# Patient Record
Sex: Male | Born: 1945 | Race: White | Hispanic: No | Marital: Married | Smoking: Former smoker
Health system: Southern US, Community
[De-identification: ages and names within clinical notes are randomized; demographics above are authoritative.]

## PROBLEM LIST (undated history)

## (undated) DIAGNOSIS — J45909 Unspecified asthma, uncomplicated: Secondary | ICD-10-CM

## (undated) DIAGNOSIS — T7840XA Allergy, unspecified, initial encounter: Secondary | ICD-10-CM

## (undated) DIAGNOSIS — G4730 Sleep apnea, unspecified: Secondary | ICD-10-CM

## (undated) DIAGNOSIS — E785 Hyperlipidemia, unspecified: Secondary | ICD-10-CM

## (undated) DIAGNOSIS — I219 Acute myocardial infarction, unspecified: Secondary | ICD-10-CM

## (undated) DIAGNOSIS — J449 Chronic obstructive pulmonary disease, unspecified: Secondary | ICD-10-CM

## (undated) DIAGNOSIS — M1990 Unspecified osteoarthritis, unspecified site: Secondary | ICD-10-CM

## (undated) DIAGNOSIS — C801 Malignant (primary) neoplasm, unspecified: Secondary | ICD-10-CM

## (undated) DIAGNOSIS — I10 Essential (primary) hypertension: Secondary | ICD-10-CM

## (undated) DIAGNOSIS — J189 Pneumonia, unspecified organism: Secondary | ICD-10-CM

## (undated) DIAGNOSIS — Z87442 Personal history of urinary calculi: Secondary | ICD-10-CM

## (undated) DIAGNOSIS — I739 Peripheral vascular disease, unspecified: Secondary | ICD-10-CM

## (undated) DIAGNOSIS — K5792 Diverticulitis of intestine, part unspecified, without perforation or abscess without bleeding: Secondary | ICD-10-CM

## (undated) HISTORY — DX: Hyperlipidemia, unspecified: E78.5

## (undated) HISTORY — DX: Unspecified asthma, uncomplicated: J45.909

## (undated) HISTORY — PX: FOOT SURGERY: SHX648

## (undated) HISTORY — DX: Allergy, unspecified, initial encounter: T78.40XA

## (undated) HISTORY — PX: UPPER GASTROINTESTINAL ENDOSCOPY: SHX188

## (undated) HISTORY — PX: LAPAROSCOPIC TRANSABDOMINAL HERNIA: SHX5933

## (undated) HISTORY — PX: MANDIBLE SURGERY: SHX707

## (undated) HISTORY — DX: Sleep apnea, unspecified: G47.30

## (undated) HISTORY — DX: Acute myocardial infarction, unspecified: I21.9

## (undated) HISTORY — PX: COLON SURGERY: SHX602

## (undated) HISTORY — DX: Chronic obstructive pulmonary disease, unspecified: J44.9

## (undated) HISTORY — DX: Unspecified osteoarthritis, unspecified site: M19.90

## (undated) HISTORY — DX: Diverticulitis of intestine, part unspecified, without perforation or abscess without bleeding: K57.92

## (undated) HISTORY — DX: Essential (primary) hypertension: I10

## (undated) HISTORY — PX: CARDIAC CATHETERIZATION: SHX172

## (undated) HISTORY — PX: COLONOSCOPY: SHX174

## (undated) HISTORY — PX: JOINT REPLACEMENT: SHX530

## (undated) HISTORY — PX: OTHER SURGICAL HISTORY: SHX169

## (undated) HISTORY — PX: CORONARY STENT PLACEMENT: SHX1402

---

## 2016-07-11 DIAGNOSIS — R0789 Other chest pain: Secondary | ICD-10-CM | POA: Diagnosis not present

## 2016-07-11 DIAGNOSIS — K219 Gastro-esophageal reflux disease without esophagitis: Secondary | ICD-10-CM | POA: Diagnosis not present

## 2016-07-11 DIAGNOSIS — K319 Disease of stomach and duodenum, unspecified: Secondary | ICD-10-CM | POA: Diagnosis not present

## 2016-07-11 DIAGNOSIS — K2270 Barrett's esophagus without dysplasia: Secondary | ICD-10-CM | POA: Diagnosis not present

## 2016-07-11 DIAGNOSIS — R079 Chest pain, unspecified: Secondary | ICD-10-CM | POA: Diagnosis not present

## 2016-07-11 DIAGNOSIS — K2970 Gastritis, unspecified, without bleeding: Secondary | ICD-10-CM | POA: Diagnosis not present

## 2016-07-11 DIAGNOSIS — K22719 Barrett's esophagus with dysplasia, unspecified: Secondary | ICD-10-CM | POA: Diagnosis not present

## 2016-09-18 DIAGNOSIS — L570 Actinic keratosis: Secondary | ICD-10-CM | POA: Diagnosis not present

## 2016-09-18 DIAGNOSIS — L821 Other seborrheic keratosis: Secondary | ICD-10-CM | POA: Diagnosis not present

## 2016-09-18 DIAGNOSIS — L400 Psoriasis vulgaris: Secondary | ICD-10-CM | POA: Diagnosis not present

## 2016-11-07 DIAGNOSIS — L821 Other seborrheic keratosis: Secondary | ICD-10-CM | POA: Diagnosis not present

## 2016-11-07 DIAGNOSIS — L570 Actinic keratosis: Secondary | ICD-10-CM | POA: Diagnosis not present

## 2016-12-13 DIAGNOSIS — I2510 Atherosclerotic heart disease of native coronary artery without angina pectoris: Secondary | ICD-10-CM | POA: Diagnosis not present

## 2016-12-26 DIAGNOSIS — N401 Enlarged prostate with lower urinary tract symptoms: Secondary | ICD-10-CM | POA: Diagnosis not present

## 2016-12-26 DIAGNOSIS — N210 Calculus in bladder: Secondary | ICD-10-CM | POA: Diagnosis not present

## 2017-01-07 DIAGNOSIS — R05 Cough: Secondary | ICD-10-CM | POA: Diagnosis not present

## 2017-03-19 DIAGNOSIS — L821 Other seborrheic keratosis: Secondary | ICD-10-CM | POA: Diagnosis not present

## 2017-03-19 DIAGNOSIS — L570 Actinic keratosis: Secondary | ICD-10-CM | POA: Diagnosis not present

## 2017-07-30 DIAGNOSIS — N401 Enlarged prostate with lower urinary tract symptoms: Secondary | ICD-10-CM | POA: Diagnosis not present

## 2017-07-30 DIAGNOSIS — N210 Calculus in bladder: Secondary | ICD-10-CM | POA: Diagnosis not present

## 2017-09-24 DIAGNOSIS — K2270 Barrett's esophagus without dysplasia: Secondary | ICD-10-CM | POA: Diagnosis not present

## 2017-09-24 DIAGNOSIS — Z86010 Personal history of colonic polyps: Secondary | ICD-10-CM | POA: Diagnosis not present

## 2017-09-24 DIAGNOSIS — K219 Gastro-esophageal reflux disease without esophagitis: Secondary | ICD-10-CM | POA: Diagnosis not present

## 2017-10-02 DIAGNOSIS — R072 Precordial pain: Secondary | ICD-10-CM | POA: Diagnosis not present

## 2017-10-14 DIAGNOSIS — I2510 Atherosclerotic heart disease of native coronary artery without angina pectoris: Secondary | ICD-10-CM | POA: Diagnosis not present

## 2017-10-14 DIAGNOSIS — Z955 Presence of coronary angioplasty implant and graft: Secondary | ICD-10-CM | POA: Diagnosis not present

## 2017-10-14 DIAGNOSIS — Z79899 Other long term (current) drug therapy: Secondary | ICD-10-CM | POA: Diagnosis not present

## 2017-10-14 DIAGNOSIS — J45909 Unspecified asthma, uncomplicated: Secondary | ICD-10-CM | POA: Diagnosis not present

## 2017-10-14 DIAGNOSIS — Z7982 Long term (current) use of aspirin: Secondary | ICD-10-CM | POA: Diagnosis not present

## 2017-10-14 DIAGNOSIS — R079 Chest pain, unspecified: Secondary | ICD-10-CM | POA: Diagnosis not present

## 2017-10-14 DIAGNOSIS — R0789 Other chest pain: Secondary | ICD-10-CM | POA: Diagnosis not present

## 2017-10-14 DIAGNOSIS — E782 Mixed hyperlipidemia: Secondary | ICD-10-CM | POA: Diagnosis not present

## 2017-10-14 DIAGNOSIS — R05 Cough: Secondary | ICD-10-CM | POA: Diagnosis not present

## 2017-10-14 DIAGNOSIS — Z87891 Personal history of nicotine dependence: Secondary | ICD-10-CM | POA: Diagnosis not present

## 2017-10-17 DIAGNOSIS — K219 Gastro-esophageal reflux disease without esophagitis: Secondary | ICD-10-CM | POA: Diagnosis not present

## 2017-10-17 DIAGNOSIS — Z79899 Other long term (current) drug therapy: Secondary | ICD-10-CM | POA: Diagnosis not present

## 2017-10-17 DIAGNOSIS — I2510 Atherosclerotic heart disease of native coronary artery without angina pectoris: Secondary | ICD-10-CM | POA: Diagnosis not present

## 2017-10-17 DIAGNOSIS — K2270 Barrett's esophagus without dysplasia: Secondary | ICD-10-CM | POA: Diagnosis not present

## 2017-10-17 DIAGNOSIS — E782 Mixed hyperlipidemia: Secondary | ICD-10-CM | POA: Diagnosis not present

## 2017-10-17 DIAGNOSIS — Z87891 Personal history of nicotine dependence: Secondary | ICD-10-CM | POA: Diagnosis not present

## 2017-10-17 DIAGNOSIS — K210 Gastro-esophageal reflux disease with esophagitis: Secondary | ICD-10-CM | POA: Diagnosis not present

## 2017-10-17 DIAGNOSIS — J45909 Unspecified asthma, uncomplicated: Secondary | ICD-10-CM | POA: Diagnosis not present

## 2017-10-17 DIAGNOSIS — K2950 Unspecified chronic gastritis without bleeding: Secondary | ICD-10-CM | POA: Diagnosis not present

## 2017-12-02 DIAGNOSIS — G4733 Obstructive sleep apnea (adult) (pediatric): Secondary | ICD-10-CM | POA: Diagnosis not present

## 2017-12-02 DIAGNOSIS — K2270 Barrett's esophagus without dysplasia: Secondary | ICD-10-CM | POA: Diagnosis not present

## 2017-12-02 DIAGNOSIS — J45909 Unspecified asthma, uncomplicated: Secondary | ICD-10-CM | POA: Diagnosis not present

## 2017-12-19 DIAGNOSIS — G4733 Obstructive sleep apnea (adult) (pediatric): Secondary | ICD-10-CM | POA: Diagnosis not present

## 2017-12-19 DIAGNOSIS — R0683 Snoring: Secondary | ICD-10-CM | POA: Diagnosis not present

## 2017-12-19 DIAGNOSIS — I491 Atrial premature depolarization: Secondary | ICD-10-CM | POA: Diagnosis not present

## 2017-12-19 DIAGNOSIS — G4761 Periodic limb movement disorder: Secondary | ICD-10-CM | POA: Diagnosis not present

## 2017-12-19 DIAGNOSIS — G4710 Hypersomnia, unspecified: Secondary | ICD-10-CM | POA: Diagnosis not present

## 2017-12-19 DIAGNOSIS — G478 Other sleep disorders: Secondary | ICD-10-CM | POA: Diagnosis not present

## 2017-12-26 DIAGNOSIS — G4761 Periodic limb movement disorder: Secondary | ICD-10-CM | POA: Diagnosis not present

## 2017-12-26 DIAGNOSIS — R0683 Snoring: Secondary | ICD-10-CM | POA: Diagnosis not present

## 2017-12-26 DIAGNOSIS — G4733 Obstructive sleep apnea (adult) (pediatric): Secondary | ICD-10-CM | POA: Diagnosis not present

## 2017-12-26 DIAGNOSIS — G478 Other sleep disorders: Secondary | ICD-10-CM | POA: Diagnosis not present

## 2018-01-02 DIAGNOSIS — G478 Other sleep disorders: Secondary | ICD-10-CM | POA: Diagnosis not present

## 2018-01-02 DIAGNOSIS — I491 Atrial premature depolarization: Secondary | ICD-10-CM | POA: Diagnosis not present

## 2018-01-02 DIAGNOSIS — G4733 Obstructive sleep apnea (adult) (pediatric): Secondary | ICD-10-CM | POA: Diagnosis not present

## 2018-01-02 DIAGNOSIS — G4710 Hypersomnia, unspecified: Secondary | ICD-10-CM | POA: Diagnosis not present

## 2018-01-02 DIAGNOSIS — G4761 Periodic limb movement disorder: Secondary | ICD-10-CM | POA: Diagnosis not present

## 2018-01-02 DIAGNOSIS — R0683 Snoring: Secondary | ICD-10-CM | POA: Diagnosis not present

## 2018-01-08 DIAGNOSIS — G4761 Periodic limb movement disorder: Secondary | ICD-10-CM | POA: Diagnosis not present

## 2018-01-08 DIAGNOSIS — G4733 Obstructive sleep apnea (adult) (pediatric): Secondary | ICD-10-CM | POA: Diagnosis not present

## 2018-01-08 DIAGNOSIS — G478 Other sleep disorders: Secondary | ICD-10-CM | POA: Diagnosis not present

## 2018-01-08 DIAGNOSIS — I491 Atrial premature depolarization: Secondary | ICD-10-CM | POA: Diagnosis not present

## 2018-01-27 DIAGNOSIS — E669 Obesity, unspecified: Secondary | ICD-10-CM | POA: Diagnosis not present

## 2018-01-27 DIAGNOSIS — I10 Essential (primary) hypertension: Secondary | ICD-10-CM | POA: Diagnosis not present

## 2018-01-27 DIAGNOSIS — E782 Mixed hyperlipidemia: Secondary | ICD-10-CM | POA: Diagnosis not present

## 2018-01-27 DIAGNOSIS — I2510 Atherosclerotic heart disease of native coronary artery without angina pectoris: Secondary | ICD-10-CM | POA: Diagnosis not present

## 2018-02-03 DIAGNOSIS — R972 Elevated prostate specific antigen [PSA]: Secondary | ICD-10-CM | POA: Diagnosis not present

## 2018-02-03 DIAGNOSIS — N401 Enlarged prostate with lower urinary tract symptoms: Secondary | ICD-10-CM | POA: Diagnosis not present

## 2018-02-03 DIAGNOSIS — N210 Calculus in bladder: Secondary | ICD-10-CM | POA: Diagnosis not present

## 2018-02-11 DIAGNOSIS — K2270 Barrett's esophagus without dysplasia: Secondary | ICD-10-CM | POA: Diagnosis not present

## 2018-02-11 DIAGNOSIS — I2510 Atherosclerotic heart disease of native coronary artery without angina pectoris: Secondary | ICD-10-CM | POA: Diagnosis not present

## 2018-02-11 DIAGNOSIS — G4733 Obstructive sleep apnea (adult) (pediatric): Secondary | ICD-10-CM | POA: Diagnosis not present

## 2018-02-11 DIAGNOSIS — J45909 Unspecified asthma, uncomplicated: Secondary | ICD-10-CM | POA: Diagnosis not present

## 2018-03-12 DIAGNOSIS — G4733 Obstructive sleep apnea (adult) (pediatric): Secondary | ICD-10-CM | POA: Diagnosis not present

## 2018-03-12 DIAGNOSIS — I2510 Atherosclerotic heart disease of native coronary artery without angina pectoris: Secondary | ICD-10-CM | POA: Diagnosis not present

## 2018-03-12 DIAGNOSIS — K2270 Barrett's esophagus without dysplasia: Secondary | ICD-10-CM | POA: Diagnosis not present

## 2018-03-12 DIAGNOSIS — J45909 Unspecified asthma, uncomplicated: Secondary | ICD-10-CM | POA: Diagnosis not present

## 2018-06-19 DIAGNOSIS — G4710 Hypersomnia, unspecified: Secondary | ICD-10-CM | POA: Diagnosis not present

## 2018-06-19 DIAGNOSIS — G4761 Periodic limb movement disorder: Secondary | ICD-10-CM | POA: Diagnosis not present

## 2018-06-19 DIAGNOSIS — G4733 Obstructive sleep apnea (adult) (pediatric): Secondary | ICD-10-CM | POA: Diagnosis not present

## 2018-06-24 DIAGNOSIS — G4761 Periodic limb movement disorder: Secondary | ICD-10-CM | POA: Diagnosis not present

## 2018-06-24 DIAGNOSIS — G4733 Obstructive sleep apnea (adult) (pediatric): Secondary | ICD-10-CM | POA: Diagnosis not present

## 2018-09-23 ENCOUNTER — Ambulatory Visit (INDEPENDENT_AMBULATORY_CARE_PROVIDER_SITE_OTHER): Payer: Federal, State, Local not specified - PPO | Admitting: Family Medicine

## 2018-09-23 ENCOUNTER — Encounter: Payer: Self-pay | Admitting: Family Medicine

## 2018-09-23 DIAGNOSIS — J4540 Moderate persistent asthma, uncomplicated: Secondary | ICD-10-CM

## 2018-09-23 DIAGNOSIS — N401 Enlarged prostate with lower urinary tract symptoms: Secondary | ICD-10-CM | POA: Diagnosis not present

## 2018-09-23 DIAGNOSIS — J45909 Unspecified asthma, uncomplicated: Secondary | ICD-10-CM | POA: Insufficient documentation

## 2018-09-23 DIAGNOSIS — I2510 Atherosclerotic heart disease of native coronary artery without angina pectoris: Secondary | ICD-10-CM | POA: Diagnosis not present

## 2018-09-23 DIAGNOSIS — G4733 Obstructive sleep apnea (adult) (pediatric): Secondary | ICD-10-CM

## 2018-09-23 DIAGNOSIS — I10 Essential (primary) hypertension: Secondary | ICD-10-CM | POA: Diagnosis not present

## 2018-09-23 DIAGNOSIS — N400 Enlarged prostate without lower urinary tract symptoms: Secondary | ICD-10-CM | POA: Insufficient documentation

## 2018-09-23 MED ORDER — OMEPRAZOLE MAGNESIUM 20 MG PO TBEC: 40.0000 mg | DELAYED_RELEASE_TABLET | Freq: Every day | ORAL | 1 refills | Status: DC

## 2018-09-23 MED ORDER — ALBUTEROL SULFATE HFA 108 (90 BASE) MCG/ACT IN AERS: 1.0000 | INHALATION_SPRAY | Freq: Four times a day (QID) | RESPIRATORY_TRACT | 12 refills | Status: DC | PRN

## 2018-09-23 MED ORDER — FLUTICASONE FUROATE-VILANTEROL 100-25 MCG/INH IN AEPB: 1.0000 | INHALATION_SPRAY | Freq: Every day | RESPIRATORY_TRACT | 12 refills | Status: DC

## 2018-09-23 MED ORDER — ROSUVASTATIN CALCIUM 20 MG PO TABS: 20.0000 mg | ORAL_TABLET | Freq: Every day | ORAL | 1 refills | Status: DC

## 2018-09-23 MED ORDER — MONTELUKAST SODIUM 10 MG PO TABS: 10.0000 mg | ORAL_TABLET | Freq: Every day | ORAL | 1 refills | Status: DC

## 2018-09-23 MED ORDER — METOPROLOL TARTRATE 25 MG PO TABS: 25.0000 mg | ORAL_TABLET | Freq: Two times a day (BID) | ORAL | 1 refills | Status: DC

## 2018-09-23 MED ORDER — FINASTERIDE 5 MG PO TABS: 5.0000 mg | ORAL_TABLET | Freq: Every day | ORAL | 1 refills | Status: DC

## 2018-09-23 MED ORDER — LISINOPRIL 5 MG PO TABS: 5.0000 mg | ORAL_TABLET | Freq: Every day | ORAL | 1 refills | Status: DC

## 2018-09-23 MED ORDER — NITROGLYCERIN 0.4 MG SL SUBL: 0.4000 mg | SUBLINGUAL_TABLET | SUBLINGUAL | 0 refills | Status: DC | PRN

## 2018-09-23 MED ORDER — TAMSULOSIN HCL 0.4 MG PO CAPS: 0.4000 mg | ORAL_CAPSULE | Freq: Every day | ORAL | 1 refills | Status: DC

## 2018-09-23 NOTE — Assessment & Plan Note (Signed)
-  Needs referral to pulmonology for equipment

## 2018-09-23 NOTE — Assessment & Plan Note (Signed)
-  BP well controlled with current medications, will plan to continue -Continue healthy lifestyle with regular exercise.

## 2018-09-23 NOTE — Assessment & Plan Note (Addendum)
-  Stable, denies anginal symptoms.  -Continue statin, asa and beta blocker.  -Requests to establish with cardiology here, referral placed.

## 2018-09-23 NOTE — Assessment & Plan Note (Signed)
-  Reports recent psa  Of 1.8 -Requests referral to local urologist to discuss surgical options for mgmt of bph.

## 2018-09-23 NOTE — Progress Notes (Signed)
Alex Boyle - 73 y.o. male MRN [id number]  Date of birth: 12-30-1945   This visit type was conducted due to national recommendations for restrictions regarding the COVID-19 Pandemic (e.g. social distancing).  This format is felt to be most appropriate for this patient at this time.  All issues noted in this document were discussed and addressed.  No physical exam was performed (except for noted visual exam findings with Video Visits).  I discussed the limitations of evaluation and management by telemedicine and the availability of in person appointments. The patient expressed understanding and agreed to proceed.  I connected with@ on 09/23/18 at  8:30 AM EDT by a video enabled telemedicine application and verified that I am speaking with the correct person using two identifiers.   Patient Location: [facility] [place] [address]   Provider location:   Home office  Chief Complaint  Patient presents with  . Establish Care    Refill Meds- HTN/BPH/Asthma    HPI  Alex Boyle is a 73 y.o. male who presents via audio/video conferencing for a telehealth visit today.  He is presenting for initial visit with pcp.  He recently relocated to this area from [place].  He has a history of HTN, CAD with history of MI s/p stenting x2, diverticulitis, BPH and asthma. He was being followed by cardiology, pulmonology and urology while living in [facility] and would like to establish with specialists here. He is needing medication refills as well.   -HTN:  Currently managed with lisinopril and metoprolol.  He is doing well with current medication and denies side effects.  He exercises regularly and does yoga.    -CAD:  History of MI, denies any recent anginal symptoms.  He is on statin and asa.  BP has been well controlled.   -BPH:  Current tx with finasteride and tamsulosin.  Urolift procedure discussed prior to moving to [place].  Would like to see urologist here to discuss surgical  options as well.  Symptoms are fairly well managed with current medications.   -Asthma: Using breo, singulair and albuterol.  Symptoms well managed with combination of medications.  Also has OSA, was working with pulmonologist on getting this treated but may need BiPAP, needs referral back to pulm.     ROS:  A comprehensive ROS was completed and negative except as noted per HPI  Past Medical History:  Diagnosis Date  . Allergy   . Arthritis   . Asthma   . Diverticulitis   . Hyperlipidemia   . Hypertension   . Myocardial infarction ([place])   . Sleep apnea   . Sleep apnea in adult     Past Surgical History:  Procedure Laterality Date  . COLON SURGERY    . CORONARY STENT PLACEMENT     x2  . JOINT REPLACEMENT     knee  . LAPAROSCOPIC TRANSABDOMINAL HERNIA    . MANDIBLE SURGERY    . skin cancer     basil cell removed on back    Family History  Adopted: Yes  Problem Relation Age of Onset  . Heart disease Mother   . Hypertension Mother   . Arthritis Mother   . Alzheimer's disease Mother     Social History   Socioeconomic History  . Marital status: Married    Spouse name: Not on file  . Number of children: Not on file  . Years of education: Not on file  . Highest education level: Not on  file  Occupational History  . Not on file  Social Needs  . Financial resource strain: Not hard at all  . Food insecurity:    Worry: Never true    Inability: Never true  . Transportation needs:    Medical: No    Non-medical: No  Tobacco Use  . Smoking status: Never Smoker  . Smokeless tobacco: Never Used  Substance and Sexual Activity  . Alcohol use: Yes    Comment: soically   . Drug use: Never  . Sexual activity: Yes    Birth control/protection: None  Lifestyle  . Physical activity:    Days per week: 4 days    Minutes per session: 30 min  . Stress: Not at all  Relationships  . Social connections:    Talks on phone: Not on file    Gets together: Not on file    Attends  religious service: Not on file    Active member of club or organization: Not on file    Attends meetings of clubs or organizations: Not on file    Relationship status: Not on file  . Intimate partner violence:    Fear of current or ex partner: Not on file    Emotionally abused: Not on file    Physically abused: Not on file    Forced sexual activity: Not on file  Other Topics Concern  . Not on file  Social History Narrative  . Not on file    No current outpatient medications on file.  EXAM:  VITALS per patient if applicable: BP 329/92 Comment: pt reports from hm  Temp (!) 96.2 F (35.7 C) (Oral) Comment (Src): pt reports from hm.  Ht 6' (1.829 m)   Wt 222 lb (100.7 kg) Comment: pt reports from hm  BMI 30.11 kg/m   GENERAL: alert, oriented, appears well and in no acute distress  HEENT: atraumatic, conjunttiva clear, no obvious abnormalities on inspection of external nose and ears  NECK: normal movements of the head and neck  LUNGS: on inspection no signs of respiratory distress, breathing rate appears normal, no obvious gross SOB, gasping or wheezing  CV: no obvious cyanosis  MS: moves all visible extremities without noticeable abnormality  PSYCH/NEURO: pleasant and cooperative, no obvious depression or anxiety, speech and thought processing grossly intact  ASSESSMENT AND PLAN:  Discussed the following assessment and plan:  HTN (hypertension) -BP well controlled with current medications, will plan to continue -Continue healthy lifestyle with regular exercise.   CAD (coronary artery disease) -Stable, denies anginal symptoms.  -Continue statin, asa and beta blocker.  -Requests to establish with cardiology here, referral placed.   Asthma -Well controlled, continue current medications.    OSA (obstructive sleep apnea) -Needs referral to pulmonology for equipment  BPH (benign prostatic hyperplasia) -Reports recent psa  Of 1.8 -Requests referral to local  urologist to discuss surgical options for mgmt of bph.        I discussed the assessment and treatment plan with the patient. The patient was provided an opportunity to ask questions and all were answered. The patient agreed with the plan and demonstrated an understanding of the instructions.   The patient was advised to call back or seek an in-person evaluation if the symptoms worsen or if the condition fails to improve as anticipated.     Luetta Nutting, DO

## 2018-09-23 NOTE — Assessment & Plan Note (Signed)
Well-controlled, continue current medications

## 2018-09-30 ENCOUNTER — Telehealth: Payer: Self-pay | Admitting: General Practice

## 2018-09-30 NOTE — Telephone Encounter (Signed)
error 

## 2018-10-06 ENCOUNTER — Ambulatory Visit: Payer: Self-pay | Admitting: Family Medicine

## 2018-10-07 ENCOUNTER — Ambulatory Visit: Payer: Federal, State, Local not specified - PPO | Admitting: Family Medicine

## 2018-10-10 ENCOUNTER — Telehealth: Payer: Self-pay

## 2018-10-10 NOTE — Telephone Encounter (Signed)

## 2018-10-13 ENCOUNTER — Telehealth (INDEPENDENT_AMBULATORY_CARE_PROVIDER_SITE_OTHER): Payer: Federal, State, Local not specified - PPO | Admitting: Cardiology

## 2018-10-13 ENCOUNTER — Other Ambulatory Visit: Payer: Self-pay

## 2018-10-13 VITALS — BP 116/69 | HR 71 | Ht 72.0 in

## 2018-10-13 DIAGNOSIS — E785 Hyperlipidemia, unspecified: Secondary | ICD-10-CM | POA: Diagnosis not present

## 2018-10-13 DIAGNOSIS — I10 Essential (primary) hypertension: Secondary | ICD-10-CM

## 2018-10-13 DIAGNOSIS — I2510 Atherosclerotic heart disease of native coronary artery without angina pectoris: Secondary | ICD-10-CM

## 2018-10-13 MED ORDER — ASPIRIN EC 81 MG PO TBEC: 81.0000 mg | DELAYED_RELEASE_TABLET | Freq: Every day | ORAL | 3 refills | Status: DC

## 2018-10-13 NOTE — Patient Instructions (Addendum)
Medication Instructions:  1) RESTART ASPIRIN 81 mg daily     Labwork: Your provider recommends that you return for FASTING lab work in June.  Follow-Up: Your provider wants you to follow-up in: 6 months with Dr. Radford Pax or her assistant. You will receive a reminder letter in the mail two months in advance. If you don't receive a letter, please call our office to schedule the follow-up appointment.

## 2018-10-13 NOTE — Progress Notes (Signed)
Virtual Visit via Video Note   This visit type was conducted due to national recommendations for restrictions regarding the COVID-19 Pandemic (e.g. social distancing) in an effort to limit this patient's exposure and mitigate transmission in our community.  Due to his co-morbid illnesses, this patient is at least at moderate risk for complications without adequate follow up.  This format is felt to be most appropriate for this patient at this time.  All issues noted in this document were discussed and addressed.  A limited physical exam was performed with this format.  Please refer to the patient's chart for his consent to telehealth for [facility].   Evaluation Performed:  Cardiology Consult  This visit type was conducted due to national recommendations for restrictions regarding the COVID-19 Pandemic (e.g. social distancing).  This format is felt to be most appropriate for this patient at this time.  All issues noted in this document were discussed and addressed.  No physical exam was performed (except for noted visual exam findings with Video Visits).  Please refer to the patient's chart (MyChart message for video visits and phone note for telephone visits) for the patient's consent to telehealth for [facility]  Date:  10/13/2018   ID:  Alex Boyle, DOB 1946/01/18, MRN [id number]  Patient Location:  Home  Provider location:   [place]  PCP:  Luetta Nutting, DO  Cardiologist:  Fransico Him, MD Electrophysiologist:  None   Chief Complaint:  CAD  History of Present Illness:    Alex Boyle is a 73 y.o. male who presents via audio/video conferencing for a telehealth visit today in referral by Dr. Luetta Nutting for establishing care with Cardiology for CAD.  THis is a 73yo male with a history of ASCAD s/p remote MI with PCI x 2, HTN, hyperlipidemia, OSA and asthma.  He recently moved here from [facility].  He is s/p remote MI in 2012 at which time he had total occlusion of the  LAD and underwent PCI.  He was also noted to have high-grade lesion in his left circumflex but was medically managed.  Several months later he had a follow-up nuclear stress test and collapsed on the treadmill.  He underwent PCI of the left circumflex.  His last cath was October 2016 which showed widely patent stents and no other CAD.  He is here today for followup and is doing well.  He denies any chest pain or pressure, SOB, DOE, PND, orthopnea, dizziness, palpitations or syncope.  He occasionally has some mild lower extremity edema he is compliant with his meds and is tolerating meds with no SE.    The patient does not have symptoms concerning for COVID-19 infection (fever, chills, cough, or new shortness of breath).    Prior CV studies:   The following studies were reviewed today:  none  Past Medical History:  Diagnosis Date  . Allergy   . Arthritis   . Asthma   . Diverticulitis   . Hyperlipidemia   . Hypertension   . Myocardial infarction ([place])   . Sleep apnea   . Sleep apnea in adult    Past Surgical History:  Procedure Laterality Date  . COLON SURGERY    . CORONARY STENT PLACEMENT     x2  . JOINT REPLACEMENT     knee  . LAPAROSCOPIC TRANSABDOMINAL HERNIA    . MANDIBLE SURGERY    . skin cancer     basil cell removed on back     Current Meds  Medication Sig  . albuterol (PROVENTIL HFA;VENTOLIN HFA) 108 (90 Base) MCG/ACT inhaler Inhale 1-2 puffs into the lungs every 6 (six) hours as needed for wheezing or shortness of breath.  . finasteride (PROSCAR) 5 MG tablet Take 1 tablet (5 mg total) by mouth daily.  . fluticasone furoate-vilanterol (BREO ELLIPTA) 100-25 MCG/INH AEPB Inhale 1 puff into the lungs daily.  Marland Kitchen lisinopril (PRINIVIL,ZESTRIL) 5 MG tablet Take 1 tablet (5 mg total) by mouth daily.  . metoprolol tartrate (LOPRESSOR) 25 MG tablet Take 1 tablet (25 mg total) by mouth 2 (two) times daily.  . montelukast (SINGULAIR) 10 MG tablet Take 1 tablet (10 mg total) by  mouth at bedtime.  . nitroGLYCERIN (NITROSTAT) 0.4 MG SL tablet Place 1 tablet (0.4 mg total) under the tongue every 5 (five) minutes as needed for chest pain (If no relief after 3 doses call 911).  Marland Kitchen omeprazole (PRILOSEC OTC) 20 MG tablet Take 2 tablets (40 mg total) by mouth daily.  . rosuvastatin (CRESTOR) 20 MG tablet Take 1 tablet (20 mg total) by mouth daily.  . tamsulosin (FLOMAX) 0.4 MG CAPS capsule Take 1 capsule (0.4 mg total) by mouth daily.     Allergies:   Codeine and Penicillins   Social History   Tobacco Use  . Smoking status: Never Smoker  . Smokeless tobacco: Never Used  Substance Use Topics  . Alcohol use: Yes    Comment: soically   . Drug use: Never     Family Hx: The patient's family history includes Alzheimer's disease in his mother; Arthritis in his mother; Heart disease in his mother; Hypertension in his mother. He was adopted.  ROS:   Please see the history of present illness.     All other systems reviewed and are negative.   Labs/Other Tests and Data Reviewed:    Recent Labs: No results found for requested labs within last 8760 hours.   Recent Lipid Panel No results found for: CHOL, TRIG, HDL, CHOLHDL, LDLCALC, LDLDIRECT  Wt Readings from Last 3 Encounters:  09/23/18 222 lb (100.7 kg)     Objective:    Vital Signs:  BP 116/69   Pulse 71   Ht 6' (1.829 m)   BMI 30.11 kg/m    CONSTITUTIONAL:  Well nourished, well developed male in no acute distress.  EYES: anicteric MOUTH: oral mucosa is pink RESPIRATORY: Normal respiratory effort, symmetric expansion CARDIOVASCULAR: No peripheral edema SKIN: No rash, lesions or ulcers MUSCULOSKELETAL: no digital cyanosis NEURO: Cranial Nerves II-XII grossly intact, moves all extremities PSYCH: Intact judgement and insight.  A&O x 3, Mood/affect appropriate   ASSESSMENT & PLAN:    1.  ASCAD -he is status post remote MI in 2012 at which time he had total occlusion of the LAD and underwent PCI.  He  was also noted to have high-grade lesion in his left circumflex but was medically managed.  Several months later he had a follow-up nuclear stress test and collapsed on the treadmill.  He underwent PCI of the left circumflex.  His last cath was October 2016 which showed widely patent stents and no other CAD.  He has no anginal symptoms at present.  He had stopped taking aspirin a year ago because of bruising but I recommended he restart a baby aspirin 81 mg daily.  He will continue on statin and beta-blocker.  2.  Hypertension -his blood pressure is well controlled on exam today.  He will continue on Lopressor 25 mg twice daily and lisinopril 5  mg daily.  I will check a BMET  in June.  3.  Hyperlipidemia - His LDL goal is < 70.  He has not had an fasting lipid panel done in over a year.  I will check an FLP and ALT in June.  He will continue on Crestor 20 mg daily.  4.  COVID-19 Education:The signs and symptoms of COVID-19 were discussed with the patient and how to seek care for testing (follow up with PCP or arrange E-visit).  The importance of social distancing was discussed today.  Patient Risk:   After full review of this patient's clinical status, I feel that they are at least moderate risk at this time.  Time:   Today, I have spent 20 minutes directly with the patient on video discussing medical problems including CAD, HTN, lipids.  We also reviewed the symptoms of COVID 19 and the ways to protect against contracting the virus with telehealth technology.  I spent an additional 5 minutes reviewing patient's chart including OV notes from PCP.  Medication Adjustments/Labs and Tests Ordered: Current medicines are reviewed at length with the patient today.  Concerns regarding medicines are outlined above.  Tests Ordered: No orders of the defined types were placed in this encounter.  Medication Changes: No orders of the defined types were placed in this encounter.   Disposition:  Follow up in  6 month(s)  Signed, Fransico Him, MD  10/13/2018 8:03 AM    [facility]

## 2018-10-30 ENCOUNTER — Telehealth: Payer: Self-pay | Admitting: Family Medicine

## 2018-10-30 NOTE — Telephone Encounter (Signed)
Called and could not leave a vm. Calling to schedule patient for lab appt.

## 2018-10-30 NOTE — Telephone Encounter (Signed)
Copied from [employer] [id number]. Topic: General - Inquiry >> Oct 29, 2018  1:19 PM Mathis Bud wrote: Reason for CRM: Patient would like to see when he can come in for his labs.  He saw PCP 4/14, patient states would like to move forward if patients are coming to the office.  Call back # [phone] >> Oct 29, 2018  1:23 PM Noitamyae, Phetcharat, LPN wrote: pls help call the pt and schedule lab, order in system form Dr. Zigmund Daniel already.

## 2018-10-30 NOTE — Addendum Note (Signed)
Addended by: Lynnea Ferrier on: 10/30/2018 01:42 PM   Modules accepted: Orders

## 2018-10-31 ENCOUNTER — Other Ambulatory Visit (INDEPENDENT_AMBULATORY_CARE_PROVIDER_SITE_OTHER): Payer: Federal, State, Local not specified - PPO

## 2018-10-31 DIAGNOSIS — I10 Essential (primary) hypertension: Secondary | ICD-10-CM | POA: Diagnosis not present

## 2018-10-31 DIAGNOSIS — I2510 Atherosclerotic heart disease of native coronary artery without angina pectoris: Secondary | ICD-10-CM | POA: Diagnosis not present

## 2018-11-01 LAB — CBC WITH DIFFERENTIAL/PLATELET
Absolute Monocytes: 587 cells/uL (ref 200–950)
Basophils Absolute: 41 cells/uL (ref 0–200)
Basophils Relative: 0.6 %
Eosinophils Absolute: 207 cells/uL (ref 15–500)
Eosinophils Relative: 3 %
HCT: 48 % (ref 38.5–50.0)
Hemoglobin: 16.4 g/dL (ref 13.2–17.1)
Lymphs Abs: 1932 cells/uL (ref 850–3900)
MCH: 30.1 pg (ref 27.0–33.0)
MCHC: 34.2 g/dL (ref 32.0–36.0)
MCV: 88.2 fL (ref 80.0–100.0)
MPV: 10.6 fL (ref 7.5–12.5)
Monocytes Relative: 8.5 %
Neutro Abs: 4133 cells/uL (ref 1500–7800)
Neutrophils Relative %: 59.9 %
Platelets: 193 10*3/uL (ref 140–400)
RBC: 5.44 10*6/uL (ref 4.20–5.80)
RDW: 13.2 % (ref 11.0–15.0)
Total Lymphocyte: 28 %
WBC: 6.9 10*3/uL (ref 3.8–10.8)

## 2018-11-01 LAB — HEPATIC FUNCTION PANEL
AG Ratio: 1.7 (calc) (ref 1.0–2.5)
ALT: 30 U/L (ref 9–46)
AST: 24 U/L (ref 10–35)
Albumin: 4.4 g/dL (ref 3.6–5.1)
Alkaline phosphatase (APISO): 59 U/L (ref 35–144)
Bilirubin, Direct: 0.1 mg/dL (ref 0.0–0.2)
Globulin: 2.6 g/dL (calc) (ref 1.9–3.7)
Indirect Bilirubin: 0.4 mg/dL (calc) (ref 0.2–1.2)
Total Bilirubin: 0.5 mg/dL (ref 0.2–1.2)
Total Protein: 7 g/dL (ref 6.1–8.1)

## 2018-11-01 LAB — LIPID PANEL
Cholesterol: 140 mg/dL (ref <200)
HDL: 38 mg/dL — ABNORMAL LOW (ref >=40)
LDL Cholesterol (Calc): 79 mg/dL (calc)
Non-HDL Cholesterol (Calc): 102 mg/dL (calc) (ref <130)
Total CHOL/HDL Ratio: 3.7 (calc) (ref <5.0)
Triglycerides: 134 mg/dL (ref <150)

## 2018-11-01 LAB — BASIC METABOLIC PANEL
BUN: 10 mg/dL (ref 7–25)
CO2: 29 mmol/L (ref 20–32)
Calcium: 9.6 mg/dL (ref 8.6–10.3)
Chloride: 99 mmol/L (ref 98–110)
Creat: 0.77 mg/dL (ref 0.70–1.18)
Glucose, Bld: 98 mg/dL (ref 65–99)
Potassium: 4.2 mmol/L (ref 3.5–5.3)
Sodium: 139 mmol/L (ref 135–146)

## 2018-11-01 LAB — TSH: TSH: 2.3 mIU/L (ref 0.40–4.50)

## 2018-11-14 ENCOUNTER — Encounter: Payer: Self-pay | Admitting: Family Medicine

## 2018-11-14 ENCOUNTER — Ambulatory Visit (INDEPENDENT_AMBULATORY_CARE_PROVIDER_SITE_OTHER): Payer: Federal, State, Local not specified - PPO

## 2018-11-14 ENCOUNTER — Telehealth (INDEPENDENT_AMBULATORY_CARE_PROVIDER_SITE_OTHER): Payer: Federal, State, Local not specified - PPO | Admitting: Family Medicine

## 2018-11-14 ENCOUNTER — Other Ambulatory Visit: Payer: Self-pay

## 2018-11-14 VITALS — BP 117/16 | HR 69 | Temp 97.9°F | Ht 73.0 in | Wt 222.0 lb

## 2018-11-14 DIAGNOSIS — R109 Unspecified abdominal pain: Secondary | ICD-10-CM | POA: Insufficient documentation

## 2018-11-14 LAB — POCT URINALYSIS DIPSTICK
Bilirubin, UA: NEGATIVE
Blood, UA: NEGATIVE
Glucose, UA: NEGATIVE
Ketones, UA: NEGATIVE
Leukocytes, UA: NEGATIVE
Nitrite, UA: NEGATIVE
Protein, UA: NEGATIVE
Spec Grav, UA: 1.01 (ref 1.010–1.025)
Urobilinogen, UA: 0.2 E.U./dL
pH, UA: 7.5 (ref 5.0–8.0)

## 2018-11-14 IMAGING — DX ABDOMEN - 1 VIEW
1 series · 1 of 1 positions shown · non-contrast
Comparison: None.

CLINICAL DATA: Flank pain and history of kidney stones.

EXAM:
ABDOMEN - 1 VIEW

[abdomen supine ap]
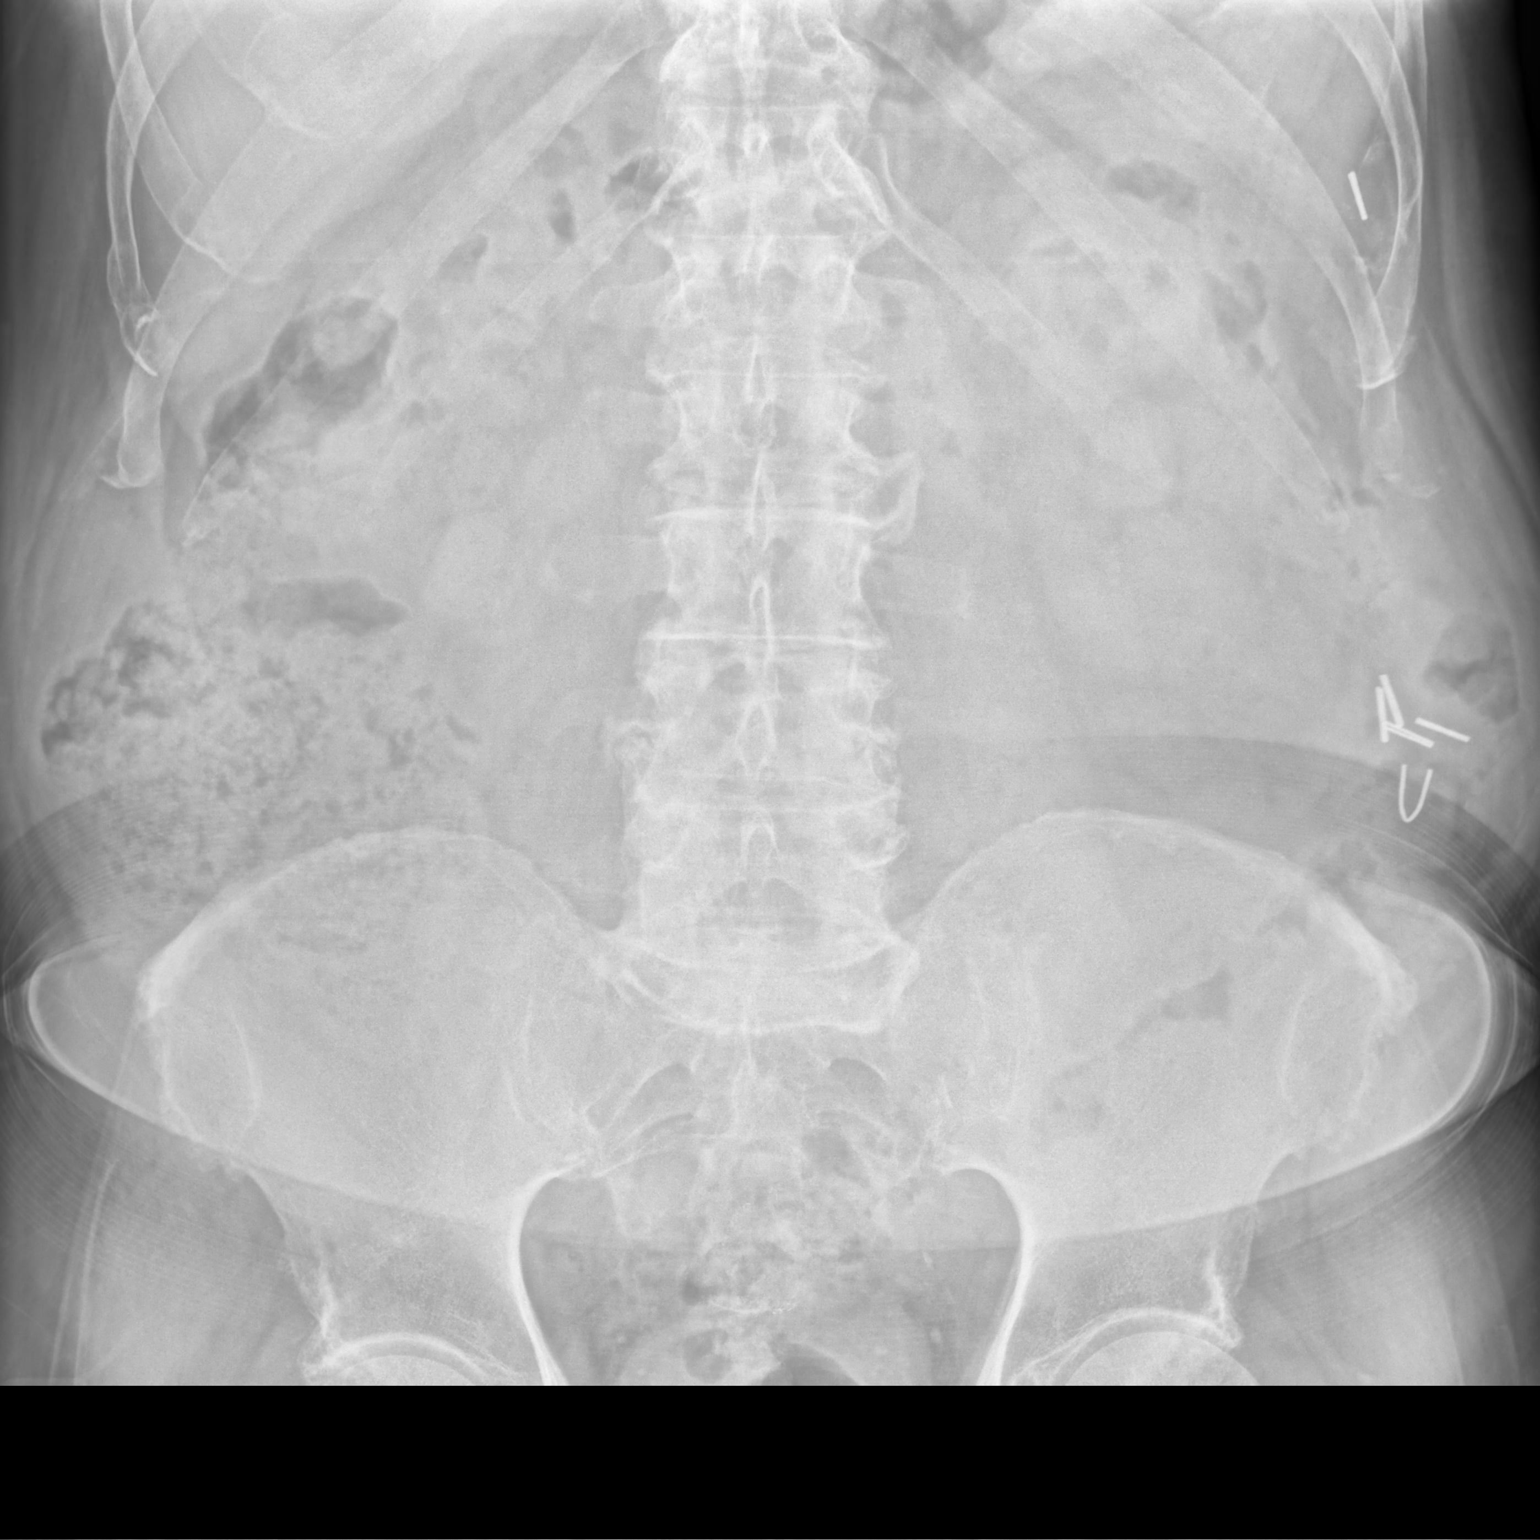

[1 of 1 positions shown; findings below may reference images not displayed]

FINDINGS: Single supine view of the abdomen and pelvis. Large colonic stool
burden, especially within the ascending colon. No abnormal
calcifications over the kidneys or expected course of the abdominal
ureters. Presumed phleboliths within the pelvis. Non-obstructive
bowel gas pattern.
IMPRESSION: No acute findings.

Possible constipation.

## 2018-11-14 NOTE — Progress Notes (Signed)
Alex Boyle - 73 y.o. male MRN [id number]  Date of birth: 16-May-1946   This visit type was conducted due to national recommendations for restrictions regarding the COVID-19 Pandemic (e.g. social distancing).  This format is felt to be most appropriate for this patient at this time.  All issues noted in this document were discussed and addressed.  No physical exam was performed (except for noted visual exam findings with Video Visits).  I discussed the limitations of evaluation and management by telemedicine and the availability of in person appointments. The patient expressed understanding and agreed to proceed.  I connected with@ on 11/14/18 at 10:30 AM EDT by a video enabled telemedicine application and verified that I am speaking with the correct person using two identifiers.   Patient Location: Home [address] [place] [address]   Provider location:   Home office  Chief Complaint  Patient presents with  . Back Pain    Possible kidney stone ? onset : 4 days, R kidney area , Hx kidney stones, clear urine, increased fluifs    HPI  Alex Boyle is a 72 y.o. male who presents via audio/video conferencing for a telehealth visit today.  He has complaint of R sided flank/back pain.  Reports history of recurrent kidney stones and this feels similar.  He has increased his fluid intake to try and get this to pass.  He is already on flomax for BPH.  He denies blood in his urine, nausea, or fever.     ROS:  A comprehensive ROS was completed and negative except as noted per HPI  Past Medical History:  Diagnosis Date  . Allergy   . Arthritis   . Asthma   . Diverticulitis   . Hyperlipidemia   . Hypertension   . Myocardial infarction ([place])   . Sleep apnea   . Sleep apnea in adult     Past Surgical History:  Procedure Laterality Date  . COLON SURGERY    . CORONARY STENT PLACEMENT     x2  . JOINT REPLACEMENT     knee  . LAPAROSCOPIC TRANSABDOMINAL HERNIA    .  MANDIBLE SURGERY    . skin cancer     basil cell removed on back    Family History  Adopted: Yes  Problem Relation Age of Onset  . Heart disease Mother   . Hypertension Mother   . Arthritis Mother   . Alzheimer's disease Mother     Social History   Socioeconomic History  . Marital status: Married    Spouse name: Not on file  . Number of children: Not on file  . Years of education: Not on file  . Highest education level: Not on file  Occupational History  . Not on file  Social Needs  . Financial resource strain: Not hard at all  . Food insecurity:    Worry: Never true    Inability: Never true  . Transportation needs:    Medical: No    Non-medical: No  Tobacco Use  . Smoking status: Never Smoker  . Smokeless tobacco: Never Used  Substance and Sexual Activity  . Alcohol use: Yes    Comment: soically   . Drug use: Never  . Sexual activity: Yes    Birth control/protection: None  Lifestyle  . Physical activity:    Days per week: 4 days    Minutes per session: 30 min  . Stress: Not at all  Relationships  . Social connections:  Talks on phone: Not on file    Gets together: Not on file    Attends religious service: Not on file    Active member of club or organization: Not on file    Attends meetings of clubs or organizations: Not on file    Relationship status: Not on file  . Intimate partner violence:    Fear of current or ex partner: Not on file    Emotionally abused: Not on file    Physically abused: Not on file    Forced sexual activity: Not on file  Other Topics Concern  . Not on file  Social History Narrative  . Not on file     Current Outpatient Medications:  .  albuterol (PROVENTIL HFA;VENTOLIN HFA) 108 (90 Base) MCG/ACT inhaler, Inhale 1-2 puffs into the lungs every 6 (six) hours as needed for wheezing or shortness of breath., Disp: 1 Inhaler, Rfl: 12 .  aspirin EC 81 MG tablet, Take 1 tablet (81 mg total) by mouth daily., Disp: 90 tablet, Rfl: 3  .  finasteride (PROSCAR) 5 MG tablet, Take 1 tablet (5 mg total) by mouth daily., Disp: 90 tablet, Rfl: 1 .  fluticasone furoate-vilanterol (BREO ELLIPTA) 100-25 MCG/INH AEPB, Inhale 1 puff into the lungs daily., Disp: 1 each, Rfl: 12 .  lisinopril (PRINIVIL,ZESTRIL) 5 MG tablet, Take 1 tablet (5 mg total) by mouth daily., Disp: 90 tablet, Rfl: 1 .  metoprolol tartrate (LOPRESSOR) 25 MG tablet, Take 1 tablet (25 mg total) by mouth 2 (two) times daily., Disp: 180 tablet, Rfl: 1 .  montelukast (SINGULAIR) 10 MG tablet, Take 1 tablet (10 mg total) by mouth at bedtime., Disp: 90 tablet, Rfl: 1 .  nitroGLYCERIN (NITROSTAT) 0.4 MG SL tablet, Place 1 tablet (0.4 mg total) under the tongue every 5 (five) minutes as needed for chest pain (If no relief after 3 doses call 911)., Disp: 90 tablet, Rfl: 0 .  omeprazole (PRILOSEC OTC) 20 MG tablet, Take 2 tablets (40 mg total) by mouth daily., Disp: 180 tablet, Rfl: 1 .  omeprazole (PRILOSEC) 20 MG capsule, , Disp: , Rfl:  .  rosuvastatin (CRESTOR) 20 MG tablet, Take 1 tablet (20 mg total) by mouth daily., Disp: 90 tablet, Rfl: 1 .  tamsulosin (FLOMAX) 0.4 MG CAPS capsule, Take 1 capsule (0.4 mg total) by mouth daily., Disp: 90 capsule, Rfl: 1  EXAM:  VITALS per patient if applicable: BP (!) 373/42 Comment: BP Cuff @ hm  Pulse 69   Temp 97.9 F (36.6 C) (Oral)   Ht 6\' 1"  (1.854 m)   Wt 222 lb (100.7 kg)   SpO2 91% Comment: Po2 @hm   BMI 29.29 kg/m   GENERAL: alert, oriented, appears well and in no acute distress  HEENT: atraumatic, conjunttiva clear, no obvious abnormalities on inspection of external nose and ears  NECK: normal movements of the head and neck  LUNGS: on inspection no signs of respiratory distress, breathing rate appears normal, no obvious gross SOB, gasping or wheezing  CV: no obvious cyanosis  MS: moves all visible extremities without noticeable abnormality  PSYCH/NEURO: pleasant and cooperative, no obvious depression or anxiety,  speech and thought processing grossly intact  ASSESSMENT AND PLAN:  Discussed the following assessment and plan:  Flank pain -Concern for kidney stone given prior history.  -He will come into office for UA and KUB.  -Continue flomax and increased fluids.        I discussed the assessment and treatment plan with the patient. The patient  was provided an opportunity to ask questions and all were answered. The patient agreed with the plan and demonstrated an understanding of the instructions.   The patient was advised to call back or seek an in-person evaluation if the symptoms worsen or if the condition fails to improve as anticipated.    Luetta Nutting, DO

## 2018-11-14 NOTE — Assessment & Plan Note (Addendum)
-  Concern for kidney stone given prior history.  -He will come into office for UA and KUB.  -Continue flomax and increased fluids.

## 2018-11-17 ENCOUNTER — Telehealth: Payer: Self-pay

## 2018-11-17 NOTE — Telephone Encounter (Signed)
Copied from [place] [id number]. Topic: General - Inquiry >> Nov 14, 2018  4:40 PM Reyne Dumas L wrote: Reason for CRM:  Pt calling to get xray results. Returned call back . Review over X-ray. Pt . Stated that "pain has lessened, will start doing more yoga stretches to lessen it, possibley more muscular than anything else, thanks for the follow up call. "

## 2019-01-05 ENCOUNTER — Telehealth: Payer: Self-pay

## 2019-01-05 NOTE — Telephone Encounter (Signed)
Questions for Screening COVID-19  Symptom onset: None , Prescreen, #2046 arrival   Travel or Contacts: None   During this illness, did/does the patient experience any of the following symptoms? Fever >100.64F []   Yes [x]   No []   Unknown Subjective fever (felt feverish) []   Yes [x]   No []   Unknown Chills []   Yes [x]   No []   Unknown Muscle aches (myalgia) []   Yes [x]   No []   Unknown Runny nose (rhinorrhea) []   Yes [x]   No []   Unknown Sore throat []   Yes [x]   No []   Unknown Cough (new onset or worsening of chronic cough) []   Yes [x]   No []   Unknown Shortness of breath (dyspnea) []   Yes [x]   No []   Unknown Nausea or vomiting []   Yes [x]   No []   Unknown Headache []   Yes [x]   No []   Unknown Abdominal pain  []   Yes [x]   No []   Unknown Diarrhea (?3 loose/looser than normal stools/24hr period) []   Yes [x]   No []   Unknown Other, specify:  Patient risk factors: Smoker? []   Current []   Former [x]   Never If male, currently pregnant? []   Yes [x]   No  Patient Active Problem List   Diagnosis Date Noted  . Flank pain 11/14/2018  . Hyperlipidemia LDL goal <70 10/13/2018  . HTN (hypertension) 09/23/2018  . CAD (coronary artery disease) 09/23/2018  . Asthma 09/23/2018  . OSA (obstructive sleep apnea) 09/23/2018  . BPH (benign prostatic hyperplasia) 09/23/2018    Plan:  []   High risk for COVID-19 with red flags go to ED (with CP, SOB, weak/lightheaded, or fever > 101.5). Call ahead.  []   High risk for COVID-19 but stable. Inform provider and coordinate time for [facility] visit.   [x]   No red flags but URI signs or symptoms okay for [facility] visit.

## 2019-01-06 ENCOUNTER — Encounter: Payer: Self-pay | Admitting: Family Medicine

## 2019-01-06 ENCOUNTER — Ambulatory Visit (INDEPENDENT_AMBULATORY_CARE_PROVIDER_SITE_OTHER): Payer: Federal, State, Local not specified - PPO | Admitting: Family Medicine

## 2019-01-06 VITALS — BP 132/98 | HR 90 | Temp 98.6°F | Resp 20 | Wt 225.0 lb

## 2019-01-06 DIAGNOSIS — R109 Unspecified abdominal pain: Secondary | ICD-10-CM

## 2019-01-06 DIAGNOSIS — R209 Unspecified disturbances of skin sensation: Secondary | ICD-10-CM | POA: Diagnosis not present

## 2019-01-06 DIAGNOSIS — R5383 Other fatigue: Secondary | ICD-10-CM | POA: Diagnosis not present

## 2019-01-06 DIAGNOSIS — Z85828 Personal history of other malignant neoplasm of skin: Secondary | ICD-10-CM

## 2019-01-06 DIAGNOSIS — R202 Paresthesia of skin: Secondary | ICD-10-CM | POA: Diagnosis not present

## 2019-01-06 DIAGNOSIS — R7309 Other abnormal glucose: Secondary | ICD-10-CM

## 2019-01-06 DIAGNOSIS — R200 Anesthesia of skin: Secondary | ICD-10-CM | POA: Diagnosis not present

## 2019-01-06 DIAGNOSIS — N401 Enlarged prostate with lower urinary tract symptoms: Secondary | ICD-10-CM

## 2019-01-06 DIAGNOSIS — B356 Tinea cruris: Secondary | ICD-10-CM

## 2019-01-06 DIAGNOSIS — I872 Venous insufficiency (chronic) (peripheral): Secondary | ICD-10-CM | POA: Diagnosis not present

## 2019-01-06 MED ORDER — KETOCONAZOLE 2 % EX CREA: 1.0000 "application " | TOPICAL_CREAM | Freq: Every day | CUTANEOUS | 0 refills | Status: DC

## 2019-01-06 NOTE — Patient Instructions (Signed)

## 2019-01-07 LAB — PSA: PSA: 2.91 ng/mL (ref 0.10–4.00)

## 2019-01-07 LAB — VITAMIN B12: Vitamin B-12: 353 pg/mL (ref 211–911)

## 2019-01-07 LAB — HEMOGLOBIN A1C: Hgb A1c MFr Bld: 5.8 % (ref 4.6–6.5)

## 2019-01-08 ENCOUNTER — Telehealth: Payer: Self-pay | Admitting: Pulmonary Disease

## 2019-01-08 ENCOUNTER — Ambulatory Visit (INDEPENDENT_AMBULATORY_CARE_PROVIDER_SITE_OTHER): Payer: Federal, State, Local not specified - PPO | Admitting: Pulmonary Disease

## 2019-01-08 ENCOUNTER — Encounter: Payer: Self-pay | Admitting: Family Medicine

## 2019-01-08 ENCOUNTER — Other Ambulatory Visit: Payer: Self-pay

## 2019-01-08 ENCOUNTER — Encounter: Payer: Self-pay | Admitting: Pulmonary Disease

## 2019-01-08 VITALS — BP 118/76 | HR 70 | Temp 98.0°F | Ht 72.0 in | Wt 224.6 lb

## 2019-01-08 DIAGNOSIS — R200 Anesthesia of skin: Secondary | ICD-10-CM | POA: Insufficient documentation

## 2019-01-08 DIAGNOSIS — G4733 Obstructive sleep apnea (adult) (pediatric): Secondary | ICD-10-CM

## 2019-01-08 DIAGNOSIS — B356 Tinea cruris: Secondary | ICD-10-CM | POA: Insufficient documentation

## 2019-01-08 DIAGNOSIS — Z9989 Dependence on other enabling machines and devices: Secondary | ICD-10-CM

## 2019-01-08 DIAGNOSIS — Z85828 Personal history of other malignant neoplasm of skin: Secondary | ICD-10-CM | POA: Insufficient documentation

## 2019-01-08 DIAGNOSIS — R202 Paresthesia of skin: Secondary | ICD-10-CM | POA: Insufficient documentation

## 2019-01-08 DIAGNOSIS — I872 Venous insufficiency (chronic) (peripheral): Secondary | ICD-10-CM

## 2019-01-08 HISTORY — DX: Venous insufficiency (chronic) (peripheral): I87.2

## 2019-01-08 NOTE — Assessment & Plan Note (Signed)
-  Intermittent edema resolving with elevation and no other symptoms of volume overload consistent with venous insufficiency.  Hemosiderin staining supports this as well.  Recommend that he try support stocking and keep legs elevated when at rest.

## 2019-01-08 NOTE — Assessment & Plan Note (Signed)
Referral entered to dermatology.

## 2019-01-08 NOTE — Progress Notes (Signed)
Alex Boyle - 73 y.o. male MRN [id number]  Date of birth: May 05, 1946  Subjective Chief Complaint  Patient presents with  . Leg Swelling    numbness/tingling, heart racing after meals, tired all the time     HPI Alex Boyle is a 73 y.o. male with history of HTN, CAD, BPH and HLD here today with complaint of intermittent leg swelling.   -Leg swelling:  Reports intermittent leg swelling throughout the day.  This is worse if he is on his feet for a long period of time or sitting with his legs hanging.  He denies pain with swelling but has had some numbness/tingling sensation in feet.  He denies weakness or cramping in the legs.   He also mentions sometimes he gets feeling of heart racing after eating.  This does not occur with exertional activities.  He was just seen by cardiology in May.  He denies shortness of breath, difficulty with laying flat, or anginal symptoms.   -Jock itch:  Some issues with jock itch not responding to OTC medications.  Symptoms improve but return after stopping medication.    He is also requesting referrals to dermatology for history of numerous BCC and urology for BPH.    ROS:  A comprehensive ROS was completed and negative except as noted per HPI  Allergies  Allergen Reactions  . Codeine Shortness Of Breath  . Penicillins Swelling    Past Medical History:  Diagnosis Date  . Allergy   . Arthritis   . Asthma   . Diverticulitis   . Hyperlipidemia   . Hypertension   . Myocardial infarction ([place])   . Sleep apnea   . Sleep apnea in adult     Past Surgical History:  Procedure Laterality Date  . COLON SURGERY    . CORONARY STENT PLACEMENT     x2  . JOINT REPLACEMENT     knee  . LAPAROSCOPIC TRANSABDOMINAL HERNIA    . MANDIBLE SURGERY    . skin cancer     basil cell removed on back    Social History   Socioeconomic History  . Marital status: Married    Spouse name: Not on file  . Number of children: Not on file  . Years of education: Not on  file  . Highest education level: Not on file  Occupational History  . Not on file  Social Needs  . Financial resource strain: Not hard at all  . Food insecurity    Worry: Never true    Inability: Never true  . Transportation needs    Medical: No    Non-medical: No  Tobacco Use  . Smoking status: Never Smoker  . Smokeless tobacco: Never Used  Substance and Sexual Activity  . Alcohol use: Yes    Comment: soically   . Drug use: Never  . Sexual activity: Yes    Birth control/protection: None  Lifestyle  . Physical activity    Days per week: 4 days    Minutes per session: 30 min  . Stress: Not at all  Relationships  . Social Herbalist on phone: Not on file    Gets together: Not on file    Attends religious service: Not on file    Active member of club or organization: Not on file    Attends meetings of clubs or organizations: Not on file    Relationship status: Not on file  Other Topics Concern  . Not on file  Social History  Narrative  . Not on file    Family History  Adopted: Yes  Problem Relation Age of Onset  . Heart disease Mother   . Hypertension Mother   . Arthritis Mother   . Alzheimer's disease Mother     Health Maintenance  Topic Date Due  . Hepatitis C Screening  09-26-1945  . TETANUS/TDAP  09/28/1965  . COLONOSCOPY  09/28/1996  . PNA vac Low Risk Adult (1 of 2 - PCV13) 09/29/2011  . INFLUENZA VACCINE  01/10/2019    ----------------------------------------------------------------------------------------------------------------------------------------------------------------------------------------------------------------- Physical Exam BP (!) 132/98   Pulse 90   Temp 98.6 F (37 C) (Oral)   Resp 20   Wt 225 lb (102.1 kg)   SpO2 95%   BMI 29.69 kg/m   Physical Exam Constitutional:      Appearance: Normal appearance.  HENT:     Head: Normocephalic and atraumatic.     Mouth/Throat:     Mouth: Mucous membranes are moist.  Eyes:      General: No scleral icterus. Neck:     Musculoskeletal: Neck supple.  Cardiovascular:     Rate and Rhythm: Normal rate and regular rhythm.     Pulses:          Dorsalis pedis pulses are 2+ on the right side and 2+ on the left side.       Posterior tibial pulses are 2+ on the right side and 2+ on the left side.     Comments: Trace b/l edema with hemosiderin deposition bilaterally.  Pulmonary:     Effort: Pulmonary effort is normal.     Breath sounds: Normal breath sounds.  Musculoskeletal:     Right foot: Normal range of motion. No deformity.     Left foot: Normal range of motion. No deformity.  Feet:     Right foot:     Protective Sensation: 4 sites tested. 4 sites sensed.     Skin integrity: No ulcer, blister, skin breakdown, erythema, warmth, callus or dry skin.     Left foot:     Protective Sensation: 4 sites tested. 4 sites sensed.     Skin integrity: No ulcer, blister, skin breakdown, erythema, warmth, callus or dry skin.  Skin:    General: Skin is warm and dry.  Neurological:     General: No focal deficit present.     Mental Status: He is alert.  Psychiatric:        Mood and Affect: Mood normal.     ------------------------------------------------------------------------------------------------------------------------------------------------------------------------------------------------------------------- Assessment and Plan  Venous insufficiency -Intermittent edema resolving with elevation and no other symptoms of volume overload consistent with venous insufficiency.  Hemosiderin staining supports this as well.  Recommend that he try support stocking and keep legs elevated when at rest.     Numbness and tingling of both feet Check b12 and blood glucose today.   BPH (benign prostatic hyperplasia) -Update PSA and referral placed to urology.   Flank pain Passed some sediment a few weeks ago and pain resolved.   History of basal cell cancer Referral entered  to dermatology.   Tinea cruris -Topical nizoral, if not improving with this may need systemic antifungal.

## 2019-01-08 NOTE — Assessment & Plan Note (Signed)
-  Topical nizoral, if not improving with this may need systemic antifungal.

## 2019-01-08 NOTE — Assessment & Plan Note (Signed)
Check b12 and blood glucose today.

## 2019-01-08 NOTE — Assessment & Plan Note (Signed)
Passed some sediment a few weeks ago and pain resolved.

## 2019-01-08 NOTE — Assessment & Plan Note (Signed)
-  Update PSA and referral placed to urology.

## 2019-01-08 NOTE — Telephone Encounter (Signed)
DME referral  CPAP set up  CPAP of 5-20 with heated humidification  Follow-up in 3 months Obtain download in 6 to 8 weeks-compliance monitoring

## 2019-01-08 NOTE — Progress Notes (Addendum)
Subjective:    Patient ID: Alex Boyle, male    DOB: 1946-04-08, 73 y.o.   MRN: [id number]  Patient with a history of obstructive sleep apnea for many years History of asthma-controlled  Recently relocated from [facility] He is having some more challenges with his asthma with the heat and humidity, recent relocation, more exposure to triggers -No increased use of his rescue inhaler at present, continues on Breo 100  He did have a sleep study performed in January prior to his relocation The study did reveal that he benefited from use of an auto titrating setting He was previously on a CPAP of 14  He feels he continues to benefit from using CPAP with better control of his symptoms Still feels tired  No chest pains or chest discomfort Has no cough  Usually goes to bed between 930 and 11 PM Falls asleep easily Final awakening between 4 and 5 AM  Weight has fluctuated  Past Medical History:  Diagnosis Date  . Allergy   . Arthritis   . Asthma   . Diverticulitis   . Hyperlipidemia   . Hypertension   . Myocardial infarction ([place])   . Sleep apnea   . Sleep apnea in adult    Family History  Adopted: Yes  Problem Relation Age of Onset  . Heart disease Mother   . Hypertension Mother   . Arthritis Mother   . Alzheimer's disease Mother      Review of Systems  Constitutional: Negative for fever and unexpected weight change.  HENT: Negative for congestion, dental problem, ear pain, nosebleeds, postnasal drip, rhinorrhea, sinus pressure, sneezing, sore throat and trouble swallowing.   Eyes: Negative for redness and itching.  Respiratory: Positive for shortness of breath. Negative for cough, chest tightness and wheezing.   Cardiovascular: Positive for leg swelling. Negative for palpitations.  Gastrointestinal: Negative for nausea and vomiting.  Genitourinary: Negative for dysuria.  Musculoskeletal: Negative for joint swelling.  Skin: Negative for rash.   Allergic/Immunologic: Positive for environmental allergies. Negative for food allergies and immunocompromised state.  Neurological: Negative for headaches.  Hematological: Bruises/bleeds easily.  Psychiatric/Behavioral: Negative for dysphoric mood. The patient is not nervous/anxious.        Objective:   Physical Exam Constitutional:      Appearance: Normal appearance.  HENT:     Head: Normocephalic and atraumatic.  Eyes:     Extraocular Movements: Extraocular movements intact.     Pupils: Pupils are equal, round, and reactive to light.  Neck:     Musculoskeletal: Normal range of motion. No neck rigidity or muscular tenderness.  Cardiovascular:     Rate and Rhythm: Normal rate and regular rhythm.     Heart sounds: No murmur.  Pulmonary:     Effort: Pulmonary effort is normal. No respiratory distress.     Breath sounds: Normal breath sounds. No stridor. No wheezing or rhonchi.  Abdominal:     General: There is no distension.     Tenderness: There is no abdominal tenderness.  Musculoskeletal: Normal range of motion.        General: Swelling present. No tenderness.  Skin:    General: Skin is warm and dry.     Coloration: Skin is not jaundiced or pale.  Neurological:     Mental Status: He is oriented to person, place, and time.  Psychiatric:        Mood and Affect: Mood normal.     BP 118/76 (BP Location: Right Arm, Cuff Size:  Normal)   Pulse 70   Temp 98 F (36.7 C) (Oral)   Ht 6' (1.829 m)   Wt 224 lb 9.6 oz (101.9 kg)   SpO2 98%   BMI 30.46 kg/m  Results of the Epworth flowsheet 01/08/2019  Sitting and reading 2  Watching TV 2  Sitting, inactive in a public place (e.g. a theatre or a meeting) 0  As a passenger in a car for an hour without a break 0  Lying down to rest in the afternoon when circumstances permit 0  Sitting and talking to someone 0  Sitting quietly after a lunch without alcohol 0  In a car, while stopped for a few minutes in traffic 0  Total score  4      Assessment & Plan:  .  Obstructive sleep apnea  .  Asthma  Plan: We will obtain data from recent sleep study  .  Plan for set up for auto titrating CPAP  .  Continue Breo 100 at present, we did talk about escalating dose-he feels he is still able to manage symptoms well with current dose  .  Avoid triggers as tolerated  .  Rescue inhaler use as needed   We will see him back in the office in about 3 months  Encouraged to call with significant concerns   Addendum: Recent sleep study reviewed performed December 26, 2017 Revealed moderate obstructive sleep apnea  He had been titrated to BiPAP June 24, 2018-BiPAP pressures of 14/7  I do believe patient can be appropriately treated with CPAP auto titrating CPAP which is what been discussed with him in the past  We will set him up with auto titrating CPAP pressures of 5-20 Close clinical follow-up with compliance monitoring

## 2019-01-08 NOTE — Telephone Encounter (Signed)
DME order placed for cpap.  Nothing further needed at this time.

## 2019-01-08 NOTE — Patient Instructions (Signed)
Obstructive sleep apnea Asthma  Continue current inhaler  We will obtain information from your recent sleep study Set you up with a medical supply company for auto titrating CPAP  We will see you back in the office in about 3 months  Call with significant concerns

## 2019-01-15 DIAGNOSIS — Z85828 Personal history of other malignant neoplasm of skin: Secondary | ICD-10-CM | POA: Diagnosis not present

## 2019-01-15 DIAGNOSIS — D492 Neoplasm of unspecified behavior of bone, soft tissue, and skin: Secondary | ICD-10-CM | POA: Diagnosis not present

## 2019-01-15 DIAGNOSIS — L918 Other hypertrophic disorders of the skin: Secondary | ICD-10-CM | POA: Diagnosis not present

## 2019-01-15 DIAGNOSIS — L820 Inflamed seborrheic keratosis: Secondary | ICD-10-CM | POA: Diagnosis not present

## 2019-01-20 DIAGNOSIS — C4491 Basal cell carcinoma of skin, unspecified: Secondary | ICD-10-CM | POA: Diagnosis not present

## 2019-01-29 DIAGNOSIS — Z87442 Personal history of urinary calculi: Secondary | ICD-10-CM | POA: Diagnosis not present

## 2019-01-29 DIAGNOSIS — R972 Elevated prostate specific antigen [PSA]: Secondary | ICD-10-CM | POA: Diagnosis not present

## 2019-01-29 DIAGNOSIS — R3912 Poor urinary stream: Secondary | ICD-10-CM | POA: Diagnosis not present

## 2019-01-29 DIAGNOSIS — R109 Unspecified abdominal pain: Secondary | ICD-10-CM | POA: Diagnosis not present

## 2019-01-29 DIAGNOSIS — N401 Enlarged prostate with lower urinary tract symptoms: Secondary | ICD-10-CM | POA: Diagnosis not present

## 2019-02-23 ENCOUNTER — Telehealth: Payer: Self-pay | Admitting: Pulmonary Disease

## 2019-02-23 NOTE — Telephone Encounter (Signed)
Attempted to call patient, no answer, left message to call back.  

## 2019-02-24 NOTE — Telephone Encounter (Addendum)
LMTCB x2 for pt 

## 2019-02-25 ENCOUNTER — Ambulatory Visit (INDEPENDENT_AMBULATORY_CARE_PROVIDER_SITE_OTHER): Payer: Medicare Other | Admitting: Behavioral Health

## 2019-02-25 ENCOUNTER — Other Ambulatory Visit: Payer: Self-pay

## 2019-02-25 DIAGNOSIS — Z23 Encounter for immunization: Secondary | ICD-10-CM | POA: Diagnosis not present

## 2019-02-25 NOTE — Progress Notes (Signed)
Patient came in clinic today for Influenza vaccination. IM injection was given in the left deltoid. Patient tolerated the injection well. No signs or symptoms of a reaction were noted prior to patient leaving the nurse visit.

## 2019-02-25 NOTE — Telephone Encounter (Signed)
LMTCB x3 for pt. We have attempted to contact pt several times with no success or call back from pt. Per triage protocol, message will be closed.   

## 2019-03-09 ENCOUNTER — Telehealth (INDEPENDENT_AMBULATORY_CARE_PROVIDER_SITE_OTHER): Payer: Federal, State, Local not specified - PPO | Admitting: Cardiology

## 2019-03-09 ENCOUNTER — Other Ambulatory Visit: Payer: Self-pay

## 2019-03-09 VITALS — BP 116/72 | HR 62 | Ht 72.0 in | Wt 217.8 lb

## 2019-03-09 DIAGNOSIS — I2510 Atherosclerotic heart disease of native coronary artery without angina pectoris: Secondary | ICD-10-CM | POA: Diagnosis not present

## 2019-03-09 DIAGNOSIS — E785 Hyperlipidemia, unspecified: Secondary | ICD-10-CM

## 2019-03-09 DIAGNOSIS — I10 Essential (primary) hypertension: Secondary | ICD-10-CM | POA: Diagnosis not present

## 2019-03-09 DIAGNOSIS — R600 Localized edema: Secondary | ICD-10-CM | POA: Diagnosis not present

## 2019-03-09 MED ORDER — HYDROCHLOROTHIAZIDE 25 MG PO TABS: 25.0000 mg | ORAL_TABLET | Freq: Every day | ORAL | 8 refills | Status: DC | PRN

## 2019-03-09 NOTE — Progress Notes (Signed)
Virtual Visit via Video Note   This visit type was conducted due to national recommendations for restrictions regarding the COVID-19 Pandemic (e.g. social distancing) in an effort to limit this patient's exposure and mitigate transmission in our community.  Due to his co-morbid illnesses, this patient is at least at moderate risk for complications without adequate follow up.  This format is felt to be most appropriate for this patient at this time.  All issues noted in this document were discussed and addressed.  A limited physical exam was performed with this format.  Please refer to the patient's chart for his consent to telehealth for [facility].  Evaluation Performed:  Follow-up visit  This visit type was conducted due to national recommendations for restrictions regarding the COVID-19 Pandemic (e.g. social distancing).  This format is felt to be most appropriate for this patient at this time.  All issues noted in this document were discussed and addressed.  No physical exam was performed (except for noted visual exam findings with Video Visits).  Please refer to the patient's chart (MyChart message for video visits and phone note for telephone visits) for the patient's consent to telehealth for [facility].  Date:  03/09/2019   ID:  Alex Boyle, DOB 08-04-1945, MRN [id number]  Patient Location:  Home  Provider location:   [place]  PCP:  Luetta Nutting, DO  Cardiologist:  Fransico Him, MD Electrophysiologist:  None   Chief Complaint:  CAD  History of Present Illness:    Alex Boyle is a 73 y.o. male who presents via audio/video conferencing for a telehealth visit today.    Alex Boyle is a 73yo male with a history of ASCAD s/p remote MI with PCI x 2, HTN, hyperlipidemia, OSA and asthma.  He recently moved here from [facility].  He is s/p remote MI in 2012 at which time he had total occlusion of the LAD and underwent PCI.  He was also noted to have high-grade lesion in his  left circumflex but was medically managed.  Several months later he had a follow-up nuclear stress test and collapsed on the treadmill.  He underwent PCI of the left circumflex.  His last cath was October 2016 which showed widely patent stents and no other CAD.  He is here today for followup and is doing well. He has chronic DOE secondary to asthma.   He has occasional LE edema.  He denies any chest pain or pressure, PND, orthopnea,  dizziness, palpitations or syncope. He is compliant with his meds and is tolerating meds with no SE.    The patient does not have symptoms concerning for COVID-19 infection (fever, chills, cough, or new shortness of breath).    Prior CV studies:   The following studies were reviewed today:  None  Past Medical History:  Diagnosis Date  . Allergy   . Arthritis   . Asthma   . Diverticulitis   . Hyperlipidemia   . Hypertension   . Myocardial infarction ([place])   . Sleep apnea   . Sleep apnea in adult    Past Surgical History:  Procedure Laterality Date  . COLON SURGERY    . CORONARY STENT PLACEMENT     x2  . JOINT REPLACEMENT     knee  . LAPAROSCOPIC TRANSABDOMINAL HERNIA    . MANDIBLE SURGERY    . skin cancer     basil cell removed on back     Current Meds  Medication Sig  . albuterol (PROVENTIL  HFA;VENTOLIN HFA) 108 (90 Base) MCG/ACT inhaler Inhale 1-2 puffs into the lungs every 6 (six) hours as needed for wheezing or shortness of breath.  Marland Kitchen aspirin EC 81 MG tablet Take 1 tablet (81 mg total) by mouth daily.  . finasteride (PROSCAR) 5 MG tablet Take 1 tablet (5 mg total) by mouth daily.  . fluticasone furoate-vilanterol (BREO ELLIPTA) 100-25 MCG/INH AEPB Inhale 1 puff into the lungs daily.  Marland Kitchen lisinopril (PRINIVIL,ZESTRIL) 5 MG tablet Take 1 tablet (5 mg total) by mouth daily.  . metoprolol tartrate (LOPRESSOR) 25 MG tablet Take 1 tablet (25 mg total) by mouth 2 (two) times daily.  . montelukast (SINGULAIR) 10 MG tablet Take 1 tablet (10 mg total)  by mouth at bedtime.  . nitroGLYCERIN (NITROSTAT) 0.4 MG SL tablet Place 1 tablet (0.4 mg total) under the tongue every 5 (five) minutes as needed for chest pain (If no relief after 3 doses call 911).  Marland Kitchen omeprazole (PRILOSEC OTC) 20 MG tablet Take 2 tablets (40 mg total) by mouth daily.  . rosuvastatin (CRESTOR) 20 MG tablet Take 1 tablet (20 mg total) by mouth daily.  . tamsulosin (FLOMAX) 0.4 MG CAPS capsule Take 1 capsule (0.4 mg total) by mouth daily.     Allergies:   Codeine and Penicillins   Social History   Tobacco Use  . Smoking status: Never Smoker  . Smokeless tobacco: Never Used  Substance Use Topics  . Alcohol use: Yes    Comment: soically   . Drug use: Never     Family Hx: The patient's family history includes Alzheimer's disease in his mother; Arthritis in his mother; Heart disease in his mother; Hypertension in his mother. He was adopted.  ROS:   Please see the history of present illness.     All other systems reviewed and are negative.   Labs/Other Tests and Data Reviewed:    Recent Labs: 10/31/2018: ALT 30; BUN 10; Creat 0.77; Hemoglobin 16.4; Platelets 193; Potassium 4.2; Sodium 139; TSH 2.30   Recent Lipid Panel Lab Results  Component Value Date/Time   CHOL 140 10/31/2018 11:00 AM   TRIG 134 10/31/2018 11:00 AM   HDL 38 (L) 10/31/2018 11:00 AM   CHOLHDL 3.7 10/31/2018 11:00 AM   LDLCALC 79 10/31/2018 11:00 AM    Wt Readings from Last 3 Encounters:  03/09/19 217 lb 12.8 oz (98.8 kg)  01/08/19 224 lb 9.6 oz (101.9 kg)  01/06/19 225 lb (102.1 kg)     Objective:    Vital Signs:  BP 116/72   Pulse 62   Ht 6' (1.829 m)   Wt 217 lb 12.8 oz (98.8 kg)   BMI 29.54 kg/m    CONSTITUTIONAL:  Well nourished, well developed male in no acute distress.  EYES: anicteric MOUTH: oral mucosa is pink RESPIRATORY: Normal respiratory effort, symmetric expansion CARDIOVASCULAR: No peripheral edema SKIN: No rash, lesions or ulcers MUSCULOSKELETAL: no digital  cyanosis NEURO: Cranial Nerves II-XII grossly intact, moves all extremities PSYCH: Intact judgement and insight.  A&O x 3, Mood/affect appropriate   ASSESSMENT & PLAN:    1.  ASCAD -s/p remote MI in 2012 with total occlusion of the LAD s/p PCI.  Also noted to have high-grade lesion in LCx  medically managed.  Several months later he had a follow-up nuclear stress test and collapsed on the treadmill.  S/P  PCI of the left circumflex.  His last cath was October 2016 which showed widely patent stents and no other CAD. -he has  not had any anginal sx -continue on ASA 81mg  daily, BB and statin.   2.  HTN -BP is controlled -continue Lopressor 25mg  BID and Lisinopril 5mg  daily -creatinine was 0.77 10/2018  3.  HLD -LDL goal < 70 -LDL was 79 in May -I will repeat FLP and if LDL still > 70 will need to increase Crestor to 40mg  daily -For now he will continue on Crestor 20mg  daily  4.  LE edema -he is not using added Na in his diet -may have a component of venous insuff -Add HCTZ 25mg  PRN for edema -recommended compression hose  COVID-19 Education: The signs and symptoms of COVID-19 were discussed with the patient and how to seek care for testing (follow up with PCP or arrange E-visit).  The importance of social distancing was discussed today.  Patient Risk:   After full review of this patient's clinical status, I feel that they are at least moderate risk at this time.  Time:   Today, I have spent 20 minutes directly with the patient on telemedicine discussing medical problems including CAD< HTN, HLD.  We also reviewed the symptoms of COVID 19 and the ways to protect against contracting the virus with telehealth technology.  I spent an additional 5 minutes reviewing patient's chart including labs.  Medication Adjustments/Labs and Tests Ordered: Current medicines are reviewed at length with the patient today.  Concerns regarding medicines are outlined above.  Tests Ordered: No orders of  the defined types were placed in this encounter.  Medication Changes: No orders of the defined types were placed in this encounter.   Disposition:  Follow up in 1 year(s)  Signed, Fransico Him, MD  03/09/2019 8:28 AM    [facility]

## 2019-03-09 NOTE — Patient Instructions (Signed)
Medication Instructions:  1) START HCTZ 25 mg once daily as needed for swelling.  Labwork: Your provider recommends that you return for FASTING lab work.  Follow-Up: Your provider wants you to follow-up in: 1 year with Dr. Radford Pax. You will receive a reminder letter in the mail two months in advance. If you don't receive a letter, please call our office to schedule the follow-up appointment.

## 2019-03-11 ENCOUNTER — Telehealth: Payer: Self-pay

## 2019-03-11 DIAGNOSIS — G4733 Obstructive sleep apnea (adult) (pediatric): Secondary | ICD-10-CM

## 2019-03-11 NOTE — Telephone Encounter (Signed)
Continued from above...  Whether tomorrow's appt w/ AO is continued or r/s, can we please contact DME to help get delivery of CPAP.

## 2019-03-11 NOTE — Telephone Encounter (Signed)
Called [facility] and spoke with Angie who reported that they ([facility]) received the CPAP pressure order in August but patient's CPAP was performed in July 2019 which is greater than Medicare's requirement of 12 months, therefore they cannot do any adjustments, etc.  Per Angie they ATC patient and left a message for our office (no documentation to support this).  Called spoke with patient to discuss the above.  Patient stated that he has had a BiPAP titration study in January 2020 and the technician recommended BiPAP.  Patient stated that he moved to another city and never follow up with the ordering pulmonologist.  Advised patient will speak with Dr. Ander Slade to see if appt tomorrow is needed to discuss in detail or if BiPAP can be ordered now with follow up at a later date.  Discussed verbally with Dr Ander Slade: okay to order BiPAP as recommended in the titration with follow up in 8-12 weeks.    Called spoke with patient, informed him of Dr Judson Roch recommendations.  Patient okay with this and visit for tomorrow has been [facility] to 12.15.2020 @ 1130.  Order placed to [place]. Nothing further needed; will sign off.

## 2019-03-12 ENCOUNTER — Ambulatory Visit: Payer: Federal, State, Local not specified - PPO | Admitting: Pulmonary Disease

## 2019-03-13 ENCOUNTER — Other Ambulatory Visit: Payer: Federal, State, Local not specified - PPO

## 2019-03-16 DIAGNOSIS — N401 Enlarged prostate with lower urinary tract symptoms: Secondary | ICD-10-CM | POA: Diagnosis not present

## 2019-03-16 DIAGNOSIS — R3912 Poor urinary stream: Secondary | ICD-10-CM | POA: Diagnosis not present

## 2019-03-24 ENCOUNTER — Telehealth: Payer: Self-pay | Admitting: Pulmonary Disease

## 2019-03-24 NOTE — Telephone Encounter (Signed)
Faxed OV note to [facility] again.  Rec'd fax confirm.

## 2019-03-25 ENCOUNTER — Other Ambulatory Visit: Payer: Self-pay | Admitting: Family Medicine

## 2019-04-22 ENCOUNTER — Encounter: Payer: Self-pay | Admitting: Family Medicine

## 2019-04-22 ENCOUNTER — Ambulatory Visit (INDEPENDENT_AMBULATORY_CARE_PROVIDER_SITE_OTHER): Payer: Federal, State, Local not specified - PPO | Admitting: Family Medicine

## 2019-04-22 DIAGNOSIS — J4521 Mild intermittent asthma with (acute) exacerbation: Secondary | ICD-10-CM

## 2019-04-22 DIAGNOSIS — J45901 Unspecified asthma with (acute) exacerbation: Secondary | ICD-10-CM | POA: Insufficient documentation

## 2019-04-22 DIAGNOSIS — S39012A Strain of muscle, fascia and tendon of lower back, initial encounter: Secondary | ICD-10-CM | POA: Insufficient documentation

## 2019-04-22 DIAGNOSIS — B356 Tinea cruris: Secondary | ICD-10-CM

## 2019-04-22 MED ORDER — CYCLOBENZAPRINE HCL 10 MG PO TABS: 10.0000 mg | ORAL_TABLET | Freq: Three times a day (TID) | ORAL | 0 refills | Status: DC | PRN

## 2019-04-22 MED ORDER — AZITHROMYCIN 250 MG PO TABS: ORAL_TABLET | ORAL | 0 refills | Status: DC

## 2019-04-22 MED ORDER — PREDNISONE 20 MG PO TABS: 20.0000 mg | ORAL_TABLET | Freq: Two times a day (BID) | ORAL | 0 refills | Status: AC

## 2019-04-22 MED ORDER — FLUCONAZOLE 150 MG PO TABS: ORAL_TABLET | ORAL | 0 refills | Status: DC

## 2019-04-22 NOTE — Assessment & Plan Note (Signed)
Recurrent issue, will try PO fluconazole 1 tab weekly x 3 weeks.

## 2019-04-22 NOTE — Assessment & Plan Note (Addendum)
-  Continue warm soaks -Add on flexeril.  He'll probably get some benefit from steroid for asthma exacerbation as well.  -He has home stretches already that have been helpful, recommend continuation.

## 2019-04-22 NOTE — Progress Notes (Signed)
Alex Boyle - 73 y.o. male MRN [id number]  Date of birth: Nov 07, 1945   This visit type was conducted due to national recommendations for restrictions regarding the COVID-19 Pandemic (e.g. social distancing).  This format is felt to be most appropriate for this patient at this time.  All issues noted in this document were discussed and addressed.  No physical exam was performed (except for noted visual exam findings with Video Visits).  I discussed the limitations of evaluation and management by telemedicine and the availability of in person appointments. The patient expressed understanding and agreed to proceed.  I connected with@ on 04/22/19 at  8:00 AM EST by a video enabled telemedicine application and verified that I am speaking with the correct person using two identifiers.  Present for visit: Cecille Aver   Patient Location: Home [address] [place] [address]   Provider location:   [place]  Chief Complaint  Patient presents with  . Alex    weezing congestion nasal drip cough pt also hurt his back yesterday.    HPI  Alex Boyle is a 73 y.o. male who presents via audio/video conferencing for a telehealth visit today.  He has complaint of congestion with wheezing and some shortness of breath.  He has history of asthma and this is similar to flares he has had in the past.  He reports that he recently bought a new condo and had been cleaning and thinks the dust and pet dander from the previous owner triggered his symptoms.  He denies fever, chills, body aches, nausea, vomiting, diarrhea, headache or productive cough.   He also reports low back pain from moving furniture and boxes over the weekend.  He has had trouble sleeping due to his back pain.  He has radiation in the buttock area but denies weakness or bowel/bladder problems.  He has used flexeril when his back has went out on him in the past.   He also reports continued problems  with jock itch.  It had cleared up but returned again over this past week as he has been sweating more preparing to move to new condo.     ROS:  A comprehensive ROS was completed and negative except as noted per HPI  Past Medical History:  Diagnosis Date  . Allergy   . Arthritis   . Asthma   . Diverticulitis   . Hyperlipidemia   . Hypertension   . Myocardial infarction ([place])   . Sleep apnea   . Sleep apnea in adult     Past Surgical History:  Procedure Laterality Date  . COLON SURGERY    . CORONARY STENT PLACEMENT     x2  . JOINT REPLACEMENT     knee  . LAPAROSCOPIC TRANSABDOMINAL HERNIA    . MANDIBLE SURGERY    . skin cancer     basil cell removed on back    Family History  Adopted: Yes  Problem Relation Age of Onset  . Heart disease Mother   . Hypertension Mother   . Arthritis Mother   . Alzheimer's disease Mother     Social History   Socioeconomic History  . Marital status: Married    Spouse name: Not on file  . Number of children: Not on file  . Years of education: Not on file  . Highest education level: Not on file  Occupational History  . Not on file  Social Needs  . Financial resource strain: Not hard  at all  . Food insecurity    Worry: Never true    Inability: Never true  . Transportation needs    Medical: No    Non-medical: No  Tobacco Use  . Smoking status: Never Smoker  . Smokeless tobacco: Never Used  Substance and Sexual Activity  . Alcohol use: Yes    Comment: soically   . Drug use: Never  . Sexual activity: Yes    Birth control/protection: None  Lifestyle  . Physical activity    Days per week: 4 days    Minutes per session: 30 min  . Stress: Not at all  Relationships  . Social Herbalist on phone: Not on file    Gets together: Not on file    Attends religious service: Not on file    Active member of club or organization: Not on file    Attends meetings of clubs or organizations: Not on file    Relationship status:  Not on file  . Intimate partner violence    Fear of current or ex partner: Not on file    Emotionally abused: Not on file    Physically abused: Not on file    Forced sexual activity: Not on file  Other Topics Concern  . Not on file  Social History Narrative  . Not on file     Current Outpatient Medications:  .  albuterol (PROVENTIL HFA;VENTOLIN HFA) 108 (90 Base) MCG/ACT inhaler, Inhale 1-2 puffs into the lungs every 6 (six) hours as needed for wheezing or shortness of breath., Disp: 1 Inhaler, Rfl: 12 .  aspirin EC 81 MG tablet, Take 1 tablet (81 mg total) by mouth daily., Disp: 90 tablet, Rfl: 3 .  finasteride (PROSCAR) 5 MG tablet, TAKE ONE TABLET BY MOUTH ONE TIME DAILY, Disp: 90 tablet, Rfl: 1 .  fluticasone furoate-vilanterol (BREO ELLIPTA) 100-25 MCG/INH AEPB, Inhale 1 puff into the lungs daily., Disp: 1 each, Rfl: 12 .  hydrochlorothiazide (HYDRODIURIL) 25 MG tablet, Take 1 tablet (25 mg total) by mouth daily as needed (swelling)., Disp: 30 tablet, Rfl: 8 .  lisinopril (ZESTRIL) 5 MG tablet, TAKE ONE TABLET BY MOUTH ONE TIME DAILY, Disp: 90 tablet, Rfl: 1 .  metoprolol tartrate (LOPRESSOR) 25 MG tablet, TAKE ONE TABLET BY MOUTH TWICE A DAY, Disp: 180 tablet, Rfl: 1 .  montelukast (SINGULAIR) 10 MG tablet, TAKE ONE TABLET BY MOUTH AT BEDTIME, Disp: 90 tablet, Rfl: 1 .  nitroGLYCERIN (NITROSTAT) 0.4 MG SL tablet, Place 1 tablet (0.4 mg total) under the tongue every 5 (five) minutes as needed for chest pain (If no relief after 3 doses call 911)., Disp: 90 tablet, Rfl: 0 .  omeprazole (PRILOSEC) 20 MG capsule, TAKE TWO CAPSULES BY MOUTH ONE TIME DAILY, Disp: 180 capsule, Rfl: 1 .  rosuvastatin (CRESTOR) 20 MG tablet, TAKE ONE TABLET BY MOUTH ONE TIME DAILY, Disp: 90 tablet, Rfl: 1 .  tamsulosin (FLOMAX) 0.4 MG CAPS capsule, TAKE ONE CAPSULE BY MOUTH ONE TIME DAILY, Disp: 90 capsule, Rfl: 1 .  azithromycin (ZITHROMAX) 250 MG tablet, Take 2 tabs on day one followed by 1 tab daily for days  2-5, Disp: 6 tablet, Rfl: 0 .  cyclobenzaprine (FLEXERIL) 10 MG tablet, Take 1 tablet (10 mg total) by mouth 3 (three) times daily as needed for muscle spasms., Disp: 30 tablet, Rfl: 0 .  fluconazole (DIFLUCAN) 150 MG tablet, Take one tab weekly x3 weeks., Disp: 3 tablet, Rfl: 0 .  ketoconazole (NIZORAL) 2 %  cream, Apply 1 application topically daily., Disp: 15 g, Rfl: 0 .  predniSONE (DELTASONE) 20 MG tablet, Take 1 tablet (20 mg total) by mouth 2 (two) times daily with a meal for 5 days., Disp: 10 tablet, Rfl: 0  EXAM:  VITALS per patient if applicable: Temp 0000000 F (36.6 C) (Oral) Comment: per pt. Comment (Src): per pt  Ht 6' (1.829 m)   BMI 29.54 kg/m   GENERAL: alert, oriented, appears well and in no acute distress  HEENT: atraumatic, conjunttiva clear, no obvious abnormalities on inspection of external nose and ears  NECK: normal movements of the head and neck  LUNGS: on inspection no signs of respiratory distress, breathing rate appears normal, no obvious gross SOB, gasping or wheezing  CV: no obvious cyanosis  MS: moves all visible extremities without noticeable abnormality  PSYCH/NEURO: pleasant and cooperative, no obvious depression or anxiety, speech and thought processing grossly intact  ASSESSMENT AND PLAN:  Discussed the following assessment and plan:  Tinea cruris Recurrent issue, will try PO fluconazole 1 tab weekly x 3 weeks.   Asthma exacerbation -Low suspicion for COVID-19 at this time.  -Will treat for asthma flare with prednisone burst and z-pack.  -Continue albuterol as needed and daily singulair -I asked that he let me know if he was having and new or worsening symptoms. He understands and agrees with plan.   Lumbar strain -Continue warm soaks -Add on flexeril.  He'll probably get some benefit from steroid for asthma exacerbation as well.  -He has home stretches already that have been helpful, recommend continuation.          I discussed the  assessment and treatment plan with the patient. The patient was provided an opportunity to ask questions and all were answered. The patient agreed with the plan and demonstrated an understanding of the instructions.   The patient was advised to call back or seek an in-person evaluation if the symptoms worsen or if the condition fails to improve as anticipated.     Luetta Nutting, DO

## 2019-04-22 NOTE — Assessment & Plan Note (Signed)
-  Low suspicion for COVID-19 at this time.  -Will treat for asthma flare with prednisone burst and z-pack.  -Continue albuterol as needed and daily singulair -I asked that he let me know if he was having and new or worsening symptoms. He understands and agrees with plan.

## 2019-04-30 ENCOUNTER — Telehealth: Payer: Self-pay | Admitting: Pulmonary Disease

## 2019-04-30 NOTE — Telephone Encounter (Signed)
Spoke with pt, he states that [facility]  Is stating he needs an OV to discuss switching from CPAP to BIPAP. I called [facility] and spoke with Ivin Booty and she didn't receive the Bipap order from 03/11/2019 from Dr. Ander Slade. Ivin Booty stated that the [place] he is having on 05/26/2019 with Dr Ander Slade is what they need to bill Medicare for new Bipap machine. I called pt to let him know and he understood and will make sure he comes to his appt with AO on 05/26/2019. Nothing further is needed.   [facility] [phone]

## 2019-05-22 ENCOUNTER — Telehealth: Payer: Self-pay | Admitting: Pulmonary Disease

## 2019-05-22 NOTE — Telephone Encounter (Signed)
"  Spoke with pt, he states that [facility]  Is stating he needs an OV to discuss switching from CPAP to BIPAP. I called [facility] and spoke with Ivin Booty and she didn't receive the Bipap order from 03/11/2019 from Dr. Ander Slade. Ivin Booty stated that the [place] he is having on 05/26/2019 with Dr Ander Slade is what they need to bill Medicare for new Bipap machine. I called pt to let him know and he understood and will make sure he comes to his appt with AO on 05/26/2019. Nothing further is needed.   [facility] [phone]"   Message from earlier this month.   Spoke with patient. He stated that he had cancelled his OV on 12.15 due to him not having his bipap. I explained to him that it appears [facility] didn't receive the order from this summer and are requiring him to have another OV to discuss switching from a cpap to bipap despite having the sleep study that he completed back in January 2020 in [place]. He verbalized understanding and gratitude for our office working with him and [facility]. I was able to get him scheduled again for 12/15 at 1130 as a televisit.   Nothing further needed at time of call.

## 2019-05-26 ENCOUNTER — Other Ambulatory Visit: Payer: Self-pay

## 2019-05-26 ENCOUNTER — Ambulatory Visit (INDEPENDENT_AMBULATORY_CARE_PROVIDER_SITE_OTHER): Payer: Federal, State, Local not specified - PPO | Admitting: Pulmonary Disease

## 2019-05-26 ENCOUNTER — Ambulatory Visit: Payer: Federal, State, Local not specified - PPO | Admitting: Pulmonary Disease

## 2019-05-26 DIAGNOSIS — Z9989 Dependence on other enabling machines and devices: Secondary | ICD-10-CM

## 2019-05-26 DIAGNOSIS — G4733 Obstructive sleep apnea (adult) (pediatric): Secondary | ICD-10-CM

## 2019-05-26 NOTE — Progress Notes (Signed)
Virtual Visit via Telephone Note  I connected with Alex Boyle on 05/26/19 at 11:30 AM EST by telephone and verified that I am speaking with the correct person using two identifiers.  Location: Patient: Alex Boyle Provider: Ander Slade   I discussed the limitations, risks, security and privacy concerns of performing an evaluation and management service by telephone and the availability of in person appointments. I also discussed with the patient that there may be a patient responsible charge related to this service. The patient expressed understanding and agreed to proceed.   History of Present Illness: Patient with obstructive sleep apnea  He did have a sleep study performed 06/19/2018 at [facility] Diagnosed with moderate obstructive sleep apnea He was titrated to BiPAP 14/7-excellent efficiency with control of events at optimal pressure  He does have a lot of symptoms recently as he is not been using BiPAP Reports daytime sleepiness Nonrestorative sleep  Goes to bed between 930 and 10 Wakes up about between 5 and 6 Occasional nighttime awakening to use the bathroom  No family history of obstructive sleep apnea He feels healthy otherwise  History of coronary artery disease, hypertension    Observations/Objective: Sounds well on the phone  Assessment and Plan: Moderate obstructive sleep apnea -Adequately treated with BiPAP of 14/7  We will send prescription in for BiPAP 14/7 Patient's symptoms should improve with adequate treatment of his sleep disordered breathing Risk of cardiovascular morbidity, driving while sleepy should improve with adequate treatment  Follow Up Instructions:    I discussed the assessment and treatment plan with the patient. The patient was provided an opportunity to ask questions and all were answered. The patient agreed with the plan and demonstrated an understanding of the instructions.   The patient was advised to call back or seek an  in-person evaluation if the symptoms worsen or if the condition fails to improve as anticipated.  I provided 20 minutes of non-face-to-face time during this encounter. Records reviewed, polysomnogram reviewed   Laurin Coder, MD

## 2019-05-26 NOTE — Telephone Encounter (Signed)
Nothing noted in encounter. Will sign off.  

## 2019-05-26 NOTE — Patient Instructions (Signed)
Obstructive sleep apnea  We will send in paperwork for BiPAP 14/7  Follow-up in 3 months

## 2019-05-28 DIAGNOSIS — R972 Elevated prostate specific antigen [PSA]: Secondary | ICD-10-CM | POA: Diagnosis not present

## 2019-06-25 ENCOUNTER — Telehealth: Payer: Self-pay | Admitting: Pulmonary Disease

## 2019-06-25 NOTE — Telephone Encounter (Signed)
OK- need to let the pt know before we sent order for split night  ATC the pt, NA- line rings and then turns busy

## 2019-06-25 NOTE — Telephone Encounter (Signed)
Spoke with Alex Boyle at [facility]  She states that the pt's last baseline sleep study was back in 2019 and so this is too old  She is needing the pt to have a split night sleep study done  No need for another face to face visit since virtual visit done recent  Please advise if okay to order, thanks

## 2019-06-25 NOTE — Telephone Encounter (Signed)
Okay to order?

## 2019-06-26 NOTE — Telephone Encounter (Signed)
ATC, NA and line turns busy

## 2019-06-29 NOTE — Telephone Encounter (Signed)
ATC pt, no answer. Left message for pt to call back.  

## 2019-06-30 NOTE — Telephone Encounter (Signed)
lmtcb for pt x 2.  °

## 2019-06-30 NOTE — Telephone Encounter (Signed)
Returning call - CB# [phone]

## 2019-07-01 ENCOUNTER — Telehealth: Payer: Self-pay | Admitting: Pulmonary Disease

## 2019-07-01 DIAGNOSIS — G4733 Obstructive sleep apnea (adult) (pediatric): Secondary | ICD-10-CM

## 2019-07-01 NOTE — Telephone Encounter (Signed)
Called and spoke to pt. Informed pt of the need for a baseline sleep study. Pt is agreeable to a new study. Order placed (ok per AO in 1/14 phone note). Pt verbalized understanding and denied any further questions or concerns at this time.   Will forward to PCCs as FYI. Will sign off.

## 2019-07-01 NOTE — Telephone Encounter (Signed)
Will sign off. See phone note from 1.14.2021 for more information.

## 2019-07-01 NOTE — Telephone Encounter (Addendum)
Spoke to [employer] at [facility] - [phone].  She states pt needs a qualifying baseline study.  Study from 12/2017 can't be used due to over 73 year old.  Study from 06/2018 is titration study - pt will new baseline study.  -Please see open note from 06/25/19.

## 2019-07-11 ENCOUNTER — Other Ambulatory Visit (HOSPITAL_COMMUNITY)
Admission: RE | Admit: 2019-07-11 | Discharge: 2019-07-11 | Disposition: A | Discharge status: Home / Self Care | Payer: Federal, State, Local not specified - PPO | Source: Ambulatory Visit | Attending: Pulmonary Disease | Admitting: Pulmonary Disease

## 2019-07-11 DIAGNOSIS — Z20822 Contact with and (suspected) exposure to covid-19: Secondary | ICD-10-CM | POA: Diagnosis not present

## 2019-07-11 DIAGNOSIS — Z01812 Encounter for preprocedural laboratory examination: Secondary | ICD-10-CM | POA: Diagnosis not present

## 2019-07-11 LAB — SARS CORONAVIRUS 2 (TAT 6-24 HRS): SARS Coronavirus 2: NEGATIVE

## 2019-07-13 ENCOUNTER — Other Ambulatory Visit: Payer: Self-pay

## 2019-07-13 ENCOUNTER — Ambulatory Visit (HOSPITAL_BASED_OUTPATIENT_CLINIC_OR_DEPARTMENT_OTHER): Payer: Federal, State, Local not specified - PPO | Attending: Pulmonary Disease | Admitting: Pulmonary Disease

## 2019-07-13 DIAGNOSIS — G4733 Obstructive sleep apnea (adult) (pediatric): Secondary | ICD-10-CM | POA: Insufficient documentation

## 2019-07-16 DIAGNOSIS — G4733 Obstructive sleep apnea (adult) (pediatric): Secondary | ICD-10-CM | POA: Diagnosis not present

## 2019-07-22 ENCOUNTER — Telehealth: Payer: Self-pay | Admitting: Pulmonary Disease

## 2019-07-22 DIAGNOSIS — G4733 Obstructive sleep apnea (adult) (pediatric): Secondary | ICD-10-CM

## 2019-07-22 NOTE — Telephone Encounter (Signed)
Attempted to call pt but unable to reach. Left message for pt to return call. 

## 2019-07-22 NOTE — Telephone Encounter (Signed)
Pt is returning call regarding sleep results - CB# [phone]

## 2019-07-22 NOTE — Telephone Encounter (Signed)
Sleep study result  Date of study 07/13/2019  Impression  Severe obstructive sleep apnea  Recommendation:  Referral to DME company  Trial of CPAP therapy on 16 cm H2O with a Medium size Fisher&Paykel Full Face Mask Simplus mask and heated humidification.  Follow-up in 6 to 8 weeks with compliance monitoring

## 2019-07-22 NOTE — Telephone Encounter (Signed)
Called and spoke with pt letting him know the results of the sleep study per AO and stated to him recommendations was to have him begin cpap tx. Pt verbalized understanding and order for cpap tx was placed and pt also was scheduled a f/u appt. Nothing further needed.

## 2019-07-22 NOTE — Procedures (Signed)
POLYSOMNOGRAPHY  Last, First: Alex Boyle, Alex Boyle MRN: [id number] Gender: Male Age (years): 74 Weight (lbs): 217 DOB: [birth year] BMI: 29 Primary Care: No PCP Epworth Score: 3 Referring: Laurin Coder MD Technician: Carolin Coy Interpreting: Laurin Coder MD Study Type: Split Night BiPAP Ordered Study Type: Split Night CPAP Study date: 07/13/2019 Location: [place] CLINICAL INFORMATION Alex Boyle is a 74 year old Male and was referred to the sleep center for evaluation of G47.33 OSA: Adult and Pediatric (327.23). Indications include Excessive Daytime Sleepiness, Fatigue, Hypertension, OSA, Snoring.   Most recent polysomnogram dated 12/19/2017 revealed an AHI of 62.6/h. Most recent titration study dated 06/19/2018 was optimal at 14.7cm H2O with an AHI of 0/h. MEDICATIONS Patient self administered medications include: N/A. Medications administered during study include No sleep medicine administered.  SLEEP STUDY TECHNIQUE The patient underwent an attended overnight level one polysomnography titration to assess the effects of BIPAP therapy. The following variables were monitored: EEG (C4-A1, C3-A2, O1-A2, O2-A1), EOG, submental and leg EMG, ECG, oxyhemoglobin saturation by pulse oximetry, thoracic and abdominal respiratory effort belts, nasal/oral airflow by pressure sensor, body position sensor and snoring sensor. BIPAP pressure was titrated to eliminate apneas, hypopneas and oxygen desaturation.Hypopneas were scored per AASM definition IB (4% desaturation)  The NPSG portion of the study ended at 1:18:28 AM. The BiPAP titration was initiated at 1:19:44 AM AM with the BIPAP portion of the study ending at 4:50:27 AM.  TECHNICIAN COMMENTS Comments added by Technician: PATIENT WAS ORDERED AS A SPLIT NIGHT STUDY. Comments added by Scorer: N/A SLEEP ARCHITECTURE The recording time for the entire night was 372.4 minutes.  The diagnostic portion was initiated at 10:38:01 PM and  terminated at 1:18:28 AM. The time in bed was 160.5 minutes. EEG confirmed total sleep time was 134.5 minutes yielding a sleep efficiency of 83.8%%. Sleep onset after lights out was 3.8 minutes with a REM latency of 143.5 minutes. The patient spent 47.2%% of the night in stage N1 sleep, 43.5% % in stage N2 sleep, 0.0%% in stage N3 and 9.3% in REM. The Arousal Index was 52.6/hour.  The titration portion was initiated at 1:19:44 AM and terminated at 4:50:27 AM. The time in bed was 210.7 minutes. EEG confirmed total sleep time was 140.5 minutes yielding a sleep efficiency of 66.7%%. Sleep onset after BiPAP initiation was 3.1 minutes with a REM latency of 103.5 minutes. The patient spent 18.5%% of the night in stage N1 sleep, 68.3%% in stage N2 sleep, 0.0%% in stage N3 and 13.2% in REM. The Arousal Index was 24.3/hour. RESPIRATORY PARAMETERS During the diagnostic portion, there were a total of 144 respiratory disturbances recorded; 3 apneas ( 2 obstructive, 0 mixed, 1 central), 133 hypopneas and 8 RERAs. The apnea/hypopnea index 60.7 was events/hour and the RDI was 64.2 events/hour. The central sleep apnea index was 0.4 events/hour. The REM AHI was 19.2/h and NREM AHI was 64.9/h. The REM RDI was 19.2/h and NREM RDI was 68.9/h. The supine AHI was 77.8/h, and the non supine AHI was 9/h; supine during 75.1%% of sleep. The supine RDI was 82.6/h, and the non supine RDI was 8.96/h. Respiratory disturbances were associated with oxygen desaturation down to a nadir of 84.0 % during sleep. The mean oxygen saturation during the study was 91.9%. The cumulative time under 88% oxygen saturation was 12.6 minutes.  During the titration portion, the apnea/hypopnea index (AHI) was 32.0 events/hour and the RDI was 32.9 events/hour. The central sleep apnea index was events/hour. The most appropriate setting of BiPAP  was IPAP/EPAP 23/19 cm H2O. At this setting, the sleep efficiency was 91% and the patient was supine for 0%. The AHI  was 0.0 events per hour(with 0 central events). Oxygen nadir was 91.0 . LEG MOVEMENT DATA The periodic limb movement index was 0.0/hour with an associated arousal index of /hour. CARDIAC DATA The underlying cardiac rhythm was most consistent with sinus rhythm. Mean heart rate was 68.3 during diagnostic portion and 65.1 during titration portion of study. Additional rhythm abnormalities include None.   IMPRESSIONS - EKG showed no cardiac abnormalities. - The patient snored with moderate snoring volume. - Severe Obstructive Sleep apnea(OSA) Optimal pressure attained. - No Significant Central Sleep Apnea (CSA) - Moderate Oxygen Desaturation - No significant periodic leg movements(PLMs) during sleep. However, no significant associated arousals. - Reduced sleep efficiency, short primary sleep latency, long REM sleep latency and no slow wave latency.   DIAGNOSIS - Obstructive Sleep Apnea (327.23 [G47.33 ICD-10])   RECOMMENDATIONS - Trial of CPAP therapy on 16 cm H2O with a Medium size Fisher&Paykel Full Face Mask Simplus mask and heated humidification. - Avoid alcohol, sedatives and other CNS depressants that may worsen sleep apnea and disrupt normal sleep architecture. - Sleep hygiene should be reviewed to assess factors that may improve sleep quality. - Weight management and regular exercise should be initiated or continued. - Return to Sleep Center for re-evaluation.  [Electronically signed] 07/22/2019 10:07 AM  Sherrilyn Rist MD NPI: [id number]

## 2019-07-28 ENCOUNTER — Telehealth: Payer: Self-pay | Admitting: Pulmonary Disease

## 2019-07-28 DIAGNOSIS — G4733 Obstructive sleep apnea (adult) (pediatric): Secondary | ICD-10-CM

## 2019-07-28 NOTE — Telephone Encounter (Signed)
Dr. Ander Slade please advise if the pt needs a CPAP or BiPAP.  At the pt's last OV, Dr. Ander Slade wanted to do a trial of BiPAP. This order was not put in. Based on the HST, Dr. Ander Slade recommended CPAP. Order was placed for CPAP. DME is stating that he qualifies for BiPAP.

## 2019-08-05 NOTE — Telephone Encounter (Signed)
LMTCB for Alex Boyle to let her know of Dr Judson Roch response

## 2019-08-05 NOTE — Telephone Encounter (Signed)
Prescription for BiPAP 23/19  I had reviewed the study and CPAP also did work well for him was why the prescription for CPAP was suggested  If he tolerates BiPAP better than BiPAP 23/19 prescription should be adequate

## 2019-08-06 NOTE — Telephone Encounter (Signed)
Called and spoke to [place] with [facility] and advised her of the recs per AO. Called and spoke to pt. Pt states the Bipap will work better for him. Will placed order after receiving response from Dr. Ander Slade.   Dr. Ander Slade, just to clarify the IPAP is 23, the EPAP is 19, and please advise on pressure support. Thanks.

## 2019-08-09 ENCOUNTER — Ambulatory Visit: Payer: Federal, State, Local not specified - PPO | Attending: Internal Medicine

## 2019-08-09 DIAGNOSIS — Z23 Encounter for immunization: Secondary | ICD-10-CM

## 2019-08-09 NOTE — Progress Notes (Signed)
   Covid-19 Vaccination Clinic  Name:  Alex Boyle    MRN: [id number] DOB: [birth year]  08/09/2019  [name] was observed post Covid-19 immunization for 30 minutes based on pre-vaccination screening without incidence. He was provided with Vaccine Information Sheet and instruction to access the V-Safe system.   [name] was instructed to call 911 with any severe reactions post vaccine: Marland Kitchen Difficulty breathing  . Swelling of your face and throat  . A fast heartbeat  . A bad rash all over your body  . Dizziness and weakness    Immunizations Administered    Name Date Dose VIS Date Route   Pfizer COVID-19 Vaccine 08/09/2019  8:47 AM 0.3 mL 05/22/2019 Intramuscular   Manufacturer: [place]   Lot: [id number]   [place]: [id number]

## 2019-08-10 NOTE — Telephone Encounter (Signed)
Dr. Olalere - please advise. Thanks. 

## 2019-08-18 NOTE — Telephone Encounter (Signed)
Called [facility].  Dr.Olalere's recommendations given.  New Bipap order placed.  Message was taken for [facility], [facility], about new bipap order being placed and to call with any further questions.

## 2019-08-18 NOTE — Telephone Encounter (Signed)
No pressure support detail needed on the prescription  Fixed BiPAP pressure settings of 23/19 Can be adjusted over time if pressures are not tolerated well.  Above pressure is what he was titrated to in the sleep lab

## 2019-08-27 ENCOUNTER — Telehealth: Payer: Self-pay | Admitting: Pulmonary Disease

## 2019-08-27 NOTE — Telephone Encounter (Signed)
Sleep study from 07/13/19 faxed to [facility], [phone].  Nothing further at this time.

## 2019-09-01 DIAGNOSIS — G4733 Obstructive sleep apnea (adult) (pediatric): Secondary | ICD-10-CM | POA: Diagnosis not present

## 2019-09-08 ENCOUNTER — Ambulatory Visit: Payer: Federal, State, Local not specified - PPO | Attending: Internal Medicine

## 2019-09-08 DIAGNOSIS — Z23 Encounter for immunization: Secondary | ICD-10-CM

## 2019-09-08 NOTE — Progress Notes (Signed)
   Covid-19 Vaccination Clinic  Name:  Alex Boyle    MRN: [id number] DOB: 1946-01-23  09/08/2019  [name] was observed post Covid-19 immunization for 30 minutes based on pre-vaccination screening without incident. He was provided with Vaccine Information Sheet and instruction to access the V-Safe system.   [name] was instructed to call 911 with any severe reactions post vaccine: Marland Kitchen Difficulty breathing  . Swelling of face and throat  . A fast heartbeat  . A bad rash all over body  . Dizziness and weakness   Immunizations Administered    Name Date Dose VIS Date Route   Pfizer COVID-19 Vaccine 09/08/2019  8:29 AM 0.3 mL 05/22/2019 Intramuscular   Manufacturer: [place]   Lot: [id number]   [place]: [id number]

## 2019-09-16 ENCOUNTER — Ambulatory Visit: Payer: Federal, State, Local not specified - PPO | Admitting: Primary Care

## 2019-09-17 ENCOUNTER — Ambulatory Visit (INDEPENDENT_AMBULATORY_CARE_PROVIDER_SITE_OTHER): Payer: Federal, State, Local not specified - PPO | Admitting: Primary Care

## 2019-09-17 ENCOUNTER — Encounter: Payer: Self-pay | Admitting: Primary Care

## 2019-09-17 ENCOUNTER — Other Ambulatory Visit: Payer: Self-pay

## 2019-09-17 VITALS — BP 124/70 | HR 64 | Temp 97.3°F | Ht 72.0 in | Wt 227.2 lb

## 2019-09-17 DIAGNOSIS — G4733 Obstructive sleep apnea (adult) (pediatric): Secondary | ICD-10-CM

## 2019-09-17 NOTE — Progress Notes (Signed)
@Patient  ID: Alex Boyle, male    DOB: 1945/07/18, 74 y.o.   MRN: [id number]  Chief Complaint  Patient presents with  . Follow-up    F/u Bipap-c/o throat, nose and ears dried out. Nose bleeds x 3 nights not.    Referring provider: Luetta Nutting, DO  HPI: 74 year old male, never smoked. PMH significant for asthma, OSA. Split night sleep study in July 2019 showed severe obstructive sleep apnea with AHI 62/hr. Patient of Dr. Ander Slade, last seen on 05/26/19. Fixed Bipap pressure setting 23/19.   09/17/2019 Patient reports some difficulty with BIPAP mask and associated nasal congestion, nose bleeds and dry cough. States that he will fill CPAP reservoir to the top with cool purified water and by the morning the water is only about half way down. He is taking montelukast 10mg  at bedtime. He does not currently use nasal sprays regularly. He will at times use saline nasal spray in the afternoon. He does report more restful sleep when using BIPAP. Epworth score 8/24. DME company is [facility].   Allergies  Allergen Reactions  . Codeine Shortness Of Breath  . Penicillins Swelling    Immunization History  Administered Date(s) Administered  . Fluad Quad(high Dose 65+) 02/25/2019  . Influenza, High Dose Seasonal PF 07/02/2018  . PFIZER SARS-COV-2 Vaccination 08/09/2019, 09/08/2019    Past Medical History:  Diagnosis Date  . Allergy   . Arthritis   . Asthma   . Diverticulitis   . Hyperlipidemia   . Hypertension   . Myocardial infarction ([place])   . Sleep apnea   . Sleep apnea in adult     Tobacco History: Social History   Tobacco Use  Smoking Status Never Smoker  Smokeless Tobacco Never Used   Counseling given: Not Answered   Outpatient Medications Prior to Visit  Medication Sig Dispense Refill  . albuterol (PROVENTIL HFA;VENTOLIN HFA) 108 (90 Base) MCG/ACT inhaler Inhale 1-2 puffs into the lungs every 6 (six) hours as needed for wheezing or shortness of breath. 1  Inhaler 12  . aspirin EC 81 MG tablet Take 1 tablet (81 mg total) by mouth daily. 90 tablet 3  . cyclobenzaprine (FLEXERIL) 10 MG tablet Take 1 tablet (10 mg total) by mouth 3 (three) times daily as needed for muscle spasms. 30 tablet 0  . finasteride (PROSCAR) 5 MG tablet TAKE ONE TABLET BY MOUTH ONE TIME DAILY 90 tablet 1  . fluticasone furoate-vilanterol (BREO ELLIPTA) 100-25 MCG/INH AEPB Inhale 1 puff into the lungs daily. 1 each 12  . lisinopril (ZESTRIL) 5 MG tablet TAKE ONE TABLET BY MOUTH ONE TIME DAILY 90 tablet 1  . metoprolol tartrate (LOPRESSOR) 25 MG tablet TAKE ONE TABLET BY MOUTH TWICE A DAY 180 tablet 1  . montelukast (SINGULAIR) 10 MG tablet TAKE ONE TABLET BY MOUTH AT BEDTIME 90 tablet 1  . nitroGLYCERIN (NITROSTAT) 0.4 MG SL tablet Place 1 tablet (0.4 mg total) under the tongue every 5 (five) minutes as needed for chest pain (If no relief after 3 doses call 911). 90 tablet 0  . omeprazole (PRILOSEC) 20 MG capsule TAKE TWO CAPSULES BY MOUTH ONE TIME DAILY 180 capsule 1  . rosuvastatin (CRESTOR) 20 MG tablet TAKE ONE TABLET BY MOUTH ONE TIME DAILY 90 tablet 1  . tamsulosin (FLOMAX) 0.4 MG CAPS capsule TAKE ONE CAPSULE BY MOUTH ONE TIME DAILY 90 capsule 1  . hydrochlorothiazide (HYDRODIURIL) 25 MG tablet Take 1 tablet (25 mg total) by mouth daily as needed (swelling). (  Patient not taking: Reported on 05/26/2019) 30 tablet 8  . ketoconazole (NIZORAL) 2 % cream Apply 1 application topically daily. 15 g 0   No facility-administered medications prior to visit.    Review of Systems  Review of Systems  HENT: Positive for congestion and nosebleeds.        Dry mouth  Respiratory: Negative for cough, shortness of breath and wheezing.     Physical Exam  BP 124/70 (BP Location: Left Arm, Cuff Size: Normal)   Pulse 64   Temp (!) 97.3 F (36.3 C) (Temporal)   Ht 6' (1.829 m)   Wt 227 lb 3.2 oz (103.1 kg)   SpO2 95%   BMI 30.81 kg/m  Physical Exam Constitutional:       Appearance: Normal appearance.  HENT:     Head: Normocephalic and atraumatic.     Mouth/Throat:     Mouth: Mucous membranes are moist.     Pharynx: Oropharynx is clear.  Cardiovascular:     Rate and Rhythm: Normal rate and regular rhythm.  Pulmonary:     Effort: Pulmonary effort is normal.     Breath sounds: Normal breath sounds.  Musculoskeletal:     Cervical back: Normal range of motion and neck supple.  Skin:    General: Skin is warm and dry.  Neurological:     General: No focal deficit present.     Mental Status: He is alert and oriented to person, place, and time. Mental status is at baseline.  Psychiatric:        Mood and Affect: Mood normal.        Behavior: Behavior normal.        Thought Content: Thought content normal.        Judgment: Judgment normal.      Lab Results:  CBC    Component Value Date/Time   WBC 6.9 10/31/2018 1100   RBC 5.44 10/31/2018 1100   HGB 16.4 10/31/2018 1100   HCT 48.0 10/31/2018 1100   PLT 193 10/31/2018 1100   MCV 88.2 10/31/2018 1100   MCH 30.1 10/31/2018 1100   MCHC 34.2 10/31/2018 1100   RDW 13.2 10/31/2018 1100   LYMPHSABS 1,932 10/31/2018 1100   EOSABS 207 10/31/2018 1100   BASOSABS 41 10/31/2018 1100    BMET    Component Value Date/Time   NA 139 10/31/2018 1100   K 4.2 10/31/2018 1100   CL 99 10/31/2018 1100   CO2 29 10/31/2018 1100   GLUCOSE 98 10/31/2018 1100   BUN 10 10/31/2018 1100   CREATININE 0.77 10/31/2018 1100   CALCIUM 9.6 10/31/2018 1100    BNP No results found for: BNP  ProBNP No results found for: PROBNP  Imaging: No results found.   Assessment & Plan:   OSA (obstructive sleep apnea) - Compliant with BIPAP and reports benefit from use - Used 14/16; average 3 hours 8 minutes  - Having some issues with pressure setting and dry mouth/nose bleeds - IPAP 23 / EPAP 19 (8.7- 85%); AHI 1.3  - Decrease pressure setting to 22/18  - Recommend patient use saline nasal rinse/AYR nasal gel twice daily  and biotene mouth wash twice daily - FU in 3 months    Martyn Ehrich, NP 09/17/2019

## 2019-09-17 NOTE — Assessment & Plan Note (Addendum)
-   Compliant with BIPAP and reports benefit from use - Used 14/16; average 3 hours 8 minutes  - Having some issues with pressure setting and dry mouth/nose bleeds - IPAP 23 / EPAP 19 (8.7- 85%); AHI 1.3  - Decrease pressure setting to 22/18  - Recommend patient use saline nasal rinse/AYR nasal gel twice daily and biotene mouth wash twice daily - FU in 3 months

## 2019-09-17 NOTE — Patient Instructions (Addendum)
Recommendations: - Use saline/ocean nasal solution twice daily ( and nasal gel called Ayr) - Try biotene mouth wash twice daily for dry mouth  - Recommend room temperature water for humidification reservoir  Orders: - Change BIPAP Max EPAP 22/ Max IPAP 18  Follow-up: - 3 months with Dr. Hermina Staggers or sooner if needed     CPAP and BPAP Information CPAP and BPAP are methods of helping a person breathe with the use of air pressure. CPAP stands for "continuous positive airway pressure." BPAP stands for "bi-level positive airway pressure." In both methods, air is blown through your nose or mouth and into your air passages to help you breathe well. CPAP and BPAP use different amounts of pressure to blow air. With CPAP, the amount of pressure stays the same while you breathe in and out. With BPAP, the amount of pressure is increased when you breathe in (inhale) so that you can take larger breaths. Your health care provider will recommend whether CPAP or BPAP would be more helpful for you. Why are CPAP and BPAP treatments used? CPAP or BPAP can be helpful if you have:  Sleep apnea.  Chronic obstructive pulmonary disease (COPD).  Heart failure.  Medical conditions that weaken the muscles of the chest including muscular dystrophy, or neurological diseases such as amyotrophic lateral sclerosis (ALS).  Other problems that cause breathing to be weak, abnormal, or difficult. CPAP is most commonly used for obstructive sleep apnea (OSA) to keep the airways from collapsing when the muscles relax during sleep. How is CPAP or BPAP administered? Both CPAP and BPAP are provided by a small machine with a flexible plastic tube that attaches to a plastic mask. You wear the mask. Air is blown through the mask into your nose or mouth. The amount of pressure that is used to blow the air can be adjusted on the machine. Your health care provider will determine the pressure setting that should be used based on your  individual needs. When should CPAP or BPAP be used? In most cases, the mask only needs to be worn during sleep. Generally, the mask needs to be worn throughout the night and during any daytime naps. People with certain medical conditions may also need to wear the mask at other times when they are awake. Follow instructions from your health care provider about when to use the machine. What are some tips for using the mask?   Because the mask needs to be snug, some people feel trapped or closed-in (claustrophobic) when first using the mask. If you feel this way, you may need to get used to the mask. One way to do this is by holding the mask loosely over your nose or mouth and then gradually applying the mask more snugly. You can also gradually increase the amount of time that you use the mask.  Masks are available in various types and sizes. Some fit over your mouth and nose while others fit over just your nose. If your mask does not fit well, talk with your health care provider about getting a different one.  If you are using a mask that fits over your nose and you tend to breathe through your mouth, a chin strap may be applied to help keep your mouth closed.  The CPAP and BPAP machines have alarms that may sound if the mask comes off or develops a leak.  If you have trouble with the mask, it is very important that you talk with your health care provider about  finding a way to make the mask easier to tolerate. Do not stop using the mask. Stopping the use of the mask could have a negative impact on your health. What are some tips for using the machine?  Place your CPAP or BPAP machine on a secure table or stand near an electrical outlet.  Know where the on/off switch is located on the machine.  Follow instructions from your health care provider about how to set the pressure on your machine and when you should use it.  Do not eat or drink while the CPAP or BPAP machine is on. Food or fluids could  get pushed into your lungs by the pressure of the CPAP or BPAP.  Do not smoke. Tobacco smoke residue can damage the machine.  For home use, CPAP and BPAP machines can be rented or purchased through home health care companies. Many different brands of machines are available. Renting a machine before purchasing may help you find out which particular machine works well for you.  Keep the CPAP or BPAP machine and attachments clean. Ask your health care provider for specific instructions. Get help right away if:  You have redness or open areas around your nose or mouth where the mask fits.  You have trouble using the CPAP or BPAP machine.  You cannot tolerate wearing the CPAP or BPAP mask.  You have pain, discomfort, and bloating in your abdomen. Summary  CPAP and BPAP are methods of helping a person breathe with the use of air pressure.  Both CPAP and BPAP are provided by a small machine with a flexible plastic tube that attaches to a plastic mask.  If you have trouble with the mask, it is very important that you talk with your health care provider about finding a way to make the mask easier to tolerate. This information is not intended to replace advice given to you by your health care provider. Make sure you discuss any questions you have with your health care provider. Document Revised: 09/17/2018 Document Reviewed: 04/16/2016 Elsevier Patient Education  [place].

## 2019-09-23 ENCOUNTER — Other Ambulatory Visit: Payer: Self-pay | Admitting: Family Medicine

## 2019-10-02 DIAGNOSIS — G4733 Obstructive sleep apnea (adult) (pediatric): Secondary | ICD-10-CM | POA: Diagnosis not present

## 2019-11-01 DIAGNOSIS — G4733 Obstructive sleep apnea (adult) (pediatric): Secondary | ICD-10-CM | POA: Diagnosis not present

## 2019-12-02 DIAGNOSIS — R3912 Poor urinary stream: Secondary | ICD-10-CM | POA: Diagnosis not present

## 2019-12-02 DIAGNOSIS — R3915 Urgency of urination: Secondary | ICD-10-CM | POA: Diagnosis not present

## 2019-12-02 DIAGNOSIS — G4733 Obstructive sleep apnea (adult) (pediatric): Secondary | ICD-10-CM | POA: Diagnosis not present

## 2019-12-02 DIAGNOSIS — N401 Enlarged prostate with lower urinary tract symptoms: Secondary | ICD-10-CM | POA: Diagnosis not present

## 2019-12-02 DIAGNOSIS — R972 Elevated prostate specific antigen [PSA]: Secondary | ICD-10-CM | POA: Diagnosis not present

## 2019-12-09 ENCOUNTER — Other Ambulatory Visit: Payer: Self-pay | Admitting: Family Medicine

## 2019-12-24 ENCOUNTER — Other Ambulatory Visit: Payer: Self-pay | Admitting: Family Medicine

## 2020-01-01 DIAGNOSIS — G4733 Obstructive sleep apnea (adult) (pediatric): Secondary | ICD-10-CM | POA: Diagnosis not present

## 2020-02-01 DIAGNOSIS — G4733 Obstructive sleep apnea (adult) (pediatric): Secondary | ICD-10-CM | POA: Diagnosis not present

## 2020-03-03 DIAGNOSIS — G4733 Obstructive sleep apnea (adult) (pediatric): Secondary | ICD-10-CM | POA: Diagnosis not present

## 2020-03-11 ENCOUNTER — Other Ambulatory Visit: Payer: Self-pay | Admitting: Family Medicine

## 2020-03-11 ENCOUNTER — Other Ambulatory Visit: Payer: Self-pay | Admitting: *Deleted

## 2020-03-11 MED ORDER — HYDROCHLOROTHIAZIDE 25 MG PO TABS: 25.0000 mg | ORAL_TABLET | Freq: Every day | ORAL | 0 refills | Status: DC | PRN

## 2020-03-12 ENCOUNTER — Other Ambulatory Visit: Payer: Self-pay | Admitting: Family Medicine

## 2020-03-14 ENCOUNTER — Ambulatory Visit (INDEPENDENT_AMBULATORY_CARE_PROVIDER_SITE_OTHER): Payer: Federal, State, Local not specified - PPO

## 2020-03-14 ENCOUNTER — Encounter: Payer: Self-pay | Admitting: Family Medicine

## 2020-03-14 ENCOUNTER — Other Ambulatory Visit: Payer: Self-pay

## 2020-03-14 DIAGNOSIS — Z23 Encounter for immunization: Secondary | ICD-10-CM

## 2020-03-14 NOTE — Progress Notes (Signed)
Pt came in an received his high dose flu shot in his left deltoid. Pt tolerated injection well and was given a vaccine information sheet and instructed to stay in office for 15-20 min.

## 2020-03-22 ENCOUNTER — Other Ambulatory Visit: Payer: Self-pay | Admitting: Family Medicine

## 2020-04-02 DIAGNOSIS — G4733 Obstructive sleep apnea (adult) (pediatric): Secondary | ICD-10-CM | POA: Diagnosis not present

## 2020-04-07 DIAGNOSIS — Z23 Encounter for immunization: Secondary | ICD-10-CM | POA: Diagnosis not present

## 2020-04-25 ENCOUNTER — Encounter: Payer: Federal, State, Local not specified - PPO | Admitting: Nurse Practitioner

## 2020-04-26 ENCOUNTER — Encounter: Payer: Federal, State, Local not specified - PPO | Admitting: Family Medicine

## 2020-04-27 ENCOUNTER — Other Ambulatory Visit: Payer: Self-pay

## 2020-04-28 ENCOUNTER — Ambulatory Visit (INDEPENDENT_AMBULATORY_CARE_PROVIDER_SITE_OTHER)
Admission: RE | Admit: 2020-04-28 | Discharge: 2020-04-28 | Disposition: A | Discharge status: Home / Self Care | Payer: Federal, State, Local not specified - PPO | Source: Ambulatory Visit | Attending: Nurse Practitioner | Admitting: Nurse Practitioner

## 2020-04-28 ENCOUNTER — Encounter: Payer: Self-pay | Admitting: Nurse Practitioner

## 2020-04-28 ENCOUNTER — Ambulatory Visit (INDEPENDENT_AMBULATORY_CARE_PROVIDER_SITE_OTHER): Payer: Federal, State, Local not specified - PPO | Admitting: Nurse Practitioner

## 2020-04-28 VITALS — BP 124/78 | Temp 97.6°F | Ht 72.0 in | Wt 226.4 lb

## 2020-04-28 DIAGNOSIS — I10 Essential (primary) hypertension: Secondary | ICD-10-CM | POA: Diagnosis not present

## 2020-04-28 DIAGNOSIS — M47814 Spondylosis without myelopathy or radiculopathy, thoracic region: Secondary | ICD-10-CM | POA: Diagnosis not present

## 2020-04-28 DIAGNOSIS — M2041 Other hammer toe(s) (acquired), right foot: Secondary | ICD-10-CM

## 2020-04-28 DIAGNOSIS — Z85828 Personal history of other malignant neoplasm of skin: Secondary | ICD-10-CM

## 2020-04-28 DIAGNOSIS — E785 Hyperlipidemia, unspecified: Secondary | ICD-10-CM | POA: Diagnosis not present

## 2020-04-28 DIAGNOSIS — M546 Pain in thoracic spine: Secondary | ICD-10-CM

## 2020-04-28 LAB — HEPATIC FUNCTION PANEL
ALT: 31 U/L (ref 0–53)
AST: 26 U/L (ref 0–37)
Albumin: 4.5 g/dL (ref 3.5–5.2)
Alkaline Phosphatase: 49 U/L (ref 39–117)
Bilirubin, Direct: 0.1 mg/dL (ref 0.0–0.3)
Total Bilirubin: 0.5 mg/dL (ref 0.2–1.2)
Total Protein: 7.6 g/dL (ref 6.0–8.3)

## 2020-04-28 LAB — LIPID PANEL
Cholesterol: 162 mg/dL (ref 0–200)
HDL: 38.6 mg/dL — ABNORMAL LOW (ref >39.00)
NonHDL: 123.11
Total CHOL/HDL Ratio: 4
Triglycerides: 268 mg/dL — ABNORMAL HIGH (ref 0.0–149.0)
VLDL: 53.6 mg/dL — ABNORMAL HIGH (ref 0.0–40.0)

## 2020-04-28 LAB — BASIC METABOLIC PANEL
BUN: 11 mg/dL (ref 6–23)
CO2: 33 mEq/L — ABNORMAL HIGH (ref 19–32)
Calcium: 10.1 mg/dL (ref 8.4–10.5)
Chloride: 100 mEq/L (ref 96–112)
Creatinine, Ser: 0.89 mg/dL (ref 0.40–1.50)
GFR: 84.97 mL/min (ref >60.00)
Glucose, Bld: 86 mg/dL (ref 70–99)
Potassium: 4.4 mEq/L (ref 3.5–5.1)
Sodium: 139 mEq/L (ref 135–145)

## 2020-04-28 LAB — LDL CHOLESTEROL, DIRECT: Direct LDL: 94 mg/dL

## 2020-04-28 IMAGING — DX DG THORACIC SPINE 3V
3 series · 3 of 3 positions shown · non-contrast
Comparison: None.

CLINICAL DATA: Right-sided mid back pain 2 months.  No injury.

EXAM:
THORACIC SPINE - 3 VIEWS

[t-spine ap]
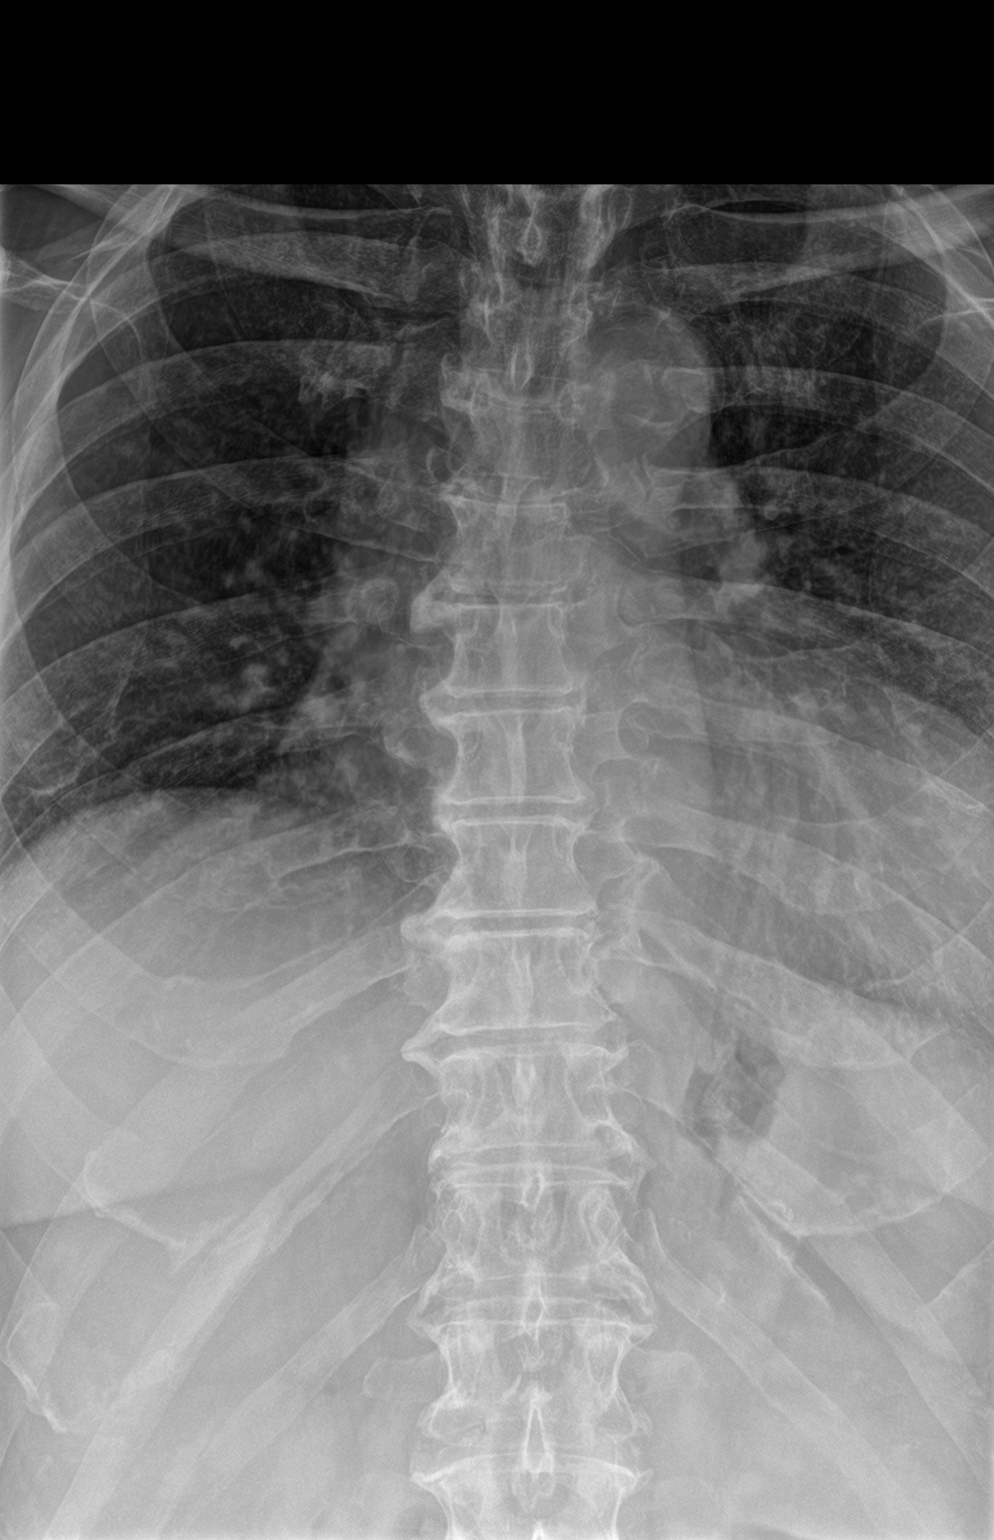

[t-spine lat]
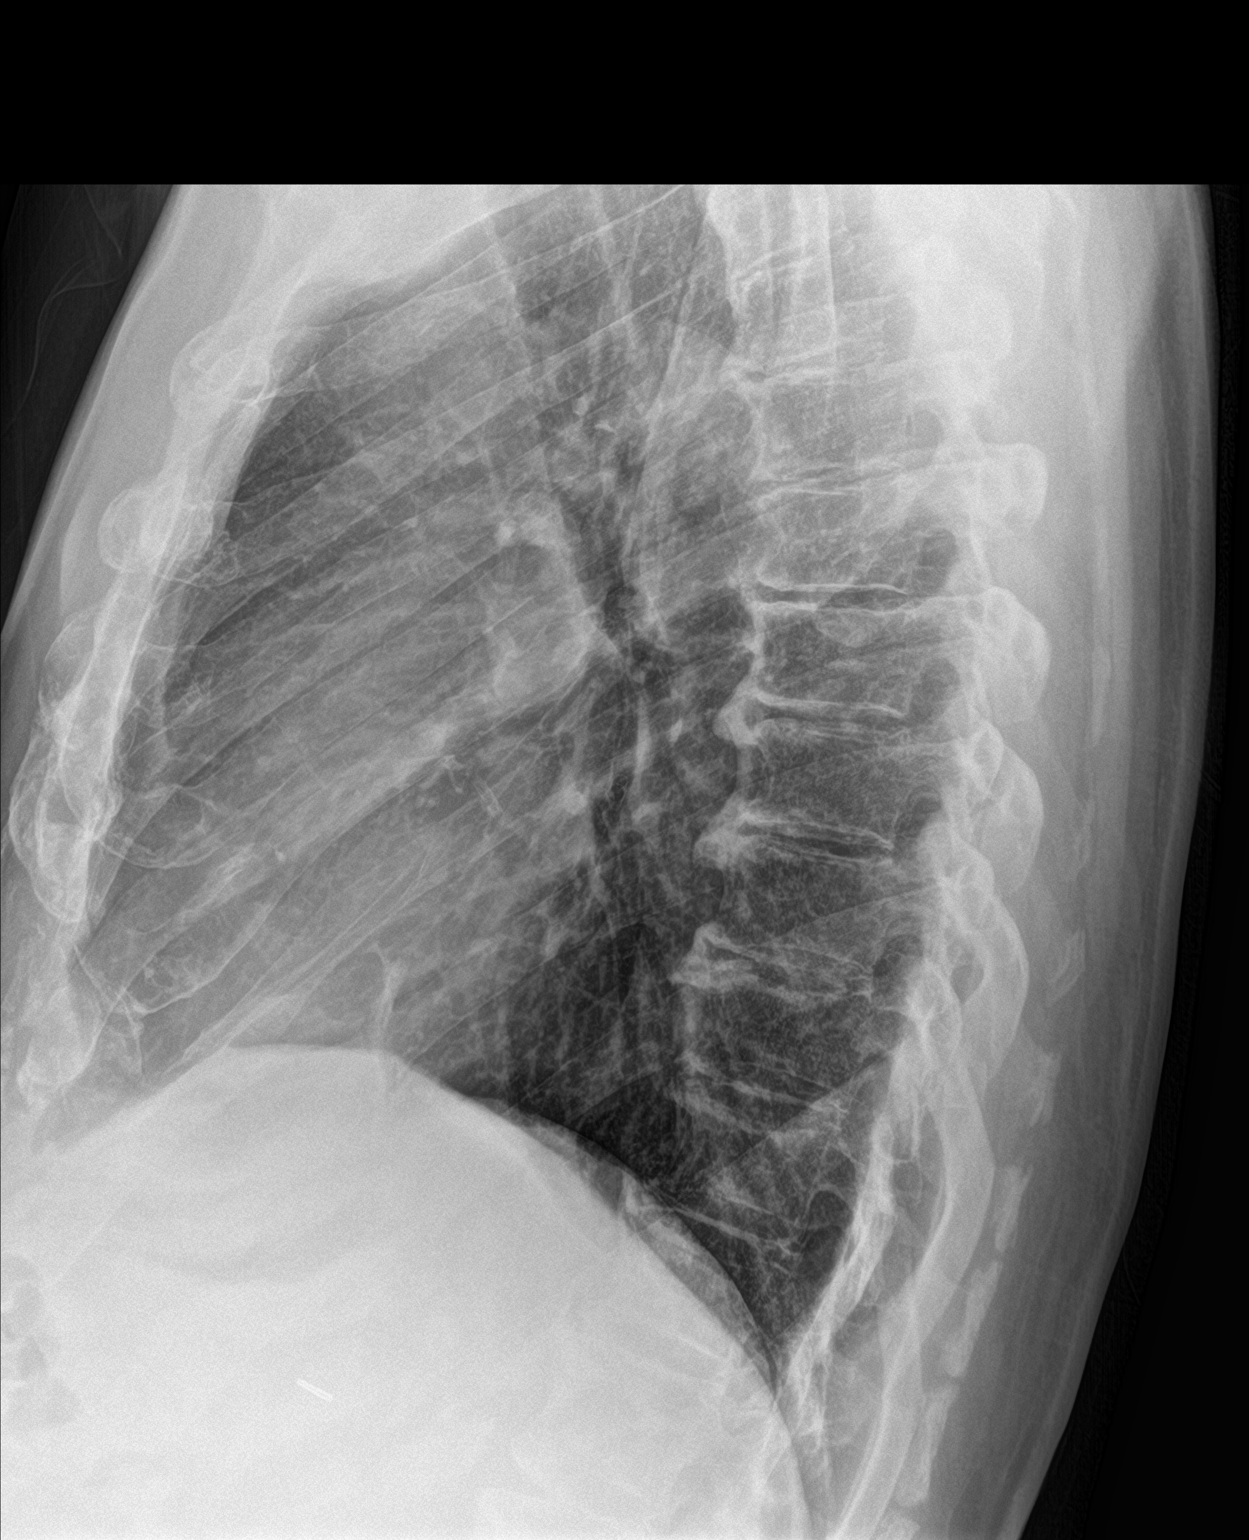

[swimmer]
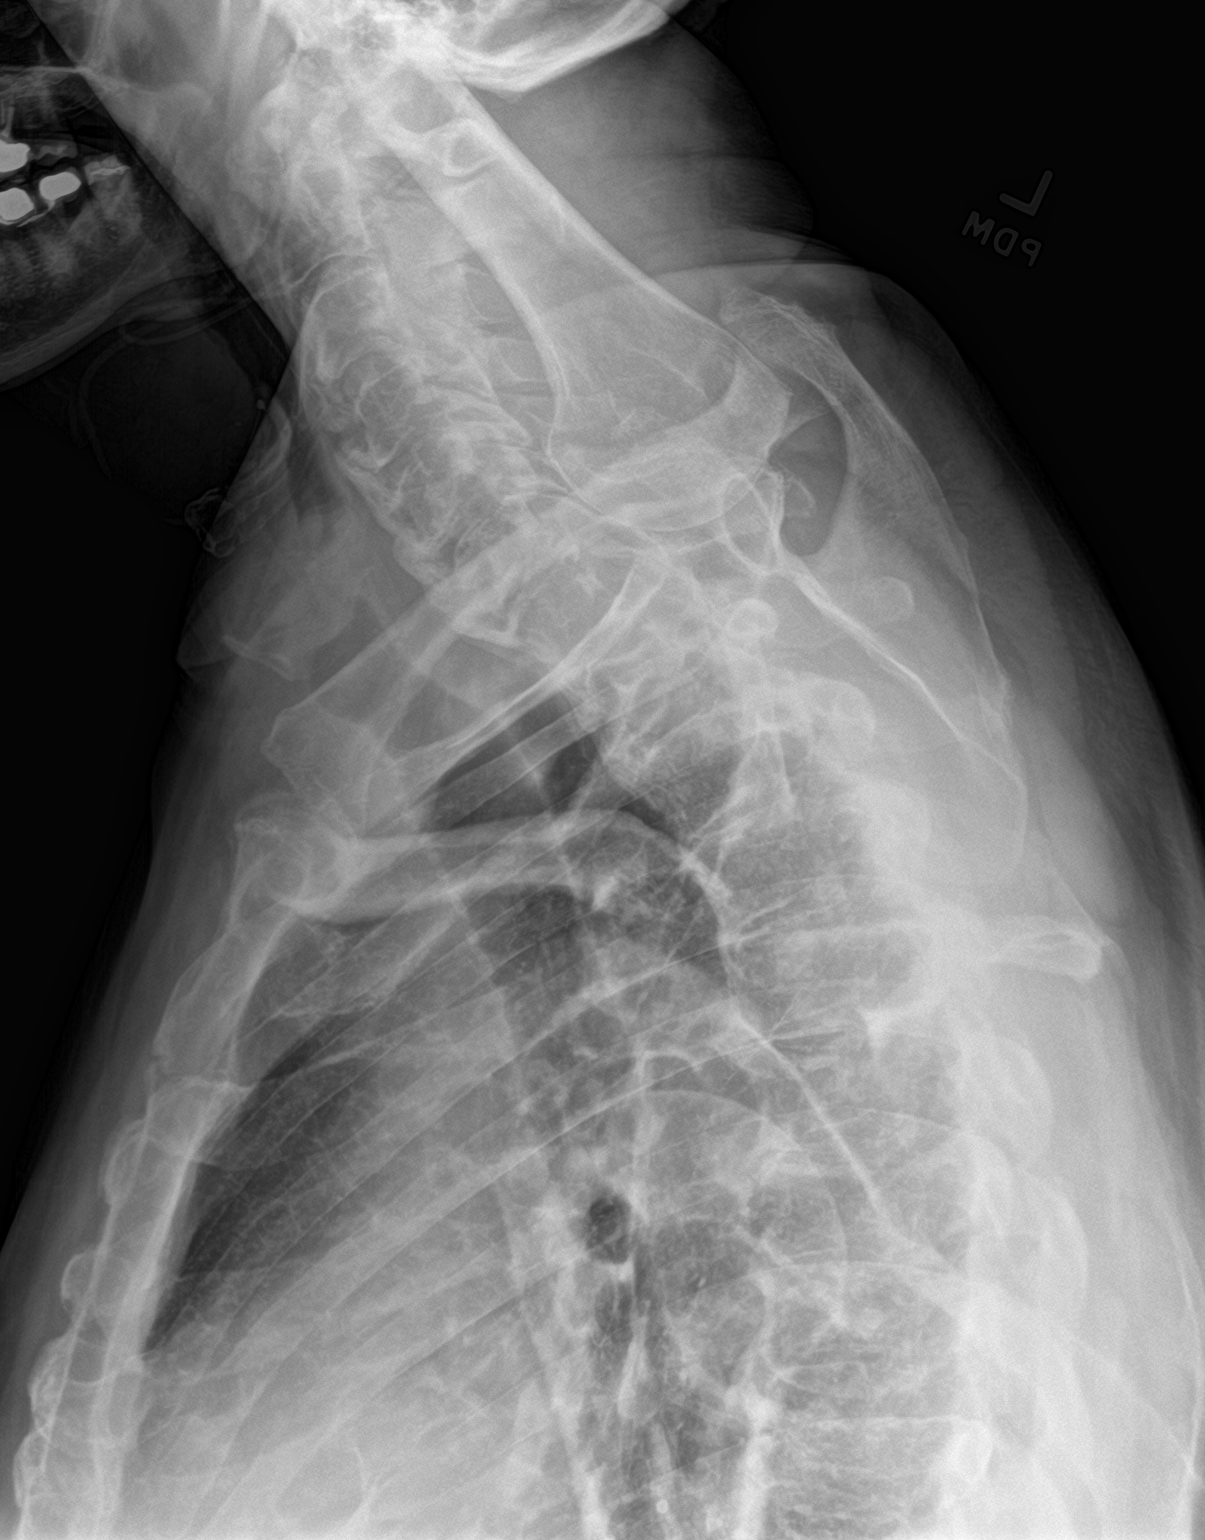

[3 of 3 positions shown; findings below may reference images not displayed]

FINDINGS: Vertebral body alignment, heights and disc space heights are normal.
There is mild spondylosis throughout the thoracic spine. Pedicles
are intact. No compression fracture or subluxation.
IMPRESSION: 1. No acute findings.
2. Mild spondylosis of the thoracic spine.

## 2020-04-28 MED ORDER — CYCLOBENZAPRINE HCL 10 MG PO TABS: 10.0000 mg | ORAL_TABLET | Freq: Every day | ORAL | 0 refills | Status: DC

## 2020-04-28 NOTE — Patient Instructions (Signed)
Go to lab for blood draw Go to [address] for x-ray.  You will be contacted to schedule appt with dermatology and podiatry  Call cardiology to request transfer to another provider.

## 2020-04-28 NOTE — Assessment & Plan Note (Addendum)
Current use of crestor Needs to schedule appt with cardiology due to hx of CAD, last appt 2020.  Repeat lipid panel today:Abnormal lipid panel: elevated triglycerides and low HDL. Continue crestor. It is important to maintain low carb/low fat diet. Will need to repeat this in 3-28months (fasting). Discuss with new pcp and cardiology.

## 2020-04-28 NOTE — Assessment & Plan Note (Signed)
BP at goal with lisinopril and metoprolol He d/c HCTZ BP Readings from Last 3 Encounters:  04/28/20 124/78  09/17/19 124/70  03/09/19 116/72   Repeat BMP Maintain medications

## 2020-04-28 NOTE — Progress Notes (Signed)
Subjective:  Patient ID: Alex Boyle, male    DOB: 04-04-1946  Age: 74 y.o. MRN: [id number]  CC: Acute Visit (foot pain, back pain, HTN f/up, referrals)  Back Pain This is a new problem. The current episode started more than 1 month ago. The problem occurs intermittently. The problem has been waxing and waning since onset. The pain is present in the thoracic spine. The quality of the pain is described as aching. The pain does not radiate. The pain is the same all the time. Pertinent negatives include no chest pain, numbness, paresis, tingling, weakness or weight loss. Risk factors include obesity and poor posture. He has tried nothing for the symptoms.  reports hx of kidney stone and recent heavy lifting at home.  States he plans to schedule transfer of care and CPE appt with Dr. Ethelene Hal.  HTN (hypertension) BP at goal with lisinopril and metoprolol He d/c HCTZ BP Readings from Last 3 Encounters:  04/28/20 124/78  09/17/19 124/70  03/09/19 116/72   Repeat BMP Maintain medications  History of basal cell cancer Previous patient of [facility] but they are now out of network.  Entered referral to [place] [facility]  Hyperlipidemia LDL goal <70 Current use of crestor Needs to schedule appt with cardiology due to hx of CAD, last appt 2020.  Repeat lipid panel today:Abnormal lipid panel: elevated triglycerides and low HDL. Continue crestor. It is important to maintain low carb/low fat diet. Will need to repeat this in 3-93months (fasting). Discuss with new pcp and cardiology.   Reviewed past Medical, Social and Family history today.  Outpatient Medications Prior to Visit  Medication Sig Dispense Refill  . albuterol (PROVENTIL HFA;VENTOLIN HFA) 108 (90 Base) MCG/ACT inhaler Inhale 1-2 puffs into the lungs every 6 (six) hours as needed for wheezing or shortness of breath. 1 Inhaler 12  . aspirin EC 81 MG tablet Take 1 tablet (81 mg total) by mouth daily. 90  tablet 3  . BREO ELLIPTA 100-25 MCG/INH AEPB INHALE ONE PUFF BY MOUTH ONE TIME DAILY 60 each 12  . finasteride (PROSCAR) 5 MG tablet Take 1qd (Plz sched appt with new provider for future fills) 90 tablet 1  . lisinopril (ZESTRIL) 5 MG tablet TAKE ONE TABLET BY MOUTH ONE TIME DAILY 90 tablet 0  . metoprolol tartrate (LOPRESSOR) 25 MG tablet TAKE ONE TABLET BY MOUTH TWICE A DAY 180 tablet 1  . montelukast (SINGULAIR) 10 MG tablet TAKE ONE TABLET BY MOUTH AT BEDTIME 90 tablet 0  . nitroGLYCERIN (NITROSTAT) 0.4 MG SL tablet Place 1 tablet (0.4 mg total) under the tongue every 5 (five) minutes as needed for chest pain (If no relief after 3 doses call 911). 90 tablet 0  . omeprazole (PRILOSEC) 20 MG capsule TAKE TWO CAPSULES BY MOUTH ONE TIME DAILY 180 capsule 0  . rosuvastatin (CRESTOR) 20 MG tablet TAKE ONE TABLET BY MOUTH ONE TIME DAILY 90 tablet 0  . tamsulosin (FLOMAX) 0.4 MG CAPS capsule Take 1qd (Plz sched appt with new provider for future fills) 90 capsule 0  . hydrochlorothiazide (HYDRODIURIL) 25 MG tablet Take 1 tablet (25 mg total) by mouth daily as needed (swelling). Patient needs to call and schedule a one year follow up appointment for further refills 1st attempt 30 tablet 0  . cyclobenzaprine (FLEXERIL) 10 MG tablet Take 1 tablet (10 mg total) by mouth 3 (three) times daily as needed for muscle spasms. 30 tablet 0  . ketoconazole (NIZORAL) 2 % cream Apply 1  application topically daily. 15 g 0   No facility-administered medications prior to visit.    ROS See HPI  Objective:  BP 124/78 (BP Location: Left Arm, Patient Position: Sitting, Cuff Size: Large)   Temp 97.6 F (36.4 C) (Temporal)   Ht 6' (1.829 m)   Wt 226 lb 6.4 oz (102.7 kg)   BMI 30.71 kg/m   Physical Exam Cardiovascular:     Rate and Rhythm: Normal rate and regular rhythm.     Pulses: Normal pulses.     Heart sounds: Normal heart sounds.  Pulmonary:     Effort: Pulmonary effort is normal.     Breath sounds:  Normal breath sounds.  Musculoskeletal:     Cervical back: Normal range of motion and neck supple.     Right lower leg: No edema.     Left lower leg: No edema.  Feet:     Right foot:     Skin integrity: Callus present.     Toenail Condition: Right toenails are abnormally thick.     Left foot:     Toenail Condition: Left toenails are abnormally thick.     Comments: Hammer toes on right foot. Lymphadenopathy:     Cervical: No cervical adenopathy.  Neurological:     Mental Status: He is alert and oriented to person, place, and time.    Assessment & Plan:  This visit occurred during the SARS-CoV-2 public health emergency.  Safety protocols were in place, including screening questions prior to the visit, additional usage of staff PPE, and extensive cleaning of exam room while observing appropriate contact time as indicated for disinfecting solutions.   Alex Boyle was seen today for acute visit.  Diagnoses and all orders for this visit:  Acute right-sided thoracic back pain -     cyclobenzaprine (FLEXERIL) 10 MG tablet; Take 1 tablet (10 mg total) by mouth at bedtime. -     Cancel: DG Thoracic Spine W/Swimmers -     Urinalysis w microscopic + reflex cultur -     DG Thoracic Spine W/Swimmers; Future  Primary hypertension -     Basic metabolic panel  History of basal cell cancer -     Ambulatory referral to Dermatology  Hyperlipidemia LDL goal <70 -     Lipid panel -     Hepatic function panel  Hammer toe of right foot -     Ambulatory referral to Podiatry  Other orders -     LDL cholesterol, direct -     REFLEXIVE URINE CULTURE  Normal urinalysis, renal and liver function Abnormal lipid panel: elevated triglycerides and low HDL. Continue crestor. It is important to maintain low carb/low fat diet. Will need to repeat this in 3-61months (fasting). Discuss with new pcp and cardiology.  Problem List Items Addressed This Visit      Cardiovascular and Mediastinum   HTN  (hypertension)    BP at goal with lisinopril and metoprolol He d/c HCTZ BP Readings from Last 3 Encounters:  04/28/20 124/78  09/17/19 124/70  03/09/19 116/72   Repeat BMP Maintain medications      Relevant Orders   Basic metabolic panel (Completed)     Musculoskeletal and Integument   History of basal cell cancer    Previous patient of [facility] but they are now out of network.  Entered referral to [place] [facility]      Relevant Orders   Ambulatory referral to Dermatology     Other   Hyperlipidemia LDL  goal <70    Current use of crestor Needs to schedule appt with cardiology due to hx of CAD, last appt 2020.  Repeat lipid panel today:Abnormal lipid panel: elevated triglycerides and low HDL. Continue crestor. It is important to maintain low carb/low fat diet. Will need to repeat this in 3-29months (fasting). Discuss with new pcp and cardiology.      Relevant Orders   Lipid panel (Completed)   Hepatic function panel (Completed)    Other Visit Diagnoses    Acute right-sided thoracic back pain    -  Primary   Relevant Medications   cyclobenzaprine (FLEXERIL) 10 MG tablet   Other Relevant Orders   Urinalysis w microscopic + reflex cultur (Completed)   DG Thoracic Spine W/Swimmers (Completed)   Hammer toe of right foot       Relevant Orders   Ambulatory referral to Podiatry      Follow-up: No follow-ups on file.  Wilfred Lacy, NP

## 2020-04-28 NOTE — Assessment & Plan Note (Signed)
Previous patient of [facility] but they are now out of network.  Entered referral to [facility] dermatology

## 2020-04-29 LAB — URINALYSIS W MICROSCOPIC + REFLEX CULTURE
Bacteria, UA: NONE SEEN /HPF
Bilirubin Urine: NEGATIVE
Glucose, UA: NEGATIVE
Hgb urine dipstick: NEGATIVE
Hyaline Cast: NONE SEEN /LPF
Leukocyte Esterase: NEGATIVE
Nitrites, Initial: NEGATIVE
Protein, ur: NEGATIVE
RBC / HPF: NONE SEEN /HPF (ref 0–2)
Specific Gravity, Urine: 1.02 (ref 1.001–1.03)
Squamous Epithelial / HPF: NONE SEEN /HPF (ref <=5)
WBC, UA: NONE SEEN /HPF (ref 0–5)
pH: 6 (ref 5.0–8.0)

## 2020-04-29 LAB — NO CULTURE INDICATED

## 2020-05-03 DIAGNOSIS — G4733 Obstructive sleep apnea (adult) (pediatric): Secondary | ICD-10-CM | POA: Diagnosis not present

## 2020-05-12 ENCOUNTER — Ambulatory Visit (INDEPENDENT_AMBULATORY_CARE_PROVIDER_SITE_OTHER): Payer: Federal, State, Local not specified - PPO | Admitting: Podiatry

## 2020-05-12 ENCOUNTER — Encounter: Payer: Self-pay | Admitting: Podiatry

## 2020-05-12 ENCOUNTER — Ambulatory Visit (INDEPENDENT_AMBULATORY_CARE_PROVIDER_SITE_OTHER): Payer: Federal, State, Local not specified - PPO

## 2020-05-12 ENCOUNTER — Other Ambulatory Visit: Payer: Self-pay

## 2020-05-12 DIAGNOSIS — M2042 Other hammer toe(s) (acquired), left foot: Secondary | ICD-10-CM | POA: Diagnosis not present

## 2020-05-12 DIAGNOSIS — Q828 Other specified congenital malformations of skin: Secondary | ICD-10-CM

## 2020-05-12 DIAGNOSIS — M2041 Other hammer toe(s) (acquired), right foot: Secondary | ICD-10-CM

## 2020-05-12 DIAGNOSIS — M21961 Unspecified acquired deformity of right lower leg: Secondary | ICD-10-CM

## 2020-05-14 NOTE — Progress Notes (Signed)
Subjective:  Patient ID: Alex Boyle, male    DOB: [birth year],  MRN: [id number] HPI Chief Complaint  Patient presents with  . Foot Pain    2nd toe and 5th toe right - fractured both previously, hammer toe deformities, 5th leans to the outside, both rub enclosed shoes, painful to walk, has had multiple ankle surgeries right and left knee replacement caused his left leg to be shorter, also has some numbness and tingling and arch pain in both feet  . New Patient (Initial Visit)    74 y.o. male presents with the above complaint.   ROS: Denies fever chills nausea vomiting muscle aches pains calf pain back pain chest pain shortness of breath.  He has cardiac history but currently does not have a cardiologist.  Nor does he have a primary care doctor.  Past Medical History:  Diagnosis Date  . Allergy   . Arthritis   . Asthma   . Diverticulitis   . Hyperlipidemia   . Hypertension   . Myocardial infarction ([place])   . Sleep apnea   . Sleep apnea in adult    Past Surgical History:  Procedure Laterality Date  . COLON SURGERY    . CORONARY STENT PLACEMENT     x2  . JOINT REPLACEMENT     knee  . LAPAROSCOPIC TRANSABDOMINAL HERNIA    . MANDIBLE SURGERY    . skin cancer     basil cell removed on back    Current Outpatient Medications:  .  albuterol (PROVENTIL HFA;VENTOLIN HFA) 108 (90 Base) MCG/ACT inhaler, Inhale 1-2 puffs into the lungs every 6 (six) hours as needed for wheezing or shortness of breath., Disp: 1 Inhaler, Rfl: 12 .  aspirin EC 81 MG tablet, Take 1 tablet (81 mg total) by mouth daily., Disp: 90 tablet, Rfl: 3 .  BREO ELLIPTA 100-25 MCG/INH AEPB, INHALE ONE PUFF BY MOUTH ONE TIME DAILY, Disp: 60 each, Rfl: 12 .  cyclobenzaprine (FLEXERIL) 10 MG tablet, Take 1 tablet (10 mg total) by mouth at bedtime., Disp: 7 tablet, Rfl: 0 .  finasteride (PROSCAR) 5 MG tablet, Take 1qd (Plz sched appt with new provider for future fills), Disp: 90 tablet, Rfl: 1 .  lisinopril  (ZESTRIL) 5 MG tablet, TAKE ONE TABLET BY MOUTH ONE TIME DAILY, Disp: 90 tablet, Rfl: 0 .  metoprolol tartrate (LOPRESSOR) 25 MG tablet, TAKE ONE TABLET BY MOUTH TWICE A DAY, Disp: 180 tablet, Rfl: 1 .  montelukast (SINGULAIR) 10 MG tablet, TAKE ONE TABLET BY MOUTH AT BEDTIME, Disp: 90 tablet, Rfl: 0 .  nitroGLYCERIN (NITROSTAT) 0.4 MG SL tablet, Place 1 tablet (0.4 mg total) under the tongue every 5 (five) minutes as needed for chest pain (If no relief after 3 doses call 911)., Disp: 90 tablet, Rfl: 0 .  omeprazole (PRILOSEC) 20 MG capsule, TAKE TWO CAPSULES BY MOUTH ONE TIME DAILY, Disp: 180 capsule, Rfl: 0 .  rosuvastatin (CRESTOR) 20 MG tablet, TAKE ONE TABLET BY MOUTH ONE TIME DAILY, Disp: 90 tablet, Rfl: 0 .  tamsulosin (FLOMAX) 0.4 MG CAPS capsule, Take 1qd (Plz sched appt with new provider for future fills), Disp: 90 capsule, Rfl: 0  Allergies  Allergen Reactions  . Codeine Shortness Of Breath  . Penicillins Swelling   Review of Systems Objective:  There were no vitals filed for this visit.  General: Well developed, nourished, in no acute distress, alert and oriented x3   Dermatological: Skin is warm, dry and supple bilateral. Nails x 10 are well  maintained; remaining integument appears unremarkable at this time. There are no open sores, no preulcerative lesions, no rash or signs of infection present.  Benign hyperkeratotic lesions plantar aspect of the forefoot bilaterally.  Vascular: Dorsalis Pedis artery and Posterior Tibial artery pedal pulses are 2/4 bilateral with immedate capillary fill time. Pedal hair growth present. No varicosities and no lower extremity edema present bilateral.   Neruologic: Grossly intact via light touch bilateral. Vibratory intact via tuning fork bilateral. Protective threshold with Semmes Wienstein monofilament intact to all pedal sites bilateral. Patellar and Achilles deep tendon reflexes 2+ bilateral. No Babinski or clonus noted bilateral.    Musculoskeletal: No gross boney pedal deformities bilateral. No pain, crepitus, or limitation noted with foot and ankle range of motion bilateral. Muscular strength 5/5 in all groups tested bilateral.  Gait: Unassisted, Nonantalgic.    Radiographs:  Radiographs taken today osseously mature foot bilaterally.  Demonstrates ankle joint arthritis right.  Posterior and plantar calcaneal heel spurs are noted.  Some early osteoarthritic changes of the midfoot particularly on the right.  Left foot appears to be relatively rectus and however the right foot does demonstrate significant osteoarthritic changes of the head of the first metatarsal at the metatarsophalangeal joint elongated plantarflexed second metatarsal with medial deviation of the second toe.  An old fracture at the PIPJ third digit of the right foot demonstrating as osteoarthritic change and rotation of the toe.  Similarly to the fourth toe and the fifth.  Though the fourth is much more rectus but arthritic.  No acute osseous abnormalities.  Radiographically he also has osteoarthritic changes of the first metatarsal medial cuneiform joint.  Assessment & Plan:   Assessment: Plantarflexed elongated second metatarsal with medial deviation of the toe and hammertoe deformities #2 #3 #5.  Plan: Porokeratosis bilaterally.  Benign skin lesions bilateral.  Osteoarthritis of the right ankle  Plan: Discussed etiology pathology conservative versus surgical therapies.  At this point he would like to consider surgical intervention.  I expressed to him that he would need primary care and cardiac clearance.  He understands this and is amendable to it.  I expressed to him that it would consist of a second metatarsal osteotomy with a hammertoe repair at the level of the PIPJ second third toes.  These toes would be pinned when or screwed into position.  He understands and is amenable to it.  He also understands he has a derotational arthroplasty fifth  digit of the right foot.  We did discuss the possible postop complications which may include but not limited to postop pain bleeding swelling infection recurrence need for further surgery overcorrection under correction loss of digit loss of limb loss of life.  He understands this is amendable to it signed a 3 page of the consent form and we will follow-up with him once surgery is scheduled and his clearances have been made.  We dispensed information regarding the surgery center anesthesia group and information regarding the morning of surgery.  I debrided all reactive hyperkeratotic tissue today.     Charly Hunton T. [place], [place]

## 2020-05-16 ENCOUNTER — Telehealth: Payer: Self-pay | Admitting: Cardiology

## 2020-05-16 NOTE — Telephone Encounter (Signed)
   [facility] Pre-operative Risk Assessment    HEARTCARE STAFF: - Please ensure there is not already an duplicate clearance open for this procedure. - Under Visit Info/Reason for Call, type in Other and utilize the format Clearance MM/DD/YY or Clearance TBD. Do not use dashes or single digits. - If request is for dental extraction, please clarify the # of teeth to be extracted.  Request for surgical clearance:  1. What type of surgery is being performed? Metatarsal Osteotomy second, third, and fifth toe right foot hammer toe repair   2. When is this surgery scheduled? 06/10/20  3. What type of clearance is required (medical clearance vs. Pharmacy clearance to hold med vs. Both)? Both   4. Are there any medications that need to be held prior to surgery and how long? TBD by Cardiologist   5. Practice name and name of physician performing surgery? [facility], Dr. Roselind Messier   6. What is the office phone number? [phone]   7.   What is the office fax number? [phone]  8.   Anesthesia type (None, local, MAC, general) ? General    Trilby Drummer 05/16/2020, 1:21 PM  _________________________________________________________________   (provider comments below)

## 2020-05-16 NOTE — Telephone Encounter (Signed)
Need appt with Dr Radford Pax or APP for pre op clearance

## 2020-05-16 NOTE — Telephone Encounter (Signed)
   Primary Cardiologist: Dr. Radford Pax   Chart reviewed as part of pre-operative protocol coverage.  Patient has not been seen by our office since 02/2019 and this was a virtual visit. Because of Riddick Nuon Schmutz's past medical history and time since last visit, he will require a follow-up visit in order to better assess preoperative cardiovascular risk.  Pre-op covering staff: - Please schedule appointment and call patient to inform them. If patient already had an upcoming appointment within acceptable timeframe, please add "pre-op clearance" to the appointment notes so provider is aware. - Please contact requesting surgeon's office via preferred method (i.e, phone, fax) to inform them of need for appointment prior to surgery.  If applicable, this message will also be routed to pharmacy pool and/or primary cardiologist for input on holding anticoagulant/antiplatelet agent as requested below so that this information is available to the clearing provider at time of patient's appointment.   Darreld Mclean, PA-C  05/16/2020, 1:54 PM

## 2020-05-17 ENCOUNTER — Telehealth: Payer: Self-pay | Admitting: Podiatry

## 2020-05-17 NOTE — Telephone Encounter (Addendum)
DOS: 06/10/2020  Procedures: Metatarsal Osteotomy 2nd Rt (28308) and Hammertoe Repair 2nd, 3rd, and 5th Rt (84536)  Federal BCBS Primary and Medicare Part A & B Secondary  Federal BCBS Effective From 07/27/2018 - 06/10/9998  Deductible: $0 Out of Pocket: $5,500 with $1,194 met and $4,306 remaining. CoInsurance: 100% / 30% Copay: $100 / $0  Per [employer] no Prior Authorization is required.

## 2020-05-17 NOTE — Telephone Encounter (Signed)
Pt has been scheduled to see Robbie Lis, PA-C, in the [facility], 05/19/2020 arriving at 10:30 and clearance will be addressed at that time.  Will route back to the requesting surgeon's office to make them aware.

## 2020-05-19 ENCOUNTER — Encounter: Payer: Self-pay | Admitting: Physician Assistant

## 2020-05-19 ENCOUNTER — Other Ambulatory Visit: Payer: Self-pay

## 2020-05-19 ENCOUNTER — Ambulatory Visit (INDEPENDENT_AMBULATORY_CARE_PROVIDER_SITE_OTHER): Payer: Federal, State, Local not specified - PPO | Admitting: Physician Assistant

## 2020-05-19 VITALS — BP 118/78 | HR 61 | Ht 70.5 in | Wt 225.1 lb

## 2020-05-19 DIAGNOSIS — I10 Essential (primary) hypertension: Secondary | ICD-10-CM

## 2020-05-19 DIAGNOSIS — E785 Hyperlipidemia, unspecified: Secondary | ICD-10-CM | POA: Diagnosis not present

## 2020-05-19 DIAGNOSIS — E781 Pure hyperglyceridemia: Secondary | ICD-10-CM | POA: Diagnosis not present

## 2020-05-19 DIAGNOSIS — Z01818 Encounter for other preprocedural examination: Secondary | ICD-10-CM

## 2020-05-19 DIAGNOSIS — I2510 Atherosclerotic heart disease of native coronary artery without angina pectoris: Secondary | ICD-10-CM

## 2020-05-19 NOTE — Patient Instructions (Signed)
Medication Instructions:  Your physician recommends that you continue on your current medications as directed. Please refer to the Current Medication list given to you today.  *If you need a refill on your cardiac medications before your next appointment, please call your pharmacy*   Lab Work: None ordered  If you have labs (blood work) drawn today and your tests are completely normal, you will receive your results only by: Marland Kitchen MyChart Message (if you have MyChart) OR . A paper copy in the mail If you have any lab test that is abnormal or we need to change your treatment, we will call you to review the results.   Testing/Procedures: None ordered   Follow-Up: At [facility], you and your health needs are our priority.  As part of our continuing mission to provide you with exceptional heart care, we have created designated Provider Care Teams.  These Care Teams include your primary Cardiologist (physician) and Advanced Practice Providers (APPs -  Physician Assistants and Nurse Practitioners) who all work together to provide you with the care you need, when you need it.  We recommend signing up for the patient portal called "MyChart".  Sign up information is provided on this After Visit Summary.  MyChart is used to connect with patients for Virtual Visits (Telemedicine).  Patients are able to view lab/test results, encounter notes, upcoming appointments, etc.  Non-urgent messages can be sent to your provider as well.   To learn more about what you can do with MyChart, go to [email or web address].    Your next appointment:   12 month(s)  The format for your next appointment:   In Person  Provider:   You may see Fransico Him, MD or one of the following Advanced Practice Providers on your designated Care Team:    Melina Copa, PA-C  Ermalinda Barrios, PA-C    Other Instructions

## 2020-05-19 NOTE — Progress Notes (Signed)
Cardiology Office Note:    Date:  05/19/2020   ID:  Alex Boyle, Alex Boyle 08/04/1945, MRN [id number]  PCP:  Alex Maw, MD  [facility] Cardiologist:  Alex Him, MD    Chief Complaint: pre-op clearance for metatarsal Osteotomy second, third, and fifth toe right foot hammer toe repair    History of Present Illness:    Alex Boyle is a 74 y.o. male with a hx of with a history of CAD s/p remote MI with PCI x 2, HTN, hyperlipidemia, OSA and asthma presents for surgical clearance.  He is s/premote MI in 2012 at which time he had total occlusion of the LAD and underwent PCI. He was also noted to have high-grade lesion in his left circumflex but was medically managed. Several months later he had a follow-up nuclear stress test and collapsed on the treadmill. He underwent PCI of the left circumflex. His last cath was October 2016 which showed widely patent stents and no other CAD.  He established care with Alex Boyle September 2020 after moving from [facility].  He was doing relatively well on cardiac standpoint at that time.  Patient here today for surgical clearance.  Patient is very active.  He was doing hiking and running but unable to do so in past 1 year secondary to arthritis foot pain.  However, he is walking 40 minutes 3 to 4 days/week without chest pain or shortness of breath.  He has gained weight due to dietary noncompliance.  He has enrolled in [place] trial through [facility].  He denies orthopnea, PND, syncope, lower extremity edema, dizziness, palpitation or melena.  Past Medical History:  Diagnosis Date  . Allergy   . Arthritis   . Asthma   . Diverticulitis   . Hyperlipidemia   . Hypertension   . Myocardial infarction ([place])   . Sleep apnea   . Sleep apnea in adult     Past Surgical History:  Procedure Laterality Date  . COLON SURGERY    . CORONARY STENT PLACEMENT     x2  . JOINT REPLACEMENT     knee  . LAPAROSCOPIC  TRANSABDOMINAL HERNIA    . MANDIBLE SURGERY    . skin cancer     basil cell removed on back    Current Medications: Current Meds  Medication Sig  . albuterol (PROVENTIL HFA;VENTOLIN HFA) 108 (90 Base) MCG/ACT inhaler Inhale 1-2 puffs into the lungs every 6 (six) hours as needed for wheezing or shortness of breath.  Marland Kitchen aspirin EC 81 MG tablet Take 1 tablet (81 mg total) by mouth daily.  Marland Kitchen BREO ELLIPTA 100-25 MCG/INH AEPB INHALE ONE PUFF BY MOUTH ONE TIME DAILY  . finasteride (PROSCAR) 5 MG tablet Take 1qd (Plz sched appt with new provider for future fills)  . lisinopril (ZESTRIL) 5 MG tablet TAKE ONE TABLET BY MOUTH ONE TIME DAILY  . metoprolol tartrate (LOPRESSOR) 25 MG tablet TAKE ONE TABLET BY MOUTH TWICE A DAY  . montelukast (SINGULAIR) 10 MG tablet TAKE ONE TABLET BY MOUTH AT BEDTIME  . nitroGLYCERIN (NITROSTAT) 0.4 MG SL tablet Place 1 tablet (0.4 mg total) under the tongue every 5 (five) minutes as needed for chest pain (If no relief after 3 doses call 911).  Marland Kitchen omeprazole (PRILOSEC) 20 MG capsule TAKE TWO CAPSULES BY MOUTH ONE TIME DAILY  . rosuvastatin (CRESTOR) 20 MG tablet TAKE ONE TABLET BY MOUTH ONE TIME DAILY  . tamsulosin (FLOMAX) 0.4 MG CAPS capsule Take 1qd (Plz  sched appt with new provider for future fills)     Allergies:   Codeine and Penicillins   Social History   Socioeconomic History  . Marital status: Married    Spouse name: Not on file  . Number of children: Not on file  . Years of education: Not on file  . Highest education level: Not on file  Occupational History  . Not on file  Tobacco Use  . Smoking status: Never Smoker  . Smokeless tobacco: Never Used  Vaping Use  . Vaping Use: Former  Substance and Sexual Activity  . Alcohol use: Yes    Comment: soically   . Drug use: Never  . Sexual activity: Yes    Birth control/protection: None  Other Topics Concern  . Not on file  Social History Narrative  . Not on file   Social Determinants of Health    Financial Resource Strain: Not on file  Food Insecurity: Not on file  Transportation Needs: Not on file  Physical Activity: Not on file  Stress: Not on file  Social Connections: Not on file     Family History: The patient's family history includes Alzheimer's disease in his mother; Arthritis in his mother; Heart disease in his mother; Hypertension in his mother. He was adopted.    ROS:   Please see the history of present illness.    All other systems reviewed and are negative.  EKGs/Labs/Other Studies Reviewed:    The following studies were reviewed today: As Above  EKG:  EKG is  ordered today.  The ekg ordered today demonstrates normal sinus rhythm at rate of 61 bpm  Recent Labs: 04/28/2020: ALT 31; BUN 11; Creatinine, Ser 0.89; Potassium 4.4; Sodium 139  Recent Lipid Panel    Component Value Date/Time   CHOL 162 04/28/2020 1421   TRIG 268.0 (H) 04/28/2020 1421   HDL 38.60 (L) 04/28/2020 1421   CHOLHDL 4 04/28/2020 1421   VLDL 53.6 (H) 04/28/2020 1421   LDLCALC 79 10/31/2018 1100   LDLDIRECT 94.0 04/28/2020 1421   Physical Exam:    VS:  BP 118/78   Pulse 61   Ht 5' 10.5" (1.791 m)   Wt 225 lb 1.3 oz (102.1 kg)   SpO2 98%   BMI 31.84 kg/m     Wt Readings from Last 3 Encounters:  05/19/20 225 lb 1.3 oz (102.1 kg)  04/28/20 226 lb 6.4 oz (102.7 kg)  09/17/19 227 lb 3.2 oz (103.1 kg)   GEN: Well nourished, well developed in no acute distress HEENT: Normal NECK: No JVD; No carotid bruits LYMPHATICS: No lymphadenopathy CARDIAC: RRR, no murmurs, rubs, gallops RESPIRATORY:  Clear to auscultation without rales, wheezing or rhonchi  ABDOMEN: Soft, non-tender, non-distended MUSCULOSKELETAL:  No edema; No deformity  SKIN: Warm and dry NEUROLOGIC:  Alert and oriented x 3 PSYCHIATRIC:  Normal affect   ASSESSMENT AND PLAN:    1. CAD No angina.  Continue aspirin, statin and beta-blocker.  2. HTN Blood pressure stable and well-controlled on current  medications  3. HLD with hypertriglyceridemia -04/28/2020: Cholesterol 162; HDL 38.60; Triglycerides 268.0; VLDL 53.6 -Continue Crestor 20 mg daily -He is enrolled in [employer] dietary trial -He is planning to get more active after his surgery -Followed by PCP  4.  Surgical clearance -He is getting at least 4 METS of activity without anginal symptoms. Given past medical history and time since last visit, based on ACC/AHA guidelines, Darcell Sabino would be at acceptable risk for the planned procedure  without further cardiovascular testing. He can hold ASA for 5-7 day if needed.   I will route this recommendation to the requesting party via Epic fax function and remove from pre-op pool.  Please call with questions.  Medication Adjustments/Labs and Tests Ordered: Current medicines are reviewed at length with the patient today.  Concerns regarding medicines are outlined above.  Orders Placed This Encounter  Procedures  . EKG 12-Lead   No orders of the defined types were placed in this encounter.   Patient Instructions  Medication Instructions:  Your physician recommends that you continue on your current medications as directed. Please refer to the Current Medication list given to you today.  *If you need a refill on your cardiac medications before your next appointment, please call your pharmacy*   Lab Work: None ordered  If you have labs (blood work) drawn today and your tests are completely normal, you will receive your results only by: Marland Kitchen MyChart Message (if you have MyChart) OR . A paper copy in the mail If you have any lab test that is abnormal or we need to change your treatment, we will call you to review the results.   Testing/Procedures: None ordered   Follow-Up: At [facility], you and your health needs are our priority.  As part of our continuing mission to provide you with exceptional heart care, we have created designated Provider Care Teams.  These Care  Teams include your primary Cardiologist (physician) and Advanced Practice Providers (APPs -  Physician Assistants and Nurse Practitioners) who all work together to provide you with the care you need, when you need it.  We recommend signing up for the patient portal called "MyChart".  Sign up information is provided on this After Visit Summary.  MyChart is used to connect with patients for Virtual Visits (Telemedicine).  Patients are able to view lab/test results, encounter notes, upcoming appointments, etc.  Non-urgent messages can be sent to your provider as well.   To learn more about what you can do with MyChart, go to [email or web address].    Your next appointment:   12 month(s)  The format for your next appointment:   In Person  Provider:   You may see Alex Him, MD or one of the following Advanced Practice Providers on your designated Care Team:    Melina Copa, PA-C  Ermalinda Barrios, PA-C    Other Instructions     Signed, Leanor Kail, [place]  05/19/2020 11:04 AM    [place]

## 2020-06-02 DIAGNOSIS — G4733 Obstructive sleep apnea (adult) (pediatric): Secondary | ICD-10-CM | POA: Diagnosis not present

## 2020-06-08 ENCOUNTER — Other Ambulatory Visit: Payer: Self-pay | Admitting: Podiatry

## 2020-06-08 MED ORDER — TRAMADOL HCL 50 MG PO TABS
50.0000 mg | ORAL_TABLET | Freq: Three times a day (TID) | ORAL | 0 refills | Status: AC | PRN
Start: 2020-06-08 — End: 2020-06-13

## 2020-06-08 MED ORDER — CLINDAMYCIN HCL 150 MG PO CAPS: 150.0000 mg | ORAL_CAPSULE | Freq: Three times a day (TID) | ORAL | 0 refills | Status: DC

## 2020-06-08 MED ORDER — ONDANSETRON HCL 4 MG PO TABS: 4.0000 mg | ORAL_TABLET | Freq: Three times a day (TID) | ORAL | 0 refills | Status: DC | PRN

## 2020-06-09 ENCOUNTER — Other Ambulatory Visit: Payer: Self-pay | Admitting: Interventional Cardiology

## 2020-06-09 ENCOUNTER — Other Ambulatory Visit: Payer: Self-pay | Admitting: Family Medicine

## 2020-06-10 DIAGNOSIS — M25571 Pain in right ankle and joints of right foot: Secondary | ICD-10-CM | POA: Diagnosis not present

## 2020-06-10 DIAGNOSIS — M21541 Acquired clubfoot, right foot: Secondary | ICD-10-CM | POA: Diagnosis not present

## 2020-06-10 DIAGNOSIS — M2041 Other hammer toe(s) (acquired), right foot: Secondary | ICD-10-CM | POA: Diagnosis not present

## 2020-06-10 DIAGNOSIS — M21621 Bunionette of right foot: Secondary | ICD-10-CM | POA: Diagnosis not present

## 2020-06-16 ENCOUNTER — Other Ambulatory Visit: Payer: Self-pay

## 2020-06-16 ENCOUNTER — Ambulatory Visit (INDEPENDENT_AMBULATORY_CARE_PROVIDER_SITE_OTHER): Payer: Federal, State, Local not specified - PPO | Admitting: Podiatry

## 2020-06-16 ENCOUNTER — Ambulatory Visit (INDEPENDENT_AMBULATORY_CARE_PROVIDER_SITE_OTHER): Payer: Federal, State, Local not specified - PPO

## 2020-06-16 DIAGNOSIS — Z9889 Other specified postprocedural states: Secondary | ICD-10-CM

## 2020-06-16 DIAGNOSIS — M2041 Other hammer toe(s) (acquired), right foot: Secondary | ICD-10-CM

## 2020-06-16 DIAGNOSIS — M21961 Unspecified acquired deformity of right lower leg: Secondary | ICD-10-CM

## 2020-06-16 MED ORDER — TRAMADOL HCL 50 MG PO TABS: 50.0000 mg | ORAL_TABLET | Freq: Three times a day (TID) | ORAL | 0 refills | Status: AC | PRN

## 2020-06-16 NOTE — Progress Notes (Signed)
He presents today for his first postop visit that date of surgery was 06/10/2020.  He states that he is doing quite well pain is controlled well with the tramadol.  States that he has not had any chest pain leg pain calf pain shortness of breath.  Denies any trauma other than his dog stepping on his foot.  Objective: Vital signs are stable he is alert oriented x3 presents today with his cam boot on and intact appears to be clean.  Was removed demonstrates dressed a compressive dressing intact was removed demonstrates 2 K wires to the second and third toes.  Sutures are intact second third and fifth toes right.  Mild edema mild ecchymosis mild erythema no cellulitis drainage or odor.  Radiographs taken today demonstrate osseously mature individual second metatarsal osteotomy in good position parabola appears to be normal.  K wire extends through the second toe and into the head of the second metatarsal.  K wire to the third toe does not cross the metatarsophalangeal joint.  Exostectomy fifth digit and arthroplasty fifth digit is intact and in good position.  Assessment well-healing surgical foot x1 week.  Plan: Redressed today dressed a compressive dressing follow-up with him in 1 week for potential suture removal.  I did refill his tramadol and encouraged him to take Tylenol with it to increase its efficacy and its longevity.

## 2020-06-28 ENCOUNTER — Encounter: Payer: Federal, State, Local not specified - PPO | Admitting: Podiatry

## 2020-06-30 ENCOUNTER — Encounter: Payer: Self-pay | Admitting: Podiatry

## 2020-06-30 ENCOUNTER — Ambulatory Visit (INDEPENDENT_AMBULATORY_CARE_PROVIDER_SITE_OTHER): Payer: Federal, State, Local not specified - PPO | Admitting: Podiatry

## 2020-06-30 ENCOUNTER — Other Ambulatory Visit: Payer: Self-pay

## 2020-06-30 DIAGNOSIS — M2041 Other hammer toe(s) (acquired), right foot: Secondary | ICD-10-CM

## 2020-06-30 DIAGNOSIS — Z9889 Other specified postprocedural states: Secondary | ICD-10-CM

## 2020-06-30 DIAGNOSIS — M21961 Unspecified acquired deformity of right lower leg: Secondary | ICD-10-CM

## 2020-06-30 MED ORDER — NITROGLYCERIN 0.2 MG/HR TD PT24: 0.2000 mg | MEDICATED_PATCH | Freq: Every day | TRANSDERMAL | 12 refills | Status: DC

## 2020-06-30 NOTE — Progress Notes (Signed)
He presents today for a second postop visit date of surgery 06/10/2020 states that his foot really does not hurt that much anymore still has the pins in the second and third toe.  He states that the toe is a little more red-purple than it was denies any trauma other than his grandson hitting the toe briefly.  States that he saw stars when his grandson hit the toe as he ran up to him.  Otherwise he denies fever chills nausea vomiting muscle aches and pains.  Objective: Vital signs are stable he is alert and oriented x3-second toe does demonstrate a some mottling distally there is still some blanching and maceration along the edge of the toes most likely due to a bolus that had formed on the top of the foot.  None of this was present last time and looks much better last time than it does at this time I do think that the mildly should be okay I do think that he has been keeping it elevated and icing I do feel that we probably need to not elevate it is much and not ice it at all I do think we need some vasodilation and possibly even a Nitropatch.  Assessment well-healing surgical foot some mottling of the distal aspect of the second toe but due to its severe contracture I am really not surprised at this at all.  Plan: I do believe we should not elevated is much and I think that we should definitely do a Nitropatch should top of the foot to try to vasodilated still little bit I will see him back on Tuesday I did redress his foot today.  If he is not looking better on Tuesday then I will consider pulling the K wire.

## 2020-07-01 ENCOUNTER — Telehealth: Payer: Self-pay | Admitting: Podiatry

## 2020-07-01 MED ORDER — NITROGLYCERIN 0.2 MG/HR TD PT24: 0.2000 mg | MEDICATED_PATCH | Freq: Every day | TRANSDERMAL | 12 refills | Status: DC

## 2020-07-01 NOTE — Telephone Encounter (Signed)
Patient said the prescription that was called in cant be picked up until Monday, due to pharmacy not being able to get it until then. He wanted to know if you wanted to put him on something else or if you could send it to a different pharmacy. If you call it into another pharmacy, he is in [place] and [facility].

## 2020-07-01 NOTE — Telephone Encounter (Signed)
Called pt-  [facility]ing [facility] to see if they could get the prescription sooner than Monday. I went ahead and sent that over and to let me know if there was an issue.

## 2020-07-05 ENCOUNTER — Other Ambulatory Visit: Payer: Self-pay

## 2020-07-05 ENCOUNTER — Ambulatory Visit (INDEPENDENT_AMBULATORY_CARE_PROVIDER_SITE_OTHER): Payer: Federal, State, Local not specified - PPO | Admitting: Podiatry

## 2020-07-05 DIAGNOSIS — Z9889 Other specified postprocedural states: Secondary | ICD-10-CM

## 2020-07-05 DIAGNOSIS — M21961 Unspecified acquired deformity of right lower leg: Secondary | ICD-10-CM

## 2020-07-05 DIAGNOSIS — M2041 Other hammer toe(s) (acquired), right foot: Secondary | ICD-10-CM

## 2020-07-05 NOTE — Progress Notes (Signed)
He presents today for surgery he denies fever chills nausea vomiting muscle aches pains calf pain back pain chest pain shortness of breath postop visit #3.  Objective: Vital signs stable alert oriented x3 there is no erythema edema cellulitis drainage or odor endosteal dressing was removed demonstrates some pins to his toes #2 and 3 in the right foot sutures are intact second third and fifth toe areas. I see no signs of infection though he does have considerable discoloration of the second toe I do believe this is good to go on to heal very nicely I lanced the area today and allowed for bleeding in the old blood to be removed from those tissues I will follow-up with him over the next week or so just to make sure he is doing well.  Assessment: Well-healing surgical foot.  Plan: I redressed today dressed a compressive dressing and lanced the tissue so that it could be freed from its old blood. Also remove sutures from the fifth toe right foot.

## 2020-07-12 ENCOUNTER — Encounter: Payer: Federal, State, Local not specified - PPO | Admitting: Podiatry

## 2020-07-13 ENCOUNTER — Encounter: Payer: Self-pay | Admitting: Podiatry

## 2020-07-13 ENCOUNTER — Ambulatory Visit (INDEPENDENT_AMBULATORY_CARE_PROVIDER_SITE_OTHER): Payer: Federal, State, Local not specified - PPO | Admitting: Podiatry

## 2020-07-13 ENCOUNTER — Other Ambulatory Visit: Payer: Self-pay

## 2020-07-13 DIAGNOSIS — Z9889 Other specified postprocedural states: Secondary | ICD-10-CM

## 2020-07-13 DIAGNOSIS — M21961 Unspecified acquired deformity of right lower leg: Secondary | ICD-10-CM

## 2020-07-13 DIAGNOSIS — M2041 Other hammer toe(s) (acquired), right foot: Secondary | ICD-10-CM

## 2020-07-14 ENCOUNTER — Encounter: Payer: Self-pay | Admitting: Podiatry

## 2020-07-14 ENCOUNTER — Encounter: Payer: Federal, State, Local not specified - PPO | Admitting: Podiatry

## 2020-07-14 NOTE — Progress Notes (Signed)
Subjective:  Patient ID: Alex Boyle, male    DOB: 10-Sep-1945,  MRN: [id number]  Chief Complaint  Patient presents with  . Routine Post Op    POV #3 DOS 06/10/2020 RT FOOT 2ND METATARSAL OSTEOTOMY, HAMMERTOE REPAIR 2,3 & 5TH TOES/ Dressing change   "The toe is not as swollen, pain is controlled. I'm still using the patches"     75 y.o. male returns for post-op check. Patient is doing well. Seems like the discoloration is improving. They have been keeping the bandages nice and clean. He has been using his Nitro-Dur patches. The procedure was done by Dr. Milinda Pointer. Overall no acute complaints.  Review of Systems: Negative except as noted in the HPI. Denies N/V/F/Ch.  Past Medical History:  Diagnosis Date  . Allergy   . Arthritis   . Asthma   . Diverticulitis   . Hyperlipidemia   . Hypertension   . Myocardial infarction ([place])   . Sleep apnea   . Sleep apnea in adult     Current Outpatient Medications:  .  albuterol (PROVENTIL HFA;VENTOLIN HFA) 108 (90 Base) MCG/ACT inhaler, Inhale 1-2 puffs into the lungs every 6 (six) hours as needed for wheezing or shortness of breath., Disp: 1 Inhaler, Rfl: 12 .  aspirin EC 81 MG tablet, Take 1 tablet (81 mg total) by mouth daily., Disp: 90 tablet, Rfl: 3 .  BREO ELLIPTA 100-25 MCG/INH AEPB, INHALE ONE PUFF BY MOUTH ONE TIME DAILY, Disp: 60 each, Rfl: 12 .  finasteride (PROSCAR) 5 MG tablet, Take 1qd (Plz sched appt with new provider for future fills), Disp: 90 tablet, Rfl: 1 .  lisinopril (ZESTRIL) 5 MG tablet, TAKE ONE TABLET BY MOUTH ONE TIME DAILY *NEEDS APPOINTMENT FOR REFILLS*, Disp: 30 tablet, Rfl: 0 .  metoprolol tartrate (LOPRESSOR) 25 MG tablet, TAKE ONE TABLET BY MOUTH TWICE A DAY, Disp: 180 tablet, Rfl: 1 .  montelukast (SINGULAIR) 10 MG tablet, TAKE ONE TABLET BY MOUTH AT BEDTIME *NEEDS APPOINTMENT FOR REFILLS*, Disp: 30 tablet, Rfl: 0 .  nitroGLYCERIN (NITRO-DUR) 0.2 mg/hr patch, Place 1 patch (0.2 mg total) onto the skin  daily., Disp: 30 patch, Rfl: 12 .  omeprazole (PRILOSEC) 20 MG capsule, TAKE TWO CAPSULES BY MOUTH ONE TIME DAILY *NEEDS APPOINTMENT FOR REFILLS*, Disp: 60 capsule, Rfl: 0 .  rosuvastatin (CRESTOR) 20 MG tablet, *NEEDS APPOINTMENT FOR REFILLS*, Disp: 30 tablet, Rfl: 0 .  tamsulosin (FLOMAX) 0.4 MG CAPS capsule, Take 1qd (Plz sched appt with new provider for future fills), Disp: 90 capsule, Rfl: 0  Social History   Tobacco Use  Smoking Status Never Smoker  Smokeless Tobacco Never Used    Allergies  Allergen Reactions  . Codeine Shortness Of Breath  . Penicillins Swelling   Objective:  There were no vitals filed for this visit. There is no height or weight on file to calculate BMI. Constitutional Well developed. Well nourished.  Vascular Foot warm and well perfused. Capillary refill normal to all digits.   Neurologic Normal speech. Oriented to person, place, and time. Epicritic sensation to light touch grossly present bilaterally.  Dermatologic Skin healing well without signs of infection. Skin edges well coapted without signs of infection.  Orthopedic: Tenderness to palpation noted about the surgical site.   Radiographs: None Assessment:   1. Status post right foot surgery   2. Hammer toe of right foot   3. Deformity of metatarsal bone of right foot    Plan:  Patient was evaluated and treated and all questions answered.  S/p foot surgery right -Progressing as expected post-operatively. -XR: None -WB Status: WBAT in CAM boot -Sutures: Removed. N no dehiscence noted.  No complication noted.. -Medications: None -Foot redressed.  No follow-ups on file.

## 2020-07-19 ENCOUNTER — Other Ambulatory Visit: Payer: Self-pay

## 2020-07-19 ENCOUNTER — Ambulatory Visit (INDEPENDENT_AMBULATORY_CARE_PROVIDER_SITE_OTHER): Payer: Federal, State, Local not specified - PPO | Admitting: Podiatry

## 2020-07-19 ENCOUNTER — Encounter: Payer: Self-pay | Admitting: Podiatry

## 2020-07-19 ENCOUNTER — Ambulatory Visit (INDEPENDENT_AMBULATORY_CARE_PROVIDER_SITE_OTHER): Payer: Federal, State, Local not specified - PPO

## 2020-07-19 DIAGNOSIS — M2041 Other hammer toe(s) (acquired), right foot: Secondary | ICD-10-CM | POA: Diagnosis not present

## 2020-07-19 DIAGNOSIS — M21961 Unspecified acquired deformity of right lower leg: Secondary | ICD-10-CM

## 2020-07-19 DIAGNOSIS — Z9889 Other specified postprocedural states: Secondary | ICD-10-CM

## 2020-07-20 NOTE — Progress Notes (Signed)
He presents today date of surgery 06/10/2020 right foot bunion repair and second metatarsal osteotomy he states that he is doing just fine states that the toe starting to look better as he refers to the second injury of the right foot.  K wire remains in tact to the second and third toes.  Objective: Vital signs are stable he is alert and oriented x3 there is no erythema edema cellulitis drainage odor K wires are intact toes are rectus good range of motion of the first metatarsophalangeal joint.  K wires were removed today.  Second toe still demonstrates a lot of dead skin which will slough off over the next week or so.  Radiographs taken today demonstrate K wires are intact his second and third toes of the right foot and it appears that we have a complete arthrodesis.  No signs of infection.  Assessment: Well-healing surgical foot.  Plan: I removed his K wires today and I will follow-up with him in 2 weeks for another set of x-rays.

## 2020-07-26 ENCOUNTER — Encounter: Payer: Federal, State, Local not specified - PPO | Admitting: Podiatry

## 2020-08-02 ENCOUNTER — Encounter: Payer: Self-pay | Admitting: Podiatry

## 2020-08-02 ENCOUNTER — Ambulatory Visit (INDEPENDENT_AMBULATORY_CARE_PROVIDER_SITE_OTHER): Payer: Federal, State, Local not specified - PPO

## 2020-08-02 ENCOUNTER — Ambulatory Visit (INDEPENDENT_AMBULATORY_CARE_PROVIDER_SITE_OTHER): Payer: Federal, State, Local not specified - PPO | Admitting: Podiatry

## 2020-08-02 ENCOUNTER — Other Ambulatory Visit: Payer: Self-pay

## 2020-08-02 DIAGNOSIS — M21961 Unspecified acquired deformity of right lower leg: Secondary | ICD-10-CM

## 2020-08-02 DIAGNOSIS — Z9889 Other specified postprocedural states: Secondary | ICD-10-CM

## 2020-08-02 DIAGNOSIS — M2041 Other hammer toe(s) (acquired), right foot: Secondary | ICD-10-CM

## 2020-08-02 NOTE — Progress Notes (Signed)
He presents today for follow-up of his segment osteotomy hammertoe repair #2 #3 the right foot.  States that the toes are doing much better he is very happy with the outcome thus far.  Denies fever chills nausea vomiting muscle aches and pain states that they are dressing it on a regular basis that he continues to wear his Darco shoe.  Objective: Vital signs are stable he is alert oriented x3 reactive hyperkeratotic lesion plantar lateral aspect of his right foot was debrided appears to be healing very nicely.  There is a small hole present with no purulence or no malodor.  Most likely due from the surgery.  The toe of second right still demonstrates some considerable eschar of the skin which a large majority that was debrided today with underlying pink skin it is healthy and does not appear to be infected.  Only a small eschar remains dorsally.  Radiographs taken today demonstrate well healing osteotomy with internal fixation.  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  Assessment well-healing surgical foot  Plan: Discussed etiology pathology conservative therapies this gentleman is very happy with his outcome.  At this point I demonstrated to him and his wife how to wrap the third toe with Coban.  He will continue the use of his Darco shoe for another 2 weeks I will follow-up with him at which time we will go back into a regular tennis shoe.

## 2020-08-15 ENCOUNTER — Telehealth: Payer: Self-pay | Admitting: Nurse Practitioner

## 2020-08-15 DIAGNOSIS — L249 Irritant contact dermatitis, unspecified cause: Secondary | ICD-10-CM | POA: Diagnosis not present

## 2020-08-15 DIAGNOSIS — D225 Melanocytic nevi of trunk: Secondary | ICD-10-CM | POA: Diagnosis not present

## 2020-08-15 DIAGNOSIS — L918 Other hypertrophic disorders of the skin: Secondary | ICD-10-CM | POA: Diagnosis not present

## 2020-08-15 DIAGNOSIS — T148XXA Other injury of unspecified body region, initial encounter: Secondary | ICD-10-CM

## 2020-08-15 DIAGNOSIS — C44219 Basal cell carcinoma of skin of left ear and external auricular canal: Secondary | ICD-10-CM | POA: Diagnosis not present

## 2020-08-15 DIAGNOSIS — L821 Other seborrheic keratosis: Secondary | ICD-10-CM | POA: Diagnosis not present

## 2020-08-15 DIAGNOSIS — D485 Neoplasm of uncertain behavior of skin: Secondary | ICD-10-CM | POA: Diagnosis not present

## 2020-08-15 MED ORDER — TIZANIDINE HCL 4 MG PO TABS: 4.0000 mg | ORAL_TABLET | Freq: Three times a day (TID) | ORAL | 0 refills | Status: DC | PRN

## 2020-08-15 NOTE — Telephone Encounter (Signed)
Pt called and said he threw his back out again and wanted to know if Baldo Ash can send in the same muscle relaxer she sent in last time he was here, call back [phone]

## 2020-08-15 NOTE — Telephone Encounter (Signed)
LVM informing patient medication should be ready at his pharmacy.

## 2020-08-16 ENCOUNTER — Other Ambulatory Visit: Payer: Self-pay

## 2020-08-16 ENCOUNTER — Ambulatory Visit (INDEPENDENT_AMBULATORY_CARE_PROVIDER_SITE_OTHER): Payer: Federal, State, Local not specified - PPO | Admitting: Podiatry

## 2020-08-16 ENCOUNTER — Ambulatory Visit (INDEPENDENT_AMBULATORY_CARE_PROVIDER_SITE_OTHER): Payer: Federal, State, Local not specified - PPO

## 2020-08-16 ENCOUNTER — Other Ambulatory Visit: Payer: Self-pay | Admitting: Podiatry

## 2020-08-16 DIAGNOSIS — M2041 Other hammer toe(s) (acquired), right foot: Secondary | ICD-10-CM

## 2020-08-16 DIAGNOSIS — M2011 Hallux valgus (acquired), right foot: Secondary | ICD-10-CM

## 2020-08-16 DIAGNOSIS — Z9889 Other specified postprocedural states: Secondary | ICD-10-CM

## 2020-08-16 DIAGNOSIS — M21961 Unspecified acquired deformity of right lower leg: Secondary | ICD-10-CM

## 2020-08-17 NOTE — Progress Notes (Signed)
He presents today for follow-up of his right foot second metatarsal osteotomy hammertoe repair #2 #3 #5 all of the right foot.  States that all the scabs are coming off except for this morning as he points to the dorsal aspect of the right toe.  States that everything feels fine no problems.  Still is in his Darco shoe.  Objective: Vital signs are stable alert oriented x3 there is no erythema edema cellulitis drainage or odor incision site is gone to heal uneventfully.  Eschar to the dorsal aspect of the second toe is still intact does not appear to be infected.  Fifth digit has gone on to heal uneventfully as is the third.  Assessment: Well-healing surgical foot.  I will allow him to get back into his regular shoes and I will follow-up with him in 1 month he will continue to dress the toes as directed previously for compression and stabilization.  Remember to ask how his son's wedding was in [place].

## 2020-09-08 ENCOUNTER — Other Ambulatory Visit: Payer: Self-pay | Admitting: Family Medicine

## 2020-09-20 ENCOUNTER — Ambulatory Visit (INDEPENDENT_AMBULATORY_CARE_PROVIDER_SITE_OTHER): Payer: Federal, State, Local not specified - PPO | Admitting: Podiatry

## 2020-09-20 ENCOUNTER — Other Ambulatory Visit: Payer: Self-pay | Admitting: Podiatry

## 2020-09-20 ENCOUNTER — Other Ambulatory Visit: Payer: Self-pay

## 2020-09-20 ENCOUNTER — Ambulatory Visit (INDEPENDENT_AMBULATORY_CARE_PROVIDER_SITE_OTHER): Payer: Federal, State, Local not specified - PPO

## 2020-09-20 ENCOUNTER — Encounter: Payer: Self-pay | Admitting: Podiatry

## 2020-09-20 DIAGNOSIS — M2041 Other hammer toe(s) (acquired), right foot: Secondary | ICD-10-CM

## 2020-09-20 DIAGNOSIS — Z9889 Other specified postprocedural states: Secondary | ICD-10-CM

## 2020-09-20 DIAGNOSIS — M21961 Unspecified acquired deformity of right lower leg: Secondary | ICD-10-CM

## 2020-09-21 NOTE — Progress Notes (Signed)
He presents today for follow-up of his hammertoe repairs to 3 and 5 with a second metatarsal osteotomy he is doing very well at this point he says he was able to go to his son's wedding and wear regular shoes.  Currently he states that he and his wife are taking care of the wound on the top of the second toe where the scab peeled off.  They are placing Neosporin on it daily.  Objective: Vital signs are stable alert oriented x3.  Pulses are palpable.  He is healing very nicely he does have about a 1 cm long superficial wound with some fibrin deposition in the area and some serosanguineous drainage no granulation tissue that I see yet.  But I see no signs of infection and I do not see bone or deeper structures.  Radiographs taken today demonstrate well healing surgical toes and metatarsal osteotomy.  Assessment: Well-healing surgical foot chronic wound.  Plan: At this point were going to start applying Iodosorb on a daily basis with a small dressing he understands this is amenable to it I will allow him back into regular shoes as long as is not pressing on the wound.  I will follow-up with him in 2 to 3 weeks should this wound worsen become red began to drain worse or become painful or odorous he will notify us immediately.

## 2020-09-28 ENCOUNTER — Ambulatory Visit: Payer: Federal, State, Local not specified - PPO | Admitting: Dermatology

## 2020-10-08 ENCOUNTER — Other Ambulatory Visit: Payer: Self-pay | Admitting: Family Medicine

## 2020-10-10 DIAGNOSIS — K080 Exfoliation of teeth due to systemic causes: Secondary | ICD-10-CM | POA: Diagnosis not present

## 2020-10-11 ENCOUNTER — Other Ambulatory Visit: Payer: Self-pay

## 2020-10-11 ENCOUNTER — Encounter: Payer: Self-pay | Admitting: Podiatry

## 2020-10-11 ENCOUNTER — Ambulatory Visit (INDEPENDENT_AMBULATORY_CARE_PROVIDER_SITE_OTHER): Payer: Federal, State, Local not specified - PPO

## 2020-10-11 ENCOUNTER — Ambulatory Visit (INDEPENDENT_AMBULATORY_CARE_PROVIDER_SITE_OTHER): Payer: Federal, State, Local not specified - PPO | Admitting: Podiatry

## 2020-10-11 DIAGNOSIS — M2041 Other hammer toe(s) (acquired), right foot: Secondary | ICD-10-CM | POA: Diagnosis not present

## 2020-10-11 DIAGNOSIS — M21961 Unspecified acquired deformity of right lower leg: Secondary | ICD-10-CM

## 2020-10-11 DIAGNOSIS — S90121A Contusion of right lesser toe(s) without damage to nail, initial encounter: Secondary | ICD-10-CM | POA: Diagnosis not present

## 2020-10-11 DIAGNOSIS — L97511 Non-pressure chronic ulcer of other part of right foot limited to breakdown of skin: Secondary | ICD-10-CM

## 2020-10-11 DIAGNOSIS — Z9889 Other specified postprocedural states: Secondary | ICD-10-CM

## 2020-10-11 NOTE — Progress Notes (Signed)
He presents today date of surgery 1231 2021-second met osteotomy with hammertoe repair second and third.  States that he took a tumble yesterday falling on the stairs injuring his knee his armand he said he bumped his toe during the process.  States that the toe has not gone on to heal the wound as of yet but continues to apply the Iodosorb gel.  Objective: Vital signs stable he is alert and oriented x3.  Pulses are palpable.  There is no erythema edema cellulitis drainage or odor to the toe.  The wound to the dorsal aspect of the toe was an avascular eschar area from postoperative period.  The there is granulation tissue upon debridement today that is visible.  Radiographs taken today do not demonstrate any type of osseous abnormalities to the area.  Assessment well-healing surgical foot contusion toe and open wound status post surgical foot.  Plan: Discussed etiology pathology conservative therapies at this point in time I debrided the area today placed more Iodosorb gel and redressed.  He is going to continue to do so and I will follow-up with him in another month at which time if he is not considerably improved may need to consider giving him to Dr. Sherryle Lis for possible graft of that toe.

## 2020-10-14 DIAGNOSIS — K080 Exfoliation of teeth due to systemic causes: Secondary | ICD-10-CM | POA: Diagnosis not present

## 2020-11-03 ENCOUNTER — Other Ambulatory Visit: Payer: Self-pay

## 2020-11-03 ENCOUNTER — Encounter: Payer: Self-pay | Admitting: Podiatry

## 2020-11-03 ENCOUNTER — Ambulatory Visit (INDEPENDENT_AMBULATORY_CARE_PROVIDER_SITE_OTHER): Payer: Federal, State, Local not specified - PPO | Admitting: Podiatry

## 2020-11-03 DIAGNOSIS — L97511 Non-pressure chronic ulcer of other part of right foot limited to breakdown of skin: Secondary | ICD-10-CM

## 2020-11-06 NOTE — Progress Notes (Signed)
He presents today date of surgery 1231 2021-second metatarsal osteotomy hammertoe repair #2 #3 #5 on the right foot.  States that he is doing quite well he says I wish this wound would go ahead and heal up.  And he continues to dress it on a regular basis.  Objective: Vital signs are stable alert and oriented x3 last time I saw the wound he had a large eschar on it which no longer does there is some fibrin deposition on it currently and the margins appear to be getting smaller there is some granulation tissue that is present does not appear to be clinically infected at this time the toe is not red nor is it warm to the touch.  Assessment: Slowly healing wound second digit right foot status post hammertoe repair.  Plan: Discussed etiology pathology and surgical therapies this point time we will follow-up with him in 3 to 4 weeks if he has not improved by that time I will refer him to Dr. Sherryle Lis for a graft.

## 2020-11-07 ENCOUNTER — Telehealth: Payer: Self-pay | Admitting: Family Medicine

## 2020-11-08 NOTE — Telephone Encounter (Signed)
During his appt with me, he stated he was going to establish with Dr. Ethelene Hal.  Contact him and clarify if he plans to keep his care with me or with Dr. Ethelene Hal, then schedule accordingly. Thank you

## 2020-11-08 NOTE — Telephone Encounter (Signed)
According to his chart he did a Transfer of Care appointment with [facility] back in November so I'm confused who to schedule him with. Please advise.

## 2020-11-10 DIAGNOSIS — C44219 Basal cell carcinoma of skin of left ear and external auricular canal: Secondary | ICD-10-CM | POA: Diagnosis not present

## 2020-11-14 ENCOUNTER — Other Ambulatory Visit: Payer: Self-pay

## 2020-11-14 ENCOUNTER — Encounter: Payer: Self-pay | Admitting: Family Medicine

## 2020-11-14 ENCOUNTER — Ambulatory Visit (INDEPENDENT_AMBULATORY_CARE_PROVIDER_SITE_OTHER): Payer: Federal, State, Local not specified - PPO | Admitting: Family Medicine

## 2020-11-14 ENCOUNTER — Other Ambulatory Visit: Payer: Federal, State, Local not specified - PPO

## 2020-11-14 VITALS — BP 120/68 | HR 92 | Temp 98.3°F | Ht 71.0 in | Wt 227.0 lb

## 2020-11-14 DIAGNOSIS — I10 Essential (primary) hypertension: Secondary | ICD-10-CM | POA: Diagnosis not present

## 2020-11-14 DIAGNOSIS — K22719 Barrett's esophagus with dysplasia, unspecified: Secondary | ICD-10-CM | POA: Diagnosis not present

## 2020-11-14 DIAGNOSIS — Z85828 Personal history of other malignant neoplasm of skin: Secondary | ICD-10-CM

## 2020-11-14 DIAGNOSIS — E785 Hyperlipidemia, unspecified: Secondary | ICD-10-CM | POA: Diagnosis not present

## 2020-11-14 DIAGNOSIS — G629 Polyneuropathy, unspecified: Secondary | ICD-10-CM | POA: Diagnosis not present

## 2020-11-14 DIAGNOSIS — L97511 Non-pressure chronic ulcer of other part of right foot limited to breakdown of skin: Secondary | ICD-10-CM

## 2020-11-14 DIAGNOSIS — Z0000 Encounter for general adult medical examination without abnormal findings: Secondary | ICD-10-CM

## 2020-11-14 DIAGNOSIS — Z860100 Personal history of colon polyps, unspecified: Secondary | ICD-10-CM

## 2020-11-14 DIAGNOSIS — N401 Enlarged prostate with lower urinary tract symptoms: Secondary | ICD-10-CM

## 2020-11-14 MED ORDER — LISINOPRIL 5 MG PO TABS
ORAL_TABLET | ORAL | 1 refills | Status: DC
Start: 2020-11-14 — End: 2021-01-02

## 2020-11-14 NOTE — Progress Notes (Signed)
Established Patient Office Visit  Subjective:  Patient ID: Alex Boyle, male    DOB: 06-09-1946  Age: 75 y.o. MRN: [id number]  CC:  Chief Complaint  Patient presents with  . Establish Care    TOC- establish care.  C/o having some numbness/tingling in both feet, wants referral to GI for colonoscopy/endoscopy.       HPI Alex Boyle presents for establishment of care by way of transfer.  There is a significant diagnosis of colectomy status post diverticular disease, colon polyposis and Barrett's esophagus with longstanding reflux disease.  History of BPH.  Currently seeing urology for yearly follow-up.  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  Status post recent Mohs surgery posterior left ear.  Has a nonhealing wound of his right second toe after foot surgery in December.  Follow-up is scheduled with his podiatrist in 2 weeks.  He believes that he may need a skin graft.  Reports paresthesias on the tops of both feet and toes over the last several months.  Chart review shows a low normal B12.  Distant measurements of TSH and hemoglobin A1c were normal.  History of elevated ldl cholesterol, hypertension and coronary artery disease.  Past Medical History:  Diagnosis Date  . Allergy   . Arthritis   . Asthma   . Diverticulitis   . Hyperlipidemia   . Hypertension   . Myocardial infarction ([place])   . Sleep apnea   . Sleep apnea in adult     Past Surgical History:  Procedure Laterality Date  . COLON SURGERY    . CORONARY STENT PLACEMENT     x2  . FOOT SURGERY    . JOINT REPLACEMENT     knee  . LAPAROSCOPIC TRANSABDOMINAL HERNIA    . MANDIBLE SURGERY    . skin cancer     basil cell removed on back    Family History  Adopted: Yes  Problem Relation Age of Onset  . Heart disease Mother   . Hypertension Mother   . Arthritis Mother   . Alzheimer's disease Mother     Social History   Socioeconomic History  . Marital status: Married    Spouse  name: Not on file  . Number of children: Not on file  . Years of education: Not on file  . Highest education level: Not on file  Occupational History  . Not on file  Tobacco Use  . Smoking status: Never Smoker  . Smokeless tobacco: Never Used  Vaping Use  . Vaping Use: Former  Substance and Sexual Activity  . Alcohol use: Yes    Comment: soically   . Drug use: Never  . Sexual activity: Yes    Birth control/protection: None  Other Topics Concern  . Not on file  Social History Narrative  . Not on file   Social Determinants of Health   Financial Resource Strain: Not on file  Food Insecurity: Not on file  Transportation Needs: Not on file  Physical Activity: Not on file  Stress: Not on file  Social Connections: Not on file  Intimate Partner Violence: Not on file    Outpatient Medications Prior to Visit  Medication Sig Dispense Refill  . albuterol (PROVENTIL HFA;VENTOLIN HFA) 108 (90 Base) MCG/ACT inhaler Inhale 1-2 puffs into the lungs every 6 (six) hours as needed for wheezing or shortness of breath. 1 Inhaler 12  . BREO ELLIPTA 100-25 MCG/INH AEPB INHALE ONE PUFF BY MOUTH ONE TIME DAILY 60 each  12  . finasteride (PROSCAR) 5 MG tablet Take 1qd (Plz sched appt with new provider for future fills) 90 tablet 1  . metoprolol tartrate (LOPRESSOR) 25 MG tablet TAKE ONE TABLET BY MOUTH TWICE A DAY 180 tablet 1  . montelukast (SINGULAIR) 10 MG tablet TAKE ONE TABLET BY MOUTH AT BEDTIME *NEEDS APPOINTMENT FOR REFILLS* 30 tablet 0  . omeprazole (PRILOSEC) 20 MG capsule TAKE TWO CAPSULES BY MOUTH ONE TIME DAILY *NEEDS APPOINTMENT FOR REFILLS* 60 capsule 0  . rosuvastatin (CRESTOR) 20 MG tablet TAKE ONE TABLET BY MOUTH ONE TIME DAILY *NEEDS NEW PROVIDER FOR REFILLS* 30 tablet 0  . tamsulosin (FLOMAX) 0.4 MG CAPS capsule Take 1qd (Plz sched appt with new provider for future fills) 90 capsule 0  . lisinopril (ZESTRIL) 5 MG tablet TAKE ONE TABLET BY MOUTH ONE TIME DAILY *NEEDS APPOINTMENT FOR  REFILLS* 30 tablet 0  . aspirin EC 81 MG tablet Take 1 tablet (81 mg total) by mouth daily. (Patient not taking: Reported on 11/14/2020) 90 tablet 3  . betamethasone dipropionate 0.05 % cream SMARTSIG:Sparingly Topical Daily    . nitroGLYCERIN (NITRO-DUR) 0.2 mg/hr patch Place 1 patch (0.2 mg total) onto the skin daily. (Patient not taking: Reported on 11/14/2020) 30 patch 12  . tiZANidine (ZANAFLEX) 4 MG tablet Take 1 tablet (4 mg total) by mouth every 8 (eight) hours as needed for muscle spasms. (Patient not taking: Reported on 11/14/2020) 15 tablet 0   No facility-administered medications prior to visit.    Allergies  Allergen Reactions  . Codeine Shortness Of Breath  . Gramineae Pollens Itching  . Penicillins Swelling    ROS Review of Systems  Constitutional: Negative.   HENT: Negative.   Eyes: Negative for photophobia and visual disturbance.  Respiratory: Negative.  Negative for chest tightness.   Cardiovascular: Negative for chest pain.  Gastrointestinal: Negative.   Endocrine: Negative for polyphagia and polyuria.  Genitourinary: Negative for difficulty urinating, frequency and urgency.  Musculoskeletal: Negative for gait problem.  Skin: Positive for wound.  Neurological: Positive for numbness. Negative for speech difficulty and weakness.  Psychiatric/Behavioral: Negative.       Objective:    Physical Exam Vitals and nursing note reviewed.  Constitutional:      General: He is not in acute distress.    Appearance: Normal appearance. He is not ill-appearing, toxic-appearing or diaphoretic.  HENT:     Head: Normocephalic and atraumatic.     Right Ear: Tympanic membrane, ear canal and external ear normal.     Left Ear: Tympanic membrane, ear canal and external ear normal.     Mouth/Throat:     Mouth: Mucous membranes are moist.     Pharynx: Oropharynx is clear. No oropharyngeal exudate or posterior oropharyngeal erythema.  Eyes:     Extraocular Movements: Extraocular  movements intact.     Conjunctiva/sclera: Conjunctivae normal.     Pupils: Pupils are equal, round, and reactive to light.  Neck:     Vascular: No carotid bruit.  Cardiovascular:     Rate and Rhythm: Normal rate and regular rhythm.     Pulses:          Dorsalis pedis pulses are 1+ on the right side and 1+ on the left side.       Posterior tibial pulses are 1+ on the right side and 1+ on the left side.  Pulmonary:     Effort: Pulmonary effort is normal. No respiratory distress.     Breath sounds: Normal breath  sounds. No wheezing or rhonchi.  Abdominal:     General: Abdomen is flat. Bowel sounds are normal. There is no distension.     Palpations: Abdomen is soft. There is no mass.     Tenderness: There is no abdominal tenderness. There is no guarding or rebound.     Hernia: No hernia is present.  Musculoskeletal:     Cervical back: No rigidity or tenderness.  Lymphadenopathy:     Cervical: No cervical adenopathy.  Skin:    General: Skin is warm and dry.       Neurological:     Mental Status: He is alert and oriented to person, place, and time.     BP 120/68   Pulse 92   Temp 98.3 F (36.8 C) (Temporal)   Ht 5\' 11"  (1.803 m)   Wt 227 lb (103 kg)   SpO2 93%   BMI 31.66 kg/m  Wt Readings from Last 3 Encounters:  11/14/20 227 lb (103 kg)  05/19/20 225 lb 1.3 oz (102.1 kg)  04/28/20 226 lb 6.4 oz (102.7 kg)     Health Maintenance Due  Topic Date Due  . Pneumococcal Vaccine 5-62 Years old (1 of 4 - PCV13) Never done  . Hepatitis C Screening  Never done  . TETANUS/TDAP  Never done  . COLONOSCOPY (Pts 45-62yrs Insurance coverage will need to be confirmed)  Never done  . Zoster Vaccines- Shingrix (1 of 2) Never done  . PNA vac Low Risk Adult (1 of 2 - PCV13) Never done  . COVID-19 Vaccine (4 - Booster for Pfizer series) 07/15/2020    There are no preventive care reminders to display for this patient.  Lab Results  Component Value Date   TSH 2.30 10/31/2018   Lab  Results  Component Value Date   WBC 6.9 10/31/2018   HGB 16.4 10/31/2018   HCT 48.0 10/31/2018   MCV 88.2 10/31/2018   PLT 193 10/31/2018   Lab Results  Component Value Date   NA 139 04/28/2020   K 4.4 04/28/2020   CO2 33 (H) 04/28/2020   GLUCOSE 86 04/28/2020   BUN 11 04/28/2020   CREATININE 0.89 04/28/2020   BILITOT 0.5 04/28/2020   ALKPHOS 49 04/28/2020   AST 26 04/28/2020   ALT 31 04/28/2020   PROT 7.6 04/28/2020   ALBUMIN 4.5 04/28/2020   CALCIUM 10.1 04/28/2020   GFR 84.97 04/28/2020   Lab Results  Component Value Date   CHOL 162 04/28/2020   Lab Results  Component Value Date   HDL 38.60 (L) 04/28/2020   Lab Results  Component Value Date   LDLCALC 79 10/31/2018   Lab Results  Component Value Date   TRIG 268.0 (H) 04/28/2020   Lab Results  Component Value Date   CHOLHDL 4 04/28/2020   Lab Results  Component Value Date   HGBA1C 5.8 01/06/2019      Assessment & Plan:   Problem List Items Addressed This Visit      Cardiovascular and Mediastinum   Essential hypertension   Relevant Medications   lisinopril (ZESTRIL) 5 MG tablet   Other Relevant Orders   CBC   Comprehensive metabolic panel   Urinalysis, Routine w reflex microscopic     Digestive   Barrett's esophagus with dysplasia   Relevant Orders   Ambulatory referral to Gastroenterology     Nervous and Auditory   Neuropathy   Relevant Orders   Hemoglobin A1c   TSH   Vitamin B12  Musculoskeletal and Integument   History of basal cell cancer   Ulcer of right foot, limited to breakdown of skin ([place]) - Primary   Relevant Orders   VAS [place] ABI WITH/WO TBI     Genitourinary   Benign prostatic hyperplasia with lower urinary tract symptoms     Other   Hyperlipidemia LDL goal <70   Relevant Medications   lisinopril (ZESTRIL) 5 MG tablet   Other Relevant Orders   Lipid panel   Healthcare maintenance   History of colon polyps   Relevant Orders   Ambulatory referral to  Gastroenterology      Meds ordered this encounter  Medications  . lisinopril (ZESTRIL) 5 MG tablet    Sig: Take one daily    Dispense:  90 tablet    Refill:  1    Follow-up: Return in about 3 months (around 02/14/2021), or Return fasting for blood work tomorrow.Marland Kitchen  Spent 40 minutes with this patient taking his medical history reviewing the medical record and executing a plan for his multiple health issues.  Libby Maw, MD

## 2020-11-15 ENCOUNTER — Other Ambulatory Visit (INDEPENDENT_AMBULATORY_CARE_PROVIDER_SITE_OTHER): Payer: Federal, State, Local not specified - PPO

## 2020-11-15 DIAGNOSIS — G629 Polyneuropathy, unspecified: Secondary | ICD-10-CM

## 2020-11-15 DIAGNOSIS — E785 Hyperlipidemia, unspecified: Secondary | ICD-10-CM

## 2020-11-15 DIAGNOSIS — I10 Essential (primary) hypertension: Secondary | ICD-10-CM

## 2020-11-15 LAB — LIPID PANEL
Cholesterol: 185 mg/dL (ref 0–200)
HDL: 39.9 mg/dL (ref >39.00)
NonHDL: 144.66
Total CHOL/HDL Ratio: 5
Triglycerides: 221 mg/dL — ABNORMAL HIGH (ref 0.0–149.0)
VLDL: 44.2 mg/dL — ABNORMAL HIGH (ref 0.0–40.0)

## 2020-11-15 LAB — URINALYSIS, ROUTINE W REFLEX MICROSCOPIC
Bilirubin Urine: NEGATIVE
Hgb urine dipstick: NEGATIVE
Ketones, ur: NEGATIVE
Leukocytes,Ua: NEGATIVE
Nitrite: NEGATIVE
RBC / HPF: NONE SEEN (ref 0–?)
Specific Gravity, Urine: 1.015 (ref 1.000–1.030)
Total Protein, Urine: NEGATIVE
Urine Glucose: NEGATIVE
Urobilinogen, UA: 0.2 (ref 0.0–1.0)
pH: 6.5 (ref 5.0–8.0)

## 2020-11-15 LAB — VITAMIN B12: Vitamin B-12: 591 pg/mL (ref 211–911)

## 2020-11-15 LAB — TSH: TSH: 2.34 u[IU]/mL (ref 0.35–4.50)

## 2020-11-15 LAB — COMPREHENSIVE METABOLIC PANEL
ALT: 39 U/L (ref 0–53)
AST: 25 U/L (ref 0–37)
Albumin: 4.5 g/dL (ref 3.5–5.2)
Alkaline Phosphatase: 57 U/L (ref 39–117)
BUN: 17 mg/dL (ref 6–23)
CO2: 29 mEq/L (ref 19–32)
Calcium: 9.9 mg/dL (ref 8.4–10.5)
Chloride: 99 mEq/L (ref 96–112)
Creatinine, Ser: 0.79 mg/dL (ref 0.40–1.50)
GFR: 87.75 mL/min (ref >60.00)
Glucose, Bld: 103 mg/dL — ABNORMAL HIGH (ref 70–99)
Potassium: 4.8 mEq/L (ref 3.5–5.1)
Sodium: 139 mEq/L (ref 135–145)
Total Bilirubin: 0.8 mg/dL (ref 0.2–1.2)
Total Protein: 7.3 g/dL (ref 6.0–8.3)

## 2020-11-15 LAB — CBC
HCT: 49.9 % (ref 39.0–52.0)
Hemoglobin: 17.1 g/dL — ABNORMAL HIGH (ref 13.0–17.0)
MCHC: 34.3 g/dL (ref 30.0–36.0)
MCV: 90 fl (ref 78.0–100.0)
Platelets: 183 10*3/uL (ref 150.0–400.0)
RBC: 5.55 Mil/uL (ref 4.22–5.81)
RDW: 14.3 % (ref 11.5–15.5)
WBC: 7 10*3/uL (ref 4.0–10.5)

## 2020-11-15 LAB — LDL CHOLESTEROL, DIRECT: Direct LDL: 102 mg/dL

## 2020-11-15 LAB — HEMOGLOBIN A1C: Hgb A1c MFr Bld: 5.9 % (ref 4.6–6.5)

## 2020-11-15 NOTE — Progress Notes (Signed)
Per orders of Dr. Ethelene Hal pt is here for labs, pt tolerated draw well.

## 2020-11-23 ENCOUNTER — Ambulatory Visit (HOSPITAL_COMMUNITY)
Admission: RE | Admit: 2020-11-23 | Discharge: 2020-11-23 | Disposition: A | Discharge status: Home / Self Care | Payer: Federal, State, Local not specified - PPO | Source: Ambulatory Visit | Attending: Family Medicine | Admitting: Family Medicine

## 2020-11-23 ENCOUNTER — Other Ambulatory Visit: Payer: Self-pay

## 2020-11-23 DIAGNOSIS — L97511 Non-pressure chronic ulcer of other part of right foot limited to breakdown of skin: Secondary | ICD-10-CM | POA: Insufficient documentation

## 2020-11-23 NOTE — Progress Notes (Signed)
ABI completed. Refer to "CV Proc" under chart review to view preliminary results.  11/23/2020 10:36 AM Kelby Aline., MHA, RVT, RDCS, RDMS

## 2020-11-27 NOTE — Progress Notes (Signed)
ABIs were normal. No evidence for vascular disease.

## 2020-11-29 ENCOUNTER — Ambulatory Visit: Payer: Federal, State, Local not specified - PPO | Admitting: Family Medicine

## 2020-12-01 ENCOUNTER — Ambulatory Visit (INDEPENDENT_AMBULATORY_CARE_PROVIDER_SITE_OTHER): Payer: Federal, State, Local not specified - PPO | Admitting: Family Medicine

## 2020-12-01 ENCOUNTER — Ambulatory Visit: Payer: Federal, State, Local not specified - PPO | Admitting: Family Medicine

## 2020-12-01 DIAGNOSIS — E7800 Pure hypercholesterolemia, unspecified: Secondary | ICD-10-CM | POA: Diagnosis not present

## 2020-12-01 DIAGNOSIS — I2510 Atherosclerotic heart disease of native coronary artery without angina pectoris: Secondary | ICD-10-CM | POA: Diagnosis not present

## 2020-12-01 MED ORDER — ROSUVASTATIN CALCIUM 40 MG PO TABS: 40.0000 mg | ORAL_TABLET | Freq: Every day | ORAL | 1 refills | Status: DC

## 2020-12-01 NOTE — Progress Notes (Signed)
Established Patient Office Visit  Subjective:  Patient ID: Alex Boyle, male    DOB: 11/24/1945  Age: 75 y.o. MRN: [id number]  CC:  Chief Complaint  Patient presents with   Advice Only    Discuss labs     HPI Alex Boyle presents for follow-up of labs drawn at his last appointment.  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  He has been taking his Crestor daily.  Overall his lipid profile is not too bad, but with his history of coronary artery disease the LDL needs to be lower.  Past Medical History:  Diagnosis Date   Allergy    Arthritis    Asthma    Diverticulitis    Hyperlipidemia    Hypertension    Myocardial infarction (HCC)    Sleep apnea    Sleep apnea in adult     Past Surgical History:  Procedure Laterality Date   COLON SURGERY     CORONARY STENT PLACEMENT     x2   FOOT SURGERY     JOINT REPLACEMENT     knee   LAPAROSCOPIC TRANSABDOMINAL HERNIA     MANDIBLE SURGERY     skin cancer     basil cell removed on back    Family History  Adopted: Yes  Problem Relation Age of Onset   Heart disease Mother    Hypertension Mother    Arthritis Mother    Alzheimer's disease Mother     Social History   Socioeconomic History   Marital status: Married    Spouse name: Not on file   Number of children: Not on file   Years of education: Not on file   Highest education level: Not on file  Occupational History   Not on file  Tobacco Use   Smoking status: Never   Smokeless tobacco: Never  Vaping Use   Vaping Use: Former  Substance and Sexual Activity   Alcohol use: Yes    Comment: soically    Drug use: Never   Sexual activity: Yes    Birth control/protection: None  Other Topics Concern   Not on file  Social History Narrative   Not on file   Social Determinants of Health   Financial Resource Strain: Not on file  Food Insecurity: Not on file  Transportation Needs: Not on file  Physical Activity: Not  on file  Stress: Not on file  Social Connections: Not on file  Intimate Partner Violence: Not on file    Outpatient Medications Prior to Visit  Medication Sig Dispense Refill   albuterol (PROVENTIL HFA;VENTOLIN HFA) 108 (90 Base) MCG/ACT inhaler Inhale 1-2 puffs into the lungs every 6 (six) hours as needed for wheezing or shortness of breath. 1 Inhaler 12   betamethasone dipropionate 0.05 % cream SMARTSIG:Sparingly Topical Daily     BREO ELLIPTA 100-25 MCG/INH AEPB INHALE ONE PUFF BY MOUTH ONE TIME DAILY 60 each 12   finasteride (PROSCAR) 5 MG tablet Take 1qd (Plz sched appt with new provider for future fills) 90 tablet 1   lisinopril (ZESTRIL) 5 MG tablet Take one daily 90 tablet 1   metoprolol tartrate (LOPRESSOR) 25 MG tablet TAKE ONE TABLET BY MOUTH TWICE A DAY 180 tablet 1   montelukast (SINGULAIR) 10 MG tablet TAKE ONE TABLET BY MOUTH AT BEDTIME *NEEDS APPOINTMENT FOR REFILLS* 30 tablet 0   omeprazole (PRILOSEC) 20 MG capsule TAKE TWO CAPSULES BY MOUTH ONE TIME DAILY *NEEDS APPOINTMENT FOR  REFILLS* 60 capsule 0   tamsulosin (FLOMAX) 0.4 MG CAPS capsule Take 1qd (Plz sched appt with new provider for future fills) 90 capsule 0   rosuvastatin (CRESTOR) 20 MG tablet TAKE ONE TABLET BY MOUTH ONE TIME DAILY *NEEDS NEW PROVIDER FOR REFILLS* 30 tablet 0   aspirin EC 81 MG tablet Take 1 tablet (81 mg total) by mouth daily. (Patient not taking: Reported on 11/14/2020) 90 tablet 3   nitroGLYCERIN (NITRO-DUR) 0.2 mg/hr patch Place 1 patch (0.2 mg total) onto the skin daily. (Patient not taking: No sig reported) 30 patch 12   tiZANidine (ZANAFLEX) 4 MG tablet Take 1 tablet (4 mg total) by mouth every 8 (eight) hours as needed for muscle spasms. (Patient not taking: Reported on 11/14/2020) 15 tablet 0   No facility-administered medications prior to visit.    Allergies  Allergen Reactions   Codeine Shortness Of Breath   Gramineae Pollens Itching   Penicillins Swelling    ROS Review of Systems   Respiratory: Negative.    Cardiovascular: Negative.   Gastrointestinal: Negative.      Objective:    Physical Exam Constitutional:      Appearance: Normal appearance.  Eyes:     General: No scleral icterus.       Right eye: No discharge.        Left eye: No discharge.     Conjunctiva/sclera: Conjunctivae normal.  Pulmonary:     Effort: Pulmonary effort is normal.  Neurological:     Mental Status: He is alert and oriented to person, place, and time.  Psychiatric:        Mood and Affect: Mood normal.        Behavior: Behavior normal.    There were no vitals taken for this visit. Wt Readings from Last 3 Encounters:  11/14/20 227 lb (103 kg)  05/19/20 225 lb 1.3 oz (102.1 kg)  04/28/20 226 lb 6.4 oz (102.7 kg)     Health Maintenance Due  Topic Date Due   Hepatitis C Screening  Never done   TETANUS/TDAP  Never done   Zoster Vaccines- Shingrix (1 of 2) Never done   PNA vac Low Risk Adult (1 of 2 - PCV13) Never done   COVID-19 Vaccine (4 - Booster for Pfizer series) 07/15/2020    There are no preventive care reminders to display for this patient.  Lab Results  Component Value Date   TSH 2.34 11/15/2020   Lab Results  Component Value Date   WBC 7.0 11/15/2020   HGB 17.1 (H) 11/15/2020   HCT 49.9 11/15/2020   MCV 90.0 11/15/2020   PLT 183.0 11/15/2020   Lab Results  Component Value Date   NA 139 11/15/2020   K 4.8 11/15/2020   CO2 29 11/15/2020   GLUCOSE 103 (H) 11/15/2020   BUN 17 11/15/2020   CREATININE 0.79 11/15/2020   BILITOT 0.8 11/15/2020   ALKPHOS 57 11/15/2020   AST 25 11/15/2020   ALT 39 11/15/2020   PROT 7.3 11/15/2020   ALBUMIN 4.5 11/15/2020   CALCIUM 9.9 11/15/2020   GFR 87.75 11/15/2020   Lab Results  Component Value Date   CHOL 185 11/15/2020   Lab Results  Component Value Date   HDL 39.90 11/15/2020   Lab Results  Component Value Date   LDLCALC 79 10/31/2018   Lab Results  Component Value Date   TRIG 221.0 (H) 11/15/2020    Lab Results  Component Value Date   CHOLHDL 5 11/15/2020  Lab Results  Component Value Date   HGBA1C 5.9 11/15/2020      Assessment & Plan:   Problem List Items Addressed This Visit       Cardiovascular and Mediastinum   CAD (coronary artery disease) - Primary   Relevant Medications   rosuvastatin (CRESTOR) 40 MG tablet   Other Visit Diagnoses     Elevated LDL cholesterol level       Relevant Medications   rosuvastatin (CRESTOR) 40 MG tablet       Meds ordered this encounter  Medications   rosuvastatin (CRESTOR) 40 MG tablet    Sig: Take 1 tablet (40 mg total) by mouth daily.    Dispense:  90 tablet    Refill:  1    Follow-up: Return Follow up as directed in September..  Have increased Crestor to 40 mg to help lower LDL cholesterol even further.  After he has taken this a few weeks he will start 2 g of fish oil.  He will let me know if he develops any new muscle aches or pains on the higher dose of Crestor.  Libby Maw, MD  Virtual Visit via Video Note  I connected with Alex Boyle on 12/01/20 at 11:30 AM EDT by a video enabled telemedicine application and verified that I am speaking with the correct person using two identifiers.  Location: Patient: home alone Provider: work   I discussed the limitations of evaluation and management by telemedicine and the availability of in person appointments. The patient expressed understanding and agreed to proceed.  History of Present Illness:    Observations/Objective:   Assessment and Plan:   Follow Up Instructions:    I discussed the assessment and treatment plan with the patient. The patient was provided an opportunity to ask questions and all were answered. The patient agreed with the plan and demonstrated an understanding of the instructions.   The patient was advised to call back or seek an in-person evaluation if the symptoms worsen or if the condition fails to improve as  anticipated.  I provided 20 minutes of non-face-to-face time during this encounter.   Libby Maw, MD 20

## 2020-12-02 ENCOUNTER — Encounter: Payer: Self-pay | Admitting: Family Medicine

## 2020-12-06 DIAGNOSIS — L249 Irritant contact dermatitis, unspecified cause: Secondary | ICD-10-CM | POA: Diagnosis not present

## 2020-12-08 ENCOUNTER — Ambulatory Visit (INDEPENDENT_AMBULATORY_CARE_PROVIDER_SITE_OTHER): Payer: Federal, State, Local not specified - PPO | Admitting: Podiatry

## 2020-12-08 ENCOUNTER — Other Ambulatory Visit: Payer: Self-pay

## 2020-12-08 DIAGNOSIS — L97511 Non-pressure chronic ulcer of other part of right foot limited to breakdown of skin: Secondary | ICD-10-CM

## 2020-12-08 DIAGNOSIS — I2510 Atherosclerotic heart disease of native coronary artery without angina pectoris: Secondary | ICD-10-CM

## 2020-12-08 MED ORDER — DOXYCYCLINE HYCLATE 100 MG PO TABS: 100.0000 mg | ORAL_TABLET | Freq: Two times a day (BID) | ORAL | 0 refills | Status: DC

## 2020-12-08 NOTE — Progress Notes (Signed)
He presents today for follow-up of his ulceration to the second digit of the right foot status post hammertoe repair right.  States that he knocked the scab off of it today and it was bleeding a little bit before he left the house.  Continues to dress the toe on a daily basis denies fever chills nausea vomiting muscle aches and pains.  Objective: Evaluation of the wound dorsal aspect overlying the PIPJ area appears to demonstrate a well-healing ulceration and that it is getting smaller however he does have some fibrin deposition and some necrotic fat which I debrided today exposing a very thin layer of tissue just above the bone.  There is no purulence no malodor I will see any signs of significant infection.  Assessment: Well-healing surgical toe that the ulceration is delayed in its healing.  Plan: At this point I will only have him alternate Iodosorb and Bactroban ointment on a daily dressing change.  I would like for him to follow-up with Dr. Sherryle Lis while on on holiday and to make sure that he is doing well I would also like Dr. Sherryle Lis to consider grafting this to see if we can get any closure.  Even possibly get developed some granulation tissue for skin derivative to be placed.  I also started him on doxycycline just to help cover any infection that may occur.

## 2020-12-22 ENCOUNTER — Other Ambulatory Visit: Payer: Self-pay

## 2020-12-22 ENCOUNTER — Ambulatory Visit (INDEPENDENT_AMBULATORY_CARE_PROVIDER_SITE_OTHER): Payer: Federal, State, Local not specified - PPO | Admitting: Podiatry

## 2020-12-22 DIAGNOSIS — L97511 Non-pressure chronic ulcer of other part of right foot limited to breakdown of skin: Secondary | ICD-10-CM | POA: Diagnosis not present

## 2020-12-22 LAB — CBC WITH DIFFERENTIAL/PLATELET
Absolute Monocytes: 710 cells/uL (ref 200–950)
Basophils Absolute: 43 cells/uL (ref 0–200)
Basophils Relative: 0.6 %
Eosinophils Absolute: 121 cells/uL (ref 15–500)
Eosinophils Relative: 1.7 %
HCT: 47.5 % (ref 38.5–50.0)
Hemoglobin: 16.3 g/dL (ref 13.2–17.1)
Lymphs Abs: 2116 cells/uL (ref 850–3900)
MCH: 31.3 pg (ref 27.0–33.0)
MCHC: 34.3 g/dL (ref 32.0–36.0)
MCV: 91.3 fL (ref 80.0–100.0)
MPV: 10.1 fL (ref 7.5–12.5)
Monocytes Relative: 10 %
Neutro Abs: 4111 cells/uL (ref 1500–7800)
Neutrophils Relative %: 57.9 %
Platelets: 182 10*3/uL (ref 140–400)
RBC: 5.2 10*6/uL (ref 4.20–5.80)
RDW: 13.3 % (ref 11.0–15.0)
Total Lymphocyte: 29.8 %
WBC: 7.1 10*3/uL (ref 3.8–10.8)

## 2020-12-22 LAB — SEDIMENTATION RATE: Sed Rate: 6 mm/h (ref 0–20)

## 2020-12-25 LAB — WOUND CULTURE
MICRO NUMBER:: 12119660
SPECIMEN QUALITY:: ADEQUATE

## 2020-12-26 ENCOUNTER — Telehealth: Payer: Self-pay | Admitting: *Deleted

## 2020-12-26 MED ORDER — SULFAMETHOXAZOLE-TRIMETHOPRIM 800-160 MG PO TABS: 1.0000 | ORAL_TABLET | Freq: Two times a day (BID) | ORAL | 0 refills | Status: DC

## 2020-12-26 NOTE — Telephone Encounter (Signed)
Returned call, left VM, sent Bactrim, Rx and sent [place]

## 2020-12-26 NOTE — Telephone Encounter (Signed)
Patient is calling and wants to discuss with the physician his foot infection, lab results of the culture and how soon to start his medication to get rid of this.Please advise

## 2020-12-26 NOTE — Progress Notes (Signed)
  Subjective:  Patient ID: Deborah Dondero, male    DOB: April 15, 1946,  MRN: [id number]  Chief Complaint  Patient presents with   Routine Post Op       2 week fu  toe ulcer POV #10 DOS 06/10/2020 RT FOOT 2ND METATARSAL OSTEOTOMY, HAMMERTOE REPAIR 2,3 & 5TH TOES    75 y.o. male presents with the above complaint. History confirmed with patient.  This particular toe is just been very difficult to heal and has had a persistent scab and area of drainage  Objective:  Physical Exam: warm, good capillary refill, no trophic changes or ulcerative lesions, normal DP and PT pulses, and normal sensory exam.  Right second toe small ulcer with overlying dried blood and fibrotic scab, debridement reveals deep probing sinus tract with small area of drainage Assessment:   1. Skin ulcer of second toe of right foot, limited to breakdown of skin ([place])      Plan:  Patient was evaluated and treated and all questions answered.  Debrided the overlying ulcer of all nonviable tissue and expressed a small amount of drainage deep to this.  I took a culture of that.  I think giving the chronicity and depth we should evaluate for lab work for any underlying bony infection.  This was given to him to be completed.  I think it would consider revision of the arthroplasty or arthrodesis and excise the ulcer at this point.  We will discuss with Dr. Milinda Pointer  Return in about 2 weeks (around 01/05/2021) for wound care.

## 2021-01-02 ENCOUNTER — Ambulatory Visit (INDEPENDENT_AMBULATORY_CARE_PROVIDER_SITE_OTHER): Payer: Federal, State, Local not specified - PPO | Admitting: Gastroenterology

## 2021-01-02 ENCOUNTER — Encounter: Payer: Self-pay | Admitting: Gastroenterology

## 2021-01-02 VITALS — BP 110/64 | HR 76 | Ht 71.0 in | Wt 224.5 lb

## 2021-01-02 DIAGNOSIS — K5730 Diverticulosis of large intestine without perforation or abscess without bleeding: Secondary | ICD-10-CM | POA: Diagnosis not present

## 2021-01-02 DIAGNOSIS — K2270 Barrett's esophagus without dysplasia: Secondary | ICD-10-CM

## 2021-01-02 DIAGNOSIS — Z9889 Other specified postprocedural states: Secondary | ICD-10-CM | POA: Diagnosis not present

## 2021-01-02 DIAGNOSIS — Z86010 Personal history of colonic polyps: Secondary | ICD-10-CM

## 2021-01-02 DIAGNOSIS — K219 Gastro-esophageal reflux disease without esophagitis: Secondary | ICD-10-CM | POA: Diagnosis not present

## 2021-01-02 NOTE — Patient Instructions (Signed)
If you are age 74 or older, your body mass index should be between 23-30. Your Body mass index is 31.31 kg/m. If this is out of the aforementioned range listed, please consider follow up with your Primary Care Provider.  If you are age 103 or younger, your body mass index should be between 19-25. Your Body mass index is 31.31 kg/m. If this is out of the aformentioned range listed, please consider follow up with your Primary Care Provider.   Please purchase the following medications over the counter and take as directed:  Miralax and Ducolax '5mg'$  tablets  Due to recent changes in healthcare laws, you may see the results of your imaging and laboratory studies on MyChart before your provider has had a chance to review them.  We understand that in some cases there may be results that are confusing or concerning to you. Not all laboratory results come back in the same time frame and the provider may be waiting for multiple results in order to interpret others.  Please give [place] 48 hours in order for your provider to thoroughly review all the results before contacting the office for clarification of your results.   Thank you for choosing me and [facility].  Vito Cirigliano, D.O.

## 2021-01-02 NOTE — Progress Notes (Signed)
Chief Complaint: GERD, Barrett's esophagus, colon cancer screening, colon polyps  Referring Provider:     Libby Maw, MD   HPI:     Alex Boyle is a 75 y.o. male with a history of CAD with prior MI/PCI, HTN, HLD, OSA, diverticulosis s/p partial colectomy, asthma, referred to the [facility] for evaluation of GERD and colon cancer screening.  He reports a longstanding history of reflux and prior diagnosis of Barrett's Esophagus. Index sxs of HB, regurgitation, raspy voice. Currently treated with Prilosec 20 mg BID for the last few years with overall good control.  Does have breakthrough if eating too close to bedtime or some dietary indiscretion. Did have a prior Nissen fundoplication approx 15 years ago. Had previously had yearly endoscopy in [place], [place], but last was approx 2019 prior to moving to [place].  Has gained 20# over the year or so.  He is currently enrolled in the bone density study at [facility] and maintains a strict 2000-calorie/day diet, but not allowed to exercise aside from routine daily activities.   Separately, history of diverticulosis c/b diverticulitis with perforation requiring partial colectomy in 1990.  Since then has had multiple colonoscopies notable for colon polyps.  He thinks his last colonoscopy was around 2017, but not sure of results.    Has had 7 abdominal hernia surgeries in the past (inguinal and ventral hernia repairs).   Not currenlty taking any anticoagulation/antiplatelet.   MGM with colitis, but o/w no known family history of CRC, GI malignancy, liver disease, pancreatic disease, or IBD.    CBC Latest Ref Rng & Units 12/22/2020 11/15/2020 10/31/2018  WBC 3.8 - 10.8 Thousand/uL 7.1 7.0 6.9  Hemoglobin 13.2 - 17.1 g/dL 16.3 17.1(H) 16.4  Hematocrit 38.5 - 50.0 % 47.5 49.9 48.0  Platelets 140 - 400 Thousand/uL 182 183.0 193   CMP Latest Ref Rng & Units 11/15/2020 04/28/2020 10/31/2018  Glucose 70 - 99  mg/dL 103(H) 86 98  BUN 6 - 23 mg/dL '17 11 10  '$ Creatinine 0.40 - 1.50 mg/dL 0.79 0.89 0.77  Sodium 135 - 145 mEq/L 139 139 139  Potassium 3.5 - 5.1 mEq/L 4.8 4.4 4.2  Chloride 96 - 112 mEq/L 99 100 99  CO2 19 - 32 mEq/L 29 33(H) 29  Calcium 8.4 - 10.5 mg/dL 9.9 10.1 9.6  Total Protein 6.0 - 8.3 g/dL 7.3 7.6 7.0  Total Bilirubin 0.2 - 1.2 mg/dL 0.8 0.5 0.5  Alkaline Phos 39 - 117 U/L 57 49 -  AST 0 - 37 U/L '25 26 24  '$ ALT 0 - 53 U/L 39 31 30     Past Medical History:  Diagnosis Date   Allergy    Arthritis    Asthma    Diverticulitis    Hyperlipidemia    Hypertension    Myocardial infarction ([place])    Sleep apnea    Sleep apnea in adult      Past Surgical History:  Procedure Laterality Date   COLON SURGERY     CORONARY STENT PLACEMENT     x2   FOOT SURGERY     JOINT REPLACEMENT     knee   LAPAROSCOPIC TRANSABDOMINAL HERNIA     MANDIBLE SURGERY     skin cancer     basil cell removed on back   Family History  Adopted: Yes  Problem Relation Age of Onset   Heart disease Mother  Hypertension Mother    Arthritis Mother    Alzheimer's disease Mother    Colon cancer Neg Hx    Pancreatic cancer Neg Hx    Social History   Tobacco Use   Smoking status: Never   Smokeless tobacco: Never  Vaping Use   Vaping Use: Former  Substance Use Topics   Alcohol use: Yes    Comment: soically    Drug use: Never   Current Outpatient Medications  Medication Sig Dispense Refill   albuterol (PROVENTIL HFA;VENTOLIN HFA) 108 (90 Base) MCG/ACT inhaler Inhale 1-2 puffs into the lungs every 6 (six) hours as needed for wheezing or shortness of breath. 1 Inhaler 12   betamethasone valerate (VALISONE) 0.1 % cream SMARTSIG:1 Application Topical 1-2 Times Daily PRN     BREO ELLIPTA 100-25 MCG/INH AEPB INHALE ONE PUFF BY MOUTH ONE TIME DAILY 60 each 12   Cholecalciferol 25 MCG (1000 UT) capsule Take by mouth.     clindamycin (CLEOCIN) 150 MG capsule Indications: treatment to prevent  bacterial infection of a heart valve. Take '600mg'$  as directed 1 hour prior to dental work     finasteride (PROSCAR) 5 MG tablet Take 1qd (Plz sched appt with new provider for future fills) 90 tablet 1   lisinopril (ZESTRIL) 5 MG tablet Take by mouth.     metoprolol tartrate (LOPRESSOR) 25 MG tablet TAKE ONE TABLET BY MOUTH TWICE A DAY 180 tablet 1   montelukast (SINGULAIR) 10 MG tablet Take by mouth.     nitroGLYCERIN (NITROSTAT) 0.4 MG SL tablet Place under the tongue.     rosuvastatin (CRESTOR) 40 MG tablet Take 1 tablet (40 mg total) by mouth daily. 90 tablet 1   tamsulosin (FLOMAX) 0.4 MG CAPS capsule Take 1qd (Plz sched appt with new provider for future fills) 90 capsule 0   doxycycline (VIBRA-TABS) 100 MG tablet Take 1 tablet (100 mg total) by mouth 2 (two) times daily. 14 tablet 0   montelukast (SINGULAIR) 10 MG tablet TAKE ONE TABLET BY MOUTH AT BEDTIME *NEEDS APPOINTMENT FOR REFILLS* 30 tablet 0   omeprazole (PRILOSEC) 20 MG capsule Take by mouth.     No current facility-administered medications for this visit.   Allergies  Allergen Reactions   Codeine Shortness Of Breath   Gramineae Pollens Itching   Penicillins Swelling     Review of Systems: All systems reviewed and negative except where noted in HPI.     Physical Exam:    Wt Readings from Last 3 Encounters:  01/02/21 224 lb 8 oz (101.8 kg)  11/14/20 227 lb (103 kg)  05/19/20 225 lb 1.3 oz (102.1 kg)    BP 110/64   Pulse 76   Ht '5\' 11"'$  (1.803 m)   Wt 224 lb 8 oz (101.8 kg)   SpO2 96%   BMI 31.31 kg/m  Constitutional:  Pleasant, in no acute distress. Psychiatric: Normal mood and affect. Behavior is normal. EENT: Pupils normal.  Conjunctivae are normal. No scleral icterus. Neck supple. No cervical LAD. Cardiovascular: Normal rate, regular rhythm. No edema Pulmonary/chest: Effort normal and breath sounds normal. No wheezing, rales or rhonchi. Abdominal: Large well-healed midline incision, ventral hernia.  Soft,  nondistended, nontender. Bowel sounds active throughout. There are no masses palpable. No hepatomegaly. Neurological: Alert and oriented to person place and time. Skin: Skin is warm and dry. No rashes noted.   ASSESSMENT AND PLAN;   1) GERD 2) History of Barrett's Esophagus 3) History of Nissen fundoplication - EGD to evaluate  for Barrett's esophagus - Request records from previous GI for review - Continue omeprazole as currently prescribed - Continue antireflux lifestyle/dietary modifications  4) History of colon polyps 5) History of diverticulosis c/b complex diverticulitis requiring partial colectomy - Colonoscopy for ongoing polyp surveillance - Request records from previous GI for review  The indications, risks, and benefits of EGD and colonoscopy were explained to the patient in detail. Risks include but are not limited to bleeding, perforation, adverse reaction to medications, and cardiopulmonary compromise. Sequelae include but are not limited to the possibility of surgery, hospitalization, and mortality. The patient verbalized understanding and wished to proceed. All questions answered, referred to scheduler and bowel prep ordered. Further recommendations pending results of the exam.     Lavena Bullion, DO, FACG  01/02/2021, 10:27 AM   Ethelene Hal Mortimer Fries,*

## 2021-01-05 ENCOUNTER — Other Ambulatory Visit: Payer: Self-pay

## 2021-01-05 ENCOUNTER — Ambulatory Visit (INDEPENDENT_AMBULATORY_CARE_PROVIDER_SITE_OTHER): Payer: Federal, State, Local not specified - PPO | Admitting: Podiatry

## 2021-01-05 VITALS — Temp 98.3°F

## 2021-01-05 DIAGNOSIS — L97511 Non-pressure chronic ulcer of other part of right foot limited to breakdown of skin: Secondary | ICD-10-CM

## 2021-01-05 DIAGNOSIS — M2041 Other hammer toe(s) (acquired), right foot: Secondary | ICD-10-CM

## 2021-01-05 NOTE — Progress Notes (Signed)
  Subjective:  Patient ID: Alex Boyle, male    DOB: [birth year],  MRN: [id number]  Chief Complaint  Patient presents with   Foot Ulcer    Patient is taking antibiotics as directed on day 5 out of 14. Patient states he feels the infection is getting better but is still having some pain 3 out of 10 at this time.     75 y.o. male returns for follow-up with the above complaint. History confirmed with patient.  He has been taking the antibiotics.  Denies fever chills nausea vomiting  Objective:  Physical Exam: warm, good capillary refill, no trophic changes or ulcerative lesions, normal DP and PT pulses, and normal sensory exam.  Right second toe small ulcer with overlying dried blood and fibrotic scab, debridement reveals deep probing sinus tract with small area of drainage, appearance similar to previous visit    Assessment:   1. Skin ulcer of second toe of right foot, limited to breakdown of skin ([place])   2. Hammer toe of right foot      Plan:  Patient was evaluated and treated and all questions answered.  We again discussed further treatment options including continued long-term suppressive antibiotics as well as local wound care with advanced tissue products.  We discussed this would likely take a longer amount of time but we could avoid surgery.  There is still a chance that this could fail.  I discussed with him that likely the fastest way to eliminate this at this point would be excision of the ulcer with the underlying bone and pinning of the toe to allow to fuse again.  He would like to go this route and we discussed the risk benefits and potential complications of this.  We also discussed the risk amputation if it fails or becomes infected.  Informed consent was signed and reviewed to be scheduled for next Friday   Surgical plan:  Procedure: -Right second toe excision of bone of phalanx of toe and pinning  Location: -[place]  Anesthesia  plan: -IV sedation with local anesthesia  Postoperative pain plan: - Tylenol 1000 mg every 6 hours, ibuprofen 600 mg every 6 hours, gabapentin 300 mg every 8 hours x5 days, oxycodone 5 mg 1-2 tabs every 6 hours only as needed  DVT prophylaxis: -None required  WB Restrictions / DME needs: -He has a surgical boot we will bring to surgery  No follow-ups on file.

## 2021-01-16 ENCOUNTER — Other Ambulatory Visit: Payer: Self-pay | Admitting: Family Medicine

## 2021-01-19 ENCOUNTER — Encounter: Payer: Federal, State, Local not specified - PPO | Admitting: Podiatry

## 2021-01-20 ENCOUNTER — Encounter: Payer: Federal, State, Local not specified - PPO | Attending: Physician Assistant | Admitting: Physician Assistant

## 2021-01-20 ENCOUNTER — Other Ambulatory Visit: Payer: Self-pay

## 2021-01-20 DIAGNOSIS — L97516 Non-pressure chronic ulcer of other part of right foot with bone involvement without evidence of necrosis: Secondary | ICD-10-CM | POA: Diagnosis not present

## 2021-01-20 DIAGNOSIS — Z885 Allergy status to narcotic agent status: Secondary | ICD-10-CM | POA: Insufficient documentation

## 2021-01-20 DIAGNOSIS — Z880 Allergy status to penicillin: Secondary | ICD-10-CM | POA: Diagnosis not present

## 2021-01-20 DIAGNOSIS — G4733 Obstructive sleep apnea (adult) (pediatric): Secondary | ICD-10-CM | POA: Diagnosis not present

## 2021-01-20 DIAGNOSIS — G629 Polyneuropathy, unspecified: Secondary | ICD-10-CM | POA: Insufficient documentation

## 2021-01-20 DIAGNOSIS — T8131XA Disruption of external operation (surgical) wound, not elsewhere classified, initial encounter: Secondary | ICD-10-CM | POA: Diagnosis not present

## 2021-01-20 NOTE — Progress Notes (Signed)
LAZAR, FUNARO ([id number]) Visit Report for 01/20/2021 Allergy List Details Patient Name: Alex Boyle, Alex Boyle. Date of Service: 01/20/2021 12:45 PM Medical Record Number: [id number] Patient Account Number: [id number] Date of Birth/Sex: Jan 24, 1946 (76 y.o. Male) Treating RN: Dolan Amen Primary Care Jevaeh Shams: Abelino Derrick Other Clinician: Referring Davetta Olliff: Referral, Self Treating Ka Bench/Extender: Jeri Cos Weeks in Treatment: 0 Allergies Active Allergies codeine penicillin Allergy Notes Electronic Signature(s) Signed: 01/20/2021 5:03:01 PM By: Dolan Amen RN Entered By: Dolan Amen on 01/20/2021 13:23:10 Alex Boyle ([id number]) -------------------------------------------------------------------------------- Arrival Information Details Patient Name: Alex Boyle Date of Service: 01/20/2021 12:45 PM Medical Record Number: [id number] Patient Account Number: [id number] Date of Birth/Sex: 02/12/1946 (75 y.o. Male) Treating RN: Dolan Amen Primary Care Tamim Skog: Abelino Derrick Other Clinician: Referring Zainab Crumrine: Referral, Self Treating Melaine Mcphee/Extender: Skipper Cliche in Treatment: 0 Visit Information Patient Arrived: Ambulatory Arrival Time: 13:13 Accompanied By: self Transfer Assistance: None Patient Identification Verified: Yes Secondary Verification Process Completed: Yes Electronic Signature(s) Signed: 01/20/2021 5:03:01 PM By: Dolan Amen RN Entered By: Dolan Amen on 01/20/2021 13:13:29 Alex Boyle ([id number]) -------------------------------------------------------------------------------- Clinic Level of Care Assessment Details Patient Name: Alex Boyle Date of Service: 01/20/2021 12:45 PM Medical Record Number: [id number] Patient Account Number: [id number] Date of Birth/Sex: 02-09-1946 (75 y.o. Male) Treating RN: Dolan Amen Primary Care Lynsay Fesperman: Abelino Derrick Other Clinician: Referring Waniya Hoglund: Referral, Self Treating  Ariell Gunnels/Extender: Skipper Cliche in Treatment: 0 Clinic Level of Care Assessment Items TOOL 2 Quantity Alex Boyle X - Use when only an EandM is performed on the INITIAL visit 1 0 ASSESSMENTS - Nursing Assessment / Reassessment X - General Physical Exam (combine w/ comprehensive assessment (listed just below) when performed on new 1 20 pt. evals) X- 1 25 Comprehensive Assessment (HX, ROS, Risk Assessments, Wounds Hx, etc.) ASSESSMENTS - Wound and Skin Assessment / Reassessment X - Simple Wound Assessment / Reassessment - one wound 1 5 '[]'$  - 0 Complex Wound Assessment / Reassessment - multiple wounds '[]'$  - 0 Dermatologic / Skin Assessment (not related to wound area) ASSESSMENTS - Ostomy and/or Continence Assessment and Care '[]'$  - Incontinence Assessment and Management 0 '[]'$  - 0 Ostomy Care Assessment and Management (repouching, etc.) PROCESS - Coordination of Care X - Simple Patient / Family Education for ongoing care 1 15 '[]'$  - 0 Complex (extensive) Patient / Family Education for ongoing care X- 1 10 Staff obtains Programmer, systems, Records, Test Results / Process Orders '[]'$  - 0 Staff telephones HHA, Nursing Homes / Clarify orders / etc '[]'$  - 0 Routine Transfer to another Facility (non-emergent condition) '[]'$  - 0 Routine Hospital Admission (non-emergent condition) '[]'$  - 0 New Admissions / Biomedical engineer / Ordering NPWT, Apligraf, etc. '[]'$  - 0 Emergency Hospital Admission (emergent condition) X- 1 10 Simple Discharge Coordination '[]'$  - 0 Complex (extensive) Discharge Coordination PROCESS - Special Needs '[]'$  - Pediatric / Minor Patient Management 0 '[]'$  - 0 Isolation Patient Management '[]'$  - 0 Hearing / Language / Visual special needs '[]'$  - 0 Assessment of Community assistance (transportation, D/C planning, etc.) '[]'$  - 0 Additional assistance / Altered mentation '[]'$  - 0 Support Surface(s) Assessment (bed, cushion, seat, etc.) INTERVENTIONS - Wound Cleansing / Measurement X - Wound  Imaging (photographs - any number of wounds) 1 5 '[]'$  - 0 Wound Tracing (instead of photographs) X- 1 5 Simple Wound Measurement - one wound '[]'$  - 0 Complex Wound Measurement - multiple wounds Alex Boyle, Alex Boyle. ([id number]) X- 1 5 Simple Wound Cleansing - one wound '[]'$  -  0 Complex Wound Cleansing - multiple wounds INTERVENTIONS - Wound Dressings '[]'$  - Small Wound Dressing one or multiple wounds 0 X- 1 15 Medium Wound Dressing one or multiple wounds '[]'$  - 0 Large Wound Dressing one or multiple wounds '[]'$  - 0 Application of Medications - injection INTERVENTIONS - Miscellaneous '[]'$  - External ear exam 0 '[]'$  - 0 Specimen Collection (cultures, biopsies, blood, body fluids, etc.) '[]'$  - 0 Specimen(s) / Culture(s) sent or taken to Lab for analysis '[]'$  - 0 Patient Transfer (multiple staff / Harrel Lemon Lift / Similar devices) '[]'$  - 0 Simple Staple / Suture removal (25 or less) '[]'$  - 0 Complex Staple / Suture removal (26 or more) '[]'$  - 0 Hypo / Hyperglycemic Management (close monitor of Blood Glucose) '[]'$  - 0 Ankle / Brachial Index (ABI) - do not check if billed separately Has the patient been seen at the hospital within the last three years: Yes Total Alex Boyle: 115 Level Of Care: New/Established - Level 3 Electronic Signature(s) Signed: 01/20/2021 5:03:01 PM By: Dolan Amen RN Entered By: Dolan Amen on 01/20/2021 14:18:05 Alex Boyle ([id number]) -------------------------------------------------------------------------------- Encounter Discharge Information Details Patient Name: Alex Boyle Date of Service: 01/20/2021 12:45 PM Medical Record Number: [id number] Patient Account Number: [id number] Date of Birth/Sex: 08-15-1945 (75 y.o. Male) Treating RN: Dolan Amen Primary Care Antjuan Rothe: Abelino Derrick Other Clinician: Referring Zailee Vallely: Referral, Self Treating Salena Ortlieb/Extender: Skipper Cliche in Treatment: 0 Encounter Discharge Information Items Post Procedure Vitals Discharge  Condition: Stable Temperature (F): 98.6 Ambulatory Status: Ambulatory Pulse (bpm): 79 Discharge Destination: Home Respiratory Rate (breaths/min): 18 Transportation: Private Auto Blood Pressure (mmHg): 139/74 Accompanied By: self Schedule Follow-up Appointment: Yes Clinical Summary of Care: Electronic Signature(s) Signed: 01/20/2021 4:51:16 PM By: Dolan Amen RN Entered By: Dolan Amen on 01/20/2021 16:51:16 Alex Boyle ([id number]) -------------------------------------------------------------------------------- Lower Extremity Assessment Details Patient Name: Alex Boyle Date of Service: 01/20/2021 12:45 PM Medical Record Number: [id number] Patient Account Number: [id number] Date of Birth/Sex: May 18, 1946 (75 y.o. Male) Treating RN: Dolan Amen Primary Care Aleira Deiter: Abelino Derrick Other Clinician: Referring Crispin Vogel: Referral, Self Treating Genie Mirabal/Extender: Jeri Cos Weeks in Treatment: 0 Edema Assessment Assessed: [Left: No] [Right: Yes] Edema: [Left: N] [Right: o] Vascular Assessment Pulses: Dorsalis Pedis Palpable: [Right:Yes] Notes ABI's completed in 11/23/20 Electronic Signature(s) Signed: 01/20/2021 5:03:01 PM By: Dolan Amen RN Entered By: Dolan Amen on 01/20/2021 13:42:46 Alex Boyle ([id number]) -------------------------------------------------------------------------------- Multi Wound Chart Details Patient Name: Alex Boyle Date of Service: 01/20/2021 12:45 PM Medical Record Number: [id number] Patient Account Number: [id number] Date of Birth/Sex: [birth year] (75 y.o. Male) Treating RN: Dolan Amen Primary Care Tinesha Siegrist: Abelino Derrick Other Clinician: Referring Ginnie Marich: Referral, Self Treating Tausha Milhoan/Extender: Skipper Cliche in Treatment: 0 Vital Signs Height(in): Pulse(bpm): 22 Weight(lbs): 220 Blood Pressure(mmHg): 139/74 Body Mass Index(BMI): Temperature(F): 98.6 Respiratory Rate(breaths/min): 18 Photos:  [N/A:N/A] Wound Location: Right Toe Second N/A N/A Wounding Event: Surgical Injury N/A N/A Primary Etiology: Open Surgical Wound N/A N/A Comorbid History: Asthma, Sleep Apnea, N/A N/A Hypertension, Neuropathy Date Acquired: 06/11/2020 N/A N/A Weeks of Treatment: 0 N/A N/A Wound Status: Open N/A N/A Measurements L x W x D (cm) 0.5x0.5x0.4 N/A N/A Area (cm) : 0.196 N/A N/A Volume (cm) : 0.079 N/A N/A Classification: Full Thickness With Exposed N/A N/A Support Structures Exudate Amount: Medium N/A N/A Exudate Type: Serosanguineous N/A N/A Exudate Color: red, brown N/A N/A Granulation Amount: None Present (0%) N/A N/A Necrotic Amount: Large (67-100%) N/A N/A Exposed Structures: Fat Layer (Subcutaneous Tissue):  N/A N/A Yes Tendon: Yes Bone: Yes Fascia: No Muscle: No Joint: No Epithelialization: None N/A N/A Treatment Notes Electronic Signature(s) Signed: 01/20/2021 5:03:01 PM By: Dolan Amen RN Entered By: Dolan Amen on 01/20/2021 14:07:47 Alex Boyle ([id number]) -------------------------------------------------------------------------------- [place] Details Patient Name: Alex Boyle Date of Service: 01/20/2021 12:45 PM Medical Record Number: [id number] Patient Account Number: [id number] Date of Birth/Sex: Jun 01, 1946 (75 y.o. Male) Treating RN: Dolan Amen Primary Care Tineshia Becraft: Abelino Derrick Other Clinician: Referring Elliotte Marsalis: Referral, Self Treating Vallie Fayette/Extender: Skipper Cliche in Treatment: 0 Active Inactive Orientation to the Wound Care Program Nursing Diagnoses: Knowledge deficit related to the wound healing center program Goals: Patient/caregiver will verbalize understanding of the [facility] Program Date Initiated: 01/20/2021 Target Resolution Date: 01/20/2021 Goal Status: Active Interventions: Provide education on orientation to the wound center Notes: Wound/Skin Impairment Nursing Diagnoses: Impaired  tissue integrity Goals: Patient/caregiver will verbalize understanding of skin care regimen Date Initiated: 01/20/2021 Target Resolution Date: 01/20/2021 Goal Status: Active Ulcer/skin breakdown will have a volume reduction of 30% by week 4 Date Initiated: 01/20/2021 Target Resolution Date: 02/20/2021 Goal Status: Active Ulcer/skin breakdown will have a volume reduction of 50% by week 8 Date Initiated: 01/20/2021 Target Resolution Date: 03/22/2021 Goal Status: Active Ulcer/skin breakdown will have a volume reduction of 80% by week 12 Date Initiated: 01/20/2021 Target Resolution Date: 04/22/2021 Goal Status: Active Ulcer/skin breakdown will heal within 14 weeks Date Initiated: 01/20/2021 Target Resolution Date: 05/22/2021 Goal Status: Active Interventions: Assess patient/caregiver ability to obtain necessary supplies Assess patient/caregiver ability to perform ulcer/skin care regimen upon admission and as needed Assess ulceration(s) every visit Provide education on ulcer and skin care Treatment Activities: Skin care regimen initiated : 01/20/2021 Notes: Electronic Signature(s) Signed: 01/20/2021 5:03:01 PM By: Dolan Amen RN Entered By: Dolan Amen on 01/20/2021 14:07:36 Alex Boyle ([id number]) -------------------------------------------------------------------------------- Pain Assessment Details Patient Name: Alex Boyle Date of Service: 01/20/2021 12:45 PM Medical Record Number: [id number] Patient Account Number: [id number] Date of Birth/Sex: Apr 25, 1946 (76 y.o. Male) Treating RN: Dolan Amen Primary Care Dawnmarie Breon: Abelino Derrick Other Clinician: Referring Korey Arroyo: Referral, Self Treating Horace Lukas/Extender: Skipper Cliche in Treatment: 0 Active Problems Location of Pain Severity and Description of Pain Patient Has Paino No Site Locations Rate the pain. Current Pain Level: 0 Pain Management and Medication Current Pain Management: Electronic  Signature(s) Signed: 01/20/2021 5:03:01 PM By: Dolan Amen RN Entered By: Dolan Amen on 01/20/2021 13:20:43 Alex Boyle ([id number]) -------------------------------------------------------------------------------- Patient/Caregiver Education Details Patient Name: Alex Boyle Date of Service: 01/20/2021 12:45 PM Medical Record Number: [id number] Patient Account Number: [id number] Date of Birth/Gender: 08/23/1945 (75 y.o. Male) Treating RN: Dolan Amen Primary Care Physician: Abelino Derrick Other Clinician: Referring Physician: Referral, Self Treating Physician/Extender: Skipper Cliche in Treatment: 0 Education Assessment Education Provided To: Patient Education Topics Provided Welcome To [facility]: Methods: Explain/Verbal Responses: State content correctly Wound/Skin Impairment: Methods: Explain/Verbal Responses: State content correctly Electronic Signature(s) Signed: 01/20/2021 5:03:01 PM By: Dolan Amen RN Entered By: Dolan Amen on 01/20/2021 14:18:25 Alex Boyle ([id number]) -------------------------------------------------------------------------------- Wound Assessment Details Patient Name: Alex Boyle Date of Service: 01/20/2021 12:45 PM Medical Record Number: [id number] Patient Account Number: [id number] Date of Birth/Sex: 08/16/1945 (75 y.o. Male) Treating RN: Dolan Amen Primary Care Torian Quintero: Abelino Derrick Other Clinician: Referring Neel Buffone: Referral, Self Treating Noemi Bellissimo/Extender: Jeri Cos Weeks in Treatment: 0 Wound Status Wound Number: 1 Primary Etiology: Open Surgical Wound Wound Location: Right Toe Second  Wound Status: Open Wounding Event: Surgical Injury Comorbid History: Asthma, Sleep Apnea, Hypertension, Neuropathy Date Acquired: 06/11/2020 Weeks Of Treatment: 0 Clustered Wound: No Photos Wound Measurements Length: (cm) 0.5 Width: (cm) 0.5 Depth: (cm) 0.4 Area: (cm) 0.196 Volume: (cm)  0.079 % Reduction in Area: % Reduction in Volume: Epithelialization: None Tunneling: No Undermining: No Wound Description Classification: Full Thickness With Exposed Support Structures Exudate Amount: Medium Exudate Type: Serosanguineous Exudate Color: red, brown Foul Odor After Cleansing: No Slough/Fibrino Yes Wound Bed Granulation Amount: None Present (0%) Exposed Structure Necrotic Amount: Large (67-100%) Fascia Exposed: No Necrotic Quality: Adherent Slough Fat Layer (Subcutaneous Tissue) Exposed: Yes Tendon Exposed: Yes Muscle Exposed: No Joint Exposed: No Bone Exposed: Yes Electronic Signature(s) Signed: 01/20/2021 5:03:01 PM By: Dolan Amen RN Entered By: Dolan Amen on 01/20/2021 13:36:26 Alex Boyle ([id number]) -------------------------------------------------------------------------------- Vitals Details Patient Name: Alex Boyle Date of Service: 01/20/2021 12:45 PM Medical Record Number: [id number] Patient Account Number: [id number] Date of Birth/Sex: January 18, 1946 (75 y.o. Male) Treating RN: Dolan Amen Primary Care Terral Cooks: Abelino Derrick Other Clinician: Referring Errick Salts: Referral, Self Treating Haziel Molner/Extender: Skipper Cliche in Treatment: 0 Vital Signs Time Taken: 13:20 Temperature (F): 98.6 Weight (lbs): 220 Pulse (bpm): 79 Source: Stated Respiratory Rate (breaths/min): 18 Blood Pressure (mmHg): 139/74 Reference Range: 80 - 120 mg / dl Electronic Signature(s) Signed: 01/20/2021 5:03:01 PM By: Dolan Amen RN Entered By: Dolan Amen on 01/20/2021 13:21:31

## 2021-01-20 NOTE — Progress Notes (Addendum)
LEDFORD, GOODSON ([id number]) Visit Report for 01/20/2021 Chief Complaint Document Details Patient Name: Alex Boyle, Alex Boyle. Date of Service: 01/20/2021 12:45 PM Medical Record Number: [id number] Patient Account Number: [id number] Date of Birth/Sex: Oct 27, 1945 (75 y.o. Male) Treating RN: Dolan Amen Primary Care Provider: Abelino Derrick Other Clinician: Referring Provider: Referral, Self Treating Provider/Extender: Skipper Cliche in Treatment: 0 Information Obtained from: Patient Chief Complaint Right 2nd Toe Ulcer Electronic Signature(s) Signed: 01/20/2021 2:01:05 PM By: Worthy Keeler PA-C Entered By: Worthy Keeler on 01/20/2021 14:01:05 Alex Boyle ([id number]) -------------------------------------------------------------------------------- Debridement Details Patient Name: Alex Boyle Date of Service: 01/20/2021 12:45 PM Medical Record Number: [id number] Patient Account Number: [id number] Date of Birth/Sex: [birth year] (75 y.o. Male) Treating RN: Dolan Amen Primary Care Provider: Abelino Derrick Other Clinician: Referring Provider: Referral, Self Treating Provider/Extender: Skipper Cliche in Treatment: 0 Debridement Performed for Wound #1 Right Toe Second Assessment: Performed By: Physician Tommie Sams., PA-C Debridement Type: Chemical/Enzymatic/Mechanical Agent Used: saline gauze Level of Consciousness (Pre- Awake and Alert procedure): Pre-procedure Verification/Time Out Yes - 14:10 Taken: Start Time: 14:10 Instrument: Other : gauze Bleeding: None Response to Treatment: Procedure was tolerated well Level of Consciousness (Post- Awake and Alert procedure): Post Debridement Measurements of Total Wound Length: (cm) 0.5 Width: (cm) 0.5 Depth: (cm) 0.4 Volume: (cm) 0.079 Character of Wound/Ulcer Post Debridement: Stable Post Procedure Diagnosis Same as Pre-procedure Electronic Signature(s) Signed: 01/20/2021 5:03:01 PM By: Dolan Amen  RN Signed: 01/20/2021 5:54:54 PM By: Worthy Keeler PA-C Entered By: Dolan Amen on 01/20/2021 14:10:51 Alex Boyle ([id number]) -------------------------------------------------------------------------------- HPI Details Patient Name: Alex Boyle Date of Service: 01/20/2021 12:45 PM Medical Record Number: [id number] Patient Account Number: [id number] Date of Birth/Sex: 23-Mar-1946 (75 y.o. Male) Treating RN: Dolan Amen Primary Care Provider: Abelino Derrick Other Clinician: Referring Provider: Referral, Self Treating Provider/Extender: Skipper Cliche in Treatment: 0 History of Present Illness HPI Description: 01/20/2021 upon evaluation today patient appears to be doing somewhat poorly in regard to a surgery that he had that was actually on 06/10/2020. He tells me that the toe was subsequently a hammertoe and this needed to be corrected by surgical intervention. When they got and the joint was so deteriorated and ended up having to actually pin and fuse the toe. This no longer Britta Mccreedy has given him some trouble he tells me. With that being said he has been seeing [facility], Dr. Sherryle Lis, and the only thing that is really been mentioned is going to try to remove a portion of the toe in order to basically it sounds like refuse this. Nonetheless I really do not know that the patient is interested in going down that road at this point based on what I am hearing. He does have noted culture on 12/22/2020 which was positive for Serratia. Nonetheless he has been on doxycycline though that just ended today. I would probably extend this for him due to the fact that I want him to be on this plenty in order to justify a good trial of antibiotics as well as wound care before potential for hyperbarics if he does indeed have osteomyelitis noted. The good news is his ABIs are on the right 1.38 with a TBI of 0.79 and on the left 1.21 with a TBI of 0.70. This was on 11/23/2020. Notably the  patient is obviously very frustrated with the situation he has not had an MRI at this point since the surgery. I think that is really what we need  to look into doing. Electronic Signature(s) Signed: 01/20/2021 5:51:02 PM By: Worthy Keeler PA-C Entered By: Worthy Keeler on 01/20/2021 17:51:02 Alex Boyle ([id number]) -------------------------------------------------------------------------------- Physical Exam Details Patient Name: Alex Boyle Date of Service: 01/20/2021 12:45 PM Medical Record Number: [id number] Patient Account Number: [id number] Date of Birth/Sex: [birth year] (75 y.o. Male) Treating RN: Dolan Amen Primary Care Provider: Abelino Derrick Other Clinician: Referring Provider: Referral, Self Treating Provider/Extender: Jeri Cos Weeks in Treatment: 0 Constitutional sitting or standing blood pressure is within target range for patient.. pulse regular and within target range for patient.Marland Kitchen respirations regular, non- labored and within target range for patient.Marland Kitchen temperature within target range for patient.. Well-nourished and well-hydrated in no acute distress. Eyes conjunctiva clear no eyelid edema noted. pupils equal round and reactive to light and accommodation. Ears, Nose, Mouth, and Throat no gross abnormality of ear auricles or external auditory canals. normal hearing noted during conversation. mucus membranes moist. Respiratory normal breathing without difficulty. Cardiovascular 2+ dorsalis pedis/posterior tibialis pulses. no clubbing, cyanosis, significant edema, <3 sec cap refill. Musculoskeletal normal gait and posture. no significant deformity or arthritic changes, no loss or range of motion, no clubbing. Psychiatric this patient is able to make decisions and demonstrates good insight into disease process. Alert and Oriented x 3. pleasant and cooperative. Notes Upon inspection patient's wound bed actually showed signs of bone exposed in the base of the  wound which does have me a bit concerned about osteomyelitis as I am not certain this is really as solid as I would like to see. Fortunately there does not appear to be any signs of infection spreading up the foot although there is some erythema in the toe itself. He tells me the redness seems to have gotten a little bit worse. Electronic Signature(s) Signed: 01/20/2021 5:51:38 PM By: Worthy Keeler PA-C Entered By: Worthy Keeler on 01/20/2021 17:51:37 Wollschlager, Angus Seller ([id number]) -------------------------------------------------------------------------------- Physician Orders Details Patient Name: Alex Boyle Date of Service: 01/20/2021 12:45 PM Medical Record Number: [id number] Patient Account Number: [id number] Date of Birth/Sex: 02-17-1946 (75 y.o. 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 Bathing/ Shower/ Hygiene o May shower; gently cleanse wound with antibacterial soap, rinse and pat dry prior to dressing wounds Medications-Please add to medication list. o P.O. Antibiotics - Start doxycycline Wound Treatment Wound #1 - Toe Second Wound Laterality: Right Cleanser: Byram Ancillary Kit - 15 Day Supply (DME) (Generic) Every Other Day/30 Days Discharge Instructions: Use supplies as instructed; Kit contains: (15) Saline Bullets; (15) 3x3 Gauze; 15 pr Gloves Cleanser:  Normal Saline Every Other Day/30 Days Discharge Instructions: Wash your hands with soap and water. Remove old dressing, discard into plastic bag and place into trash. Cleanse the wound with Normal Saline prior to applying a clean dressing using gauze sponges, not tissues or cotton balls. Do not scrub or use excessive force. Pat dry using gauze sponges, not tissue or cotton balls. Primary Dressing: Prisma 4.34 (in) Every Other Day/30 Days Discharge Instructions: Moisten w/normal saline or sterile water; Cover wound  as directed. Do not remove from wound bed. Secondary Dressing: Conforming Guaze Roll-Small (DME) (Generic) Every Other Day/30 Days Discharge Instructions: [place] as directed Secondary Dressing: Gauze Every Other Day/30 Days Discharge Instructions: Apply over prisma to cover Secured With: 10M [place] Surgical Tape, 2x2 (in/yd) (DME) (Generic) Every Other Day/30 Days Discharge Instructions: Secure conform Radiology o MRI without Contrast - Right foot, special focus on 2nd toe Patient Medications Allergies: codeine, penicillin Notifications Medication Indication Start End doxycycline hyclate 01/20/2021 DOSE 1 - oral 100 mg capsule - 1 capsule oral taken 2 times per day for 14 days KARANVIR, BALDERSTON ([id number]) Electronic Signature(s) Signed: 01/20/2021 5:52:41 PM By: Worthy Keeler PA-C Previous Signature: 01/20/2021 4:47:05 PM Version By: Dolan Amen RN Entered By: Worthy Keeler on 01/20/2021 17:52:41 Hollopeter, Angus Seller ([id number]) -------------------------------------------------------------------------------- Problem List Details Patient Name: Alex Boyle Date of Service: 01/20/2021 12:45 PM Medical Record Number: [id number] Patient Account Number: [id number] Date of Birth/Sex: 12-Jan-1946 (75 y.o. 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 Male) Treating RN: Dolan Amen Primary Care Provider: Abelino Derrick Other Clinician: Referring Provider: Referral, Self Treating Provider/Extender: Skipper Cliche in Treatment: 0 Subjective Chief Complaint Information obtained from Patient Right 2nd Toe Ulcer History of Present Illness (HPI) 01/20/2021 upon evaluation today patient appears to be doing somewhat poorly in regard to a surgery that he had that was actually on 06/10/2020. He tells me that the toe was subsequently a hammertoe and this needed to be corrected by surgical intervention. When they got and the joint was so deteriorated and ended up having to actually pin and fuse the toe. This no longer Britta Mccreedy has given him some trouble he tells me. With that being said he has been seeing [facility], Dr. Sherryle Lis, and the only thing  that is really been mentioned is going to try to remove a portion of the toe in order to basically it sounds like refuse this. Nonetheless I really do not know that the patient is interested in going down that road at this point based on what I am hearing. He does have noted culture on 12/22/2020 which was positive for Serratia. Nonetheless he has been on doxycycline though that just ended today. I would probably extend this for him due to the fact that I want him to be on this plenty in order to justify a good trial of antibiotics as well as wound care before potential for hyperbarics if he does indeed have osteomyelitis noted. The good news is his ABIs are on the right 1.38 with a TBI of 0.79  and on the left 1.21 with a TBI of 0.70. This was on 11/23/2020. Notably the patient is obviously very frustrated with the situation he has not had an MRI at this point since the surgery. I think that is really what we need to look into doing. Patient History Information obtained from Patient. Allergies codeine, penicillin Social History Never smoker, Marital Status - Married, Alcohol Use - Rarely, Drug Use - No History, Caffeine Use - Never. Medical History Respiratory Patient has history of Asthma, Sleep Apnea Cardiovascular Patient has history of Hypertension, Myocardial Infarction Endocrine Denies history of Type I Diabetes, Type II Diabetes Genitourinary Denies history of End Stage Renal Disease Musculoskeletal Denies history of Gout, Rheumatoid Arthritis, Osteoarthritis, Osteomyelitis Neurologic Patient has history of Neuropathy Denies history of Seizure Disorder Review of Systems (ROS) Constitutional Symptoms (General Health) Denies complaints or symptoms of Fatigue, Fever, Chills, Marked Weight Change. Eyes Complains or has symptoms of Glasses / Contacts. Denies complaints or symptoms of Dry Eyes, Vision Changes. Ear/Nose/Mouth/Throat Denies complaints or symptoms of Difficult clearing  ears, Sinusitis. Hematologic/Lymphatic Denies complaints or symptoms of Bleeding / Clotting Disorders, Human Immunodeficiency Virus. Respiratory Denies complaints or symptoms of Chronic or frequent coughs, Shortness of Breath. Cardiovascular Denies complaints or symptoms of Chest pain, LE edema. Gastrointestinal Denies complaints or symptoms of Frequent diarrhea, Nausea, Vomiting. Endocrine Denies complaints or symptoms of Hepatitis, Thyroid disease, Polydypsia (Excessive Thirst). Genitourinary Denies complaints or symptoms of Kidney failure/ Dialysis, Incontinence/dribbling. Immunological Denies complaints or symptoms of Hives, Itching. KIKO, RIPP ([id number]) Integumentary (Skin) Complains or has symptoms of Wounds. Denies complaints or symptoms of Bleeding or bruising tendency, Breakdown, Swelling. Musculoskeletal Denies complaints or symptoms of Muscle Pain, Muscle Weakness. Neurologic Denies complaints or symptoms of Numbness/parasthesias, Focal/Weakness. Psychiatric Denies complaints or symptoms of Anxiety, Claustrophobia. General Notes: Hardware in ABD, metal stents in heart Objective Constitutional sitting or standing blood pressure is within target range for patient.. pulse regular and within target range for patient.Marland Kitchen respirations regular, non- labored and within target range for patient.Marland Kitchen temperature within target range for patient.. Well-nourished and well-hydrated in no acute distress. Vitals Time Taken: 1:20 PM, Weight: 220 lbs, Source: Stated, Temperature: 98.6 F, Pulse: 79 bpm, Respiratory Rate: 18 breaths/min, Blood Pressure: 139/74 mmHg. Eyes conjunctiva clear no eyelid edema noted. pupils equal round and reactive to light and accommodation. Ears, Nose, Mouth, and Throat no gross abnormality of ear auricles or external auditory canals. normal hearing noted during conversation. mucus membranes moist. Respiratory normal breathing without  difficulty. Cardiovascular 2+ dorsalis pedis/posterior tibialis pulses. no clubbing, cyanosis, significant edema, Musculoskeletal normal gait and posture. no significant deformity or arthritic changes, no loss or range of motion, no clubbing. Psychiatric this patient is able to make decisions and demonstrates good insight into disease process. Alert and Oriented x 3. pleasant and cooperative. General Notes: Upon inspection patient's wound bed actually showed signs of bone exposed in the base of the wound which does have me a bit concerned about osteomyelitis as I am not certain this is really as solid as I would like to see. Fortunately there does not appear to be any signs of infection spreading up the foot although there is some erythema in the toe itself. He tells me the redness seems to have gotten a little bit worse. Integumentary (Hair, Skin) Wound #1 status is Open. Original cause of wound was Surgical Injury. The date acquired was: 06/11/2020. The wound is located on the Right Toe Second. The wound measures 0.5cm length x 0.5cm width  x 0.4cm depth; 0.196cm^2 area and 0.079cm^3 volume. There is bone, tendon, and Fat Layer (Subcutaneous Tissue) exposed. There is no tunneling or undermining noted. There is a medium amount of serosanguineous drainage noted. There is no granulation within the wound bed. There is a large (67-100%) amount of necrotic tissue within the wound bed including Adherent Slough. 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  There was a Chemical/Enzymatic/Mechanical debridement performed by Tommie Sams., PA-C. With the following instrument(s): gauze. Other agent used was saline gauze. A time out was conducted at 14:10, prior to the start of the procedure. There was no bleeding. The procedure was tolerated well. Post Debridement Measurements: 0.5cm length x 0.5cm width x 0.4cm depth; 0.079cm^3 volume. Character of Wound/Ulcer Post Debridement is stable. Post procedure Diagnosis Wound #1: Same as Pre-Procedure Plan Follow-up Appointments: Return Appointment in 1 week. Bathing/ Shower/ Hygiene: May shower; gently cleanse wound with antibacterial soap, rinse and pat dry prior to dressing wounds Medications-Please add to medication list.: P.O. Antibiotics - Start doxycycline Radiology ordered were: MRI without Contrast - Right foot, special focus on 2nd toe The following medication(s) was prescribed: doxycycline hyclate oral 100 mg capsule 1 1 capsule oral taken 2 times per day for 14 days starting 01/20/2021 WOUND #1: - Toe Second Wound Laterality: Right Cleanser: Byram Ancillary Kit - 15 Day Supply (DME) (Generic) Every Other Day/30 Days Discharge Instructions: Use supplies as instructed; Kit contains: (15) Saline Bullets; (15) 3x3 Gauze; 15 pr Gloves Cleanser: Normal Saline Every Other Day/30 Days Discharge Instructions: Wash your hands with soap and water. Remove old dressing, discard into plastic bag and place into trash. Cleanse the wound with Normal Saline prior to applying a clean dressing using gauze sponges, not tissues or cotton balls. Do not scrub or use excessive force. Pat dry using gauze sponges, not tissue or cotton balls. Primary Dressing: Prisma 4.34 (in) Every Other Day/30 Days Discharge Instructions: Moisten w/normal saline or sterile water; Cover wound as directed. Do not remove from wound bed. Secondary Dressing: Conforming Guaze Roll-Small (DME) (Generic) Every Other Day/30 Days Discharge  Instructions: [place] as directed Secondary Dressing: Gauze Every Other Day/30 Days Discharge Instructions: Apply over prisma to cover Secured With: 40M Medipore H Soft Cloth Surgical Tape, 2x2 (in/yd) (DME) (Generic) Every Other Day/30 Days Discharge Instructions: Secure conform 1. Based on what I am seeing currently I would go ahead and place the patient on doxycycline for another 2 weeks while we get the MRI and again the MRI scan of the of the foot looking for osteomyelitis of the toe and ensure that nothing has spread beyond the toe region. He is in agreement with this plan. 2. I am also can recommend that we go ahead and initiate treatment with a silver collagen dressing. Again I explained to the patient with there being direct bone exposure in the base of the wound this could be difficult to get this to heal no matter what and it may even become necessary for [place] to consider hyperbaric oxygen therapy especially if there is signs of a osteomyelitis in the bone at this point. Obviously if he  does turn up to show signs of osteo and the MRI findings then the options will be either hyperbarics and oral antibiotics or amputation we will discuss that in greater detail when it comes down to it depending on the extent or lack of damage. He is in agreement with that plan. We will see patient back for reevaluation in 1 week here in the clinic. If anything worsens or changes patient will contact our office for additional recommendations. Electronic Signature(s) Signed: 01/20/2021 5:53:41 PM By: Worthy Keeler PA-C Entered By: Worthy Keeler on 01/20/2021 17:53:41 Marinos, Angus Seller ([id number]) -------------------------------------------------------------------------------- ROS/PFSH Details Patient Name: Alex Boyle Date of Service: 01/20/2021 12:45 PM Medical Record Number: [id number] Patient Account Number: [id number] Date of Birth/Sex: 08-04-1945 (75 y.o.  Male) Treating RN: Dolan Amen Primary Care Provider: Abelino Derrick Other Clinician: Referring Provider: Referral, Self Treating Provider/Extender: Skipper Cliche in Treatment: 0 Information Obtained From Patient Constitutional Symptoms (General Health) Complaints and Symptoms: Negative for: Fatigue; Fever; Chills; Marked Weight Change Eyes Complaints and Symptoms: Positive for: Glasses / Contacts Negative for: Dry Eyes; Vision Changes Ear/Nose/Mouth/Throat Complaints and Symptoms: Negative for: Difficult clearing ears; Sinusitis Hematologic/Lymphatic Complaints and Symptoms: Negative for: Bleeding / Clotting Disorders; Human Immunodeficiency Virus Respiratory Complaints and Symptoms: Negative for: Chronic or frequent coughs; Shortness of Breath Medical History: Positive for: Asthma; Sleep Apnea Cardiovascular Complaints and Symptoms: Negative for: Chest pain; LE edema Medical History: Positive for: Hypertension; Myocardial Infarction Gastrointestinal Complaints and Symptoms: Negative for: Frequent diarrhea; Nausea; Vomiting Endocrine Complaints and Symptoms: Negative for: Hepatitis; Thyroid disease; Polydypsia (Excessive Thirst) Medical History: Negative for: Type I Diabetes; Type II Diabetes Genitourinary Complaints and Symptoms: Negative for: Kidney failure/ Dialysis; Incontinence/dribbling Medical History: JERMAIN, CURT ([id number]) Negative for: End Stage Renal Disease Immunological Complaints and Symptoms: Negative for: Hives; Itching Integumentary (Skin) Complaints and Symptoms: Positive for: Wounds Negative for: Bleeding or bruising tendency; Breakdown; Swelling Musculoskeletal Complaints and Symptoms: Negative for: Muscle Pain; Muscle Weakness Medical History: Negative for: Gout; Rheumatoid Arthritis; Osteoarthritis; Osteomyelitis Neurologic Complaints and Symptoms: Negative for: Numbness/parasthesias; Focal/Weakness Medical  History: Positive for: Neuropathy Negative for: Seizure Disorder Psychiatric Complaints and Symptoms: Negative for: Anxiety; Claustrophobia Oncologic Immunizations Pneumococcal Vaccine: Received Pneumococcal Vaccination: No Implantable Devices None Family and Social History Never smoker; Marital Status - Married; Alcohol Use: Rarely; Drug Use: No History; Caffeine Use: Never Notes Hardware in ABD, metal stents in heart Electronic Signature(s) Signed: 01/20/2021 5:03:01 PM By: Dolan Amen RN Signed: 01/20/2021 5:54:54 PM By: Worthy Keeler PA-C Entered By: Dolan Amen on 01/20/2021 13:40:51 Alex Boyle ([id number]) -------------------------------------------------------------------------------- SuperBill Details Patient Name: Alex Boyle Date of Service: 01/20/2021 Medical Record Number: [id number] Patient Account Number: [id number] Date of Birth/Sex: 08-12-1945 (75 y.o. 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 I10 Essential (primary) hypertension I25.10 Atherosclerotic heart disease of native coronary artery without angina pectoris Facility Procedures CPT4 Code: [id number] Description: 99213 - WOUND CARE VISIT-LEV 3 EST PT Modifier: Quantity: 1 Physician Procedures CPT4 Code Description: [id number] - WC PHYS LEVEL 4 - NEW PT Modifier: Quantity: 1 CPT4 Code Description: ICD-10 Diagnosis Description T81.31XA Disruption of external operation (surgical) wound, not elsewhere classified L97.516 Non-pressure chronic ulcer of other  part of right foot with bone  involvemen I10 Essential (primary)  hypertension I25.10 Atherosclerotic heart disease of native coronary artery without angina pect Modifier: , initial encounter t without evidence oris Quantity: of necrosis Electronic Signature(s) Signed: 01/20/2021 5:53:59 PM By: Worthy Keeler PA-C Previous Signature: 01/20/2021 5:03:01 PM Version By: Dolan Amen RN Entered By: Worthy Keeler on 01/20/2021 17:53:59

## 2021-01-20 NOTE — Progress Notes (Signed)
ADHVAITH, THOUSAND ([id number]) Visit Report for 01/20/2021 Abuse/Suicide Risk Screen Details Patient Name: Alex Boyle, Alex Boyle. Date of Service: 01/20/2021 12:45 PM Medical Record Number: [id number] Patient Account Number: [id number] Date of Birth/Sex: 11-10-1945 (75 y.o. Male) Treating RN: Dolan Amen Primary Care Lorraine Terriquez: Abelino Derrick Other Clinician: Referring Yvett Rossel: Referral, Self Treating Mikal Wisman/Extender: Skipper Cliche in Treatment: 0 Abuse/Suicide Risk Screen Items Answer ABUSE RISK SCREEN: Has anyone close to you tried to hurt or harm you recentlyo No Do you feel uncomfortable with anyone in your familyo No Has anyone forced you do things that you didnot want to doo No Electronic Signature(s) Signed: 01/20/2021 5:03:01 PM By: Dolan Amen RN Entered By: Dolan Amen on 01/20/2021 13:26:47 Alex Boyle ([id number]) -------------------------------------------------------------------------------- Activities of Daily Living Details Patient Name: Alex Boyle Date of Service: 01/20/2021 12:45 PM Medical Record Number: [id number] Patient Account Number: [id number] Date of Birth/Sex: Nov 04, 1945 (75 y.o. Male) Treating RN: Dolan Amen Primary Care Jonavon Trieu: Abelino Derrick Other Clinician: Referring Cyrilla Durkin: Referral, Self Treating Jerrell Mangel/Extender: Skipper Cliche in Treatment: 0 Activities of Daily Living Items Answer Activities of Daily Living (Please select one for each item) Drive Automobile Completely Able Take Medications Completely Able Use Telephone Completely Able Care for Appearance Completely Able Use Toilet Completely Able Bath / Shower Completely Able Dress Self Completely Able Feed Self Completely Able Walk Completely Able Get In / Out Bed Completely Able Housework Completely Able Prepare Meals Completely [place] for Self Completely Able Electronic Signature(s) Signed: 01/20/2021 5:03:01 PM By: Dolan Amen RN Entered By: Dolan Amen on 01/20/2021 13:27:02 Alex Boyle ([id number]) -------------------------------------------------------------------------------- Education Screening Details Patient Name: Alex Boyle Date of Service: 01/20/2021 12:45 PM Medical Record Number: [id number] Patient Account Number: [id number] Date of Birth/Sex: February 13, 1946 (75 y.o. Male) Treating RN: Dolan Amen Primary Care Shandi Godfrey: Abelino Derrick Other Clinician: Referring Ruthmary Occhipinti: Referral, Self Treating Aftan Vint/Extender: Skipper Cliche in Treatment: 0 Primary Learner Assessed: Patient Learning Preferences/Education Level/Primary Language Learning Preference: Explanation, Demonstration Highest Education Level: College or Above Preferred Language: English Cognitive Barrier Language Barrier: No Translator Needed: No Memory Deficit: No Emotional Barrier: No Cultural/Religious Beliefs Affecting Medical Care: No Physical Barrier Impaired Vision: Yes Glasses Impaired Hearing: No Decreased Hand dexterity: No Knowledge/Comprehension Knowledge Level: Medium Comprehension Level: Medium Ability to understand written instructions: Medium Ability to understand verbal instructions: Medium Motivation Anxiety Level: Calm Cooperation: Cooperative Education Importance: Acknowledges Need Interest in Health Problems: Asks Questions Perception: Coherent Willingness to Engage in Self-Management Medium Activities: Readiness to Engage in Self-Management Medium Activities: Electronic Signature(s) Signed: 01/20/2021 5:03:01 PM By: Dolan Amen RN Entered By: Dolan Amen on 01/20/2021 13:28:11 Alex Boyle ([id number]) -------------------------------------------------------------------------------- Fall Risk Assessment Details Patient Name: Alex Boyle Date of Service: 01/20/2021 12:45 PM Medical Record Number: [id number] Patient Account Number: [id number] Date of Birth/Sex:  28-Oct-1945 (75 y.o. Male) Treating RN: Dolan Amen Primary Care Refugio Mcconico: Abelino Derrick Other Clinician: Referring Stephnie Parlier: Referral, Self Treating Khaleb Broz/Extender: Skipper Cliche in Treatment: 0 Fall Risk Assessment Items Have you had 2 or more falls in the last 12 monthso 0 No Have you had any fall that resulted in injury in the last 12 monthso 0 No FALLS RISK SCREEN History of falling - immediate or within 3 months 0 No Secondary diagnosis (Do you have 2 or more medical diagnoseso) 15 Yes Ambulatory aid None/bed rest/wheelchair/nurse 0 Yes Crutches/cane/walker 0 No Furniture 0 No Intravenous therapy Access/Saline/Heparin Lock 0 No Gait/Transferring Normal/ bed  rest/ wheelchair 0 Yes Weak (short steps with or without shuffle, stooped but able to lift head while walking, may 0 No seek support from furniture) Impaired (short steps with shuffle, may have difficulty arising from chair, head down, impaired 0 No balance) Mental Status Oriented to own ability 0 Yes Electronic Signature(s) Signed: 01/20/2021 5:03:01 PM By: Dolan Amen RN Entered By: Dolan Amen on 01/20/2021 13:28:21 Alex Boyle ([id number]) -------------------------------------------------------------------------------- Foot Assessment Details Patient Name: Alex Boyle Date of Service: 01/20/2021 12:45 PM Medical Record Number: [id number] Patient Account Number: [id number] Date of Birth/Sex: 09/03/1945 (75 y.o. Male) Treating RN: Dolan Amen Primary Care Glenora Morocho: Abelino Derrick Other Clinician: Referring Zackeriah Kissler: Referral, Self Treating Priseis Cratty/Extender: Skipper Cliche in Treatment: 0 Foot Assessment Items Site Locations + = Sensation present, - = Sensation absent, C = Callus, U = Ulcer R = Redness, W = Warmth, M = Maceration, PU = Pre-ulcerative lesion F = Fissure, S = Swelling, D = Dryness Assessment Right: Left: Other Deformity: No No Prior Foot Ulcer: No No Prior  Amputation: No No Charcot Joint: No No Ambulatory Status: Ambulatory Without Help Gait: Steady Electronic Signature(s) Signed: 01/20/2021 5:03:01 PM By: Dolan Amen RN Entered By: Dolan Amen on 01/20/2021 13:28:37 Alex Boyle ([id number]) -------------------------------------------------------------------------------- Nutrition Risk Screening Details Patient Name: Alex Boyle Date of Service: 01/20/2021 12:45 PM Medical Record Number: [id number] Patient Account Number: [id number] Date of Birth/Sex: [birth year] (75 y.o. Male) Treating RN: Dolan Amen Primary Care Riaz Onorato: Abelino Derrick Other Clinician: Referring Dashea Mcmullan: Referral, Self Treating Travaughn Vue/Extender: Skipper Cliche in Treatment: 0 Height (in): Weight (lbs): 220 Body Mass Index (BMI): Nutrition Risk Screening Items Score Screening NUTRITION RISK SCREEN: I have an illness or condition that made me change the kind and/or amount of food I eat 0 No I eat fewer than two meals per day 0 No I eat few fruits and vegetables, or milk products 0 No I have three or more drinks of beer, liquor or wine almost every day 0 No I have tooth or mouth problems that make it hard for me to eat 0 No I don't always have enough money to buy the food I need 0 No I eat alone most of the time 0 No I take three or more different prescribed or over-the-counter drugs a day 1 Yes Without wanting to, I have lost or gained 10 pounds in the last six months 0 No I am not always physically able to shop, cook and/or feed myself 0 No Nutrition Protocols Good Risk Protocol 0 No interventions needed Moderate Risk Protocol High Risk Proctocol Risk Level: Good Risk Score: 1 Electronic Signature(s) Signed: 01/20/2021 5:03:01 PM By: Dolan Amen RN Entered By: Dolan Amen on 01/20/2021 13:28:31

## 2021-01-23 ENCOUNTER — Other Ambulatory Visit (HOSPITAL_COMMUNITY): Payer: Self-pay | Admitting: Physician Assistant

## 2021-01-23 DIAGNOSIS — L97516 Non-pressure chronic ulcer of other part of right foot with bone involvement without evidence of necrosis: Secondary | ICD-10-CM

## 2021-01-25 ENCOUNTER — Other Ambulatory Visit: Payer: Self-pay

## 2021-01-25 ENCOUNTER — Telehealth: Payer: Self-pay

## 2021-01-25 ENCOUNTER — Ambulatory Visit (HOSPITAL_COMMUNITY)
Admission: RE | Admit: 2021-01-25 | Discharge: 2021-01-25 | Disposition: A | Discharge status: Home / Self Care | Payer: Federal, State, Local not specified - PPO | Source: Ambulatory Visit | Attending: Physician Assistant | Admitting: Physician Assistant

## 2021-01-25 DIAGNOSIS — L97516 Non-pressure chronic ulcer of other part of right foot with bone involvement without evidence of necrosis: Secondary | ICD-10-CM | POA: Diagnosis not present

## 2021-01-25 DIAGNOSIS — R600 Localized edema: Secondary | ICD-10-CM | POA: Diagnosis not present

## 2021-01-25 DIAGNOSIS — M19071 Primary osteoarthritis, right ankle and foot: Secondary | ICD-10-CM | POA: Diagnosis not present

## 2021-01-25 IMAGING — MR MR FOOT*R* W/O CM
4 of 7 series · 19 of 40 positions shown · non-contrast
Comparison: Radiographs [DATE]

CLINICAL DATA: History of second metatarsal osteotomy and hammertoe
repairs of the second, third and fifth toes. Surgery was 2 weeks
ago. Persistent scab and area of drainage from the second toe.

EXAM:
MRI OF THE RIGHT FOREFOOT WITHOUT CONTRAST
TECHNIQUE: Multiplanar, multisequence MR imaging of the right foot was
performed. No intravenous contrast was administered.

[Series 3: T1 · coronal · 4.0mm · 0.27mm/px · 6 of 34 slices shown (1 of 3)]
[im 1/34]
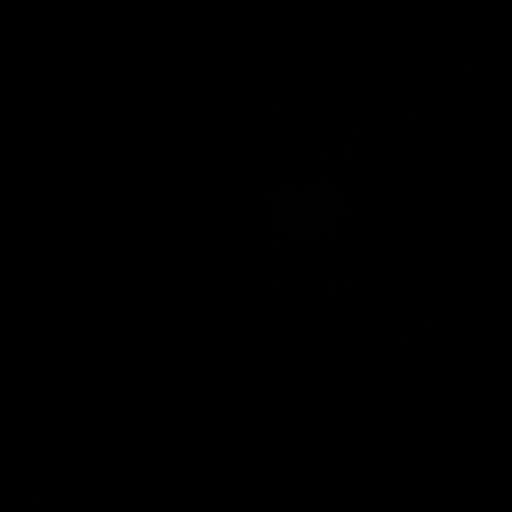
[im 5/34]
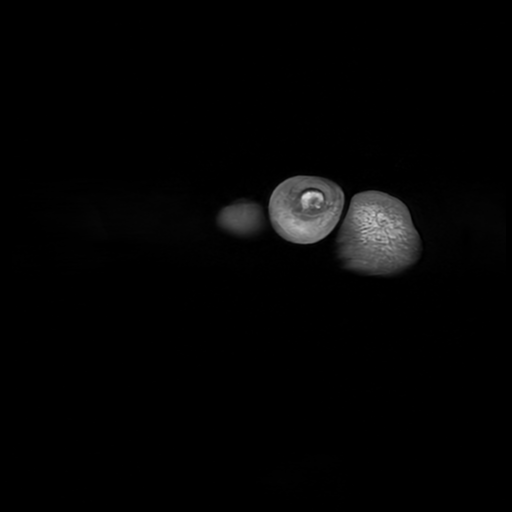
[im 10/34]
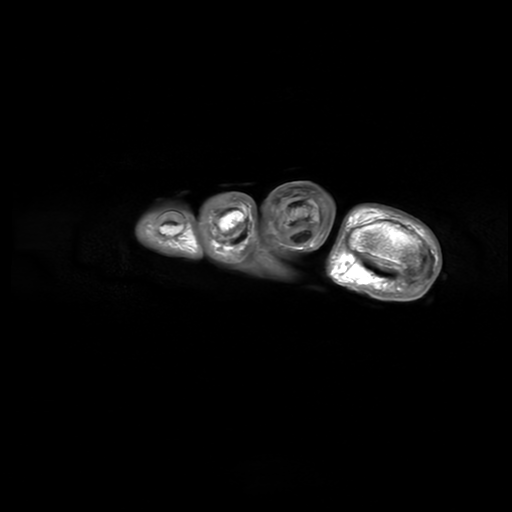
[im 15/34]
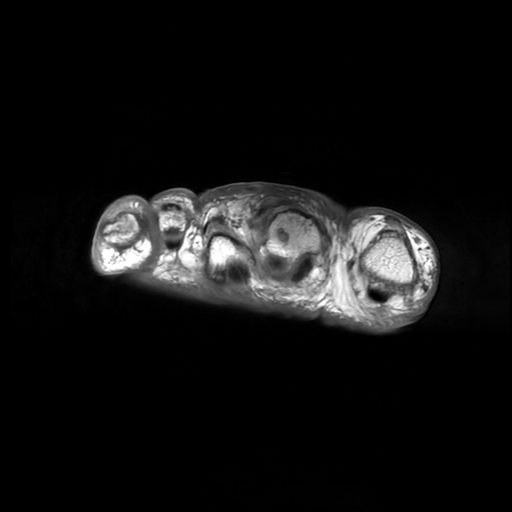
[im 19/34]
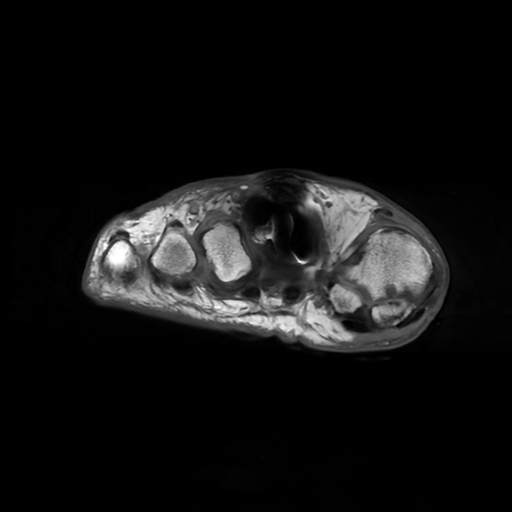
[im 29/34]
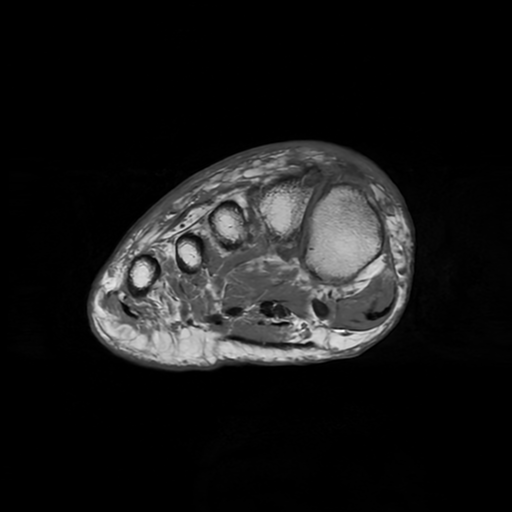

[Series 4: T2 fat-sat · coronal · 4.0mm · 0.27mm/px · 8 of 34 slices shown]
[im 1/34]
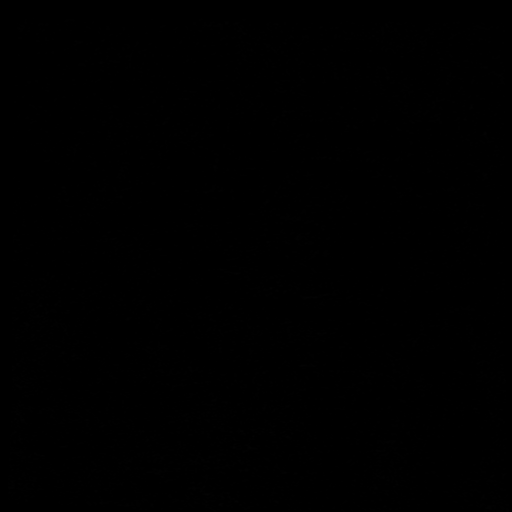
[im 5/34]
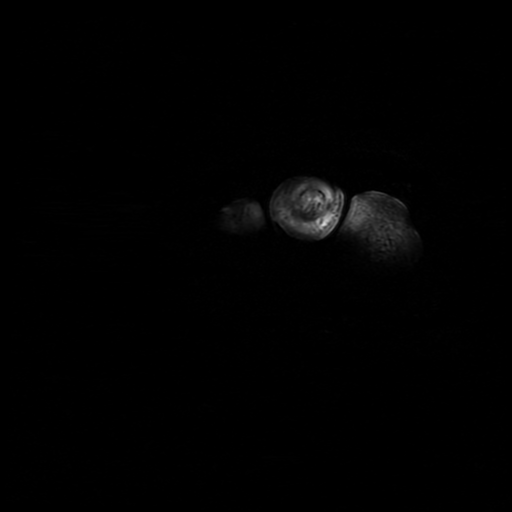
[im 10/34]
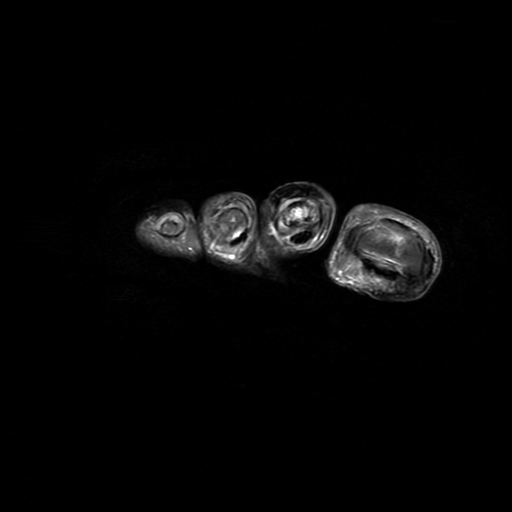
[im 15/34]
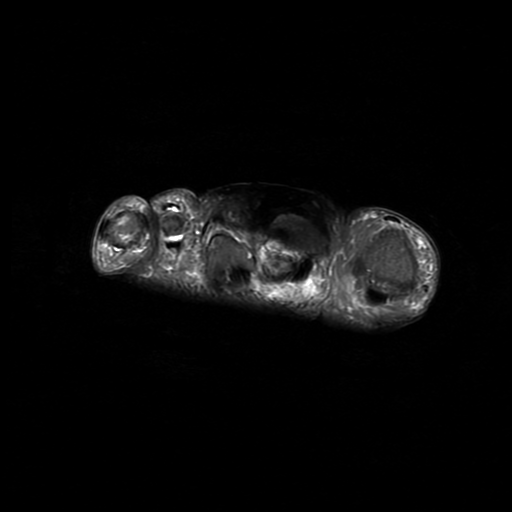
[im 19/34]
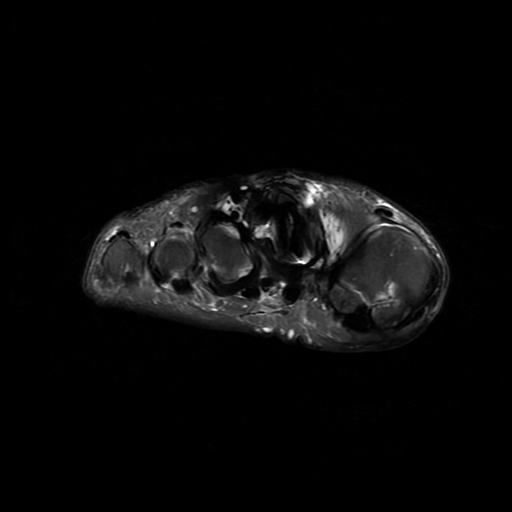
[im 24/34]
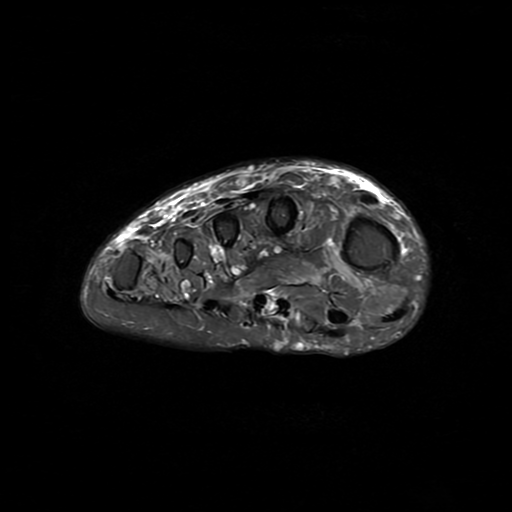
[im 29/34]
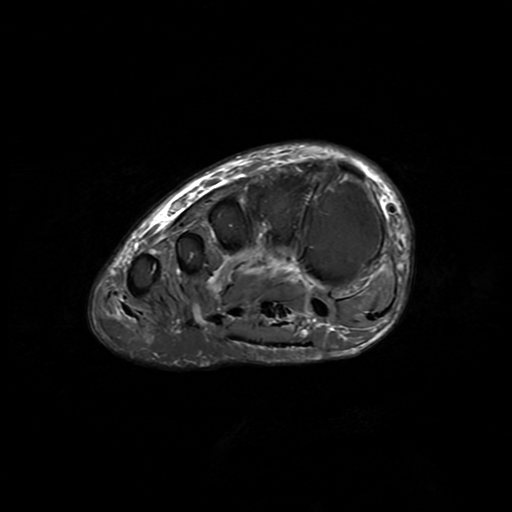
[im 34/34]
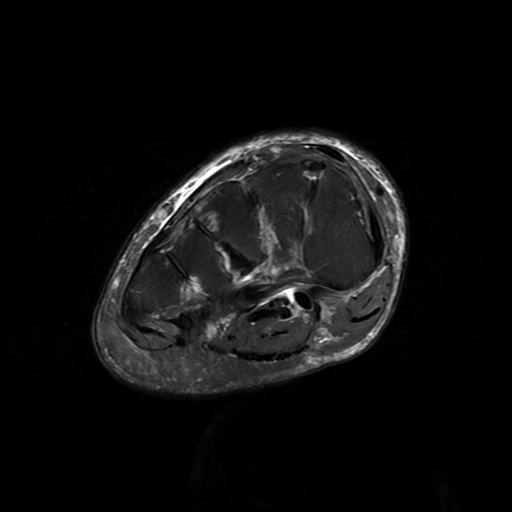

[Series 6: T1 · axial · 3.0mm · 0.27mm/px · z∈[-148,-90]mm · 2 of 10 slices shown (2 of 3)]
[im 1/10]
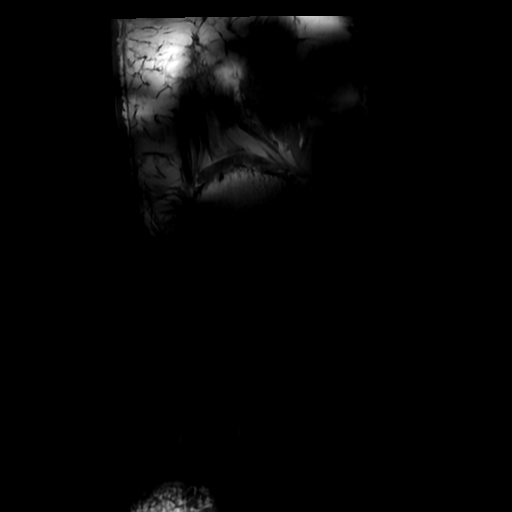
[im 10/10]
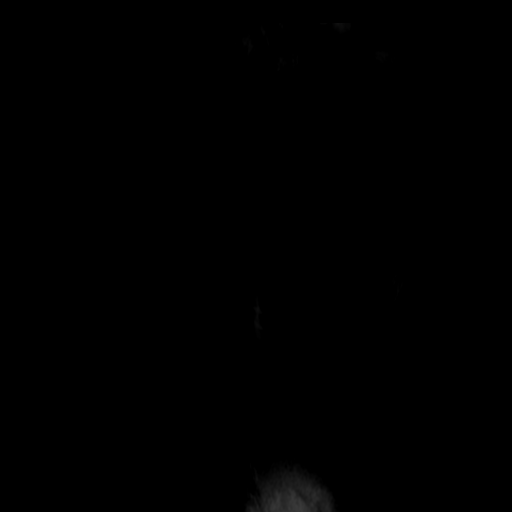

[Series 8: T1 · axial · 3.0mm · 0.27mm/px · z∈[-148,-90]mm · 3 of 19 slices shown (3 of 3)]
[im 1/19]
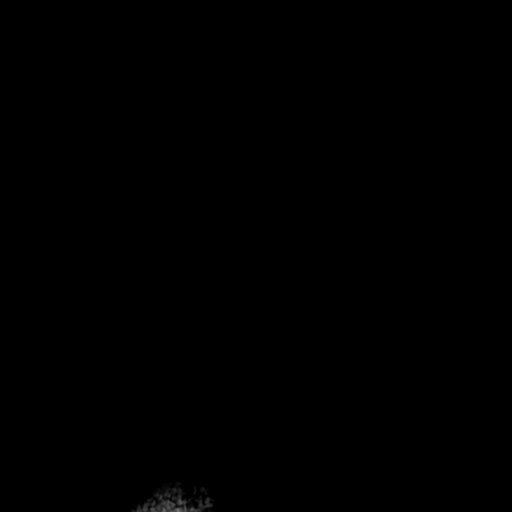
[im 10/19]
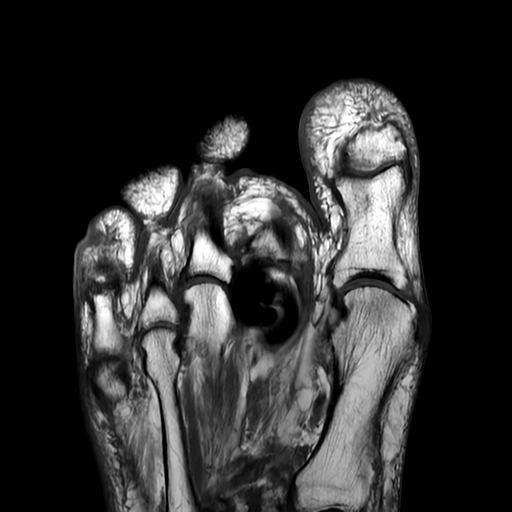
[im 19/19]
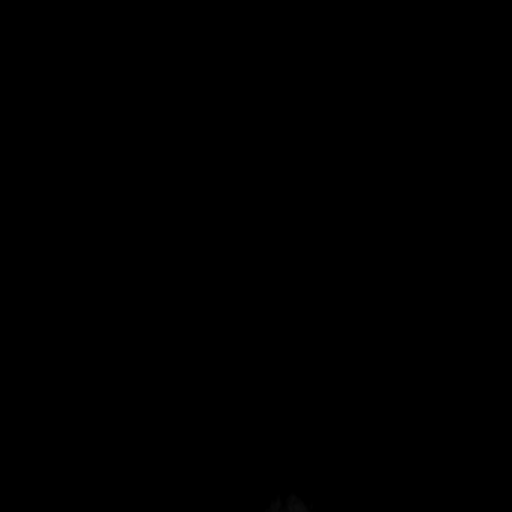

[19 of 40 positions shown; findings below may reference images not displayed]

FINDINGS: Significant artifact noted in the region of the second metatarsal
head likely metal artifact.

Abnormal T1 and T2 signal intensity in the proximal and middle
phalanges of the second toe could be due to recent surgery. Could
not exclude the possibility of osteomyelitis particularly with
history of draining ulcer.

Mild edema like signal changes noted in the distal phalanx of the
fifth toe also but no surrounding inflammatory changes.

The other bony structures are unremarkable.

The flexor and extensor tendons are intact. The foot musculature is
grossly normal.

Moderate midfoot degenerative changes are noted the sagittal
sequence.
IMPRESSION: 1. Abnormal T1 and T2 signal intensity in the proximal and middle
phalanges of the second toe could be due to recent surgery/stress
reaction. Could not exclude the possibility of osteomyelitis
particularly with history of draining ulcer.
2. Mild edema like signal changes in the distal phalanx of the fifth
toe also but no surrounding inflammatory changes.
3. Moderate midfoot degenerative changes.

## 2021-01-25 NOTE — Telephone Encounter (Signed)
Amarante called to cancel his surgery with Dr. Sherryle Lis on 02/03/2021. He stated he saw a wound specialist and they think the infection is in the bone. They have ordered an MRI and he is waiting on those results. Deborah stated he will let Dr. Sherryle Lis know once he gets the results from the MRI. I notified Dr. Sherryle Lis and Caren Griffins with [facility].

## 2021-01-25 NOTE — Telephone Encounter (Signed)
It looks like the MRI was completed today, report should be finished within 24 hours. I'm happy to see him next week (or Dr Milinda Pointer if he prefers, I had seen him as a second opinion / consult) to review it and see what our options are if he'd like

## 2021-01-26 ENCOUNTER — Encounter: Payer: Federal, State, Local not specified - PPO | Admitting: Podiatry

## 2021-01-27 ENCOUNTER — Encounter: Payer: Self-pay | Admitting: Gastroenterology

## 2021-01-27 ENCOUNTER — Other Ambulatory Visit: Payer: Self-pay

## 2021-01-27 ENCOUNTER — Ambulatory Visit (AMBULATORY_SURGERY_CENTER): Payer: Federal, State, Local not specified - PPO | Admitting: Gastroenterology

## 2021-01-27 VITALS — BP 114/70 | HR 62 | Temp 97.5°F | Resp 11 | Ht 71.0 in | Wt 224.0 lb

## 2021-01-27 DIAGNOSIS — K319 Disease of stomach and duodenum, unspecified: Secondary | ICD-10-CM | POA: Diagnosis not present

## 2021-01-27 DIAGNOSIS — D122 Benign neoplasm of ascending colon: Secondary | ICD-10-CM

## 2021-01-27 DIAGNOSIS — K2970 Gastritis, unspecified, without bleeding: Secondary | ICD-10-CM

## 2021-01-27 DIAGNOSIS — Z9889 Other specified postprocedural states: Secondary | ICD-10-CM | POA: Diagnosis not present

## 2021-01-27 DIAGNOSIS — K2270 Barrett's esophagus without dysplasia: Secondary | ICD-10-CM

## 2021-01-27 DIAGNOSIS — D123 Benign neoplasm of transverse colon: Secondary | ICD-10-CM | POA: Diagnosis not present

## 2021-01-27 DIAGNOSIS — Z86010 Personal history of colonic polyps: Secondary | ICD-10-CM

## 2021-01-27 DIAGNOSIS — K219 Gastro-esophageal reflux disease without esophagitis: Secondary | ICD-10-CM | POA: Diagnosis not present

## 2021-01-27 DIAGNOSIS — K5730 Diverticulosis of large intestine without perforation or abscess without bleeding: Secondary | ICD-10-CM

## 2021-01-27 DIAGNOSIS — D124 Benign neoplasm of descending colon: Secondary | ICD-10-CM | POA: Diagnosis not present

## 2021-01-27 DIAGNOSIS — K2990 Gastroduodenitis, unspecified, without bleeding: Secondary | ICD-10-CM

## 2021-01-27 MED ORDER — SODIUM CHLORIDE 0.9 % IV SOLN: 500.0000 mL | Freq: Once | INTRAVENOUS | Status: DC

## 2021-01-27 NOTE — Progress Notes (Signed)
To PACU, VSS. Report to Rn.tb 

## 2021-01-27 NOTE — Op Note (Signed)
[facility] Patient Name: Alex Boyle Procedure Date: 01/27/2021 1:17 PM MRN: [id number] Endoscopist: Gerrit Heck , MD Age: 75 Referring MD:  Date of Birth: 07-06-1945 Gender: Male Account #: [id number] Procedure:                Upper GI endoscopy Indications:              Esophageal reflux, Follow-up of Barrett's esophagus Medicines:                Monitored Anesthesia Care Procedure:                Pre-Anesthesia Assessment:                           - Prior to the procedure, a History and Physical                            was performed, and patient medications and                            allergies were reviewed. The patient's tolerance of                            previous anesthesia was also reviewed. The risks                            and benefits of the procedure and the sedation                            options and risks were discussed with the patient.                            All questions were answered, and informed consent                            was obtained. Prior Anticoagulants: The patient has                            taken no previous anticoagulant or antiplatelet                            agents. ASA Grade Assessment: II - A patient with                            mild systemic disease. After reviewing the risks                            and benefits, the patient was deemed in                            satisfactory condition to undergo the procedure.                           After obtaining informed consent, the endoscope was  passed under direct vision. Throughout the                            procedure, the patient's blood pressure, pulse, and                            oxygen saturations were monitored continuously. The                            Endoscope was introduced through the mouth, and                            advanced to the second part of duodenum. The upper                            GI  endoscopy was accomplished without difficulty.                            The patient tolerated the procedure well. Scope In: Scope Out: Findings:                 There were esophageal mucosal changes classified as                            Barrett's stage C1-M2 per Prague criteria present                            in the lower third of the esophagus. The maximum                            longitudinal extent of these mucosal changes was 2                            cm in length (2 tongues of salmon colored mucosa                            and 1 island of salmon colored mucosa). Mucosa was                            biopsied with a cold forceps for histology in 4                            quadrants. Estimated blood loss was minimal.                           The upper third of the esophagus and middle third                            of the esophagus were normal.                           Evidence of a Nissen fundoplication was found in  the cardia. The wrap appeared loose with LES laxity.                           Mild inflammation characterized by erythema was                            found in the gastric body and in the gastric                            antrum. Biopsies were taken with a cold forceps for                            Helicobacter pylori testing. Estimated blood loss                            was minimal.                           The examined duodenum was normal. Complications:            No immediate complications. Estimated Blood Loss:     Estimated blood loss was minimal. Impression:               - Esophageal mucosal changes classified as                            Barrett's stage C1-M2 per Prague criteria. Biopsied.                           - Normal upper third of esophagus and middle third                            of esophagus.                           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 The wrap                             appears loose.                           - Gastritis. Biopsied.                           - Normal examined duodenum. Recommendation:           - Patient has a contact number available for                            emergencies. The signs and symptoms of potential                            delayed complications were discussed with the                            patient. Return to normal activities tomorrow.  Written discharge instructions were provided to the                            patient.                           - Resume previous diet.                           - Continue present medications.                           - Await pathology results.                           - Repeat upper endoscopy for surveillance based on                            pathology results. Gerrit Heck, MD 01/27/2021 2:13:06 PM

## 2021-01-27 NOTE — Patient Instructions (Signed)
Handouts given for Gastritis, Polyps and Diverticulosis.  YOU HAD AN ENDOSCOPIC PROCEDURE TODAY AT [facility]:   Refer to the procedure report that was given to you for any specific questions about what was found during the examination.  If the procedure report does not answer your questions, please call your gastroenterologist to clarify.  If you requested that your care partner not be given the details of your procedure findings, then the procedure report has been included in a sealed envelope for you to review at your convenience later.  YOU SHOULD EXPECT: Some feelings of bloating in the abdomen. Passage of more gas than usual.  Walking can help get rid of the air that was put into your GI tract during the procedure and reduce the bloating. If you had a lower endoscopy (such as a colonoscopy or flexible sigmoidoscopy) you may notice spotting of blood in your stool or on the toilet paper. If you underwent a bowel prep for your procedure, you may not have a normal bowel movement for a few days.  Please Note:  You might notice some irritation and congestion in your nose or some drainage.  This is from the oxygen used during your procedure.  There is no need for concern and it should clear up in a day or so.  SYMPTOMS TO REPORT IMMEDIATELY:  Following lower endoscopy (colonoscopy or flexible sigmoidoscopy):  Excessive amounts of blood in the stool  Significant tenderness or worsening of abdominal pains  Swelling of the abdomen that is new, acute  Fever of 100F or higher  Following upper endoscopy (EGD)  Vomiting of blood or coffee ground material  New chest pain or pain under the shoulder blades  Painful or persistently difficult swallowing  New shortness of breath  Black, tarry-looking stools  For urgent or emergent issues, a gastroenterologist can be reached at any hour by calling [phone]. Do not use MyChart messaging for urgent concerns.    DIET:  We do  recommend a small meal at first, but then you may proceed to your regular diet.  Drink plenty of fluids but you should avoid alcoholic beverages for 24 hours.  ACTIVITY:  You should plan to take it easy for the rest of today and you should NOT DRIVE or use heavy machinery until tomorrow (because of the sedation medicines used during the test).    FOLLOW UP: Our staff will call the number listed on your records 48-72 hours following your procedure to check on you and address any questions or concerns that you may have regarding the information given to you following your procedure. If we do not reach you, we will leave a message.  We will attempt to reach you two times.  During this call, we will ask if you have developed any symptoms of COVID 19. If you develop any symptoms (ie: fever, flu-like symptoms, shortness of breath, cough etc.) before then, please call [phone].  If you test positive for Covid 19 in the 2 weeks post procedure, please call and report this information to [place].    If any biopsies were taken you will be contacted by phone or by letter within the next 1-3 weeks.  Please call us at [phone] if you have not heard about the biopsies in 3 weeks.    SIGNATURES/CONFIDENTIALITY: You and/or your care partner have signed paperwork which will be entered into your electronic medical record.  These signatures attest to the fact that that the information above on  your After Visit Summary has been reviewed and is understood.  Full responsibility of the confidentiality of this discharge information lies with you and/or your care-partner.

## 2021-01-27 NOTE — Progress Notes (Signed)
Called to room to assist during endoscopic procedure.  Patient ID and intended procedure confirmed with present staff. Received instructions for my participation in the procedure from the performing physician.  

## 2021-01-27 NOTE — Op Note (Signed)
[facility] Patient Name: Alex Boyle Procedure Date: 01/27/2021 1:17 PM MRN: [id number] Endoscopist: Gerrit Heck , MD Age: 75 Referring MD:  Date of Birth: [birth year] Gender: Male Account #: [id number] Procedure:                Colonoscopy Indications:              High risk colon cancer surveillance: Personal                            history of colonic polyps Medicines:                Monitored Anesthesia Care Procedure:                Pre-Anesthesia Assessment:                           - Prior to the procedure, a History and Physical                            was performed, and patient medications and                            allergies were reviewed. The patient's tolerance of                            previous anesthesia was also reviewed. The risks                            and benefits of the procedure and the sedation                            options and risks were discussed with the patient.                            All questions were answered, and informed consent                            was obtained. Prior Anticoagulants: The patient has                            taken no previous anticoagulant or antiplatelet                            agents. ASA Grade Assessment: II - A patient with                            mild systemic disease. After reviewing the risks                            and benefits, the patient was deemed in                            satisfactory condition to undergo the procedure.  After obtaining informed consent, the colonoscope                            was passed under direct vision. Throughout the                            procedure, the patient's blood pressure, pulse, and                            oxygen saturations were monitored continuously. The                            CF [id number] was introduced through the anus                            and advanced to the the cecum,  identified by                            appendiceal orifice and ileocecal valve. The                            colonoscopy was performed without difficulty. The                            patient tolerated the procedure well. The quality                            of the bowel preparation was good. The ileocecal                            valve, appendiceal orifice, and rectum were                            photographed. Scope In: 1:45:35 PM Scope Out: 2:05:34 PM Scope Withdrawal Time: 0 hours 16 minutes 57 seconds  Total Procedure Duration: 0 hours 19 minutes 59 seconds  Findings:                 The perianal and digital rectal examinations were                            normal.                           Four sessile polyps were found in the ascending                            colon. The polyps were 2 to 5 mm in size. These                            polyps were removed with a cold snare. Resection                            and retrieval were complete. Estimated blood loss  was minimal.                           A 12 mm polyp was found in the transverse colon.                            The polyp was sessile. The polyp was removed with a                            cold snare. Resection and retrieval were complete.                            Estimated blood loss was minimal.                           A 3 mm polyp was found in the descending colon. The                            polyp was sessile. The polyp was removed with a                            cold snare. Resection and retrieval were complete.                            Estimated blood loss was minimal.                           Multiple small and large-mouthed diverticula were                            found in the entire colon.                           There was evidence of a prior end-to-side                            colo-colonic anastomosis in the rectum. This was                             patent and was characterized by healthy appearing                            mucosa. The anastomosis was traversed.                           Retroflexion in the rectum was not performed due to                            post-surgical anatomy and narrowed rectal vault. Complications:            No immediate complications. Estimated Blood Loss:     Estimated blood loss was minimal. Impression:               - Four 2 to 5 mm polyps in the ascending colon,  removed with a cold snare. Resected and retrieved.                           - One 12 mm polyp in the transverse colon, removed                            with a cold snare. Resected and retrieved.                           - One 3 mm polyp in the descending colon, removed                            with a cold snare. Resected and retrieved.                           - Diverticulosis in the entire examined colon.                           - Patent end-to-side colo-colonic anastomosis,                            characterized by healthy appearing mucosa. Recommendation:           - Patient has a contact number available for                            emergencies. The signs and symptoms of potential                            delayed complications were discussed with the                            patient. Return to normal activities tomorrow.                            Written discharge instructions were provided to the                            patient.                           - Resume previous diet.                           - Continue present medications.                           - Await pathology results.                           - Repeat colonoscopy in 3 years for surveillance,                            or sooner based on pathology results.                           -  Return to GI office PRN. Gerrit Heck, MD 01/27/2021 2:19:04 PM

## 2021-01-27 NOTE — Progress Notes (Signed)
GASTROENTEROLOGY PROCEDURE H&P NOTE   Primary Care Physician: Libby Maw, MD    Reason for Procedure:   GERD, Barrett's esophagus, colon cancer screening, colon polyps  Plan:    EGD, colonoscopy  Patient is appropriate for endoscopic procedure(s) in the ambulatory ([facility]) setting.  The nature of the procedure, as well as the risks, benefits, and alternatives were carefully and thoroughly reviewed with the patient. Ample time for discussion and questions allowed. The patient understood, was satisfied, and agreed to proceed.     HPI: Alex Boyle is a 75 y.o. male who presents for EGD and colonoscopy for evaluation of GERD, Barrett's esophagus, and polyp surveillance/colon cancer screening.  Patient was last seen by me on 01/02/2021 in the GI clinic.  Please refer to note dated that day for further details.  Past Medical History:  Diagnosis Date   Allergy    Arthritis    Asthma    COPD (chronic obstructive pulmonary disease) ([place])    boderline per pt   Diverticulitis    Hyperlipidemia    Hypertension    Myocardial infarction ([place])    Sleep apnea    uses bipap   Sleep apnea in adult     Past Surgical History:  Procedure Laterality Date   COLON SURGERY     COLONOSCOPY     CORONARY STENT PLACEMENT     x2   FOOT SURGERY     JOINT REPLACEMENT     knee   LAPAROSCOPIC TRANSABDOMINAL HERNIA     MANDIBLE SURGERY     skin cancer     basil cell removed on back   UPPER GASTROINTESTINAL ENDOSCOPY      Prior to Admission medications   Medication Sig Start Date End Date Taking? Authorizing Provider  B Complex Vitamins (VITAMIN-B COMPLEX PO) Take by mouth.   Yes [provider]  betamethasone valerate (VALISONE) 0.1 % cream SMARTSIG:1 Application Topical 1-2 Times Daily PRN 12/07/20  Yes [provider]  BREO ELLIPTA 100-25 MCG/INH AEPB INHALE ONE PUFF BY MOUTH ONE TIME DAILY 01/16/21  Yes Luetta Nutting, DO  Cholecalciferol 25 MCG (1000  UT) capsule Take by mouth.   Yes [provider]  doxycycline (VIBRAMYCIN) 100 MG capsule Take 100 mg by mouth 2 (two) times daily. 01/20/21  Yes [provider]  finasteride (PROSCAR) 5 MG tablet Take 1qd (Plz sched appt with new provider for future fills) 09/23/19  Yes Libby Maw, MD  lisinopril (ZESTRIL) 5 MG tablet Take by mouth. 09/08/20  Yes [provider]  metoprolol tartrate (LOPRESSOR) 25 MG tablet TAKE ONE TABLET BY MOUTH TWICE A DAY 03/13/20  Yes Libby Maw, MD  montelukast (SINGULAIR) 10 MG tablet Take by mouth.   Yes [provider]  Multiple Vitamins-Minerals (MULTIVITAMIN ADULTS 50+ PO) Take by mouth.   Yes [provider]  omeprazole (PRILOSEC) 20 MG capsule Take by mouth 2 (two) times daily before a meal.   Yes [provider]  rosuvastatin (CRESTOR) 40 MG tablet Take 1 tablet (40 mg total) by mouth daily. 12/01/20  Yes Libby Maw, MD  tamsulosin [facility]) 0.4 MG CAPS capsule Take 1qd (Plz sched appt with new provider for future fills) 09/23/19  Yes Libby Maw, MD  albuterol (PROVENTIL HFA;VENTOLIN HFA) 108 (90 Base) MCG/ACT inhaler Inhale 1-2 puffs into the lungs every 6 (six) hours as needed for wheezing or shortness of breath. 09/23/18   Luetta Nutting, DO  clindamycin (CLEOCIN) 150 MG capsule  Indications: treatment to prevent bacterial infection of a heart valve. Take '600mg'$  as directed 1 hour prior to dental work 10/12/20   [provider]  nitroGLYCERIN (NITROSTAT) 0.4 MG SL tablet Place under the tongue.    [provider]    Current Outpatient Medications  Medication Sig Dispense Refill   B Complex Vitamins (VITAMIN-B COMPLEX PO) Take by mouth.     betamethasone valerate (VALISONE) 0.1 % cream SMARTSIG:1 Application Topical 1-2 Times Daily PRN     BREO ELLIPTA 100-25 MCG/INH AEPB INHALE ONE PUFF BY MOUTH ONE TIME DAILY 60 each 6   Cholecalciferol 25 MCG (1000 UT)  capsule Take by mouth.     doxycycline (VIBRAMYCIN) 100 MG capsule Take 100 mg by mouth 2 (two) times daily.     finasteride (PROSCAR) 5 MG tablet Take 1qd (Plz sched appt with new provider for future fills) 90 tablet 1   lisinopril (ZESTRIL) 5 MG tablet Take by mouth.     metoprolol tartrate (LOPRESSOR) 25 MG tablet TAKE ONE TABLET BY MOUTH TWICE A DAY 180 tablet 1   montelukast (SINGULAIR) 10 MG tablet Take by mouth.     Multiple Vitamins-Minerals (MULTIVITAMIN ADULTS 50+ PO) Take by mouth.     omeprazole (PRILOSEC) 20 MG capsule Take by mouth 2 (two) times daily before a meal.     rosuvastatin (CRESTOR) 40 MG tablet Take 1 tablet (40 mg total) by mouth daily. 90 tablet 1   tamsulosin (FLOMAX) 0.4 MG CAPS capsule Take 1qd (Plz sched appt with new provider for future fills) 90 capsule 0   albuterol (PROVENTIL HFA;VENTOLIN HFA) 108 (90 Base) MCG/ACT inhaler Inhale 1-2 puffs into the lungs every 6 (six) hours as needed for wheezing or shortness of breath. 1 Inhaler 12   clindamycin (CLEOCIN) 150 MG capsule Indications: treatment to prevent bacterial infection of a heart valve. Take '600mg'$  as directed 1 hour prior to dental work     nitroGLYCERIN (NITROSTAT) 0.4 MG SL tablet Place under the tongue.     Current Facility-Administered Medications  Medication Dose Route Frequency Provider Last Rate Last Admin   0.9 %  sodium chloride infusion  500 mL Intravenous Once Jeweliana Dudgeon V, DO        Allergies as of 01/27/2021 - Review Complete 01/27/2021  Allergen Reaction Noted   Codeine Shortness Of Breath 07/02/2018   Gramineae pollens Itching 10/26/2020   Penicillins Swelling 07/02/2018    Family History  Adopted: Yes  Problem Relation Age of Onset   Uterine cancer Mother    Heart disease Mother    Hypertension Mother    Arthritis Mother    Alzheimer's disease Mother    Colon cancer Neg Hx    Pancreatic cancer Neg Hx    Esophageal cancer Neg Hx    Rectal cancer Neg Hx    Stomach  cancer Neg Hx     Social History   Socioeconomic History   Marital status: Married    Spouse name: Not on file   Number of children: 1   Years of education: Not on file   Highest education level: Not on file  Occupational History   Not on file  Tobacco Use   Smoking status: Former    Years: 2.00    Types: Cigarettes   Smokeless tobacco: Never  Vaping Use   Vaping Use: Never used  Substance and Sexual Activity   Alcohol use: Yes    Comment: soically    Drug use: Never  Sexual activity: Yes    Birth control/protection: None  Other Topics Concern   Not on file  Social History Narrative   Not on file   Social Determinants of Health   Financial Resource Strain: Not on file  Food Insecurity: Not on file  Transportation Needs: Not on file  Physical Activity: Not on file  Stress: Not on file  Social Connections: Not on file  Intimate Partner Violence: Not on file    Physical Exam: Vital signs in last 24 hours: '@BP'$  93/70   Pulse 77   Temp (!) 97.5 F (36.4 C) (Temporal)   Ht '5\' 11"'$  (1.803 m)   Wt 224 lb (101.6 kg)   SpO2 97%   BMI 31.24 kg/m  GEN: NAD EYE: Sclerae anicteric ENT: MMM CV: Non-tachycardic Pulm: CTA b/l GI: Soft, NT/ND NEURO:  Alert & Oriented x 3   Gerrit Heck, DO [facility]   01/27/2021 1:28 PM

## 2021-01-27 NOTE — Progress Notes (Signed)
VS-DT 

## 2021-01-30 ENCOUNTER — Encounter: Payer: Federal, State, Local not specified - PPO | Admitting: Physician Assistant

## 2021-01-30 ENCOUNTER — Other Ambulatory Visit: Payer: Self-pay

## 2021-01-30 DIAGNOSIS — L97516 Non-pressure chronic ulcer of other part of right foot with bone involvement without evidence of necrosis: Secondary | ICD-10-CM | POA: Diagnosis not present

## 2021-01-30 DIAGNOSIS — Z880 Allergy status to penicillin: Secondary | ICD-10-CM | POA: Diagnosis not present

## 2021-01-30 DIAGNOSIS — Z885 Allergy status to narcotic agent status: Secondary | ICD-10-CM | POA: Diagnosis not present

## 2021-01-30 DIAGNOSIS — T8131XA Disruption of external operation (surgical) wound, not elsewhere classified, initial encounter: Secondary | ICD-10-CM | POA: Diagnosis not present

## 2021-01-30 DIAGNOSIS — G629 Polyneuropathy, unspecified: Secondary | ICD-10-CM | POA: Diagnosis not present

## 2021-01-30 NOTE — Progress Notes (Addendum)
BENTURA, MCMICKEN ([id number]) Visit Report for 01/30/2021 Chief Complaint Document Details Patient Name: Alex Boyle, STANTZ. Date of Service: 01/30/2021 10:00 AM Medical Record Number: [id number] Patient Account Number: [id number] Date of Birth/Sex: Dec 15, 1945 (75 y.o. M) Treating RN: Dolan Amen Primary Care Provider: Abelino Derrick Other Clinician: Referring Provider: Abelino Derrick Treating Provider/Extender: Skipper Cliche in Treatment: 1 Information Obtained from: Patient Chief Complaint Right 2nd Toe Ulcer Electronic Signature(s) Signed: 01/30/2021 10:31:21 AM By: Worthy Keeler PA-C Entered By: Worthy Keeler on 01/30/2021 10:31:20 Alex Boyle ([id number]) -------------------------------------------------------------------------------- HPI Details Patient Name: Alex Boyle Date of Service: 01/30/2021 10:00 AM Medical Record Number: [id number] Patient Account Number: [id number] Date of Birth/Sex: [birth year] (75 y.o. M) Treating RN: Dolan Amen Primary Care Provider: Abelino Derrick Other Clinician: Referring Provider: Abelino Derrick Treating Provider/Extender: Skipper Cliche in Treatment: 1 History of Present Illness HPI Description: 01/20/2021 upon evaluation today patient appears to be doing somewhat poorly in regard to a surgery that he had that was actually on 06/10/2020. He tells me that the toe was subsequently a hammertoe and this needed to be corrected by surgical intervention. When they got and the joint was so deteriorated and ended up having to actually pin and fuse the toe. This no longer Alex Boyle has given him some trouble he tells me. With that being said he has been seeing [facility], Dr. Sherryle Lis, and the only thing that is really been mentioned is going to try to remove a portion of the toe in order to basically it sounds like refuse this. Nonetheless I really do not know that the patient is interested in going down that road at this point  based on what I am hearing. He does have noted culture on 12/22/2020 which was positive for Serratia. Nonetheless he has been on doxycycline though that just ended today. I would probably extend this for him due to the fact that I want him to be on this plenty in order to justify a good trial of antibiotics as well as wound care before potential for hyperbarics if he does indeed have osteomyelitis noted. The good news is his ABIs are on the right 1.38 with a TBI of 0.79 and on the left 1.21 with a TBI of 0.70. This was on 11/23/2020. Notably the patient is obviously very frustrated with the situation he has not had an MRI at this point since the surgery. I think that is really what we need to look into doing. Recently well8/22/2022 upon evaluation today patient appears to be doing regard to his toe ulcer. This is definitely not significantly improved compared to previous but is also not doing any worse to be honest. I did review his MRI today as well. With that being said I do not see any signs of definitive osteomyelitis currently. There was some mention of abnormal T1 and T2 signal intensity in the proximal and middle phalange ease of the second toe which could be due to recent surgery/stress reaction although osteomyelitis could not be excluded. Being that the bone at this location seems to be very solid there is no signs of flaking and in general the patient's toe does not appear to be showing signs of significant warmth with the erythema although there is some redness I think this is more just inflammatory as a result of the surgery. My gut feeling is that this does not represent osteomyelitis based on what I am seeing physically as well. The MRI also does not identify any  signs of infection anywhere else in the foot which is also good news. In general this is very reassuring in my opinion. All of this was discussed with the patient during the office visit today. Electronic Signature(s) Signed:  01/30/2021 11:13:52 AM By: Worthy Keeler PA-C Entered By: Worthy Keeler on 01/30/2021 11:13:51 Alex Boyle ([id number]) -------------------------------------------------------------------------------- Physical Exam Details Patient Name: Alex Boyle Date of Service: 01/30/2021 10:00 AM Medical Record Number: [id number] Patient Account Number: [id number] Date of Birth/Sex: 09-Jan-1946 (75 y.o. M)o. M) Treating RN: Dolan Amen Primary Care Provider: Abelino Derrick Other Clinician: Referring Provider: Abelino Derrick Treating Provider/Extender: Jeri Cos Weeks in Treatment: 1 Constitutional Well-nourished and well-hydrated in no acute distress. Respiratory normal breathing without difficulty. Psychiatric this patient is able to make decisions and demonstrates good insight into disease process. Alert and Oriented x 3. pleasant and cooperative. Notes Upon inspection patient's wound bed actually showed signs of bone exposure in the base of the wound obviously this is could be something that we will have to establish some granulation growth with first if we can see this heal. With that being said I do believe that we can we could potentially try Oasis to see if we could get some granular growth over top of the bone I believe that would be an appropriate way to go based on what I am seeing in a small as this is. And the patient is in agreement with giving this a try we will get a look into insurance approval on this currently. Electronic Signature(s) Signed: 01/30/2021 11:15:06 AM By: Worthy Keeler PA-C Entered By: Worthy Keeler on 01/30/2021 11:15:06 Alex Boyle ([id number]) -------------------------------------------------------------------------------- Physician Orders Details Patient Name: Alex Boyle Date of Service: 01/30/2021 10:00 AM Medical Record Number: [id number] Patient Account Number: [id number] Date of Birth/Sex: May 02, 1946 (75 y.o. M)o. 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 Bathing/ Shower/ Hygiene o May shower; gently cleanse wound with antibacterial soap, rinse and pat dry prior to dressing wounds Medications-Please add to medication list. o P.O. Antibiotics - Extend doxycycline Wound Treatment Wound #1 - Toe Second Wound Laterality: Right Cleanser: Normal Saline Every Other Day/30 Days Discharge Instructions: Wash your hands with soap and water. Remove old dressing, discard into plastic bag and place into trash. Cleanse the wound with Normal Saline prior to applying a clean dressing using gauze sponges, not tissues or cotton balls. Do not scrub or use excessive force. Pat dry using gauze sponges, not tissue or cotton balls. Primary Dressing: Prisma 4.34 (in) Every Other Day/30 Days Discharge Instructions: Moisten w/normal saline or sterile water; Cover wound as directed. Do not remove from wound bed. Secondary Dressing: Conforming Guaze Roll-Small (Generic) Every Other Day/30 Days Discharge Instructions: [place] as directed Secondary Dressing: Gauze Every Other Day/30 Days Discharge Instructions: Apply over prisma to cover Secured With: 55M [place] Surgical Tape, 2x2 (in/yd) (Generic) Every Other Day/30 Days Discharge Instructions: Secure conform Electronic Signature(s) Signed:  01/30/2021 4:26:23 PM By: Dolan Amen RN Signed:  01/30/2021 5:15:29 PM By: Worthy Keeler PA-C Entered By: Dolan Amen on 01/30/2021 10:44:09 Alex Boyle ([id number]) -------------------------------------------------------------------------------- Problem List Details Patient Name: Alex Boyle Date of Service: 01/30/2021 10:00 AM Medical Record Number: [id number] Patient Account Number: [id number] Date of Birth/Sex: 01-Jun-1946 (75 y.o. M) Treating RN: Dolan Amen Primary Care Provider: Abelino Derrick Other Clinician: Referring Provider: Abelino Derrick Treating Provider/Extender: Skipper Cliche in Treatment: 1 Active Problems ICD-10 Encounter Code Description Active Date MDM Diagnosis T81.31XA Disruption of external operation (surgical) wound, not elsewhere 01/20/2021 No Yes classified, initial encounter L97.516 Non-pressure chronic ulcer of other part of right foot with bone 01/20/2021 No Yes involvement without evidence of necrosis I10 Essential (primary) hypertension 01/20/2021 No Yes I25.10 Atherosclerotic heart disease of native coronary artery without angina 01/20/2021 No Yes pectoris Inactive Problems Resolved Problems Electronic Signature(s) Signed: 01/30/2021 10:31:13 AM By: Worthy Keeler PA-C Entered By: Worthy Keeler on 01/30/2021 10:31:11 Alex Boyle ([id number]) -------------------------------------------------------------------------------- Progress Note Details Patient Name: Alex Boyle Date of Service: 01/30/2021 10:00 AM Medical Record Number: [id number] Patient Account Number: [id number] Date of Birth/Sex: 05-02-1946 (75 y.o. M) Treating RN: Dolan Amen Primary Care Provider: Abelino Derrick Other Clinician: Referring Provider: Abelino Derrick Treating Provider/Extender: Skipper Cliche in Treatment: 1 Subjective Chief Complaint Information obtained from Patient Right 2nd Toe Ulcer History of Present Illness (HPI) 01/20/2021  upon evaluation today patient appears to be doing somewhat poorly in regard to a surgery that he had that was actually on 06/10/2020. He tells me that the toe was subsequently a hammertoe and this needed to be corrected by surgical intervention. When they got and the joint was so deteriorated and ended up having to actually pin and fuse the toe. This no longer Alex Boyle has given him some trouble he tells me. With that being said he has been seeing [facility], Dr. Sherryle Lis, and the only thing that is really been mentioned is going to try to remove a portion of the toe in order to basically it sounds like refuse this. Nonetheless I really do not know that the patient is interested in going down that road at this point based on what I am hearing. He does have noted culture on 12/22/2020 which was positive for Serratia. Nonetheless he has been on doxycycline though that just ended today. I would probably extend this for him due to the fact that I want him to be on this plenty in order to justify a good trial of antibiotics as well as wound care before potential for hyperbarics if he does indeed have osteomyelitis noted. The good news is his ABIs are on the right 1.38 with a TBI of 0.79 and on the left 1.21 with a TBI of 0.70. This was on 11/23/2020. Notably the patient is obviously very frustrated with the situation he has not had an MRI at this point since the surgery. I think that is really what we need to look into doing. Recently well8/22/2022 upon evaluation today patient appears to be doing regard to his toe ulcer. This is definitely not significantly improved compared to previous but is also not doing any worse to be honest. I did review his MRI today as well. With that being said I do not see any signs of definitive osteomyelitis currently. There was some mention of abnormal T1 and T2 signal intensity in the proximal and middle phalange ease of the second toe which could be due to recent  surgery/stress reaction although osteomyelitis could  not be excluded. Being that the bone at this location seems to be very solid there is no signs of flaking and in general the patient's toe does not appear to be showing signs of significant warmth with the erythema although there is some redness I think this is more just inflammatory as a result of the surgery. My gut feeling is that this does not represent osteomyelitis based on what I am seeing physically as well. The MRI also does not identify any signs of infection anywhere else in the foot which is also good news. In general this is very reassuring in my opinion. All of this was discussed with the patient during the office visit today. Objective Constitutional Well-nourished and well-hydrated in no acute distress. Vitals Time Taken: 10:21 AM, Weight: 220 lbs, Temperature: 98.4 F, Pulse: 76 bpm, Respiratory Rate: 18 breaths/min, Blood Pressure: 146/93 mmHg. Respiratory normal breathing without difficulty. Psychiatric this patient is able to make decisions and demonstrates good insight into disease process. Alert and Oriented x 3. pleasant and cooperative. General Notes: Upon inspection patient's wound bed actually showed signs of bone exposure in the base of the wound obviously this is could be something that we will have to establish some granulation growth with first if we can see this heal. With that being said I do believe that we can we could potentially try Oasis to see if we could get some granular growth over top of the bone I believe that would be an appropriate way to go based on what I am seeing in a small as this is. And the patient is in agreement with giving this a try we will get a look into insurance approval on this currently. Integumentary (Hair, Skin) Wound #1 status is Open. Original cause of wound was Surgical Injury. The date acquired was: 06/11/2020. The wound has been in treatment 1 weeks. The wound is located on  the Right Toe Second. The wound measures 0.3cm length x 0.4cm width x 0.4cm depth; 0.094cm^2 area and 0.038cm^3 volume. There is bone, tendon, and Fat Layer (Subcutaneous Tissue) exposed. There is no tunneling or undermining noted. There is a medium amount of serosanguineous drainage noted. There is no granulation within the wound bed. There is a large (67-100%) amount of necrotic Alex Boyle, Alex Boyle. ([id number]) tissue within the wound bed including Adherent Slough. 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 Bathing/ Shower/ Hygiene: May shower; gently cleanse wound with antibacterial soap, rinse and pat dry prior to dressing wounds Medications-Please add to medication list.: P.O. Antibiotics - Extend doxycycline WOUND #1: - Toe Second Wound Laterality: Right Cleanser: Normal Saline Every Other Day/30 Days Discharge Instructions: Wash your hands with soap and water. Remove old dressing, discard into plastic bag and place into trash. Cleanse the wound with Normal Saline prior to applying a clean dressing using gauze sponges, not tissues or cotton balls. Do not scrub or use excessive force. Pat dry using gauze sponges, not tissue or cotton balls. Primary Dressing: Prisma 4.34 (in) Every Other Day/30 Days Discharge Instructions: Moisten w/normal saline or sterile water; Cover wound as directed. Do not remove from wound bed. Secondary Dressing: Conforming Guaze Roll-Small (Generic) Every Other Day/30 Days Discharge Instructions: Apply Conforming [place] as directed  Secondary Dressing: Gauze Every Other Day/30 Days Discharge  Instructions: Apply over prisma to cover Secured With: 25M Medipore H Soft Cloth Surgical Tape, 2x2 (in/yd) (Generic) Every Other Day/30 Days Discharge Instructions: Secure conform 1. Would recommend currently that we go ahead and continue with the wound care measures as before and the patient is in agreement with the plan. This includes the use of the collagen for the time being which I think is still the best way to go. 2. Am going to currently I Georgina Peer have him continue with the doxycycline for the time being as well. He had an initial 2 weeks once he is done with the 2 weeks we will then see about extending this out probably for another month just to be certain that no infection sets then while we will try to get this healed. 3. I did discuss with him that if were not seeing improvement over the next month and a half as far as getting things to heal and this looking much better and he is getting concerned about potential infection and worsening overall that we can definitely refer him to a surgeon for amputation of the toe he would prefer this over risking losing the foot. I completely understand this. With that being said I think we have a solid chance of getting this to heal especially in light of the essentially negative MRI in my opinion. We will see patient back for reevaluation in 1 week here in the clinic. If anything worsens or changes patient will contact our office for additional recommendations. Electronic Signature(s) Signed: 01/30/2021 11:16:25 AM By: Worthy Keeler PA-C Entered By: Worthy Keeler on 01/30/2021 11:16:25 Alex Boyle ([id number]) -------------------------------------------------------------------------------- SuperBill Details Patient Name: Alex Boyle Date of Service: 01/30/2021 Medical Record Number: [id number] Patient Account Number: [id number] Date of Birth/Sex: July 07, 1945 (75 y.o. 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 I10 Essential (primary) hypertension I25.10 Atherosclerotic heart disease of native coronary artery without angina pectoris Facility Procedures CPT4 Code: [id number] Description: 99213 - WOUND CARE VISIT-LEV 3 EST PT Modifier: Quantity: 1 Physician Procedures CPT4 Code Description: [id number] - WC PHYS LEVEL 4 - EST PT Modifier: Quantity: 1 CPT4 Code Description: ICD-10 Diagnosis Description T81.31XA Disruption of external operation (surgical) wound, not elsewhere classified L97.516 Non-pressure chronic ulcer of other part of right foot with bone involvemen I10 Essential (primary)  hypertension I25.10 Atherosclerotic heart disease of native coronary artery without angina pect Modifier: , initial encounter t without evidence oris Quantity: of necrosis Electronic Signature(s) Signed: 01/30/2021 11:16:36 AM By: Worthy Keeler PA-C Entered By: Worthy Keeler on 01/30/2021 11:16:36

## 2021-01-30 NOTE — Progress Notes (Signed)
DEVERE, GOSEY ([id number]) Visit Report for 01/30/2021 Arrival Information Details Patient Name: Alex Boyle, Alex Boyle. Date of Service: 01/30/2021 10:00 AM Medical Record Number: [id number] Patient Account Number: [id number] Date of Birth/Sex: 1946-03-15 (75 y.o. M) Treating RN: Dolan Amen Primary Care Devonne Kitchen: Abelino Derrick Other Clinician: Referring Maesyn Frisinger: Abelino Derrick Treating Kaylib Furness/Extender: Skipper Cliche in Treatment: 1 Visit Information History Since Last Visit Pain Present Now: No Patient Arrived: Ambulatory Arrival Time: 10:19 Accompanied By: self Transfer Assistance: None Patient Identification Verified: Yes Secondary Verification Process Completed: Yes Electronic Signature(s) Signed: 01/30/2021 4:26:23 PM By: Dolan Amen RN Entered By: Dolan Amen on 01/30/2021 10:21:16 Alex Boyle ([id number]) -------------------------------------------------------------------------------- Clinic Level of Care Assessment Details Patient Name: Alex Boyle Date of Service: 01/30/2021 10:00 AM Medical Record Number: [id number] Patient Account Number: [id number] Date of Birth/Sex: 1945/07/25 (75 y.o. M) Treating RN: Dolan Amen Primary Care Mikita Lesmeister: Abelino Derrick Other Clinician: Referring Milayah Krell: Abelino Derrick Treating Analuisa Tudor/Extender: Skipper Cliche in Treatment: 1 Clinic Level of Care Assessment Items TOOL 4 Quantity Score X - Use when only an EandM is performed on FOLLOW-UP visit 1 0 ASSESSMENTS - Nursing Assessment / Reassessment X - Reassessment of Co-morbidities (includes updates in patient status) 1 10 X- 1 5 Reassessment of Adherence to Treatment Plan ASSESSMENTS - Wound and Skin Assessment / Reassessment X - Simple Wound Assessment / Reassessment - one wound 1 5 '[]'$  - 0 Complex Wound Assessment / Reassessment - multiple wounds '[]'$  - 0 Dermatologic / Skin Assessment (not related to wound area) ASSESSMENTS - Focused Assessment '[]'$  -  Circumferential Edema Measurements - multi extremities 0 '[]'$  - 0 Nutritional Assessment / Counseling / Intervention '[]'$  - 0 Lower Extremity Assessment (monofilament, tuning fork, pulses) '[]'$  - 0 Peripheral Arterial Disease Assessment (using hand held doppler) ASSESSMENTS - Ostomy and/or Continence Assessment and Care '[]'$  - Incontinence Assessment and Management 0 '[]'$  - 0 Ostomy Care Assessment and Management (repouching, etc.) PROCESS - Coordination of Care X - Simple Patient / Family Education for ongoing care 1 15 '[]'$  - 0 Complex (extensive) Patient / Family Education for ongoing care '[]'$  - 0 Staff obtains Programmer, systems, Records, Test Results / Process Orders '[]'$  - 0 Staff telephones HHA, Nursing Homes / Clarify orders / etc '[]'$  - 0 Routine Transfer to another Facility (non-emergent condition) '[]'$  - 0 Routine Hospital Admission (non-emergent condition) '[]'$  - 0 New Admissions / Biomedical engineer / Ordering NPWT, Apligraf, etc. '[]'$  - 0 Emergency Hospital Admission (emergent condition) X- 1 10 Simple Discharge Coordination '[]'$  - 0 Complex (extensive) Discharge Coordination PROCESS - Special Needs '[]'$  - Pediatric / Minor Patient Management 0 '[]'$  - 0 Isolation Patient Management '[]'$  - 0 Hearing / Language / Visual special needs '[]'$  - 0 Assessment of Community assistance (transportation, D/C planning, etc.) '[]'$  - 0 Additional assistance / Altered mentation '[]'$  - 0 Support Surface(s) Assessment (bed, cushion, seat, etc.) INTERVENTIONS - Wound Cleansing / Measurement LENNELL, FLAUGHER ([id number]) X- 1 5 Simple Wound Cleansing - one wound '[]'$  - 0 Complex Wound Cleansing - multiple wounds X- 1 5 Wound Imaging (photographs - any number of wounds) '[]'$  - 0 Wound Tracing (instead of photographs) X- 1 5 Simple Wound Measurement - one wound '[]'$  - 0 Complex Wound Measurement - multiple wounds INTERVENTIONS - Wound Dressings '[]'$  - Small Wound Dressing one or multiple wounds 0 X- 1 15 Medium  Wound Dressing one or multiple wounds '[]'$  - 0 Large Wound Dressing one or multiple wounds '[]'$  - 0 Application  of Medications - topical '[]'$  - 0 Application of Medications - injection INTERVENTIONS - Miscellaneous '[]'$  - External ear exam 0 '[]'$  - 0 Specimen Collection (cultures, biopsies, blood, body fluids, etc.) '[]'$  - 0 Specimen(s) / Culture(s) sent or taken to Lab for analysis '[]'$  - 0 Patient Transfer (multiple staff / Harrel Lemon Lift / Similar devices) '[]'$  - 0 Simple Staple / Suture removal (25 or less) '[]'$  - 0 Complex Staple / Suture removal (26 or more) '[]'$  - 0 Hypo / Hyperglycemic Management (close monitor of Blood Glucose) '[]'$  - 0 Ankle / Brachial Index (ABI) - do not check if billed separately X- 1 5 Vital Signs Has the patient been seen at the hospital within the last three years: Yes Total Score: 80 Level Of Care: New/Established - Level 3 Electronic Signature(s) Signed: 01/30/2021 4:26:23 PM By: Dolan Amen RN Entered By: Dolan Amen on 01/30/2021 10:44:33 Alex Boyle ([id number]) -------------------------------------------------------------------------------- Encounter Discharge Information Details Patient Name: Alex Boyle Date of Service: 01/30/2021 10:00 AM Medical Record Number: [id number] Patient Account Number: [id number] Date of Birth/Sex: 1945/06/20 (75 y.o. M) Treating RN: Dolan Amen Primary Care Fredonia Casalino: Abelino Derrick Other Clinician: Referring Keiden Deskin: Abelino Derrick Treating Kevyn Boquet/Extender: Skipper Cliche in Treatment: 1 Encounter Discharge Information Items Discharge Condition: Stable Ambulatory Status: Ambulatory Discharge Destination: Home Transportation: Private Auto Accompanied By: self Schedule Follow-up Appointment: Yes Clinical Summary of Care: Electronic Signature(s) Signed: 01/30/2021 4:26:23 PM By: Dolan Amen RN Entered By: Dolan Amen on 01/30/2021 10:54:43 Alex Boyle  ([id number]) -------------------------------------------------------------------------------- Lower Extremity Assessment Details Patient Name: Alex Boyle Date of Service: 01/30/2021 10:00 AM Medical Record Number: [id number] Patient Account Number: [id number] Date of Birth/Sex: 01/03/1946 (75 y.o. M) Treating RN: Dolan Amen Primary Care Tashira Torre: Abelino Derrick Other Clinician: Referring Alveria Mcglaughlin: Abelino Derrick Treating Reeder Brisby/Extender: Jeri Cos Weeks in Treatment: 1 Edema Assessment Assessed: [Left: No] [Right: Yes] Edema: [Left: N] [Right: o] Vascular Assessment Pulses: Dorsalis Pedis Palpable: [Right:Yes] Electronic Signature(s) Signed: 01/30/2021 4:26:23 PM By: Dolan Amen RN Entered By: Dolan Amen on 01/30/2021 10:29:04 Alex Boyle ([id number]) -------------------------------------------------------------------------------- Multi Wound Chart Details Patient Name: Alex Boyle Date of Service: 01/30/2021 10:00 AM Medical Record Number: [id number] Patient Account Number: [id number] Date of Birth/Sex: 30-Jan-1946 (75 y.o. M) Treating RN: Dolan Amen Primary Care [place] Decoteau: Abelino Derrick Other Clinician: Referring Christie Viscomi: Abelino Derrick Treating Ewin Rehberg/Extender: Skipper Cliche in Treatment: 1 Vital Signs Height(in): Pulse(bpm): 34 Weight(lbs): 220 Blood Pressure(mmHg): 146/93 Body Mass Index(BMI): Temperature(F): 98.4 Respiratory Rate(breaths/min): 18 Photos: [N/A:N/A] Wound Location: Right Toe Second N/A N/A Wounding Event: Surgical Injury N/A N/A Primary Etiology: Open Surgical Wound N/A N/A Comorbid History: Asthma, Sleep Apnea, N/A N/A Hypertension, Myocardial Infarction, Neuropathy Date Acquired: 06/11/2020 N/A N/A Weeks of Treatment: 1 N/A N/A Wound Status: Open N/A N/A Pending Amputation on Yes N/A N/A Presentation: Measurements L x W x D (cm) 0.3x0.4x0.4 N/A N/A Area (cm) : 0.094 N/A N/A Volume (cm) : 0.038 N/A  N/A % Reduction in Area: 52.00% N/A N/A % Reduction in Volume: 51.90% N/A N/A Classification: Full Thickness With Exposed N/A N/A Support Structures Exudate Amount: Medium N/A N/A Exudate Type: Serosanguineous N/A N/A Exudate Color: red, brown N/A N/A Granulation Amount: None Present (0%) N/A N/A Necrotic Amount: Large (67-100%) N/A N/A Exposed Structures: Fat Layer (Subcutaneous Tissue): N/A N/A Yes Tendon: Yes Bone: Yes Fascia: No Muscle: No Joint: No Epithelialization: None N/A N/A Treatment Notes Electronic Signature(s) Signed: 01/30/2021 4:26:23 PM By: Dolan Amen RN Entered  By: Dolan Amen on 01/30/2021 10:35:52 Alex Boyle ([id number]) -------------------------------------------------------------------------------- [place] Details Patient Name: ATO, CRONEN. Date of Service: 01/30/2021 10:00 AM Medical Record Number: [id number] Patient Account Number: [id number] Date of Birth/Sex: [birth year] (75 y.o. M) Treating RN: Dolan Amen Primary Care Lunna Vogelgesang: Abelino Derrick Other Clinician: Referring Annaleia Pence: Abelino Derrick Treating Rhylan Kagel/Extender: Skipper Cliche in Treatment: 1 Active Inactive Wound/Skin Impairment Nursing Diagnoses: Impaired tissue integrity Goals: Patient/caregiver will verbalize understanding of skin care regimen Date Initiated: 01/20/2021 Target Resolution Date: 01/20/2021 Goal Status: Active Ulcer/skin breakdown will have a volume reduction of 30% by week 4 Date Initiated: 01/20/2021 Target Resolution Date: 02/20/2021 Goal Status: Active Ulcer/skin breakdown will have a volume reduction of 50% by week 8 Date Initiated: 01/20/2021 Target Resolution Date: 03/22/2021 Goal Status: Active Ulcer/skin breakdown will have a volume reduction of 80% by week 12 Date Initiated: 01/20/2021 Target Resolution Date: 04/22/2021 Goal Status: Active Ulcer/skin breakdown will heal within 14 weeks Date Initiated:  01/20/2021 Target Resolution Date: 05/22/2021 Goal Status: Active Interventions: Assess patient/caregiver ability to obtain necessary supplies Assess patient/caregiver ability to perform ulcer/skin care regimen upon admission and as needed Assess ulceration(s) every visit Provide education on ulcer and skin care Treatment Activities: Skin care regimen initiated : 01/20/2021 Notes: Electronic Signature(s) Signed: 01/30/2021 4:26:23 PM By: Dolan Amen RN Entered By: Dolan Amen on 01/30/2021 10:30:35 Alex Boyle ([id number]) -------------------------------------------------------------------------------- Pain Assessment Details Patient Name: Alex Boyle Date of Service: 01/30/2021 10:00 AM Medical Record Number: [id number] Patient Account Number: [id number] Date of Birth/Sex: 02-19-1946 (75 y.o. M) Treating RN: Dolan Amen Primary Care Tanav Orsak: Abelino Derrick Other Clinician: Referring Vi Whitesel: Abelino Derrick Treating Arlis Yale/Extender: Skipper Cliche in Treatment: 1 Active Problems Location of Pain Severity and Description of Pain Patient Has Paino No Site Locations Rate the pain. Current Pain Level: 0 Pain Management and Medication Current Pain Management: Electronic Signature(s) Signed: 01/30/2021 4:26:23 PM By: Dolan Amen RN Entered By: Dolan Amen on 01/30/2021 10:21:41 Alex Boyle ([id number]) -------------------------------------------------------------------------------- Patient/Caregiver Education Details Patient Name: Alex Boyle Date of Service: 01/30/2021 10:00 AM Medical Record Number: [id number] Patient Account Number: [id number] Date of Birth/Gender: 06/16/1945 (75 y.o. M) Treating RN: Dolan Amen Primary Care Physician: Abelino Derrick Other Clinician: Referring Physician: Abelino Derrick Treating Physician/Extender: Skipper Cliche in Treatment: 1 Education Assessment Education Provided To: Patient Education Topics  Provided Wound/Skin Impairment: Methods: Explain/Verbal Responses: State content correctly Electronic Signature(s) Signed: 01/30/2021 4:26:23 PM By: Dolan Amen RN Entered By: Dolan Amen on 01/30/2021 10:45:05 Alex Boyle ([id number]) -------------------------------------------------------------------------------- Wound Assessment Details Patient Name: Alex Boyle Date of Service: 01/30/2021 10:00 AM Medical Record Number: [id number] Patient Account Number: [id number] Date of Birth/Sex: 27-Feb-1946 (75 y.o. M) Treating RN: Dolan Amen Primary Care Karrah Mangini: Abelino Derrick Other Clinician: Referring Becket Wecker: Abelino Derrick Treating Petar Mucci/Extender: Jeri Cos Weeks in Treatment: 1 Wound Status Wound Number: 1 Primary Open Surgical Wound Etiology: Wound Location: Right Toe Second Wound Status: Open Wounding Event: Surgical Injury Comorbid Asthma, Sleep Apnea, Hypertension, Myocardial Date Acquired: 06/11/2020 History: Infarction, Neuropathy Weeks Of Treatment: 1 Clustered Wound: No Pending Amputation On Presentation Photos Wound Measurements Length: (cm) 0.3 Width: (cm) 0.4 Depth: (cm) 0.4 Area: (cm) 0.094 Volume: (cm) 0.038 % Reduction in Area: 52% % Reduction in Volume: 51.9% Epithelialization: None Tunneling: No Undermining: No Wound Description Classification: Full Thickness With Exposed Support Structures Exudate Amount: Medium Exudate Type: Serosanguineous Exudate Color: red, brown Foul Odor After Cleansing: No Slough/Fibrino Yes Wound  Bed Granulation Amount: None Present (0%) Exposed Structure Necrotic Amount: Large (67-100%) Fascia Exposed: No Necrotic Quality: Adherent Slough Fat Layer (Subcutaneous Tissue) Exposed: Yes Tendon Exposed: Yes Muscle Exposed: No Joint Exposed: No Bone Exposed: Yes Treatment Notes Wound #1 (Toe Second) Wound Laterality: Right Cleanser Normal Saline Discharge Instruction: Wash your hands with soap  and water. Remove old dressing, discard into plastic bag and place into trash. Cleanse the wound with Normal Saline prior to applying a clean dressing using gauze sponges, not tissues or cotton balls. Do not scrub or use excessive force. Pat dry using gauze sponges, not tissue or cotton balls. GENNARO, SHANAFELT ([id number]) Peri-Wound Care Topical Primary Dressing Prisma 4.34 (in) Discharge Instruction: Moisten w/normal saline or sterile water; Cover wound as directed. Do not remove from wound bed. Secondary Dressing Conforming Guaze Roll-Small Discharge Instruction: Apply Conforming Stretch Guaze Bandage as directed Gauze Discharge Instruction: Apply over prisma to cover Secured With 13M [employer] Tape, 2x2 (in/yd) Discharge Instruction: Secure conform Compression Wrap Compression Stockings Add-Ons Electronic Signature(s) Signed: 01/30/2021 4:26:23 PM By: Dolan Amen RN Entered By: Dolan Amen on 01/30/2021 10:28:49 Alex Boyle ([id number]) -------------------------------------------------------------------------------- Vitals Details Patient Name: Alex Boyle Date of Service: 01/30/2021 10:00 AM Medical Record Number: [id number] Patient Account Number: [id number] Date of Birth/Sex: 01-Mar-1946 (75 y.o. M) Treating RN: Dolan Amen Primary Care Onika Gudiel: Abelino Derrick Other Clinician: Referring Brissa Asante: Abelino Derrick Treating Murl Golladay/Extender: Skipper Cliche in Treatment: 1 Vital Signs Time Taken: 10:21 Temperature (F): 98.4 Weight (lbs): 220 Pulse (bpm): 76 Respiratory Rate (breaths/min): 18 Blood Pressure (mmHg): 146/93 Reference Range: 80 - 120 mg / dl Electronic Signature(s) Signed: 01/30/2021 4:26:23 PM By: Dolan Amen RN Entered By: Dolan Amen on 01/30/2021 10:21:35

## 2021-01-31 ENCOUNTER — Telehealth: Payer: Self-pay | Admitting: *Deleted

## 2021-01-31 NOTE — Telephone Encounter (Signed)
Left message on f/u call 

## 2021-01-31 NOTE — Telephone Encounter (Signed)
Attempted f/u phone call. No answer. No voicemail, unable to leave message.  

## 2021-02-02 ENCOUNTER — Encounter: Payer: Self-pay | Admitting: Gastroenterology

## 2021-02-06 ENCOUNTER — Encounter: Payer: Federal, State, Local not specified - PPO | Admitting: Physician Assistant

## 2021-02-06 ENCOUNTER — Other Ambulatory Visit: Payer: Self-pay

## 2021-02-06 DIAGNOSIS — T8131XA Disruption of external operation (surgical) wound, not elsewhere classified, initial encounter: Secondary | ICD-10-CM | POA: Diagnosis not present

## 2021-02-06 DIAGNOSIS — Z885 Allergy status to narcotic agent status: Secondary | ICD-10-CM | POA: Diagnosis not present

## 2021-02-06 DIAGNOSIS — L97516 Non-pressure chronic ulcer of other part of right foot with bone involvement without evidence of necrosis: Secondary | ICD-10-CM | POA: Diagnosis not present

## 2021-02-06 DIAGNOSIS — G629 Polyneuropathy, unspecified: Secondary | ICD-10-CM | POA: Diagnosis not present

## 2021-02-06 DIAGNOSIS — Z880 Allergy status to penicillin: Secondary | ICD-10-CM | POA: Diagnosis not present

## 2021-02-06 NOTE — Progress Notes (Addendum)
Alex Boyle, Alex Boyle ([id number]) Visit Report for 02/06/2021 Chief Complaint Document Details Patient Name: Alex Boyle, Alex Boyle. Date of Service: 02/06/2021 12:30 PM Medical Record Number: [id number] Patient Account Number: [id number] Date of Birth/Sex: [birth year] (75 y.o. M) Treating RN: Donnamarie Poag Primary Care Provider: Abelino Derrick Other Clinician: Referring Provider: Abelino Derrick Treating Provider/Extender: Skipper Cliche in Treatment: 2 Information Obtained from: Patient Chief Complaint Right 2nd Toe Ulcer Electronic Signature(s) Signed: 02/06/2021 12:53:31 PM By: Worthy Keeler PA-C Entered By: Worthy Keeler on 02/06/2021 12:53:31 Alex Boyle, Alex Boyle ([id number]) -------------------------------------------------------------------------------- Debridement Details Patient Name: Alex Boyle Date of Service: 02/06/2021 12:30 PM Medical Record Number: [id number] Patient Account Number: [id number] Date of Birth/Sex: 1945-11-20 (75 y.o. M) Treating RN: Donnamarie Poag Primary Care Provider: Abelino Derrick Other Clinician: Referring Provider: Abelino Derrick Treating Provider/Extender: Skipper Cliche in Treatment: 2 Debridement Performed for Wound #1 Right Toe Second Assessment: Performed By: Physician Tommie Sams., PA-C Debridement Type: Debridement Level of Consciousness (Pre- Awake and Alert procedure): Pre-procedure Verification/Time Out Yes - 12:55 Taken: Start Time: 12:55 Pain Control: Lidocaine Total Area Debrided (L x W): 0.2 (cm) x 0.2 (cm) = 0.04 (cm) Tissue and other material Viable, Non-Viable, Slough, Subcutaneous, Biofilm, Slough debrided: Level: Skin/Subcutaneous Tissue Debridement Description: Excisional Instrument: Curette Bleeding: Minimum Hemostasis Achieved: Pressure End Time: 12:58 Response to Treatment: Procedure was tolerated well Level of Consciousness (Post- Awake and Alert procedure): Post Debridement Measurements of Total Wound Length:  (cm) 0.2 Width: (cm) 0.2 Depth: (cm) 0.3 Volume: (cm) 0.009 Character of Wound/Ulcer Post Debridement: Improved Post Procedure Diagnosis Same as Pre-procedure Electronic Signature(s) Signed: 02/06/2021 5:05:16 PM By: Worthy Keeler PA-C Signed: 02/07/2021 3:54:00 PM By: Donnamarie Poag Entered By: Donnamarie Poag on 02/06/2021 12:56:12 Alex Boyle ([id number]) -------------------------------------------------------------------------------- HPI Details Patient Name: Alex Boyle Date of Service: 02/06/2021 12:30 PM Medical Record Number: [id number] Patient Account Number: [id number] Date of Birth/Sex: 18-Jan-1946 (75 y.o. M) Treating RN: Donnamarie Poag Primary Care Provider: Abelino Derrick Other Clinician: Referring Provider: Abelino Derrick Treating Provider/Extender: Skipper Cliche in Treatment: 2 History of Present Illness HPI Description: 01/20/2021 upon evaluation today patient appears to be doing somewhat poorly in regard to a surgery that he had that was actually on 06/10/2020. He tells me that the toe was subsequently a hammertoe and this needed to be corrected by surgical intervention. When they got and the joint was so deteriorated and ended up having to actually pin and fuse the toe. This no longer Britta Mccreedy has given him some trouble he tells me. With that being said he has been seeing [facility], Dr. Sherryle Lis, and the only thing that is really been mentioned is going to try to remove a portion of the toe in order to basically it sounds like refuse this. Nonetheless I really do not know that the patient is interested in going down that road at this point based on what I am hearing. He does have noted culture on 12/22/2020 which was positive for Serratia. Nonetheless he has been on doxycycline though that just ended today. I would probably extend this for him due to the fact that I want him to be on this plenty in order to justify a good trial of antibiotics as well as wound  care before potential for hyperbarics if he does indeed have osteomyelitis noted. The good news is his ABIs are on the right 1.38 with a TBI of 0.79 and on the left 1.21 with a TBI of 0.70. This was on  11/23/2020. Notably the patient is obviously very frustrated with the situation he has not had an MRI at this point since the surgery. I think that is really what we need to look into doing. Recently well8/22/2022 upon evaluation today patient appears to be doing regard to his toe ulcer. This is definitely not significantly improved compared to previous but is also not doing any worse to be honest. I did review his MRI today as well. With that being said I do not see any signs of definitive osteomyelitis currently. There was some mention of abnormal T1 and T2 signal intensity in the proximal and middle phalange ease of the second toe which could be due to recent surgery/stress reaction although osteomyelitis could not be excluded. Being that the bone at this location seems to be very solid there is no signs of flaking and in general the patient's toe does not appear to be showing signs of significant warmth with the erythema although there is some redness I think this is more just inflammatory as a result of the surgery. My gut feeling is that this does not represent osteomyelitis based on what I am seeing physically as well. The MRI also does not identify any signs of infection anywhere else in the foot which is also good news. In general this is very reassuring in my opinion. All of this was discussed with the patient during the office visit today. 02/06/2021 upon evaluation today patient's toe actually appears to be doing decently well today. There is still bone noted in the base of the toe but in general I think we are making some progress and this seems to be closing in quite nicely. With that being said he is going require little bit of debridement today. Also did discuss with him that we got the  results back from the reaching out to the insurance company about the [place] unfortunately that was denied as a not a covered service. In the meantime I think continuing with the collagen is probably the best way to go. Electronic Signature(s) Signed: 02/06/2021 4:44:50 PM By: Worthy Keeler PA-C Entered By: Worthy Keeler on 02/06/2021 16:44:50 Alex Boyle, Alex Boyle ([id number]) -------------------------------------------------------------------------------- Physical Exam Details Patient Name: Alex Boyle Date of Service: 02/06/2021 12:30 PM Medical Record Number: [id number] Patient Account Number: [id number] Date of Birth/Sex: [birth year] (75 y.o. M) Treating RN: Donnamarie Poag Primary Care Provider: Abelino Derrick Other Clinician: Referring Provider: Abelino Derrick Treating Provider/Extender: Skipper Cliche in Treatment: 2 Constitutional Well-nourished and well-hydrated in no acute distress. Respiratory normal breathing without difficulty. Psychiatric this patient is able to make decisions and demonstrates good insight into disease process. Alert and Oriented x 3. pleasant and cooperative. Notes Upon inspection patient's wound bed actually showed signs of good granulation epithelization at this point. Fortunately there does not appear to be any signs of infection which is great and though he has bone exposed in the base of the wound this seems to be solid. I did clear away some of the drainage as well as dry skin around the edges of the wound and again everything appears to be quite nice as far as the overall appearance postdebridement. Electronic Signature(s) Signed: 02/06/2021 4:45:40 PM By: Worthy Keeler PA-C Entered By: Worthy Keeler on 02/06/2021 16:45:40 Alex Boyle ([id number]) -------------------------------------------------------------------------------- Physician Orders Details Patient Name: Alex Boyle Date of Service: 02/06/2021 12:30 PM Medical Record Number:  [id number] Patient Account Number: [id number] Date of Birth/Sex: 11-05-1945 (75 y.o. 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 Bathing/ Shower/ Hygiene o May shower; gently cleanse wound with antibacterial soap, rinse and pat dry prior to dressing wounds Edema Control - Lymphedema / Segmental Compressive Device / Other o Elevate leg(s) parallel to the floor when sitting. o DO YOUR BEST to sleep in the bed at night. DO NOT sleep in your recliner. Long hours of sitting in a recliner leads to swelling of the legs and/or potential wounds on your backside. Off-Loading Wound #1 Right Toe Second o Other: - KEEP PRESSURE OFF OF WOUND Medications-Please add to medication list. o P.O. Antibiotics - Extend doxycycline Wound Treatment Wound #1 - Toe Second Wound Laterality: Right Cleanser: Normal Saline Every Other Day/30 Days Discharge Instructions: Wash your hands with soap and water. Remove old dressing, discard into plastic bag and place into trash. Cleanse the wound with Normal Saline prior to applying a clean dressing using gauze sponges, not tissues or cotton balls. Do not scrub or use excessive force. Pat dry using gauze sponges, not tissue or cotton balls. Primary Dressing: Prisma 4.34 (in) Every Other Day/30 Days Discharge Instructions: Moisten w/normal saline or sterile  water; Cover wound as directed. Do not remove from wound bed. Secondary Dressing: Conforming Guaze Roll-Small (Generic) Every Other Day/30 Days Discharge Instructions: [place] as directed Secondary Dressing: Gauze Every Other Day/30 Days Discharge Instructions: Apply over prisma to cover Secured With: 44M [place] Surgical Tape, 2x2 (in/yd) (Generic) Every Other Day/30 Days Discharge Instructions: Secure conform Patient Medications Allergies: codeine, penicillin Notifications Medication Indication Start End doxycycline monohydrate 02/06/2021 DOSE 1 - oral 100 mg capsule - 1 capsule oral taken 2 times per day for 30 days LANGLEY, KROM ([id number]) Electronic Signature(s) Signed: 02/06/2021 4:49:57 PM By: Worthy Keeler PA-C Entered By: Worthy Keeler on 02/06/2021 16:49:56 Alex Boyle, Alex Boyle ([id number]) -------------------------------------------------------------------------------- Problem List Details Patient Name: Alex Boyle Date of Service: 02/06/2021 12:30 PM Medical Record Number: [id number] Patient Account Number: [id number] Date of Birth/Sex: 12-25-1945 (75 y.o. 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 M) Treating RN: Donnamarie Poag Primary Care Provider: Abelino Derrick Other Clinician: Referring Provider: Abelino Derrick Treating Provider/Extender: Skipper Cliche in Treatment: 2 Subjective Chief Complaint Information  obtained from Patient Right 2nd Toe Ulcer History of Present Illness (HPI) 01/20/2021 upon evaluation today patient appears to be doing somewhat poorly in regard to a surgery that he had that was actually on 06/10/2020. He tells me that the toe was subsequently a hammertoe and this needed to be corrected by surgical intervention. When they got and the joint was so deteriorated and ended up having to actually pin and fuse the toe. This no longer Britta Mccreedy has given him some trouble he tells me. With that being said he has been seeing [facility], Dr. Sherryle Lis, and the only thing that is really been mentioned is going to try to remove a portion of the toe in order to basically it sounds like refuse this. Nonetheless I really do not know that the patient is interested in going down that road at this point based on what I am hearing. He does have noted culture on 12/22/2020 which was positive for Serratia. Nonetheless he has been on doxycycline though that just ended today. I would probably extend this for him due to the fact that I want him to be on this plenty in order to justify a good trial of antibiotics as well as wound care before potential for hyperbarics if he does indeed have osteomyelitis noted. The good news is his ABIs are on the right 1.38 with a TBI of 0.79 and on the left 1.21 with a TBI of 0.70. This was on  11/23/2020. Notably the patient is obviously very frustrated with the situation he has not had an MRI at this point since the surgery. I think that is really what we need to look into doing. Recently well8/22/2022 upon evaluation today patient appears to be doing regard to his toe ulcer. This is definitely not significantly improved compared to previous but is also not doing any worse to be honest. I did review his MRI today as well. With that being said I do not see any signs of definitive osteomyelitis currently. There was some mention of abnormal T1 and T2 signal intensity in the proximal and middle phalange ease of the second toe which could be due to recent surgery/stress reaction although osteomyelitis could not be excluded. Being that the bone at this location seems to be very solid there is no signs of flaking and in general the patient's toe does not appear to be showing signs of significant warmth with the erythema although there is some redness I think this is more just inflammatory as a result of the surgery. My gut feeling is that this does not represent osteomyelitis based on what I am seeing physically as well. The MRI also does not identify any signs of infection anywhere else in the foot which is also good news. In general this is very reassuring in my opinion. All of this was discussed with the patient during the office visit today. 02/06/2021 upon evaluation today patient's toe actually appears to be doing decently well today. There is still bone noted in the base of the toe but in general I think we are making some progress and this seems to be closing in quite nicely. With that being said he is going require little bit of debridement today. Also did discuss with him that we got the results back from the reaching out to the insurance company about the [place] unfortunately that was denied as a not a covered service. In the meantime I think continuing with the collagen is probably the  best way to  go. Objective Constitutional Well-nourished and well-hydrated in no acute distress. Vitals Time Taken: 12:43 PM, Weight: 220 lbs, Temperature: 98.1 F, Pulse: 74 bpm, Respiratory Rate: 16 breaths/min, Blood Pressure: 110/72 mmHg. Respiratory normal breathing without difficulty. Psychiatric this patient is able to make decisions and demonstrates good insight into disease process. Alert and Oriented x 3. pleasant and cooperative. General Notes: Upon inspection patient's wound bed actually showed signs of good granulation epithelization at this point. Fortunately there does not appear to be any signs of infection which is great and though he has bone exposed in the base of the wound this seems to be solid. I did clear away some of the drainage as well as dry skin around the edges of the wound and again everything appears to be quite nice as far as the overall appearance postdebridement. Integumentary (Hair, Skin) Wound #1 status is Open. Original cause of wound was Surgical Injury. The date acquired was: 06/11/2020. The wound has been in treatment 2 Alex Boyle, Alex Boyle. ([id number]) weeks. The wound is located on the Right Toe Second. The wound measures 0.2cm length x 0.2cm width x 0.3cm depth; 0.031cm^2 area and 0.009cm^3 volume. There is bone, tendon, and Fat Layer (Subcutaneous Tissue) exposed. There is no tunneling or undermining noted. There is a medium amount of serosanguineous drainage noted. There is large (67-100%) red, pink granulation within the wound bed. There is a small (1-33%) amount of necrotic tissue within the wound bed including Adherent Slough. 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 There was a Excisional Skin/Subcutaneous Tissue Debridement with a total area of 0.04 sq cm performed by Tommie Sams., PA-C. With the following instrument(s): Curette to remove Viable and Non-Viable tissue/material. Material removed includes Subcutaneous Tissue, Slough, and Biofilm after achieving pain control using Lidocaine. A time out was conducted at 12:55, prior to the start of the procedure. A Minimum amount of bleeding was controlled with Pressure. The procedure was tolerated well. Post Debridement Measurements: 0.2cm length x 0.2cm width x 0.3cm depth; 0.009cm^3 volume. Character of Wound/Ulcer Post Debridement is improved. Post procedure Diagnosis Wound #1: Same as Pre-Procedure Plan Follow-up Appointments: Return Appointment in 1 week. Bathing/ Shower/ Hygiene: May shower; gently cleanse wound with antibacterial soap, rinse and pat dry prior to dressing wounds Edema Control - Lymphedema / Segmental Compressive Device / Other: Elevate leg(s) parallel to the floor when sitting. DO YOUR BEST to sleep in the bed at night. DO NOT sleep in your recliner. Long hours of sitting in a recliner leads to swelling of the legs and/or potential wounds on your backside. Off-Loading: Wound #1 Right Toe Second: Other: - KEEP PRESSURE OFF OF WOUND Medications-Please add to medication list.: P.O. Antibiotics - Extend doxycycline The following medication(s) was prescribed: doxycycline monohydrate oral 100 mg capsule 1 1 capsule oral taken 2 times per day for 30 days starting 02/06/2021 WOUND #1: - Toe Second Wound Laterality: Right Cleanser: Normal Saline Every Other Day/30 Days Discharge Instructions: Wash your hands with soap and water. Remove old dressing, discard into plastic bag and place into trash. Cleanse the wound with Normal Saline prior to applying  a clean dressing using gauze sponges, not tissues  or cotton balls. Do not scrub or use excessive force. Pat dry using gauze sponges, not tissue or cotton balls. Primary Dressing: Prisma 4.34 (in) Every Other Day/30 Days Discharge Instructions: Moisten w/normal saline or sterile water; Cover wound as directed. Do not remove from wound bed. Secondary Dressing: Conforming Guaze Roll-Small (Generic) Every Other Day/30 Days Discharge Instructions: [place] as directed Secondary Dressing: Gauze Every Other Day/30 Days Discharge Instructions: Apply over prisma to cover Secured With: 65M [place] Surgical Tape, 2x2 (in/yd) (Generic) Every Other Day/30 Days Discharge Instructions: Secure conform Alex Boyle, Alex Boyle ([id number]) 1. Would recommend currently that we go ahead and continue with the silver collagen which I think is actually doing a pretty decent job. 2. Muscle can recommend that we will continue to see the patient to clear away any of the necrotic debris as this continues to heal my hope is that we can get this to close up that is the goal. 3. I am also can recommend that we go ahead and send in a refill for the medication for him. He has been taking the doxycycline not giving that to him for 14 days. That was on the 12th. I Minna refill that for an additional 30 days and hopefully this will make sure that everything completely clears and that there is no infection that continues to cause issues for him long-term. The patient is in agreement with this plan. We will see patient back for reevaluation in 1 week here in the clinic. If anything worsens or changes patient will contact our office for additional recommendations. Electronic Signature(s) Signed: 02/06/2021 4:50:02 PM By: Worthy Keeler PA-C Entered By: Worthy Keeler on 02/06/2021 16:50:02 Alex Boyle, Alex Boyle  ([id number]) -------------------------------------------------------------------------------- SuperBill Details Patient Name: Alex Boyle Date of Service: 02/06/2021 Medical Record Number: [id number] Patient Account Number: [id number] Date of Birth/Sex: [birth year] (75 y.o. 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 I10 Essential (primary) hypertension I25.10 Atherosclerotic heart disease of native coronary artery without angina pectoris Facility Procedures CPT4 Code Description: [id number] - DEB SUBQ TISSUE 20 SQ CM/< Modifier: Quantity: 1 CPT4 Code Description: ICD-10 Diagnosis Description 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 Modifier: without evidence Quantity: of necrosis Physician Procedures CPT4 Code Description: [id number] - WC PHYS LEVEL 4 - EST PT Modifier: 25 Quantity: 1 CPT4 Code Description: ICD-10 Diagnosis Description T81.31XA Disruption of external operation (surgical) wound, not elsewhere classified, 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

## 2021-02-07 NOTE — Progress Notes (Signed)
LINFORD, Boyle ([id number]) Visit Report for 02/06/2021 Arrival Information Details Patient Name: Alex Boyle, Alex Boyle. Date of Service: 02/06/2021 12:30 PM Medical Record Number: [id number] Patient Account Number: [id number] Date of Birth/Sex: [birth year] (75 y.o. M) Treating RN: Donnamarie Poag Primary Care Waneda Klammer: Abelino Derrick Other Clinician: Referring Jolane Bankhead: Abelino Derrick Treating Conlin Brahm/Extender: Skipper Cliche in Treatment: 2 Visit Information History Since Last Visit Added or deleted any medications: No Patient Arrived: Ambulatory Had a fall or experienced change in No Arrival Time: 12:41 activities of daily living that may affect Accompanied By: self risk of falls: Transfer Assistance: None Hospitalized since last visit: No Has Dressing in Place as Prescribed: Yes Pain Present Now: Yes Electronic Signature(s) Signed: 02/07/2021 3:54:00 PM By: Donnamarie Poag Entered By: Donnamarie Poag on 02/06/2021 12:43:07 Alex Boyle ([id number]) -------------------------------------------------------------------------------- Clinic Level of Care Assessment Details Patient Name: Alex Boyle Date of Service: 02/06/2021 12:30 PM Medical Record Number: [id number] Patient Account Number: [id number] Date of Birth/Sex: January 04, 1946 (74 y.o. M) Treating RN: Donnamarie Poag Primary Care Ayaan Shutes: Abelino Derrick Other Clinician: Referring Pleas Carneal: Abelino Derrick Treating Alyxandra Tenbrink/Extender: Skipper Cliche in Treatment: 2 Clinic Level of Care Assessment Items TOOL 1 Quantity Score '[]'$  - Use when EandM and Procedure is performed on INITIAL visit 0 ASSESSMENTS - Nursing Assessment / Reassessment '[]'$  - General Physical Exam (combine w/ comprehensive assessment (listed just below) when performed on new 0 pt. evals) '[]'$  - 0 Comprehensive Assessment (HX, ROS, Risk Assessments, Wounds Hx, etc.) ASSESSMENTS - Wound and Skin Assessment / Reassessment '[]'$  - Dermatologic / Skin Assessment (not related  to wound area) 0 ASSESSMENTS - Ostomy and/or Continence Assessment and Care '[]'$  - Incontinence Assessment and Management 0 '[]'$  - 0 Ostomy Care Assessment and Management (repouching, etc.) PROCESS - Coordination of Care '[]'$  - Simple Patient / Family Education for ongoing care 0 '[]'$  - 0 Complex (extensive) Patient / Family Education for ongoing care '[]'$  - 0 Staff obtains Programmer, systems, Records, Test Results / Process Orders '[]'$  - 0 Staff telephones HHA, Nursing Homes / Clarify orders / etc '[]'$  - 0 Routine Transfer to another Facility (non-emergent condition) '[]'$  - 0 Routine Hospital Admission (non-emergent condition) '[]'$  - 0 New Admissions / Biomedical engineer / Ordering NPWT, Apligraf, etc. '[]'$  - 0 Emergency Hospital Admission (emergent condition) PROCESS - Special Needs '[]'$  - Pediatric / Minor Patient Management 0 '[]'$  - 0 Isolation Patient Management '[]'$  - 0 Hearing / Language / Visual special needs '[]'$  - 0 Assessment of Community assistance (transportation, D/C planning, etc.) '[]'$  - 0 Additional assistance / Altered mentation '[]'$  - 0 Support Surface(s) Assessment (bed, cushion, seat, etc.) INTERVENTIONS - Miscellaneous '[]'$  - External ear exam 0 '[]'$  - 0 Patient Transfer (multiple staff / Civil Service fast streamer / Similar devices) '[]'$  - 0 Simple Staple / Suture removal (25 or less) '[]'$  - 0 Complex Staple / Suture removal (26 or more) '[]'$  - 0 Hypo/Hyperglycemic Management (do not check if billed separately) '[]'$  - 0 Ankle / Brachial Index (ABI) - do not check if billed separately Has the patient been seen at the hospital within the last three years: Yes Total Score: 0 Level Of Care: ____ Alex Boyle ([id number]) Electronic Signature(s) Signed: 02/07/2021 3:54:00 PM By: Donnamarie Poag Entered By: Donnamarie Poag on 02/06/2021 12:59:16 Alex Boyle ([id number]) -------------------------------------------------------------------------------- Encounter Discharge Information Details Patient Name: Alex Boyle Date of Service: 02/06/2021 12:30 PM Medical Record Number: [id number] Patient Account Number: [id number] Date of Birth/Sex: July 23, 1945 (75 y.o. M) Treating RN: Donnamarie Poag  Primary Care Daliah Chaudoin: Abelino Derrick Other Clinician: Referring Mj Willis: Abelino Derrick Treating Daltin Crist/Extender: Skipper Cliche in Treatment: 2 Encounter Discharge Information Items Post Procedure Vitals Discharge Condition: Stable Temperature (F): 98.1 Ambulatory Status: Ambulatory Pulse (bpm): 74 Discharge Destination: Home Respiratory Rate (breaths/min): 16 Transportation: Private Auto Blood Pressure (mmHg): 110/72 Accompanied By: self Schedule Follow-up Appointment: Yes Clinical Summary of Care: Electronic Signature(s) Signed: 02/07/2021 3:54:00 PM By: Donnamarie Poag Entered By: Donnamarie Poag on 02/06/2021 13:01:00 Alex Boyle ([id number]) -------------------------------------------------------------------------------- Lower Extremity Assessment Details Patient Name: Alex Boyle Date of Service: 02/06/2021 12:30 PM Medical Record Number: [id number] Patient Account Number: [id number] Date of Birth/Sex: 1945-06-19 (75 y.o. M) Treating RN: Donnamarie Poag Primary Care Mikele Sifuentes: Abelino Derrick Other Clinician: Referring Lunetta Marina: Abelino Derrick Treating Mckenzey Parcell/Extender: Jeri Cos Weeks in Treatment: 2 Edema Assessment Assessed: [Left: No] [Right: Yes] Edema: [Left: N] [Right: o] Vascular Assessment Pulses: Dorsalis Pedis Palpable: [Right:Yes] Electronic Signature(s) Signed: 02/07/2021 3:54:00 PM By: Donnamarie Poag Entered By: Donnamarie Poag on 02/06/2021 12:49:57 Alex Boyle ([id number]) -------------------------------------------------------------------------------- Multi Wound Chart Details Patient Name: Alex Boyle Date of Service: 02/06/2021 12:30 PM Medical Record Number: [id number] Patient Account Number: [id number] Date of Birth/Sex: December 05, 1945 (75 y.o. M) Treating RN:  Donnamarie Poag Primary Care Suheyla Mortellaro: Abelino Derrick Other Clinician: Referring Brandyn Lowrey: Abelino Derrick Treating Cindy Fullman/Extender: Skipper Cliche in Treatment: 2 Vital Signs Height(in): Pulse(bpm): 73 Weight(lbs): 220 Blood Pressure(mmHg): 110/72 Body Mass Index(BMI): Temperature(F): 98.1 Respiratory Rate(breaths/min): 16 Photos: [N/A:N/A] Wound Location: Right Toe Second N/A N/A Wounding Event: Surgical Injury N/A N/A Primary Etiology: Open Surgical Wound N/A N/A Comorbid History: Asthma, Sleep Apnea, N/A N/A Hypertension, Myocardial Infarction, Neuropathy Date Acquired: 06/11/2020 N/A N/A Weeks of Treatment: 2 N/A N/A Wound Status: Open N/A N/A Pending Amputation on Yes N/A N/A Presentation: Measurements L x W x D (cm) 0.2x0.2x0.3 N/A N/A Area (cm) : 0.031 N/A N/A Volume (cm) : 0.009 N/A N/A % Reduction in Area: 84.20% N/A N/A % Reduction in Volume: 88.60% N/A N/A Classification: Full Thickness With Exposed N/A N/A Support Structures Exudate Amount: Medium N/A N/A Exudate Type: Serosanguineous N/A N/A Exudate Color: red, brown N/A N/A Granulation Amount: Large (67-100%) N/A N/A Granulation Quality: Red, Pink N/A N/A Necrotic Amount: Small (1-33%) N/A N/A Exposed Structures: Fat Layer (Subcutaneous Tissue): N/A N/A Yes Bone: Yes Fascia: No Tendon: No Muscle: No Joint: No Epithelialization: Small (1-33%) N/A N/A Treatment Notes Electronic Signature(s) Signed: 02/07/2021 3:54:00 PM By: Donnamarie Poag Entered By: Donnamarie Poag on 02/06/2021 12:50:59 Alex Boyle ([id number]) -------------------------------------------------------------------------------- Multi-Disciplinary Care Plan Details Patient Name: Alex Boyle Date of Service: 02/06/2021 12:30 PM Medical Record Number: [id number] Patient Account Number: [id number] Date of Birth/Sex: 05-22-1946 (75 y.o. M) Treating RN: Donnamarie Poag Primary Care [place] Moss: Abelino Derrick Other Clinician: Referring  Sheccid Lahmann: Abelino Derrick Treating Lazette Estala/Extender: Skipper Cliche in Treatment: 2 Active Inactive Wound/Skin Impairment Nursing Diagnoses: Impaired tissue integrity Goals: Patient/caregiver will verbalize understanding of skin care regimen Date Initiated: 01/20/2021 Target Resolution Date: 01/20/2021 Goal Status: Active Ulcer/skin breakdown will have a volume reduction of 30% by week 4 Date Initiated: 01/20/2021 Target Resolution Date: 02/20/2021 Goal Status: Active Ulcer/skin breakdown will have a volume reduction of 50% by week 8 Date Initiated: 01/20/2021 Target Resolution Date: 03/22/2021 Goal Status: Active Ulcer/skin breakdown will have a volume reduction of 80% by week 12 Date Initiated: 01/20/2021 Target Resolution Date: 04/22/2021 Goal Status: Active Ulcer/skin breakdown will heal within 14 weeks Date Initiated: 01/20/2021 Target Resolution Date: 05/22/2021 Goal Status: Active Interventions: Assess patient/caregiver ability  to obtain necessary supplies Assess patient/caregiver ability to perform ulcer/skin care regimen upon admission and as needed Assess ulceration(s) every visit Provide education on ulcer and skin care Treatment Activities: Skin care regimen initiated : 01/20/2021 Notes: Electronic Signature(s) Signed: 02/07/2021 3:54:00 PM By: Donnamarie Poag Entered By: Donnamarie Poag on 02/06/2021 12:50:36 Alex Boyle ([id number]) -------------------------------------------------------------------------------- Pain Assessment Details Patient Name: Alex Boyle Date of Service: 02/06/2021 12:30 PM Medical Record Number: [id number] Patient Account Number: [id number] Date of Birth/Sex: 12/19/1945 (75 y.o. M) Treating RN: Donnamarie Poag Primary Care Dmarco Baldus: Abelino Derrick Other Clinician: Referring Maiyah Goyne: Abelino Derrick Treating Earley Grobe/Extender: Skipper Cliche in Treatment: 2 Active Problems Location of Pain Severity and Description of Pain Patient Has  Paino Yes Site Locations Rate the pain. Current Pain Level: 2 Pain Management and Medication Current Pain Management: Electronic Signature(s) Signed: 02/07/2021 3:54:00 PM By: Donnamarie Poag Entered By: Donnamarie Poag on 02/06/2021 12:45:44 Alex Boyle ([id number]) -------------------------------------------------------------------------------- Patient/Caregiver Education Details Patient Name: Alex Boyle Date of Service: 02/06/2021 12:30 PM Medical Record Number: [id number] Patient Account Number: [id number] Date of Birth/Gender: Oct 05, 1945 (75 y.o. M) Treating RN: Donnamarie Poag Primary Care Physician: Abelino Derrick Other Clinician: Referring Physician: Abelino Derrick Treating Physician/Extender: Skipper Cliche in Treatment: 2 Education Assessment Education Provided To: Patient Education Topics Provided Basic Hygiene: Wound Debridement: Wound/Skin Impairment: Electronic Signature(s) Signed: 02/07/2021 3:54:00 PM By: Donnamarie Poag Entered By: Donnamarie Poag on 02/06/2021 12:59:37 Alex Boyle ([id number]) -------------------------------------------------------------------------------- Wound Assessment Details Patient Name: Alex Boyle Date of Service: 02/06/2021 12:30 PM Medical Record Number: [id number] Patient Account Number: [id number] Date of Birth/Sex: Sep 11, 1945 (75 y.o. M) Treating RN: Donnamarie Poag Primary Care Placida Cambre: Abelino Derrick Other Clinician: Referring Kiwanna Spraker: Abelino Derrick Treating Delshawn Stech/Extender: Jeri Cos Weeks in Treatment: 2 Wound Status Wound Number: 1 Primary Open Surgical Wound Etiology: Wound Location: Right Toe Second Wound Status: Open Wounding Event: Surgical Injury Comorbid Asthma, Sleep Apnea, Hypertension, Myocardial Date Acquired: 06/11/2020 History: Infarction, Neuropathy Weeks Of Treatment: 2 Clustered Wound: No Pending Amputation On Presentation Photos Wound Measurements Length: (cm) 0.2 Width: (cm) 0.2 Depth: (cm)  0.3 Area: (cm) 0.031 Volume: (cm) 0.009 % Reduction in Area: 84.2% % Reduction in Volume: 88.6% Epithelialization: Small (1-33%) Tunneling: No Undermining: No Wound Description Classification: Full Thickness With Exposed Support Structures Exudate Amount: Medium Exudate Type: Serosanguineous Exudate Color: red, brown Foul Odor After Cleansing: No Slough/Fibrino Yes Wound Bed Granulation Amount: Large (67-100%) Exposed Structure Granulation Quality: Red, Pink Fascia Exposed: No Necrotic Amount: Small (1-33%) Fat Layer (Subcutaneous Tissue) Exposed: Yes Necrotic Quality: Adherent Slough Tendon Exposed: Yes Muscle Exposed: No Joint Exposed: No Bone Exposed: Yes Treatment Notes Wound #1 (Toe Second) Wound Laterality: Right Cleanser Normal Saline Discharge Instruction: Wash your hands with soap and water. Remove old dressing, discard into plastic bag and place into trash. Cleanse the wound with Normal Saline prior to applying a clean dressing using gauze sponges, not tissues or cotton balls. Do not scrub or use excessive force. Pat dry using gauze sponges, not tissue or cotton balls. JAXSTON, MANCUSI ([id number]) Peri-Wound Care Topical Primary Dressing Prisma 4.34 (in) Discharge Instruction: Moisten w/normal saline or sterile water; Cover wound as directed. Do not remove from wound bed. Secondary Dressing Conforming Guaze Roll-Small Discharge Instruction: Apply Conforming Stretch Guaze Bandage as directed Gauze Discharge Instruction: Apply over prisma to cover Secured With 10M [place] [employer] Tape, 2x2 (in/yd) Discharge Instruction: Secure conform Compression Wrap Compression Stockings Add-Ons Electronic Signature(s) Signed: 02/07/2021 3:54:00 PM By: Donnamarie Poag  Entered By: Donnamarie Poag on 02/06/2021 12:59:55 Alex Boyle ([id number]) -------------------------------------------------------------------------------- [place] Details Patient Name: Alex Boyle Date of Service: 02/06/2021 12:30 PM Medical Record Number: [id number] Patient Account Number: [id number] Date of Birth/Sex: 12-Jan-1946 (75 y.o. M) Treating RN: Donnamarie Poag Primary Care Charlot Gouin: Abelino Derrick Other Clinician: Referring Jahlia Omura: Abelino Derrick Treating Artavius Stearns/Extender: Skipper Cliche in Treatment: 2 Vital Signs Time Taken: 12:43 Temperature (F): 98.1 Weight (lbs): 220 Pulse (bpm): 74 Respiratory Rate (breaths/min): 16 Blood Pressure (mmHg): 110/72 Reference Range: 80 - 120 mg / dl Electronic Signature(s) Signed: 02/07/2021 3:54:00 PM By: Donnamarie Poag Entered ByDonnamarie Poag on 02/06/2021 12:45:17

## 2021-02-09 ENCOUNTER — Encounter: Payer: Federal, State, Local not specified - PPO | Admitting: Podiatry

## 2021-02-14 ENCOUNTER — Other Ambulatory Visit: Payer: Self-pay

## 2021-02-14 ENCOUNTER — Encounter: Payer: Self-pay | Admitting: Family Medicine

## 2021-02-14 ENCOUNTER — Ambulatory Visit (INDEPENDENT_AMBULATORY_CARE_PROVIDER_SITE_OTHER): Payer: Federal, State, Local not specified - PPO | Admitting: Family Medicine

## 2021-02-14 ENCOUNTER — Encounter: Payer: Federal, State, Local not specified - PPO | Attending: Physician Assistant | Admitting: Physician Assistant

## 2021-02-14 VITALS — BP 108/66 | HR 61 | Temp 97.6°F | Ht 71.0 in | Wt 218.6 lb

## 2021-02-14 DIAGNOSIS — I2510 Atherosclerotic heart disease of native coronary artery without angina pectoris: Secondary | ICD-10-CM | POA: Diagnosis not present

## 2021-02-14 DIAGNOSIS — T7840XA Allergy, unspecified, initial encounter: Secondary | ICD-10-CM

## 2021-02-14 DIAGNOSIS — I10 Essential (primary) hypertension: Secondary | ICD-10-CM | POA: Insufficient documentation

## 2021-02-14 DIAGNOSIS — T8131XA Disruption of external operation (surgical) wound, not elsewhere classified, initial encounter: Secondary | ICD-10-CM | POA: Diagnosis not present

## 2021-02-14 DIAGNOSIS — Z23 Encounter for immunization: Secondary | ICD-10-CM

## 2021-02-14 DIAGNOSIS — L97511 Non-pressure chronic ulcer of other part of right foot limited to breakdown of skin: Secondary | ICD-10-CM | POA: Diagnosis not present

## 2021-02-14 DIAGNOSIS — L97516 Non-pressure chronic ulcer of other part of right foot with bone involvement without evidence of necrosis: Secondary | ICD-10-CM | POA: Insufficient documentation

## 2021-02-14 DIAGNOSIS — Z0000 Encounter for general adult medical examination without abnormal findings: Secondary | ICD-10-CM

## 2021-02-14 DIAGNOSIS — E7800 Pure hypercholesterolemia, unspecified: Secondary | ICD-10-CM

## 2021-02-14 DIAGNOSIS — L97519 Non-pressure chronic ulcer of other part of right foot with unspecified severity: Secondary | ICD-10-CM | POA: Diagnosis not present

## 2021-02-14 DIAGNOSIS — H04123 Dry eye syndrome of bilateral lacrimal glands: Secondary | ICD-10-CM

## 2021-02-14 LAB — LDL CHOLESTEROL, DIRECT: Direct LDL: 74 mg/dL

## 2021-02-14 NOTE — Progress Notes (Addendum)
Established Patient Office Visit  Subjective:  Patient ID: Alex Boyle, male    DOB: August 13, 1945  Age: 75 y.o. MRN: [id number]  CC:  Chief Complaint  Patient presents with   Follow-up    3 month follow up, concerns about left eye being closed and dry every morning symptoms x 1 month.     HPI Alex Boyle presents for follow-up of his elevated ldl cholesterol, ulceration on the toes right foot, left eye dryness and rash on his face.  He is getting a flu shot today.  Is now seeing wound care specialist for the ulceration on his right third toe.  Wound is healing now.  Osteomyelitis was ruled out.  Eyes have been dry.  Particularly the left eye is affected.  It is matted in the morning there is no discharge.  Eyes minimally injected.  Vision is not affected.  He has been seen at dermatology for rash on his face.  The thinking was that he was allergic to his CPAP machine straps.  Is not responded well to betamethasone cream.  He is currently taking doxycycline for his foot ulcer.  He is tolerating the higher dose of Crestor without issue.  Past Medical History:  Diagnosis Date   Allergy    Arthritis    Asthma    COPD (chronic obstructive pulmonary disease) ([place])    boderline per pt   Diverticulitis    Hyperlipidemia    Hypertension    Myocardial infarction ([place])    Sleep apnea    uses bipap   Sleep apnea in adult     Past Surgical History:  Procedure Laterality Date   COLON SURGERY     COLONOSCOPY     CORONARY STENT PLACEMENT     x2   FOOT SURGERY     JOINT REPLACEMENT     knee   LAPAROSCOPIC TRANSABDOMINAL HERNIA     MANDIBLE SURGERY     skin cancer     basil cell removed on back   UPPER GASTROINTESTINAL ENDOSCOPY      Family History  Adopted: Yes  Problem Relation Age of Onset   Uterine cancer Mother    Heart disease Mother    Hypertension Mother    Arthritis Mother    Alzheimer's disease Mother    Colon cancer Neg Hx    Pancreatic cancer Neg Hx     Esophageal cancer Neg Hx    Rectal cancer Neg Hx    Stomach cancer Neg Hx     Social History   Socioeconomic History   Marital status: Married    Spouse name: Not on file   Number of children: 1   Years of education: Not on file   Highest education level: Not on file  Occupational History   Not on file  Tobacco Use   Smoking status: Former    Years: 2.00    Types: Cigarettes   Smokeless tobacco: Never  Vaping Use   Vaping Use: Never used  Substance and Sexual Activity   Alcohol use: Yes    Comment: soically    Drug use: Never   Sexual activity: Yes    Birth control/protection: None  Other Topics Concern   Not on file  Social History Narrative   Not on file   Social Determinants of Health   Financial Resource Strain: Not on file  Food Insecurity: Not on file  Transportation Needs: Not on file  Physical Activity: Not on file  Stress: Not  on file  Social Connections: Not on file  Intimate Partner Violence: Not on file    Outpatient Medications Prior to Visit  Medication Sig Dispense Refill   albuterol (PROVENTIL HFA;VENTOLIN HFA) 108 (90 Base) MCG/ACT inhaler Inhale 1-2 puffs into the lungs every 6 (six) hours as needed for wheezing or shortness of breath. 1 Inhaler 12   ascorbic acid (VITAMIN C) 1000 MG tablet Take by mouth.     B Complex Vitamins (VITAMIN-B COMPLEX PO) Take by mouth.     BREO ELLIPTA 100-25 MCG/INH AEPB INHALE ONE PUFF BY MOUTH ONE TIME DAILY 60 each 6   Cholecalciferol 25 MCG (1000 UT) capsule Take by mouth.     clindamycin (CLEOCIN) 150 MG capsule Indications: treatment to prevent bacterial infection of a heart valve. Take '600mg'$  as directed 1 hour prior to dental work     doxycycline (VIBRAMYCIN) 100 MG capsule Take 100 mg by mouth 2 (two) times daily.     finasteride (PROSCAR) 5 MG tablet Take 1qd (Plz sched appt with new provider for future fills) 90 tablet 1   lisinopril (ZESTRIL) 5 MG tablet Take by mouth.     metoprolol tartrate  (LOPRESSOR) 25 MG tablet TAKE ONE TABLET BY MOUTH TWICE A DAY 180 tablet 1   montelukast (SINGULAIR) 10 MG tablet Take by mouth.     nitroGLYCERIN (NITROSTAT) 0.4 MG SL tablet Place under the tongue.     omeprazole (PRILOSEC) 20 MG capsule Take by mouth 2 (two) times daily before a meal.     rosuvastatin (CRESTOR) 40 MG tablet Take 1 tablet (40 mg total) by mouth daily. 90 tablet 1   tamsulosin (FLOMAX) 0.4 MG CAPS capsule Take 1qd (Plz sched appt with new provider for future fills) 90 capsule 0   Multiple Vitamins-Minerals (MULTIVITAMIN ADULTS 50+ PO) Take by mouth. (Patient not taking: Reported on 02/14/2021)     betamethasone valerate (VALISONE) 0.1 % cream SMARTSIG:1 Application Topical 1-2 Times Daily PRN     No facility-administered medications prior to visit.    Allergies  Allergen Reactions   Codeine Shortness Of Breath   Gramineae Pollens Itching   Penicillins Swelling    ROS Review of Systems  Constitutional: Negative.   HENT: Negative.    Eyes:  Negative for discharge, itching and visual disturbance.  Respiratory: Negative.    Cardiovascular: Negative.   Gastrointestinal: Negative.   Genitourinary:  Positive for difficulty urinating, frequency and urgency.  Skin:  Positive for color change and wound.  Neurological:  Negative for speech difficulty and weakness.  Psychiatric/Behavioral: Negative.       Objective:    Physical Exam Vitals and nursing note reviewed.  Constitutional:      General: He is not in acute distress.    Appearance: Normal appearance. He is not ill-appearing, toxic-appearing or diaphoretic.  HENT:     Head: Normocephalic and atraumatic.     Right Ear: Tympanic membrane, ear canal and external ear normal.     Left Ear: Tympanic membrane, ear canal and external ear normal.     Mouth/Throat:     Mouth: Mucous membranes are moist.     Pharynx: Oropharynx is clear. No oropharyngeal exudate or posterior oropharyngeal erythema.  Eyes:     General:         Right eye: No discharge.        Left eye: No discharge.     Extraocular Movements: Extraocular movements intact.     Conjunctiva/sclera: Conjunctivae normal.  Pupils: Pupils are equal, round, and reactive to light.  Neck:     Vascular: No carotid bruit.  Cardiovascular:     Rate and Rhythm: Normal rate and regular rhythm.  Pulmonary:     Effort: Pulmonary effort is normal.     Breath sounds: Normal breath sounds.  Musculoskeletal:     Cervical back: No rigidity or tenderness.  Lymphadenopathy:     Cervical: No cervical adenopathy.  Skin:    Findings: Erythema (left greater than right side of face.) present.  Neurological:     Mental Status: He is alert.  Psychiatric:        Mood and Affect: Mood normal.    BP 108/66 (BP Location: Right Arm, Patient Position: Sitting, Cuff Size: Normal)   Pulse 61   Temp 97.6 F (36.4 C) (Temporal)   Ht '5\' 11"'$  (1.803 m)   Wt 218 lb 9.6 oz (99.2 kg)   SpO2 96%   BMI 30.49 kg/m  Wt Readings from Last 3 Encounters:  02/14/21 218 lb 9.6 oz (99.2 kg)  01/27/21 224 lb (101.6 kg)  01/02/21 224 lb 8 oz (101.8 kg)     Health Maintenance Due  Topic Date Due   Hepatitis C Screening  Never done   TETANUS/TDAP  Never done   Zoster Vaccines- Shingrix (1 of 2) Never done   PNA vac Low Risk Adult (1 of 2 - PCV13) Never done   INFLUENZA VACCINE  01/09/2021    There are no preventive care reminders to display for this patient.  Lab Results  Component Value Date   TSH 2.34 11/15/2020   Lab Results  Component Value Date   WBC 7.1 12/22/2020   HGB 16.3 12/22/2020   HCT 47.5 12/22/2020   MCV 91.3 12/22/2020   PLT 182 12/22/2020   Lab Results  Component Value Date   NA 139 11/15/2020   K 4.8 11/15/2020   CO2 29 11/15/2020   GLUCOSE 103 (H) 11/15/2020   BUN 17 11/15/2020   CREATININE 0.79 11/15/2020   BILITOT 0.8 11/15/2020   ALKPHOS 57 11/15/2020   AST 25 11/15/2020   ALT 39 11/15/2020   PROT 7.3 11/15/2020   ALBUMIN 4.5  11/15/2020   CALCIUM 9.9 11/15/2020   GFR 87.75 11/15/2020   Lab Results  Component Value Date   CHOL 185 11/15/2020   Lab Results  Component Value Date   HDL 39.90 11/15/2020   Lab Results  Component Value Date   LDLCALC 79 10/31/2018   Lab Results  Component Value Date   TRIG 221.0 (H) 11/15/2020   Lab Results  Component Value Date   CHOLHDL 5 11/15/2020   Lab Results  Component Value Date   HGBA1C 5.9 11/15/2020      Assessment & Plan:   Problem List Items Addressed This Visit       Cardiovascular and Mediastinum   CAD (coronary artery disease)   Relevant Orders   LDL cholesterol, direct     Musculoskeletal and Integument   Ulcer of right foot, limited to breakdown of skin ([place])     Other   Healthcare maintenance   Relevant Orders   Flu vaccine HIGH DOSE PF (Fluzone High dose)   Other Visit Diagnoses     Elevated LDL cholesterol level    -  Primary   Relevant Orders   LDL cholesterol, direct   Dry eyes       Relevant Orders   Ambulatory referral to Ophthalmology   Allergic  reaction, initial encounter       Relevant Orders   Ambulatory referral to Dermatology       No orders of the defined types were placed in this encounter.   Follow-up: Return in about 6 months (around 08/14/2021), or if symptoms worsen or fail to improve.   Rechecking LDL cholesterol today.  Given information on dry eyes and artificial tears referred to ophthalmology for general eye check.  Allergic reaction on face.  Would like a second opinion.  Would consider erysipelas but he is taking doxycycline.  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  Reminded him that there are medicines to help with that.  Happy that he is seeing wound care for his right toe ulceration.  LDL should be in the desired range of 70 or better.  We will follow-up in 6 months. Libby Maw, MD  2.3.23 addendum: patient hing up on [facility] today when he was asked to come in for evaluation  for back pain.

## 2021-02-14 NOTE — Progress Notes (Addendum)
DASH, THORELL ([id number]) Visit Report for 02/14/2021 Chief Complaint Document Details Patient Name: Alex Boyle, Alex Boyle. Date of Service: 02/14/2021 2:00 PM Medical Record Number: [id number] Patient Account Number: [id number] Date of Birth/Sex: 12/26/1945 (75 y.o. M) Treating RN: Dolan Amen Primary Care Provider: Abelino Derrick Other Clinician: Referring Provider: Abelino Derrick Treating Provider/Extender: Skipper Cliche in Treatment: 3 Information Obtained from: Patient Chief Complaint Right 2nd Toe Ulcer Electronic Signature(s) Signed: 02/14/2021 2:23:41 PM By: Worthy Keeler PA-C Entered By: Worthy Keeler on 02/14/2021 14:23:40 Alex Boyle ([id number]) -------------------------------------------------------------------------------- Debridement Details Patient Name: Alex Boyle Date of Service: 02/14/2021 2:00 PM Medical Record Number: [id number] Patient Account Number: [id number] Date of Birth/Sex: 04-08-1946 (75 y.o. M) Treating RN: Dolan Amen Primary Care Provider: Abelino Derrick Other Clinician: Referring Provider: Abelino Derrick Treating Provider/Extender: Skipper Cliche in Treatment: 3 Debridement Performed for Wound #1 Right Toe Second Assessment: Performed By: Physician Tommie Sams., PA-C Debridement Type: Debridement Level of Consciousness (Pre- Awake and Alert procedure): Pre-procedure Verification/Time Out Yes - 14:25 Taken: Start Time: 14:25 Total Area Debrided (L x W): 1 (cm) x 1 (cm) = 1 (cm) Tissue and other material Viable, Non-Viable, Slough, Subcutaneous, Skin: Epidermis, Slough debrided: Level: Skin/Subcutaneous Tissue Debridement Description: Excisional Instrument: Curette Bleeding: Minimum Hemostasis Achieved: Pressure Response to Treatment: Procedure was tolerated well Level of Consciousness (Post- Awake and Alert procedure): Post Debridement Measurements of Total Wound Length: (cm) 0.3 Width: (cm) 0.3 Depth: (cm)  0.3 Volume: (cm) 0.021 Character of Wound/Ulcer Post Debridement: Stable Post Procedure Diagnosis Same as Pre-procedure Electronic Signature(s) Signed: 02/14/2021 4:06:52 PM By: Dolan Amen RN Signed: 02/17/2021 11:43:44 AM By: Worthy Keeler PA-C Entered By: Dolan Amen on 02/14/2021 14:31:17 Alex Boyle ([id number]) -------------------------------------------------------------------------------- HPI Details Patient Name: Alex Boyle Date of Service: 02/14/2021 2:00 PM Medical Record Number: [id number] Patient Account Number: [id number] Date of Birth/Sex: Oct 12, 1945 (75 y.o. M) Treating RN: Dolan Amen Primary Care Provider: Abelino Derrick Other Clinician: Referring Provider: Abelino Derrick Treating Provider/Extender: Skipper Cliche in Treatment: 3 History of Present Illness HPI Description: 01/20/2021 upon evaluation today patient appears to be doing somewhat poorly in regard to a surgery that he had that was actually on 06/10/2020. He tells me that the toe was subsequently a hammertoe and this needed to be corrected by surgical intervention. When they got and the joint was so deteriorated and ended up having to actually pin and fuse the toe. This no longer Britta Mccreedy has given him some trouble he tells me. With that being said he has been seeing [facility], Dr. Sherryle Lis, and the only thing that is really been mentioned is going to try to remove a portion of the toe in order to basically it sounds like refuse this. Nonetheless I really do not know that the patient is interested in going down that road at this point based on what I am hearing. He does have noted culture on 12/22/2020 which was positive for Serratia. Nonetheless he has been on doxycycline though that just ended today. I would probably extend this for him due to the fact that I want him to be on this plenty in order to justify a good trial of antibiotics as well as wound care before potential for  hyperbarics if he does indeed have osteomyelitis noted. The good news is his ABIs are on the right 1.38 with a TBI of 0.79 and on the left 1.21 with a TBI of 0.70. This was on 11/23/2020. Notably  the patient is obviously very frustrated with the situation he has not had an MRI at this point since the surgery. I think that is really what we need to look into doing. Recently well8/22/2022 upon evaluation today patient appears to be doing regard to his toe ulcer. This is definitely not significantly improved compared to previous but is also not doing any worse to be honest. I did review his MRI today as well. With that being said I do not see any signs of definitive osteomyelitis currently. There was some mention of abnormal T1 and T2 signal intensity in the proximal and middle phalange ease of the second toe which could be due to recent surgery/stress reaction although osteomyelitis could not be excluded. Being that the bone at this location seems to be very solid there is no signs of flaking and in general the patient's toe does not appear to be showing signs of significant warmth with the erythema although there is some redness I think this is more just inflammatory as a result of the surgery. My gut feeling is that this does not represent osteomyelitis based on what I am seeing physically as well. The MRI also does not identify any signs of infection anywhere else in the foot which is also good news. In general this is very reassuring in my opinion. All of this was discussed with the patient during the office visit today. 02/06/2021 upon evaluation today patient's toe actually appears to be doing decently well today. There is still bone noted in the base of the toe but in general I think we are making some progress and this seems to be closing in quite nicely. With that being said he is going require little bit of debridement today. Also did discuss with him that we got the results back from the  reaching out to the insurance company about the [place] unfortunately that was denied as a not a covered service. In the meantime I think continuing with the collagen is probably the best way to go. 02/14/2021 upon evaluation today patient appears to be doing well with regard to his toe ulcer. He has been tolerating the dressing changes without complication. Fortunately I do think that though he is doing better and this looks much better no longer erythematous the doxycycline in that regard. Nonetheless I do believe that he is still having some issues here to be honest with some necrotic debris buildup and we are getting need to clean this away. He is in agreement with that plan. Electronic Signature(s) Signed: 02/14/2021 5:11:45 PM By: Worthy Keeler PA-C Entered By: Worthy Keeler on 02/14/2021 17:11:44 Alex Boyle ([id number]) -------------------------------------------------------------------------------- Physical Exam Details Patient Name: Alex Boyle Date of Service: 02/14/2021 2:00 PM Medical Record Number: [id number] Patient Account Number: [id number] Date of Birth/Sex: Aug 26, 1945 (75 y.o. M) Treating RN: Dolan Amen Primary Care Provider: Abelino Derrick Other Clinician: Referring Provider: Abelino Derrick Treating Provider/Extender: Skipper Cliche in Treatment: 3 Constitutional Well-nourished and well-hydrated in no acute distress. Respiratory normal breathing without difficulty. Psychiatric this patient is able to make decisions and demonstrates good insight into disease process. Alert and Oriented x 3. pleasant and cooperative. Notes Upon inspection patient's wound bed actually showed signs of good granulation epithelization in some areas I perform debridement to clear away some of the necrotic debris he tolerated that today without complication this was down to the bone but did not include debridement of the bone. Post debridement wound bed appears to be doing  significantly  better which is great news. Electronic Signature(s) Signed: 02/14/2021 5:12:03 PM By: Worthy Keeler PA-C Entered By: Worthy Keeler on 02/14/2021 17:12:03 Alex Boyle ([id number]) -------------------------------------------------------------------------------- Physician Orders Details Patient Name: Alex Boyle Date of Service: 02/14/2021 2:00 PM Medical Record Number: [id number] Patient Account Number: [id number] Date of Birth/Sex: 21-Sep-1945 (75 y.o. 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 Bathing/ Shower/ Hygiene o May shower; gently cleanse wound with antibacterial soap, rinse and pat dry prior to dressing wounds Edema Control - Lymphedema / Segmental Compressive Device / Other o Elevate leg(s) parallel to the floor when sitting. o DO YOUR BEST to sleep in the bed at night. DO NOT sleep in your recliner. Long hours of sitting in a recliner leads to swelling of the legs and/or potential wounds on your backside. Off-Loading Wound #1 Right Toe Second o Other: - KEEP PRESSURE OFF OF WOUND Medications-Please add to medication list. o P.O. Antibiotics - Extend doxycycline Wound Treatment Wound #1 - Toe Second Wound Laterality: Right Cleanser: Normal Saline Every Other Day/30 Days Discharge Instructions: Wash your hands with soap and water. Remove  old dressing, discard into plastic bag and place into trash. Cleanse the wound with Normal Saline prior to applying a clean dressing using gauze sponges, not tissues or cotton balls. Do not scrub or use excessive force. Pat dry using gauze sponges, not tissue or cotton balls. Primary Dressing: Prisma 4.34 (in) Every Other Day/30 Days Discharge Instructions: Moisten w/normal saline or sterile water; Cover wound as directed. Do not remove from wound bed. Secondary Dressing: Conforming Guaze Roll-Small (Generic) Every Other Day/30 Days Discharge Instructions: [place] as directed Secondary Dressing: Gauze Every Other Day/30 Days Discharge Instructions: Apply over prisma to cover Secured With: 70M [place] Surgical Tape, 2x2 (in/yd) (Generic) Every Other Day/30 Days Discharge Instructions: Secure conform Electronic Signature(s) Signed: 02/14/2021 4:06:52 PM By: Dolan Amen RN Signed: 02/17/2021 11:43:44 AM By: Worthy Keeler PA-C Entered By: Dolan Amen on 02/14/2021 14:31:39 Alex Boyle ([id number]) -------------------------------------------------------------------------------- Problem List Details Patient Name: Alex Boyle Date of Service: 02/14/2021 2:00 PM Medical Record Number: [id number] Patient Account Number: [id number] Date of Birth/Sex: September 07, 1945 (75 y.o. M) Treating RN: Dolan Amen Primary Care Provider: Abelino Derrick Other Clinician: Referring Provider: Abelino Derrick Treating Provider/Extender: Skipper Cliche in Treatment: 3 Active Problems ICD-10 Encounter Code Description Active Date MDM Diagnosis T81.31XA Disruption of external operation (surgical) wound, not elsewhere 01/20/2021 No Yes classified, initial encounter L97.516 Non-pressure chronic ulcer of other part of right foot with bone 01/20/2021 No Yes involvement without evidence of necrosis I10 Essential (primary) hypertension 01/20/2021 No Yes I25.10  Atherosclerotic heart disease of native coronary artery without angina 01/20/2021 No Yes pectoris Inactive Problems Resolved Problems Electronic Signature(s) Signed: 02/14/2021 2:23:34 PM By: Worthy Keeler PA-C Entered By: Worthy Keeler on 02/14/2021 14:23:34 Alex Boyle ([id number]) -------------------------------------------------------------------------------- Progress Note Details Patient Name: Alex Boyle Date of Service: 02/14/2021 2:00 PM Medical Record Number: [id number] Patient Account Number:  [id number] Date of Birth/Sex: 07/29/1945 (75 y.o. M) Treating RN: Dolan Amen Primary Care Provider: Abelino Derrick Other Clinician: Referring Provider: Abelino Derrick Treating Provider/Extender: Skipper Cliche in Treatment: 3 Subjective Chief Complaint Information obtained from Patient Right 2nd Toe Ulcer History of Present Illness (HPI) 01/20/2021 upon evaluation today patient appears to be doing somewhat poorly in regard to a surgery that he had that was actually on 06/10/2020. He tells me that the toe was subsequently a hammertoe and this needed to be corrected by surgical intervention. When they got and the joint was so deteriorated and ended up having to actually pin and fuse the toe. This no longer Britta Mccreedy has given him some trouble he tells me. With that being said he has been seeing [facility], Dr. Sherryle Lis, and the only thing that is really been mentioned is going to try to remove a portion of the toe in order to basically it sounds like refuse this. Nonetheless I really do not know that the patient is interested in going down that road at this point based on what I am hearing. He does have noted culture on 12/22/2020 which was positive for Serratia. Nonetheless he has been on doxycycline though that just ended today. I would probably extend this for him due to the fact that I want him to be on this plenty in order to justify a good trial of antibiotics as well  as wound care before potential for hyperbarics if he does indeed have osteomyelitis noted. The good news is his ABIs are on the right 1.38 with a TBI of 0.79 and on the left 1.21 with a TBI of 0.70. This was on 11/23/2020. Notably the patient is obviously very frustrated with the situation he has not had an MRI at this point since the surgery. I think that is really what we need to look into doing. Recently well8/22/2022 upon evaluation today patient appears to be doing regard to his toe ulcer. This is definitely not significantly improved compared to previous but is also not doing any worse to be honest. I did review his MRI today as well. With that being said I do not see any signs of definitive osteomyelitis currently. There was some mention of abnormal T1 and T2 signal intensity in the proximal and middle phalange ease of the second toe which could be due to recent surgery/stress reaction although osteomyelitis could not be excluded. Being that the bone at this location seems to be very solid there is no signs of flaking and in general the patient's toe does not appear to be showing signs of significant warmth with the erythema although there is some redness I think this is more just inflammatory as a result of the surgery. My gut feeling is that this does not represent osteomyelitis based on what I am seeing physically as well. The MRI also does not identify any signs of infection anywhere else in the foot which is also good news. In general this is very reassuring in my opinion. All of this was discussed with the patient during the office visit today. 02/06/2021 upon evaluation today patient's toe actually appears to be doing decently well today. There is still bone noted in the base of the toe but in general I think we are making some progress and this seems to be closing in quite nicely. With that being said he is going require little bit of debridement today. Also did discuss with him that we  got the results back from  the reaching out to the insurance company about the [place] unfortunately that was denied as a not a covered service. In the meantime I think continuing with the collagen is probably the best way to go. 02/14/2021 upon evaluation today patient appears to be doing well with regard to his toe ulcer. He has been tolerating the dressing changes without complication. Fortunately I do think that though he is doing better and this looks much better no longer erythematous the doxycycline in that regard. Nonetheless I do believe that he is still having some issues here to be honest with some necrotic debris buildup and we are getting need to clean this away. He is in agreement with that plan. Objective Constitutional Well-nourished and well-hydrated in no acute distress. Vitals Time Taken: 2:10 PM, Weight: 220 lbs, Temperature: 98.5 F, Pulse: 72 bpm, Respiratory Rate: 18 breaths/min, Blood Pressure: 123/76 mmHg. Respiratory normal breathing without difficulty. Psychiatric this patient is able to make decisions and demonstrates good insight into disease process. Alert and Oriented x 3. pleasant and cooperative. General Notes: Upon inspection patient's wound bed actually showed signs of good granulation epithelization in some areas I perform debridement to clear away some of the necrotic debris he tolerated that today without complication this was down to the bone but did not include XAVER, HAMES. ([id number]) debridement of the bone. Post debridement wound bed appears to be doing significantly better which is great news. Integumentary (Hair, Skin) Wound #1 status is Open. Original cause of wound was Surgical Injury. The date acquired was: 06/11/2020. The wound has been in treatment 3 weeks. The wound is located on the Right Toe Second. The wound measures 0.2cm length x 0.2cm width x 0.3cm depth; 0.031cm^2 area and 0.009cm^3 volume. There is bone, tendon, and Fat Layer (Subcutaneous  Tissue) exposed. There is no tunneling or undermining noted. There is a medium amount of serosanguineous drainage noted. There is medium (34-66%) red granulation within the wound bed. There is a medium (34- 66%) amount of necrotic tissue within the wound bed including Adherent Slough. 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 There was a Excisional Skin/Subcutaneous Tissue Debridement with a total area of 1 sq cm performed by Tommie Sams., PA-C. With the following instrument(s): Curette to remove Viable and Non-Viable tissue/material. Material removed includes Subcutaneous Tissue, Slough, and Skin: Epidermis. A time out was conducted at 14:25, prior to the start of the procedure. A Minimum amount of bleeding was controlled with Pressure. The procedure was tolerated well. Post Debridement Measurements: 0.3cm length x 0.3cm width x 0.3cm depth; 0.021cm^3 volume. Character of Wound/Ulcer Post Debridement is stable. Post procedure Diagnosis Wound #1: Same as Pre-Procedure Plan Follow-up Appointments: Return Appointment in 1 week. Bathing/ Shower/ Hygiene: May shower; gently cleanse wound with antibacterial soap, rinse and pat dry prior to dressing wounds Edema Control - Lymphedema / Segmental Compressive Device / Other: Elevate leg(s) parallel to the floor when sitting. DO YOUR BEST to sleep in the bed at night. DO NOT sleep in your recliner. Long hours of sitting in a recliner leads to swelling of the legs and/or potential wounds on your backside. Off-Loading: Wound #1 Right  Toe Second:  Other: - KEEP PRESSURE OFF OF WOUND Medications-Please add to medication list.: P.O. Antibiotics - Extend doxycycline WOUND #1: - Toe Second Wound Laterality: Right Cleanser: Normal Saline Every Other Day/30 Days Discharge Instructions: Wash your hands with soap and water. Remove old dressing, discard into plastic bag and place into trash. Cleanse the wound with Normal Saline prior to applying a clean dressing using gauze sponges, not tissues or cotton balls. Do not scrub or use excessive force. Pat dry using gauze sponges, not tissue or cotton balls. Primary Dressing: Prisma 4.34 (in) Every Other Day/30 Days Discharge Instructions: Moisten w/normal saline or sterile water; Cover wound as directed. Do not remove from wound bed. Secondary Dressing: Conforming Guaze Roll-Small (Generic) Every Other Day/30 Days Discharge Instructions: [place] as directed Secondary Dressing: Gauze Every Other Day/30 Days Discharge Instructions: Apply over prisma to cover Secured With: 66M [place] Surgical Tape, 2x2 (in/yd) (Generic) Every Other Day/30 Days Discharge Instructions: Secure conform JERISON, BOCH ([id number]) 1. Would recommend currently that we going continue with the wound care measures as before and the patient is in agreement with the plan. This includes the use of the silver collagen dressing. 2. I am also can recommend that we continue with the roll gauze to secure in place over top of this. 3. I am also can recommend that we have the patient continue to monitor for any signs of worsening from the standpoint of infection though right now I do not see any evidence that anything is worsening in that regard. We will see patient back for reevaluation in 1 week here in the clinic. If anything worsens or changes patient will contact our office for additional recommendations. Electronic Signature(s) Signed: 02/14/2021 5:12:44 PM By: Worthy Keeler PA-C Entered By: Worthy Keeler on 02/14/2021 17:12:44 Alex Boyle ([id number]) -------------------------------------------------------------------------------- SuperBill Details Patient Name: Alex Boyle Date of Service: 02/14/2021 Medical Record Number: [id number] Patient Account Number: [id number] Date of Birth/Sex: 1945-11-17 (75 y.o. 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 I10 Essential (primary) hypertension I25.10 Atherosclerotic heart disease of native coronary artery without angina pectoris Facility Procedures CPT4 Code Description: [id number] - DEB SUBQ TISSUE 20 SQ CM/< Modifier: Quantity: 1 CPT4 Code Description: ICD-10 Diagnosis Description 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

## 2021-02-14 NOTE — Progress Notes (Signed)
SHAMUS, DESANTIS ([id number]) Visit Report for 02/14/2021 Arrival Information Details Patient Name: Alex Boyle, Alex Boyle. Date of Service: 02/14/2021 2:00 PM Medical Record Number: [id number] Patient Account Number: [id number] Date of Birth/Sex: [birth year] (75 y.o. M) Treating RN: Dolan Amen Primary Care Alfard Cochrane: Abelino Derrick Other Clinician: Referring Neira Bentsen: Abelino Derrick Treating Batul Diego/Extender: Skipper Cliche in Treatment: 3 Visit Information History Since Last Visit Pain Present Now: No Patient Arrived: Ambulatory Arrival Time: 14:09 Accompanied By: self Transfer Assistance: None Patient Identification Verified: Yes Secondary Verification Process Completed: Yes Electronic Signature(s) Signed: 02/14/2021 4:06:52 PM By: Dolan Amen RN Entered By: Dolan Amen on 02/14/2021 14:09:48 Alex Boyle ([id number]) -------------------------------------------------------------------------------- Clinic Level of Care Assessment Details Patient Name: Alex Boyle Date of Service: 02/14/2021 2:00 PM Medical Record Number: [id number] Patient Account Number: [id number] Date of Birth/Sex: [birth year] (74 y.o. M) Treating RN: Dolan Amen Primary Care Neil Errickson: Abelino Derrick Other Clinician: Referring Arisha Gervais: Abelino Derrick Treating Nyra Anspaugh/Extender: Skipper Cliche in Treatment: 3 Clinic Level of Care Assessment Items TOOL 1 Quantity Score _0  - Use when EandM and Procedure is performed on INITIAL visit 0 ASSESSMENTS - Nursing Assessment / Reassessment _1  - General Physical Exam (combine w/ comprehensive assessment (listed just below) when performed on new 0 pt. evals) _2  - 0 Comprehensive Assessment (HX, ROS, Risk Assessments, Wounds Hx, etc.) ASSESSMENTS - Wound and Skin Assessment / Reassessment _3  - Dermatologic / Skin Assessment (not related to wound area) 0 ASSESSMENTS - Ostomy and/or Continence Assessment and Care _4  - Incontinence Assessment and Management  0 _5  - 0 Ostomy Care Assessment and Management (repouching, etc.) PROCESS - Coordination of Care _6  - Simple Patient / Family Education for ongoing care 0 _7  - 0 Complex (extensive) Patient / Family Education for ongoing care _8  - 0 Staff obtains Programmer, systems, Records, Test Results / Process Orders _9  - 0 Staff telephones HHA, Nursing Homes / Clarify orders / etc _10  - 0 Routine Transfer to another Facility (non-emergent condition) _11  - 0 Routine Hospital Admission (non-emergent condition) _12  - 0 New Admissions / Biomedical engineer / Ordering NPWT, Apligraf, etc. _13  - 0 Emergency Hospital Admission (emergent condition) PROCESS - Special Needs _14  - Pediatric / Minor Patient Management 0 _15  - 0 Isolation Patient Management _16  - 0 Hearing / Language / Visual special needs _17  - 0 Assessment of Community assistance (transportation, D/C planning, etc.) _18  - 0 Additional assistance / Altered mentation _19  - 0 Support Surface(s) Assessment (bed, cushion, seat, etc.) INTERVENTIONS - Miscellaneous _20  - External ear exam 0 _21  - 0 Patient Transfer (multiple staff / Civil Service fast streamer / Similar devices) _22  - 0 Simple Staple / Suture removal (25 or less) _23  - 0 Complex Staple / Suture removal (26 or more) _24  - 0 Hypo/Hyperglycemic Management (do not check if billed separately) _25  - 0 Ankle / Brachial Index (ABI) - do not check if billed separately Has the patient been seen at the hospital within the last three years: Yes Total Score: 0 Level Of Care: ____ Alex Boyle ([id number]) Electronic Signature(s) Signed: 02/14/2021 4:06:52 PM By: Dolan Amen RN Entered By: Dolan Amen on 02/14/2021 14:32:32 Alex Boyle ([id number]) -------------------------------------------------------------------------------- Encounter Discharge Information Details Patient Name: Alex Boyle Date of Service: 02/14/2021 2:00 PM Medical Record Number: [id number] Patient Account Number:  [id number] Date of Birth/Sex: September 18, 1945 (75 y.o. M) Treating RN: Dolan Amen Primary Care Zonya Gudger: Abelino Derrick Other Clinician: Referring Manroop Jakubowicz: Abelino Derrick Treating Lesslie Mckeehan/Extender: Skipper Cliche in Treatment: 3 Encounter Discharge  Information Items Post Procedure Vitals Discharge Condition: Stable Temperature (F): 98.5 Ambulatory Status: Ambulatory Pulse (bpm): 72 Discharge Destination: Home Respiratory Rate (breaths/min): 18 Transportation: Private Auto Blood Pressure (mmHg): 123/76 Accompanied By: self Schedule Follow-up Appointment: Yes Clinical Summary of Care: Electronic Signature(s) Signed: 02/14/2021 4:06:52 PM By: Dolan Amen RN Entered By: Dolan Amen on 02/14/2021 14:38:58 Alex Boyle ([id number]) -------------------------------------------------------------------------------- Lower Extremity Assessment Details Patient Name: Alex Boyle Date of Service: 02/14/2021 2:00 PM Medical Record Number: [id number] Patient Account Number: [id number] Date of Birth/Sex: 10-15-1945 (75 y.o. M) Treating RN: Dolan Amen Primary Care Avalee Castrellon: Abelino Derrick Other Clinician: Referring Masiah Lewing: Abelino Derrick Treating Briell Paulette/Extender: Jeri Cos Weeks in Treatment: 3 Edema Assessment Assessed: [Left: No] [Right: Yes] Edema: [Left: N] [Right: o] Vascular Assessment Pulses: Dorsalis Pedis Palpable: [Right:Yes] Electronic Signature(s) Signed: 02/14/2021 4:06:52 PM By: Dolan Amen RN Entered By: Dolan Amen on 02/14/2021 14:18:44 Alex Boyle ([id number]) -------------------------------------------------------------------------------- Multi Wound Chart Details Patient Name: Alex Boyle Date of Service: 02/14/2021 2:00 PM Medical Record Number: [id number] Patient Account Number: [id number] Date of Birth/Sex: 06-05-1946 (75 y.o. M) Treating RN: Dolan Amen Primary Care Deyonte Cadden: Abelino Derrick Other Clinician: Referring  Larson Limones: Abelino Derrick Treating Monie Shere/Extender: Skipper Cliche in Treatment: 3 Vital Signs Height(in): Pulse(bpm): 72 Weight(lbs): 027 Blood Pressure(mmHg): 123/76 Body Mass Index(BMI): Temperature(F): 98.5 Respiratory Rate(breaths/min): 18 Photos: [N/A:N/A] Wound Location: Right Toe Second N/A N/A Wounding Event: Surgical Injury N/A N/A Primary Etiology: Open Surgical Wound N/A N/A Comorbid History: Asthma, Sleep Apnea, N/A N/A Hypertension, Myocardial Infarction, Neuropathy Date Acquired: 06/11/2020 N/A N/A Weeks of Treatment: 3 N/A N/A Wound Status: Open N/A N/A Pending Amputation on Yes N/A N/A Presentation: Measurements L x W x D (cm) 0.2x0.2x0.3 N/A N/A Area (cm) : 0.031 N/A N/A Volume (cm) : 0.009 N/A N/A % Reduction in Area: 84.20% N/A N/A % Reduction in Volume: 88.60% N/A N/A Classification: Full Thickness With Exposed N/A N/A Support Structures Exudate Amount: Medium N/A N/A Exudate Type: Serosanguineous N/A N/A Exudate Color: red, brown N/A N/A Granulation Amount: Medium (34-66%) N/A N/A Granulation Quality: Red N/A N/A Necrotic Amount: Medium (34-66%) N/A N/A Exposed Structures: Fat Layer (Subcutaneous Tissue): N/A N/A Yes Tendon: Yes Bone: Yes Fascia: No Muscle: No Joint: No Epithelialization: Small (1-33%) N/A N/A Treatment Notes Electronic Signature(s) Signed: 02/14/2021 4:06:52 PM By: Dolan Amen RN Entered By: Dolan Amen on 02/14/2021 14:25:01 Alex Boyle ([id number]) -------------------------------------------------------------------------------- Multi-Disciplinary Care Plan Details Patient Name: Alex Boyle Date of Service: 02/14/2021 2:00 PM Medical Record Number: [id number] Patient Account Number: [id number] Date of Birth/Sex: 28-May-1946 (75 y.o. M) Treating RN: Dolan Amen Primary Care Amil Moseman: Abelino Derrick Other Clinician: Referring Maansi Wike: Abelino Derrick Treating Faduma Cho/Extender: Skipper Cliche in  Treatment: 3 Active Inactive Wound/Skin Impairment Nursing Diagnoses: Impaired tissue integrity Goals: Patient/caregiver will verbalize understanding of skin care regimen Date Initiated: 01/20/2021 Date Inactivated: 02/14/2021 Target Resolution Date: 01/20/2021 Goal Status: Met Ulcer/skin breakdown will have a volume reduction of 30% by week 4 Date Initiated: 01/20/2021 Target Resolution Date: 02/20/2021 Goal Status: Active Ulcer/skin breakdown will have a volume reduction of 50% by week 8 Date Initiated: 01/20/2021 Target Resolution Date: 03/22/2021 Goal Status: Active Ulcer/skin breakdown will have a volume reduction of 80% by week 12 Date Initiated: 01/20/2021 Target Resolution Date: 04/22/2021 Goal Status: Active Ulcer/skin breakdown will heal within 14 weeks Date Initiated: 01/20/2021 Target Resolution Date: 05/22/2021 Goal Status: Active Interventions: Assess patient/caregiver ability to obtain necessary supplies Assess patient/caregiver ability to perform ulcer/skin  care regimen upon admission and as needed Assess ulceration(s) every visit Provide education on ulcer and skin care Treatment Activities: Skin care regimen initiated : 01/20/2021 Notes: Electronic Signature(s) Signed: 02/14/2021 4:06:52 PM By: Dolan Amen RN Entered By: Dolan Amen on 02/14/2021 14:24:03 Alex Boyle ([id number]) -------------------------------------------------------------------------------- Pain Assessment Details Patient Name: Alex Boyle Date of Service: 02/14/2021 2:00 PM Medical Record Number: [id number] Patient Account Number: [id number] Date of Birth/Sex: 11/17/1945 (75 y.o. M) Treating RN: Dolan Amen Primary Care Milania Haubner: Abelino Derrick Other Clinician: Referring Rasheema Truluck: Abelino Derrick Treating Tahjae Clausing/Extender: Skipper Cliche in Treatment: 3 Active Problems Location of Pain Severity and Description of Pain Patient Has Paino No Site Locations Rate the  pain. Current Pain Level: 0 Pain Management and Medication Current Pain Management: Electronic Signature(s) Signed: 02/14/2021 4:06:52 PM By: Dolan Amen RN Entered By: Dolan Amen on 02/14/2021 14:12:15 Alex Boyle ([id number]) -------------------------------------------------------------------------------- Patient/Caregiver Education Details Patient Name: Alex Boyle Date of Service: 02/14/2021 2:00 PM Medical Record Number: [id number] Patient Account Number: [id number] Date of Birth/Gender: 1945/10/06 (75 y.o. M) Treating RN: Dolan Amen Primary Care Physician: Abelino Derrick Other Clinician: Referring Physician: Abelino Derrick Treating Physician/Extender: Skipper Cliche in Treatment: 3 Education Assessment Education Provided To: Patient Education Topics Provided Wound/Skin Impairment: Methods: Explain/Verbal Responses: State content correctly Electronic Signature(s) Signed: 02/14/2021 4:06:52 PM By: Dolan Amen RN Entered By: Dolan Amen on 02/14/2021 14:33:28 Alex Boyle ([id number]) -------------------------------------------------------------------------------- Wound Assessment Details Patient Name: Alex Boyle Date of Service: 02/14/2021 2:00 PM Medical Record Number: [id number] Patient Account Number: [id number] Date of Birth/Sex: September 12, 1945 (75 y.o. M) Treating RN: Dolan Amen Primary Care Mida Cory: Abelino Derrick Other Clinician: Referring Sarahlynn Cisnero: Abelino Derrick Treating Hani Patnode/Extender: Jeri Cos Weeks in Treatment: 3 Wound Status Wound Number: 1 Primary Open Surgical Wound Etiology: Wound Location: Right Toe Second Wound Status: Open Wounding Event: Surgical Injury Comorbid Asthma, Sleep Apnea, Hypertension, Myocardial Date Acquired: 06/11/2020 History: Infarction, Neuropathy Weeks Of Treatment: 3 Clustered Wound: No Pending Amputation On Presentation Photos Wound Measurements Length: (cm) 0.2 Width: (cm)  0.2 Depth: (cm) 0.3 Area: (cm) 0.031 Volume: (cm) 0.009 % Reduction in Area: 84.2% % Reduction in Volume: 88.6% Epithelialization: Small (1-33%) Tunneling: No Undermining: No Wound Description Classification: Full Thickness With Exposed Support Structures Exudate Amount: Medium Exudate Type: Serosanguineous Exudate Color: red, brown Foul Odor After Cleansing: No Slough/Fibrino Yes Wound Bed Granulation Amount: Medium (34-66%) Exposed Structure Granulation Quality: Red Fascia Exposed: No Necrotic Amount: Medium (34-66%) Fat Layer (Subcutaneous Tissue) Exposed: Yes Necrotic Quality: Adherent Slough Tendon Exposed: Yes Muscle Exposed: No Joint Exposed: No Bone Exposed: Yes Treatment Notes Wound #1 (Toe Second) Wound Laterality: Right Cleanser Normal Saline Discharge Instruction: Wash your hands with soap and water. Remove old dressing, discard into plastic bag and place into trash. Cleanse the wound with Normal Saline prior to applying a clean dressing using gauze sponges, not tissues or cotton balls. Do not scrub or use excessive force. Pat dry using gauze sponges, not tissue or cotton balls. NUMAIR, MASDEN ([id number]) Peri-Wound Care Topical Primary Dressing Prisma 4.34 (in) Discharge Instruction: Moisten w/normal saline or sterile water; Cover wound as directed. Do not remove from wound bed. Secondary Dressing Conforming Guaze Roll-Small Discharge Instruction: Apply Conforming Stretch Guaze Bandage as directed Gauze Discharge Instruction: Apply over prisma to cover Secured With 35M [employer] Surgical Tape, 2x2 (in/yd) Discharge Instruction: Secure conform Compression Wrap Compression Stockings Add-Ons Electronic Signature(s) Signed: 02/14/2021 4:06:52 PM By: Laurin Coder ,  Kenia RN Entered By: Dolan Amen on 02/14/2021 14:17:50 Alex Boyle ([id number]) -------------------------------------------------------------------------------- Vitals  Details Patient Name: Alex Boyle Date of Service: 02/14/2021 2:00 PM Medical Record Number: [id number] Patient Account Number: [id number] Date of Birth/Sex: 07/09/1945 (75 y.o. M) Treating RN: Dolan Amen Primary Care Harinder Romas: Abelino Derrick Other Clinician: Referring Zalan Shidler: Abelino Derrick Treating Arely Tinner/Extender: Skipper Cliche in Treatment: 3 Vital Signs Time Taken: 14:10 Temperature (F): 98.5 Weight (lbs): 220 Pulse (bpm): 72 Respiratory Rate (breaths/min): 18 Blood Pressure (mmHg): 123/76 Reference Range: 80 - 120 mg / dl Electronic Signature(s) Signed: 02/14/2021 4:06:52 PM By: Dolan Amen RN Entered By: Dolan Amen on 02/14/2021 14:12:00

## 2021-02-20 ENCOUNTER — Encounter: Payer: Federal, State, Local not specified - PPO | Admitting: Physician Assistant

## 2021-02-20 ENCOUNTER — Other Ambulatory Visit: Payer: Self-pay

## 2021-02-20 DIAGNOSIS — T8131XA Disruption of external operation (surgical) wound, not elsewhere classified, initial encounter: Secondary | ICD-10-CM | POA: Diagnosis not present

## 2021-02-20 DIAGNOSIS — I10 Essential (primary) hypertension: Secondary | ICD-10-CM | POA: Diagnosis not present

## 2021-02-20 DIAGNOSIS — I2510 Atherosclerotic heart disease of native coronary artery without angina pectoris: Secondary | ICD-10-CM | POA: Diagnosis not present

## 2021-02-20 DIAGNOSIS — L97519 Non-pressure chronic ulcer of other part of right foot with unspecified severity: Secondary | ICD-10-CM | POA: Diagnosis not present

## 2021-02-20 DIAGNOSIS — L97516 Non-pressure chronic ulcer of other part of right foot with bone involvement without evidence of necrosis: Secondary | ICD-10-CM | POA: Diagnosis not present

## 2021-02-20 NOTE — Progress Notes (Addendum)
TALBERT, BERRIMAN ([id number]) Visit Report for 02/20/2021 Chief Complaint Document Details Patient Name: Alex Boyle, Alex Boyle. Date of Service: 02/20/2021 12:30 PM Medical Record Number: [id number] Patient Account Number: [id number] Date of Birth/Sex: 1946/03/03 (75 y.o. M) Treating RN: Donnamarie Poag Primary Care Provider: Abelino Derrick Other Clinician: Referring Provider: Abelino Derrick Treating Provider/Extender: Skipper Cliche in Treatment: 4 Information Obtained from: Patient Chief Complaint Right 2nd Toe Ulcer Electronic Signature(s) Signed: 02/20/2021 1:10:29 PM By: Worthy Keeler PA-C Entered By: Worthy Keeler on 02/20/2021 13:10:28 Alex Boyle ([id number]) -------------------------------------------------------------------------------- Debridement Details Patient Name: Alex Boyle Date of Service: 02/20/2021 12:30 PM Medical Record Number: [id number] Patient Account Number: [id number] Date of Birth/Sex: 09/21/1945 (75 y.o. M) Treating RN: Donnamarie Poag Primary Care Provider: Abelino Derrick Other Clinician: Referring Provider: Abelino Derrick Treating Provider/Extender: Skipper Cliche in Treatment: 4 Debridement Performed for Wound #1 Right Toe Second Assessment: Performed By: Physician Tommie Sams., PA-C Debridement Type: Debridement Level of Consciousness (Pre- Awake and Alert procedure): Pre-procedure Verification/Time Out Yes - 13:13 Taken: Start Time: 13:13 Pain Control: Lidocaine Total Area Debrided (L x W): 0.2 (cm) x 0.2 (cm) = 0.04 (cm) Tissue and other material Viable, Non-Viable, Slough, Subcutaneous, Biofilm, Slough debrided: Level: Skin/Subcutaneous Tissue Debridement Description: Excisional Instrument: Curette Bleeding: Minimum Hemostasis Achieved: Pressure Response to Treatment: Procedure was tolerated well Level of Consciousness (Post- Awake and Alert procedure): Post Debridement Measurements of Total Wound Length: (cm) 0.2 Width:  (cm) 0.2 Depth: (cm) 0.3 Volume: (cm) 0.009 Character of Wound/Ulcer Post Debridement: Improved Post Procedure Diagnosis Same as Pre-procedure Electronic Signature(s) Signed: 02/20/2021 3:39:01 PM By: Donnamarie Poag Signed: 02/20/2021 6:07:16 PM By: Worthy Keeler PA-C Entered By: Donnamarie Poag on 02/20/2021 13:16:38 Alex Boyle ([id number]) -------------------------------------------------------------------------------- HPI Details Patient Name: Alex Boyle Date of Service: 02/20/2021 12:30 PM Medical Record Number: [id number] Patient Account Number: [id number] Date of Birth/Sex: 1945-10-18 (75 y.o. M) Treating RN: Donnamarie Poag Primary Care Provider: Abelino Derrick Other Clinician: Referring Provider: Abelino Derrick Treating Provider/Extender: Skipper Cliche in Treatment: 4 History of Present Illness HPI Description: 01/20/2021 upon evaluation today patient appears to be doing somewhat poorly in regard to a surgery that he had that was actually on 06/10/2020. He tells me that the toe was subsequently a hammertoe and this needed to be corrected by surgical intervention. When they got and the joint was so deteriorated and ended up having to actually pin and fuse the toe. This no longer Britta Mccreedy has given him some trouble he tells me. With that being said he has been seeing [facility], Dr. Sherryle Lis, and the only thing that is really been mentioned is going to try to remove a portion of the toe in order to basically it sounds like refuse this. Nonetheless I really do not know that the patient is interested in going down that road at this point based on what I am hearing. He does have noted culture on 12/22/2020 which was positive for Serratia. Nonetheless he has been on doxycycline though that just ended today. I would probably extend this for him due to the fact that I want him to be on this plenty in order to justify a good trial of antibiotics as well as wound care before  potential for hyperbarics if he does indeed have osteomyelitis noted. The good news is his ABIs are on the right 1.38 with a TBI of 0.79 and on the left 1.21 with a TBI of 0.70. This was on 11/23/2020. Notably the  patient is obviously very frustrated with the situation he has not had an MRI at this point since the surgery. I think that is really what we need to look into doing. Recently well8/22/2022 upon evaluation today patient appears to be doing regard to his toe ulcer. This is definitely not significantly improved compared to previous but is also not doing any worse to be honest. I did review his MRI today as well. With that being said I do not see any signs of definitive osteomyelitis currently. There was some mention of abnormal T1 and T2 signal intensity in the proximal and middle phalange ease of the second toe which could be due to recent surgery/stress reaction although osteomyelitis could not be excluded. Being that the bone at this location seems to be very solid there is no signs of flaking and in general the patient's toe does not appear to be showing signs of significant warmth with the erythema although there is some redness I think this is more just inflammatory as a result of the surgery. My gut feeling is that this does not represent osteomyelitis based on what I am seeing physically as well. The MRI also does not identify any signs of infection anywhere else in the foot which is also good news. In general this is very reassuring in my opinion. All of this was discussed with the patient during the office visit today. 02/06/2021 upon evaluation today patient's toe actually appears to be doing decently well today. There is still bone noted in the base of the toe but in general I think we are making some progress and this seems to be closing in quite nicely. With that being said he is going require little bit of debridement today. Also did discuss with him that we got the results back  from the reaching out to the insurance company about the [place] unfortunately that was denied as a not a covered service. In the meantime I think continuing with the collagen is probably the best way to go. 02/14/2021 upon evaluation today patient appears to be doing well with regard to his toe ulcer. He has been tolerating the dressing changes without complication. Fortunately I do think that though he is doing better and this looks much better no longer erythematous the doxycycline in that regard. Nonetheless I do believe that he is still having some issues here to be honest with some necrotic debris buildup and we are getting need to clean this away. He is in agreement with that plan. 02/20/2021 upon evaluation today patient's toe actually again showed signs of the collagen being somewhat dried into the wound trapping fluid underneath. I think this is actually more detrimental than beneficial. There is some additional granulation today which is good news but in general I feel like the biggest issue is that we need to do what we can to try to prevent having ongoing and continued issues with this occurring. For that reason I think switching to [facility] Blue rope that we can put a small piece into the wound and then moisten would be the ideal thing to fill the space and prevent fluid trapping. Electronic Signature(s) Signed: 02/20/2021 2:24:45 PM By: Worthy Keeler PA-C Entered By: Worthy Keeler on 02/20/2021 14:24:44 Alex Boyle ([id number]) -------------------------------------------------------------------------------- Physical Exam Details Patient Name: Alex Boyle Date of Service: 02/20/2021 12:30 PM Medical Record Number: [id number] Patient Account Number: [id number] Date of Birth/Sex: 1946-05-20 (75 y.o. M) Treating RN: Donnamarie Poag Primary Care Provider: Abelino Derrick Other  Clinician: Referring Provider: Abelino Derrick Treating Provider/Extender: Skipper Cliche in Treatment:  4 Constitutional Well-nourished and well-hydrated in no acute distress. Respiratory normal breathing without difficulty. Psychiatric this patient is able to make decisions and demonstrates good insight into disease process. Alert and Oriented x 3. pleasant and cooperative. Notes Upon inspection patient's wound bed actually showed signs of good granulation epithelization at this point. Fortunately there does not appear to be any signs of infection I did perform debridement to clear away some of the necrotic debris and residual dressing material that is trapping fluid here. Overall the patient tolerated this today without complication and postdebridement wound bed appears to be doing much better which is great news. I do feel like there is additional granulation and less bone exposed although that we still have some work to do in this regard. Electronic Signature(s) Signed: 02/20/2021 2:25:36 PM By: Worthy Keeler PA-C Entered By: Worthy Keeler on 02/20/2021 14:25:36 Alex Boyle ([id number]) -------------------------------------------------------------------------------- Physician Orders Details Patient Name: Alex Boyle Date of Service: 02/20/2021 12:30 PM Medical Record Number: [id number] Patient Account Number: [id number] Date of Birth/Sex: [birth year] (75 y.o. 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 Bathing/ Shower/ Hygiene o May shower; gently cleanse wound with antibacterial soap, rinse and pat dry prior to dressing wounds o No tub bath. Edema Control - Lymphedema / Segmental Compressive Device / Other o Elevate leg(s) parallel to the floor when sitting. o DO YOUR BEST to sleep in the bed at night. DO NOT sleep in your recliner. Long hours of sitting in a recliner leads to swelling of the legs and/or potential wounds on your backside. Off-Loading Wound #1 Right Toe Second o Other: - KEEP PRESSURE OFF OF WOUND Medications-Please add to medication list. o P.O. Antibiotics - Extend doxycycline Wound Treatment Wound #1 - Toe Second Wound Laterality: Right Cleanser: Normal Saline Every Other Day/30 Days Discharge Instructions: Wash your hands with soap and water. Remove old dressing, discard into plastic bag and place into trash. Cleanse the wound with Normal Saline prior to applying a clean dressing using gauze sponges, not tissues or cotton balls. Do not scrub or use excessive force. Pat dry using gauze sponges, not tissue or cotton balls. Primary Dressing: Hydrofera Blue Classic Foam Rope Dressing, 9x6 (mm/in) Every Other Day/30 Days Discharge Instructions: cut the dressing to fit, then moisten with saline after placing it into the wound Secondary Dressing: Conforming Guaze Roll-Small (Generic) Every Other Day/30 Days Discharge Instructions: [place] as directed Secondary Dressing: Gauze Every Other Day/30 Days Discharge Instructions: Apply over prisma to cover Secured With: 33M [place] Surgical Tape, 2x2 (in/yd) (Generic) Every Other Day/30 Days Discharge Instructions: Secure conform Electronic Signature(s) Signed: 02/20/2021 3:39:01 PM By: Donnamarie Poag Signed: 02/20/2021 6:07:16 PM By: Worthy Keeler PA-C Entered By: Donnamarie Poag on 02/20/2021 13:18:50 Alex Boyle  ([id number]) -------------------------------------------------------------------------------- Problem List Details Patient Name: Alex Boyle Date of Service: 02/20/2021 12:30 PM Medical Record Number: [id number] Patient Account Number: [id number] Date  of Birth/Sex: 03-20-1946 (75 y.o. 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 M) Treating RN: Donnamarie Poag Primary Care Provider: Abelino Derrick Other Clinician: Referring Provider: Abelino Derrick Treating Provider/Extender: Skipper Cliche in Treatment: 4 Subjective Chief Complaint Information obtained from Patient Right 2nd Toe Ulcer History of Present Illness (HPI) 01/20/2021 upon evaluation today patient appears to be doing somewhat poorly in regard to a surgery that he had that was actually on 06/10/2020. He tells me that the toe was  subsequently a hammertoe and this needed to be corrected by surgical intervention. When they got and the joint was so deteriorated and ended up having to actually pin and fuse the toe. This no longer Britta Mccreedy has given him some trouble he tells me. With that being said he has been seeing [facility], Dr. Sherryle Lis, and the only thing that is really been mentioned is going to try to remove a portion of the toe in order to basically it sounds like refuse this. Nonetheless I really do not know that the patient is interested in going down that road at this point based on what I am hearing. He does have noted culture on 12/22/2020 which was positive for Serratia. Nonetheless he has been on doxycycline though that just ended today. I would probably extend this for him due to the fact that I want him to be on this plenty in order to justify a good trial of antibiotics as well as wound care before potential for hyperbarics if he does indeed have osteomyelitis noted. The good news is his ABIs are on the right 1.38 with a TBI of 0.79 and on the left 1.21 with a TBI of 0.70. This was on 11/23/2020. Notably the patient is obviously very frustrated with the situation he has not had an MRI at this point since the surgery. I think that is really what we need to look into doing. Recently well8/22/2022 upon evaluation today patient appears to be doing regard to his toe ulcer. This is definitely not significantly improved compared to previous but is also not doing any worse to be honest. I did review his MRI today as well. With that being said I do not see any signs of definitive osteomyelitis currently. There was some mention of abnormal T1 and T2 signal intensity in the proximal and middle phalange ease of the second toe which could be due to recent surgery/stress reaction although osteomyelitis could not be excluded. Being that the bone at this location seems to be very solid there is no signs of flaking and in  general the patient's toe does not appear to be showing signs of significant warmth with the erythema although there is some  redness I think this is more just inflammatory as a result of the surgery. My gut feeling is that this does not represent osteomyelitis based on what I am seeing physically as well. The MRI also does not identify any signs of infection anywhere else in the foot which is also good news. In general this is very reassuring in my opinion. All of this was discussed with the patient during the office visit today. 02/06/2021 upon evaluation today patient's toe actually appears to be doing decently well today. There is still bone noted in the base of the toe but in general I think we are making some progress and this seems to be closing in quite nicely. With that being said he is going require little bit of debridement today. Also did discuss with him that we got the results back from the reaching out to the insurance company about the [place] unfortunately that was denied as a not a covered service. In the meantime I think continuing with the collagen is probably the best way to go. 02/14/2021 upon evaluation today patient appears to be doing well with regard to his toe ulcer. He has been tolerating the dressing changes without complication. Fortunately I do think that though he is doing better and this looks much better no longer erythematous the doxycycline in that regard. Nonetheless I do believe that he is still having some issues here to be honest with some necrotic debris buildup and we are getting need to clean this away. He is in agreement with that plan. 02/20/2021 upon evaluation today patient's toe actually again showed signs of the collagen being somewhat dried into the wound trapping fluid underneath. I think this is actually more detrimental than beneficial. There is some additional granulation today which is good news but in general I feel like the biggest issue is that we  need to do what we can to try to prevent having ongoing and continued issues with this occurring. For that reason I think switching to [facility] Blue rope that we can put a small piece into the wound and then moisten would be the ideal thing to fill the space and prevent fluid trapping. Objective Constitutional Well-nourished and well-hydrated in no acute distress. Vitals Time Taken: 12:47 PM, Weight: 220 lbs, Temperature: 98.4 F, Pulse: 69 bpm, Respiratory Rate: 16 breaths/min, Blood Pressure: 124/79 mmHg. Respiratory normal breathing without difficulty. Alex Boyle, Alex Boyle ([id number]) Psychiatric this patient is able to make decisions and demonstrates good insight into disease process. Alert and Oriented x 3. pleasant and cooperative. General Notes: Upon inspection patient's wound bed actually showed signs of good granulation epithelization at this point. Fortunately there does not appear to be any signs of infection I did perform debridement to clear away some of the necrotic debris and residual dressing material that is trapping fluid here. Overall the patient tolerated this today without complication and postdebridement wound bed appears to be doing much better which is great news. I do feel like there is additional granulation and less bone exposed although that we still have some work to do in this regard. Integumentary (Hair, Skin) Wound #1 status is Open. Original cause of wound was Surgical Injury. The date acquired was: 06/11/2020. The wound has been in treatment 4 weeks. The wound is located on the Right Toe Second. The wound measures 0.2cm length x 0.2cm width x 0.1cm depth; 0.031cm^2 area and 0.003cm^3 volume. There is bone, tendon, and Fat Layer (Subcutaneous Tissue) exposed. There is no tunneling or undermining noted.  There is a medium amount of serosanguineous drainage noted. There is no granulation within the wound bed. There is a large (67-100%) amount of necrotic tissue within  the wound bed including Eschar and Adherent Slough. 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 There was a Excisional Skin/Subcutaneous Tissue Debridement with a total area of 0.04 sq cm performed by Tommie Sams., PA-C. With the following instrument(s): Curette to remove Viable and Non-Viable tissue/material. Material removed includes Subcutaneous Tissue, Slough, and Biofilm after achieving pain control using Lidocaine. A time out was conducted at 13:13, prior to the start of the procedure. A Minimum amount of bleeding was controlled with Pressure. The procedure was tolerated well. Post Debridement Measurements: 0.2cm length x 0.2cm width x 0.3cm depth; 0.009cm^3 volume. Character of Wound/Ulcer Post Debridement is improved. Post procedure Diagnosis Wound #1: Same as Pre-Procedure Plan Follow-up Appointments: Return Appointment in 1 week. Bathing/ Shower/ Hygiene: May shower; gently cleanse wound with antibacterial soap, rinse and pat dry prior to dressing wounds No tub bath. Edema Control - Lymphedema / Segmental Compressive Device / Other: Elevate leg(s) parallel to the floor when sitting. DO YOUR BEST to sleep in the bed at night. DO NOT sleep in your recliner. Long hours of sitting in a recliner leads to swelling of the legs and/or potential wounds on your backside. Off-Loading: Wound #1 Right Toe Second: Other: - KEEP PRESSURE OFF OF WOUND Medications-Please add to medication list.: P.O. Antibiotics - Extend doxycycline WOUND #1: - Toe Second Wound Laterality:  Right Cleanser: Normal Saline Every Other Day/30 Days Discharge Instructions: Wash your hands with soap and water. Remove old dressing, discard into plastic bag and place into trash. Cleanse the wound with Normal Saline prior to applying a clean dressing using gauze sponges, not tissues or cotton balls. Do not scrub or use excessive force. Pat dry using gauze sponges, not tissue or cotton balls. Primary Dressing: Hydrofera Blue Classic Foam Rope Dressing, 9x6 (mm/in) Every Other Day/30 Days Discharge Instructions: cut the dressing to fit, then moisten with saline after placing it into the wound Secondary Dressing: Conforming Guaze Roll-Small (Generic) Every Other Day/30 Days Discharge Instructions: [place] as directed Alex Boyle, Alex Boyle ([id number]) Secondary Dressing: Gauze Every Other Day/30 Days Discharge Instructions: Apply over prisma to cover Secured With: 79M [place] Surgical Tape, 2x2 (in/yd) (Generic) Every Other Day/30 Days Discharge Instructions: Secure conform 1. Would recommend currently that we going to continue with wound care measures as before and the patient is in agreement with the plan this includes the use of the dressing changes every other day although working to switch to [facility] in place of the silver collagen. 2. I am also can recommend that we have the patient continue with the gauze wrapping to secure everything in place. 3. I am also can recommend that we have the patient continue to change this on a regular basis monitoring for any signs of infection or worsening though the toe appears to be doing significantly better at this point which is great news. We will see patient back for reevaluation in 1 week here in the clinic. If anything worsens or changes  patient will contact our office for additional recommendations. Electronic Signature(s) Signed: 02/20/2021 2:26:23 PM By: Worthy Keeler PA-C Entered By: Worthy Keeler on 02/20/2021 14:26:23 Alex Boyle ([id number]) -------------------------------------------------------------------------------- SuperBill Details Patient Name: Alex Boyle Date of Service: 02/20/2021 Medical Record Number: [id number] Patient Account Number: [id number] Date of Birth/Sex: [birth year] (75 y.o. 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 I10 Essential (primary) hypertension I25.10 Atherosclerotic heart disease of native coronary artery without angina pectoris Facility Procedures CPT4 Code Description: [id number] - DEB SUBQ TISSUE 20 SQ CM/< Modifier: Quantity: 1 CPT4 Code Description: ICD-10 Diagnosis Description 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

## 2021-02-20 NOTE — Progress Notes (Signed)
CASHTON, HOSLEY ([id number]) Visit Report for 02/20/2021 Arrival Information Details Patient Name: Alex Boyle, Alex Boyle. Date of Service: 02/20/2021 12:30 PM Medical Record Number: [id number] Patient Account Number: [id number] Date of Birth/Sex: 15-Dec-1945 (75 y.o. M) Treating RN: Donnamarie Poag Primary Care Moataz Tavis: Abelino Derrick Other Clinician: Referring Landi Biscardi: Abelino Derrick Treating Ronie Fleeger/Extender: Skipper Cliche in Treatment: 4 Visit Information History Since Last Visit Added or deleted any medications: No Patient Arrived: Ambulatory Had a fall or experienced change in No Arrival Time: 12:45 activities of daily living that may affect Accompanied By: self risk of falls: Transfer Assistance: None Hospitalized since last visit: No Patient Identification Verified: Yes Has Dressing in Place as Prescribed: Yes Pain Present Now: No Electronic Signature(s) Signed: 02/20/2021 3:39:01 PM By: Donnamarie Poag Entered By: Donnamarie Poag on 02/20/2021 12:45:32 Alex Boyle ([id number]) -------------------------------------------------------------------------------- Clinic Level of Care Assessment Details Patient Name: Alex Boyle Date of Service: 02/20/2021 12:30 PM Medical Record Number: [id number] Patient Account Number: [id number] Date of Birth/Sex: [birth year] (75 y.o. M) Treating RN: Donnamarie Poag Primary Care Domingos Riggi: Abelino Derrick Other Clinician: Referring Wells Mabe: Abelino Derrick Treating Daliah Chaudoin/Extender: Skipper Cliche in Treatment: 4 Clinic Level of Care Assessment Items TOOL 1 Quantity Score [] - Use when EandM and Procedure is performed on INITIAL visit 0 ASSESSMENTS - Nursing Assessment / Reassessment [] - General Physical Exam (combine w/ comprehensive assessment (listed just below) when performed on new 0 pt. evals) [] - 0 Comprehensive Assessment (HX, ROS, Risk Assessments, Wounds Hx, etc.) ASSESSMENTS - Wound and Skin Assessment / Reassessment [] -  Dermatologic / Skin Assessment (not related to wound area) 0 ASSESSMENTS - Ostomy and/or Continence Assessment and Care [] - Incontinence Assessment and Management 0 [] - 0 Ostomy Care Assessment and Management (repouching, etc.) PROCESS - Coordination of Care [] - Simple Patient / Family Education for ongoing care 0 [] - 0 Complex (extensive) Patient / Family Education for ongoing care [] - 0 Staff obtains Consents, Records, Test Results / Process Orders [] - 0 Staff telephones HHA, Nursing Homes / Clarify orders / etc [] - 0 Routine Transfer to another Facility (non-emergent condition) [] - 0 Routine Hospital Admission (non-emergent condition) [] - 0 New Admissions / Biomedical engineer / Ordering NPWT, Apligraf, etc. [] - 0 Emergency Hospital Admission (emergent condition) PROCESS - Special Needs [] - Pediatric / Minor Patient Management 0 [] - 0 Isolation Patient Management [] - 0 Hearing / Language / Visual special needs [] - 0 Assessment of Community assistance (transportation, D/C planning, etc.) [] - 0 Additional assistance / Altered mentation [] - 0 Support Surface(s) Assessment (bed, cushion, seat, etc.) INTERVENTIONS - Miscellaneous [] - External ear exam 0 [] - 0 Patient Transfer (multiple staff / Civil Service fast streamer / Similar devices) [] - 0 Simple Staple / Suture removal (25 or less) [] - 0 Complex Staple / Suture removal (26 or more) [] - 0 Hypo/Hyperglycemic Management (do not check if billed separately) [] - 0 Ankle / Brachial Index (ABI) - do not check if billed separately Has the patient been seen at the hospital within the last three years: Yes Total Score: 0 Level Of Care: ____ Alex Boyle ([id number]) Electronic Signature(s) Signed: 02/20/2021 3:39:01 PM By: Donnamarie Poag Entered By: Donnamarie Poag on 02/20/2021 13:19:03 Alex Boyle ([id number]) -------------------------------------------------------------------------------- Encounter Discharge  Information Details Patient Name: Alex Boyle Date of Service: 02/20/2021 12:30 PM Medical Record Number: [id number] Patient Account Number: [id number] Date of Birth/Sex: Mar 21, 1946 (75 y.o. M)  Treating RN: Donnamarie Poag Primary Care Breckon Reeves: Abelino Derrick Other Clinician: Referring Ayra Hodgdon: Abelino Derrick Treating Ivan Maskell/Extender: Skipper Cliche in Treatment: 4 Encounter Discharge Information Items Post Procedure Vitals Discharge Condition: Stable Temperature (F): 98.4 Ambulatory Status: Ambulatory Pulse (bpm): 69 Discharge Destination: Home Respiratory Rate (breaths/min): 16 Transportation: Private Auto Blood Pressure (mmHg): 124/79 Accompanied By: self Schedule Follow-up Appointment: Yes Clinical Summary of Care: Electronic Signature(s) Signed: 02/20/2021 3:39:01 PM By: Donnamarie Poag Entered By: Donnamarie Poag on 02/20/2021 13:27:24 Alex Boyle ([id number]) -------------------------------------------------------------------------------- Lower Extremity Assessment Details Patient Name: Alex Boyle Date of Service: 02/20/2021 12:30 PM Medical Record Number: [id number] Patient Account Number: [id number] Date of Birth/Sex: 25-Oct-1945 (75 y.o. M) Treating RN: Donnamarie Poag Primary Care Damoni Erker: Abelino Derrick Other Clinician: Referring Tommye Lehenbauer: Abelino Derrick Treating Spike Desilets/Extender: Jeri Cos Weeks in Treatment: 4 Edema Assessment Assessed: [Left: No] [Right: Yes] Edema: [Left: N] [Right: o] Vascular Assessment Pulses: Dorsalis Pedis Palpable: [Right:Yes] Electronic Signature(s) Signed: 02/20/2021 3:39:01 PM By: Donnamarie Poag Entered By: Donnamarie Poag on 02/20/2021 12:51:00 Alex Boyle ([id number]) -------------------------------------------------------------------------------- Multi Wound Chart Details Patient Name: Alex Boyle Date of Service: 02/20/2021 12:30 PM Medical Record Number: [id number] Patient Account Number: [id number] Date of  Birth/Sex: 11-Oct-1945 (75 y.o. M) Treating RN: Donnamarie Poag Primary Care Tavian Callander: Abelino Derrick Other Clinician: Referring Shriyans Kuenzi: Abelino Derrick Treating Romell Cavanah/Extender: Skipper Cliche in Treatment: 4 Vital Signs Height(in): Pulse(bpm): 38 Weight(lbs): 220 Blood Pressure(mmHg): 124/79 Body Mass Index(BMI): Temperature(F): 98.4 Respiratory Rate(breaths/min): 16 Photos: [N/A:N/A] Wound Location: Right Toe Second N/A N/A Wounding Event: Surgical Injury N/A N/A Primary Etiology: Open Surgical Wound N/A N/A Comorbid History: Asthma, Sleep Apnea, N/A N/A Hypertension, Myocardial Infarction, Neuropathy Date Acquired: 06/11/2020 N/A N/A Weeks of Treatment: 4 N/A N/A Wound Status: Open N/A N/A Pending Amputation on Yes N/A N/A Presentation: Measurements L x W x D (cm) 0.2x0.2x0.1 N/A N/A Area (cm) : 0.031 N/A N/A Volume (cm) : 0.003 N/A N/A % Reduction in Area: 84.20% N/A N/A % Reduction in Volume: 96.20% N/A N/A Classification: Full Thickness With Exposed N/A N/A Support Structures Exudate Amount: Medium N/A N/A Exudate Type: Serosanguineous N/A N/A Exudate Color: red, brown N/A N/A Granulation Amount: None Present (0%) N/A N/A Necrotic Amount: Large (67-100%) N/A N/A Necrotic Tissue: Eschar, Adherent Slough N/A N/A Exposed Structures: Fat Layer (Subcutaneous Tissue): N/A N/A Yes Tendon: Yes Bone: Yes Fascia: No Muscle: No Joint: No Epithelialization: Small (1-33%) N/A N/A Treatment Notes Electronic Signature(s) Signed: 02/20/2021 3:39:01 PM By: Donnamarie Poag Entered By: Donnamarie Poag on 02/20/2021 13:13:40 Alex Boyle ([id number]) -------------------------------------------------------------------------------- Multi-Disciplinary Care Plan Details Patient Name: Alex Boyle Date of Service: 02/20/2021 12:30 PM Medical Record Number: [id number] Patient Account Number: [id number] Date of Birth/Sex: January 29, 1946 (75 y.o. M) Treating RN: Donnamarie Poag Primary  Care Avo Schlachter: Abelino Derrick Other Clinician: Referring Raylon Lamson: Abelino Derrick Treating Deshayla Empson/Extender: Skipper Cliche in Treatment: 4 Active Inactive Wound/Skin Impairment Nursing Diagnoses: Impaired tissue integrity Goals: Patient/caregiver will verbalize understanding of skin care regimen Date Initiated: 01/20/2021 Date Inactivated: 02/14/2021 Target Resolution Date: 01/20/2021 Goal Status: Met Ulcer/skin breakdown will have a volume reduction of 30% by week 4 Date Initiated: 01/20/2021 Target Resolution Date: 02/20/2021 Goal Status: Active Ulcer/skin breakdown will have a volume reduction of 50% by week 8 Date Initiated: 01/20/2021 Target Resolution Date: 03/22/2021 Goal Status: Active Ulcer/skin breakdown will have a volume reduction of 80% by week 12 Date Initiated: 01/20/2021 Target Resolution Date: 04/22/2021 Goal Status: Active Ulcer/skin breakdown will heal within 14 weeks Date Initiated: 01/20/2021 Target Resolution  Date: 05/22/2021 Goal Status: Active Interventions: Assess patient/caregiver ability to obtain necessary supplies Assess patient/caregiver ability to perform ulcer/skin care regimen upon admission and as needed Assess ulceration(s) every visit Provide education on ulcer and skin care Treatment Activities: Skin care regimen initiated : 01/20/2021 Notes: Electronic Signature(s) Signed: 02/20/2021 3:39:01 PM By: Donnamarie Poag Entered By: Donnamarie Poag on 02/20/2021 13:13:25 Alex Boyle ([id number]) -------------------------------------------------------------------------------- Pain Assessment Details Patient Name: Alex Boyle Date of Service: 02/20/2021 12:30 PM Medical Record Number: [id number] Patient Account Number: [id number] Date of Birth/Sex: Oct 04, 1945 (75 y.o. M) Treating RN: Donnamarie Poag Primary Care Oluwaseyi Raffel: Abelino Derrick Other Clinician: Referring Jarquis Walker: Abelino Derrick Treating Quincee Gittens/Extender: Skipper Cliche in Treatment:  4 Active Problems Location of Pain Severity and Description of Pain Patient Has Paino No Site Locations Rate the pain. Current Pain Level: 0 Pain Management and Medication Current Pain Management: Electronic Signature(s) Signed: 02/20/2021 3:39:01 PM By: Donnamarie Poag Entered By: Donnamarie Poag on 02/20/2021 12:50:14 Alex Boyle ([id number]) -------------------------------------------------------------------------------- Patient/Caregiver Education Details Patient Name: Alex Boyle Date of Service: 02/20/2021 12:30 PM Medical Record Number: [id number] Patient Account Number: [id number] Date of Birth/Gender: 01-09-1946 (75 y.o. M) Treating RN: Donnamarie Poag Primary Care Physician: Abelino Derrick Other Clinician: Referring Physician: Abelino Derrick Treating Physician/Extender: Skipper Cliche in Treatment: 4 Education Assessment Education Provided To: Patient Education Topics Provided Basic Hygiene: Wound Debridement: Wound/Skin Impairment: Electronic Signature(s) Signed: 02/20/2021 3:39:01 PM By: Donnamarie Poag Entered By: Donnamarie Poag on 02/20/2021 13:19:22 Alex Boyle ([id number]) -------------------------------------------------------------------------------- Wound Assessment Details Patient Name: Alex Boyle Date of Service: 02/20/2021 12:30 PM Medical Record Number: [id number] Patient Account Number: [id number] Date of Birth/Sex: 11/23/1945 (75 y.o. M) Treating RN: Donnamarie Poag Primary Care Aniesha Haughn: Abelino Derrick Other Clinician: Referring Everest Hacking: Abelino Derrick Treating Mardy Hoppe/Extender: Skipper Cliche in Treatment: 4 Wound Status Wound Number: 1 Primary Open Surgical Wound Etiology: Wound Location: Right Toe Second Wound Status: Open Wounding Event: Surgical Injury Comorbid Asthma, Sleep Apnea, Hypertension, Myocardial Date Acquired: 06/11/2020 History: Infarction, Neuropathy Weeks Of Treatment: 4 Clustered Wound: No Pending Amputation On  Presentation Photos Wound Measurements Length: (cm) 0.2 Width: (cm) 0.2 Depth: (cm) 0.1 Area: (cm) 0.031 Volume: (cm) 0.003 % Reduction in Area: 84.2% % Reduction in Volume: 96.2% Epithelialization: Small (1-33%) Tunneling: No Undermining: No Wound Description Classification: Full Thickness With Exposed Support Structures Exudate Amount: Medium Exudate Type: Serosanguineous Exudate Color: red, brown Foul Odor After Cleansing: No Slough/Fibrino Yes Wound Bed Granulation Amount: None Present (0%) Exposed Structure Necrotic Amount: Large (67-100%) Fascia Exposed: No Necrotic Quality: Eschar, Adherent Slough Fat Layer (Subcutaneous Tissue) Exposed: Yes Tendon Exposed: Yes Muscle Exposed: No Joint Exposed: No Bone Exposed: Yes Treatment Notes Wound #1 (Toe Second) Wound Laterality: Right Cleanser Normal Saline Discharge Instruction: Wash your hands with soap and water. Remove old dressing, discard into plastic bag and place into trash. Cleanse the wound with Normal Saline prior to applying a clean dressing using gauze sponges, not tissues or cotton balls. Do not scrub or use excessive force. Pat dry using gauze sponges, not tissue or cotton balls. LUIZ, TRUMPOWER ([id number]) Peri-Wound Care Topical Primary Dressing Hydrofera Blue Classic Foam Rope Dressing, 9x6 (mm/in) Discharge Instruction: cut the dressing to fit, then moisten with saline after placing it into the wound Secondary Dressing Hooper Roll-Small Discharge Instruction: [place] as directed Gauze Discharge Instruction: Apply over prisma to cover Secured With [place] Surgical Tape, 2x2 (in/yd) Discharge Instruction: Secure conform Compression Wrap Compression  Stockings Environmental education officer) Signed: 02/20/2021 3:39:01 PM By: Donnamarie Poag Entered By: Donnamarie Poag on 02/20/2021 12:50:44 Alex Boyle  ([id number]) -------------------------------------------------------------------------------- [place] Details Patient Name: Alex Boyle Date of Service: 02/20/2021 12:30 PM Medical Record Number: [id number] Patient Account Number: [id number] Date of Birth/Sex: 1945/07/08 (75 y.o. M) Treating RN: Donnamarie Poag Primary Care Dorothea Yow: Abelino Derrick Other Clinician: Referring Asuncion Shibata: Abelino Derrick Treating Tiondra Fang/Extender: Skipper Cliche in Treatment: 4 Vital Signs Time Taken: 12:47 Temperature (F): 98.4 Weight (lbs): 220 Pulse (bpm): 69 Respiratory Rate (breaths/min): 16 Blood Pressure (mmHg): 124/79 Reference Range: 80 - 120 mg / dl Electronic Signature(s) Signed: 02/20/2021 3:39:01 PM By: Donnamarie Poag Entered ByDonnamarie Poag on 02/20/2021 12:49:59

## 2021-02-24 ENCOUNTER — Encounter: Payer: Medicare Other | Admitting: Podiatry

## 2021-02-27 ENCOUNTER — Other Ambulatory Visit: Payer: Self-pay

## 2021-02-27 ENCOUNTER — Encounter: Payer: Federal, State, Local not specified - PPO | Admitting: Physician Assistant

## 2021-02-27 DIAGNOSIS — I2510 Atherosclerotic heart disease of native coronary artery without angina pectoris: Secondary | ICD-10-CM | POA: Diagnosis not present

## 2021-02-27 DIAGNOSIS — L97516 Non-pressure chronic ulcer of other part of right foot with bone involvement without evidence of necrosis: Secondary | ICD-10-CM | POA: Diagnosis not present

## 2021-02-27 DIAGNOSIS — I10 Essential (primary) hypertension: Secondary | ICD-10-CM | POA: Diagnosis not present

## 2021-02-27 DIAGNOSIS — L97519 Non-pressure chronic ulcer of other part of right foot with unspecified severity: Secondary | ICD-10-CM | POA: Diagnosis not present

## 2021-02-27 DIAGNOSIS — T8131XA Disruption of external operation (surgical) wound, not elsewhere classified, initial encounter: Secondary | ICD-10-CM | POA: Diagnosis not present

## 2021-02-27 NOTE — Progress Notes (Addendum)
GLYN, BARRAZA ([id number]) Visit Report for 02/27/2021 Chief Complaint Document Details Patient Name: Alex, Boyle. Date of Service: 02/27/2021 1:00 PM Medical Record Number: [id number] Patient Account Number: [id number] Date of Birth/Sex: [birth year] (75 y.o. M) Treating RN: Donnamarie Poag Primary Care Provider: Abelino Derrick Other Clinician: Referring Provider: Abelino Derrick Treating Provider/Extender: Skipper Cliche in Treatment: 5 Information Obtained from: Patient Chief Complaint Right 2nd Toe Ulcer Electronic Signature(s) Signed: 02/27/2021 1:33:52 PM By: Worthy Keeler PA-C Entered By: Worthy Keeler on 02/27/2021 13:33:52 Alex Boyle ([id number]) -------------------------------------------------------------------------------- Debridement Details Patient Name: Alex Boyle Date of Service: 02/27/2021 1:00 PM Medical Record Number: [id number] Patient Account Number: [id number] Date of Birth/Sex: 1946/06/03 (75 y.o. M) Treating RN: Donnamarie Poag Primary Care Provider: Abelino Derrick Other Clinician: Referring Provider: Abelino Derrick Treating Provider/Extender: Skipper Cliche in Treatment: 5 Debridement Performed for Wound #1 Right Toe Second Assessment: Performed By: Physician Tommie Sams., PA-C Debridement Type: Debridement Level of Consciousness (Pre- Awake and Alert procedure): Pre-procedure Verification/Time Out Yes - 13:36 Taken: Start Time: 13:36 Pain Control: Lidocaine Total Area Debrided (L x W): 0.2 (cm) x 0.2 (cm) = 0.04 (cm) Tissue and other material Viable, Non-Viable, Slough, Subcutaneous, Slough debrided: Level: Skin/Subcutaneous Tissue Debridement Description: Excisional Instrument: Curette Bleeding: Minimum Hemostasis Achieved: Pressure Response to Treatment: Procedure was tolerated well Level of Consciousness (Post- Awake and Alert procedure): Post Debridement Measurements of Total Wound Length: (cm) 0.2 Width: (cm)  0.2 Depth: (cm) 0.4 Volume: (cm) 0.013 Character of Wound/Ulcer Post Debridement: Improved Post Procedure Diagnosis Same as Pre-procedure Electronic Signature(s) Signed: 02/27/2021 3:50:58 PM By: Donnamarie Poag Signed: 02/27/2021 6:26:07 PM By: Worthy Keeler PA-C Entered By: Donnamarie Poag on 02/27/2021 13:41:10 Alex Boyle ([id number]) -------------------------------------------------------------------------------- HPI Details Patient Name: Alex Boyle Date of Service: 02/27/2021 1:00 PM Medical Record Number: [id number] Patient Account Number: [id number] Date of Birth/Sex: 1946/04/12 (75 y.o. M) Treating RN: Donnamarie Poag Primary Care Provider: Abelino Derrick Other Clinician: Referring Provider: Abelino Derrick Treating Provider/Extender: Skipper Cliche in Treatment: 5 History of Present Illness HPI Description: 01/20/2021 upon evaluation today patient appears to be doing somewhat poorly in regard to a surgery that he had that was actually on 06/10/2020. He tells me that the toe was subsequently a hammertoe and this needed to be corrected by surgical intervention. When they got and the joint was so deteriorated and ended up having to actually pin and fuse the toe. This no longer Britta Mccreedy has given him some trouble he tells me. With that being said he has been seeing [facility], Dr. Sherryle Lis, and the only thing that is really been mentioned is going to try to remove a portion of the toe in order to basically it sounds like refuse this. Nonetheless I really do not know that the patient is interested in going down that road at this point based on what I am hearing. He does have noted culture on 12/22/2020 which was positive for Serratia. Nonetheless he has been on doxycycline though that just ended today. I would probably extend this for him due to the fact that I want him to be on this plenty in order to justify a good trial of antibiotics as well as wound care before potential for  hyperbarics if he does indeed have osteomyelitis noted. The good news is his ABIs are on the right 1.38 with a TBI of 0.79 and on the left 1.21 with a TBI of 0.70. This was on 11/23/2020. Notably the patient  is obviously very frustrated with the situation he has not had an MRI at this point since the surgery. I think that is really what we need to look into doing. Recently well8/22/2022 upon evaluation today patient appears to be doing regard to his toe ulcer. This is definitely not significantly improved compared to previous but is also not doing any worse to be honest. I did review his MRI today as well. With that being said I do not see any signs of definitive osteomyelitis currently. There was some mention of abnormal T1 and T2 signal intensity in the proximal and middle phalange ease of the second toe which could be due to recent surgery/stress reaction although osteomyelitis could not be excluded. Being that the bone at this location seems to be very solid there is no signs of flaking and in general the patient's toe does not appear to be showing signs of significant warmth with the erythema although there is some redness I think this is more just inflammatory as a result of the surgery. My gut feeling is that this does not represent osteomyelitis based on what I am seeing physically as well. The MRI also does not identify any signs of infection anywhere else in the foot which is also good news. In general this is very reassuring in my opinion. All of this was discussed with the patient during the office visit today. 02/06/2021 upon evaluation today patient's toe actually appears to be doing decently well today. There is still bone noted in the base of the toe but in general I think we are making some progress and this seems to be closing in quite nicely. With that being said he is going require little bit of debridement today. Also did discuss with him that we got the results back from the  reaching out to the insurance company about the [place] unfortunately that was denied as a not a covered service. In the meantime I think continuing with the collagen is probably the best way to go. 02/14/2021 upon evaluation today patient appears to be doing well with regard to his toe ulcer. He has been tolerating the dressing changes without complication. Fortunately I do think that though he is doing better and this looks much better no longer erythematous the doxycycline in that regard. Nonetheless I do believe that he is still having some issues here to be honest with some necrotic debris buildup and we are getting need to clean this away. He is in agreement with that plan. 02/20/2021 upon evaluation today patient's toe actually again showed signs of the collagen being somewhat dried into the wound trapping fluid underneath. I think this is actually more detrimental than beneficial. There is some additional granulation today which is good news but in general I feel like the biggest issue is that we need to do what we can to try to prevent having ongoing and continued issues with this occurring. For that reason I think switching to [facility] Blue rope that we can put a small piece into the wound and then moisten would be the ideal thing to fill the space and prevent fluid trapping. 02/27/2021 upon evaluation today patient's toe ulcer actually showing some signs of improvement. This is very slow to progress but nonetheless I do feel like he is showing signs of greater improvement especially compared to where we started is also really not showing any signs of infection which is also great news. Overall I am extremely pleased with where we stand today. Electronic Signature(s)  Signed: 02/27/2021 6:09:57 PM By: Worthy Keeler PA-C Entered By: Worthy Keeler on 02/27/2021 18:09:57 Alex Boyle ([id number]) -------------------------------------------------------------------------------- Physical Exam  Details Patient Name: Alex Boyle Date of Service: 02/27/2021 1:00 PM Medical Record Number: [id number] Patient Account Number: [id number] Date of Birth/Sex: [birth year] (75 y.o. M) Treating RN: Donnamarie Poag Primary Care Provider: Abelino Derrick Other Clinician: Referring Provider: Abelino Derrick Treating Provider/Extender: Skipper Cliche in Treatment: 5 Constitutional Well-nourished and well-hydrated in no acute distress. Respiratory normal breathing without difficulty. Psychiatric this patient is able to make decisions and demonstrates good insight into disease process. Alert and Oriented x 3. pleasant and cooperative. Notes Patient's wound bed again did require some sharp debridement to remove some of the necrotic debris and dried drainage around the edges of the wound. He tolerated all that today without complication postdebridement wound bed appears to be doing much better which is great news. No fevers, chills, nausea, vomiting, or diarrhea. Electronic Signature(s) Signed: 02/27/2021 6:10:15 PM By: Worthy Keeler PA-C Entered By: Worthy Keeler on 02/27/2021 18:10:15 Alex Boyle ([id number]) -------------------------------------------------------------------------------- Physician Orders Details Patient Name: Alex Boyle Date of Service: 02/27/2021 1:00 PM Medical Record Number: [id number] Patient Account Number: [id number] Date of Birth/Sex: 03-Feb-1946 (75 y.o. 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 Bathing/ Shower/ Hygiene o May shower; gently cleanse wound with antibacterial soap, rinse and pat dry prior to dressing wounds o No tub bath. Edema Control - Lymphedema / Segmental Compressive Device / Other o Elevate leg(s) parallel to the floor when sitting. o DO YOUR BEST to sleep in the bed at night. DO NOT sleep in your recliner. Long hours of sitting in a recliner leads to swelling of the legs and/or potential wounds on your backside. Off-Loading Wound #1 Right Toe Second o Other: - KEEP PRESSURE OFF OF FOOT/WOUND Medications-Please add to medication list. o P.O. Antibiotics - Extend doxycycline Wound Treatment Wound #1 - Toe Second Wound Laterality: Right Cleanser: Normal Saline Every Other Day/30 Days Discharge Instructions: Wash your hands with soap and water. Remove old dressing, discard into plastic bag and place into trash. Cleanse the wound with Normal Saline prior to applying a clean dressing using gauze sponges, not tissues or cotton balls. Do not scrub or use excessive force. Pat dry using gauze sponges, not tissue or cotton balls. Primary Dressing: Hydrofera Blue Classic Foam Rope Dressing, 9x6 (mm/in) Every Other Day/30 Days Discharge Instructions: cut the dressing to fit, then moisten with saline after placing it into the wound Secondary Dressing: Conforming Guaze Roll-Small (Generic) Every Other Day/30 Days Discharge Instructions: [place] as directed Secondary Dressing: Gauze Every Other Day/30 Days Secured With: 35M Medipore H Soft Cloth Surgical Tape, 2x2 (in/yd) (Generic) Every Other Day/30 Days Discharge Instructions: Secure conform Electronic Signature(s) Signed: 02/27/2021 3:50:58 PM By: Donnamarie Poag Signed: 02/27/2021 6:26:07 PM By: Worthy Keeler PA-C Entered By:  Donnamarie Poag on 02/27/2021 13:42:38 Alex Boyle ([id number]) -------------------------------------------------------------------------------- Problem List Details Patient Name: Alex Boyle Date of  Service: 02/27/2021 1:00 PM Medical Record Number: [id number] Patient Account Number: [id number] Date of Birth/Sex: April 12, 1946 (75 y.o. 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 M) Treating RN: Donnamarie Poag Primary Care Provider: Abelino Derrick Other Clinician: Referring Provider: Abelino Derrick Treating Provider/Extender: Skipper Cliche in Treatment: 5 Subjective Chief Complaint Information obtained from Patient Right 2nd Toe Ulcer History of Present Illness (HPI) 01/20/2021 upon evaluation today patient appears to be doing somewhat poorly in regard to a surgery that he had that was actually  on 06/10/2020. He tells me that the toe was subsequently a hammertoe and this needed to be corrected by surgical intervention. When they got and the joint was so deteriorated and ended up having to actually pin and fuse the toe. This no longer Britta Mccreedy has given him some trouble he tells me. With that being said he has been seeing [facility], Dr. Sherryle Lis, and the only thing that is really been mentioned is going to try to remove a portion of the toe in order to basically it sounds like refuse this. Nonetheless I really do not know that the patient is interested in going down that road at this point based on what I am hearing. He does have noted culture on 12/22/2020 which was positive for Serratia. Nonetheless he has been on doxycycline though that just ended today. I would probably extend this for him due to the fact that I want him to be on this plenty in order to justify a good trial of antibiotics as well as wound care before potential for hyperbarics if he does indeed have osteomyelitis noted. The good news is his ABIs are on the right 1.38 with a TBI of 0.79 and on the left 1.21 with a TBI of 0.70. This was on 11/23/2020. Notably the patient is obviously very frustrated with the situation he has not had an MRI at this point since the surgery. I think that is really what we need to look into doing. Recently well8/22/2022 upon evaluation today patient appears to be doing regard to his toe ulcer. This is definitely not significantly improved compared to previous but is also not doing any worse to be honest. I did review his MRI today as well. With that being said I do not see any signs of definitive osteomyelitis currently. There was some mention of abnormal T1 and T2 signal intensity in the proximal and middle phalange ease of the second toe which could be due to recent surgery/stress reaction although osteomyelitis could not be excluded. Being that the bone at this location seems to be very  solid there is no signs of flaking and in general the patient's toe does not appear to  be showing signs of significant warmth with the erythema although there is some redness I think this is more just inflammatory as a result of the surgery. My gut feeling is that this does not represent osteomyelitis based on what I am seeing physically as well. The MRI also does not identify any signs of infection anywhere else in the foot which is also good news. In general this is very reassuring in my opinion. All of this was discussed with the patient during the office visit today. 02/06/2021 upon evaluation today patient's toe actually appears to be doing decently well today. There is still bone noted in the base of the toe but in general I think we are making some progress and this seems to be closing in quite nicely. With that being said he is going require little bit of debridement today. Also did discuss with him that we got the results back from the reaching out to the insurance company about the [place] unfortunately that was denied as a not a covered service. In the meantime I think continuing with the collagen is probably the best way to go. 02/14/2021 upon evaluation today patient appears to be doing well with regard to his toe ulcer. He has been tolerating the dressing changes without complication. Fortunately I do think that though he is doing better and this looks much better no longer erythematous the doxycycline in that regard. Nonetheless I do believe that he is still having some issues here to be honest with some necrotic debris buildup and we are getting need to clean this away. He is in agreement with that plan. 02/20/2021 upon evaluation today patient's toe actually again showed signs of the collagen being somewhat dried into the wound trapping fluid underneath. I think this is actually more detrimental than beneficial. There is some additional granulation today which is good news but in general I  feel like the biggest issue is that we need to do what we can to try to prevent having ongoing and continued issues with this occurring. For that reason I think switching to [facility] Blue rope that we can put a small piece into the wound and then moisten would be the ideal thing to fill the space and prevent fluid trapping. 02/27/2021 upon evaluation today patient's toe ulcer actually showing some signs of improvement. This is very slow to progress but nonetheless I do feel like he is showing signs of greater improvement especially compared to where we started is also really not showing any signs of infection which is also great news. Overall I am extremely pleased with where we stand today. Objective Constitutional Well-nourished and well-hydrated in no acute distress. Vitals Time Taken: 1:03 PM, Weight: 220 lbs, Temperature: 98.0 F, Pulse: 71 bpm, Respiratory Rate: 16 breaths/min, Blood Pressure: 138/73 mmHg. Alex Boyle, Alex Boyle ([id number]) Respiratory normal breathing without difficulty. Psychiatric this patient is able to make decisions and demonstrates good insight into disease process. Alert and Oriented x 3. pleasant and cooperative. General Notes: Patient's wound bed again did require some sharp debridement to remove some of the necrotic debris and dried drainage around the edges of the wound. He tolerated all that today without complication postdebridement wound bed appears to be doing much better which is great news. No fevers, chills, nausea, vomiting, or diarrhea. Integumentary (Hair, Skin) Wound #1 status is Open. Original cause of wound was Surgical Injury. The date acquired was: 06/11/2020. The wound has been in treatment 5 weeks. The wound is located on the Right Toe  Second. The wound measures 0.2cm length x 0.2cm width x 0.3cm depth; 0.031cm^2 area and 0.009cm^3 volume. There is bone, tendon, and Fat Layer (Subcutaneous Tissue) exposed. There is no tunneling or undermining noted.  There is a medium amount of serosanguineous drainage noted. There is large (67-100%) red, pink granulation within the wound bed. There is a small (1-33%) amount of necrotic tissue within the wound bed including Adherent Slough. 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 There was a Excisional Skin/Subcutaneous Tissue Debridement with a total area of 0.04 sq cm performed by Tommie Sams., PA-C. With the following instrument(s): Curette to remove Viable and Non-Viable tissue/material. Material removed includes Subcutaneous Tissue and Slough and after achieving pain control using Lidocaine. A time out was conducted at 13:36, prior to the start of the procedure. A Minimum amount of bleeding was controlled with Pressure. The procedure was tolerated well. Post Debridement Measurements: 0.2cm length x 0.2cm width x 0.4cm depth; 0.013cm^3 volume. Character of Wound/Ulcer Post Debridement is improved. Post procedure Diagnosis Wound #1: Same as Pre-Procedure Plan Follow-up Appointments: Return Appointment in 1 week. Bathing/ Shower/ Hygiene: May shower; gently cleanse wound with antibacterial soap, rinse and pat dry prior to dressing wounds No tub bath. Edema Control - Lymphedema / Segmental Compressive Device / Other: Elevate leg(s) parallel to the floor when sitting. DO YOUR BEST to sleep in the bed at night. DO NOT sleep in your recliner. Long hours of sitting in a recliner leads to swelling of the legs and/or potential wounds on your backside. Off-Loading: Wound #1 Right Toe  Second: Other: - KEEP PRESSURE OFF OF FOOT/WOUND Medications-Please add to medication list.: P.O. Antibiotics - Extend doxycycline WOUND #1: - Toe Second Wound Laterality: Right Cleanser: Normal Saline Every Other Day/30 Days Discharge Instructions: Wash your hands with soap and water. Remove old dressing, discard into plastic bag and place into trash. Cleanse the wound with Normal Saline prior to applying a clean dressing using gauze sponges, not tissues or cotton balls. Do not scrub or use excessive force. Pat dry using gauze sponges, not tissue or cotton balls. Primary Dressing: Hydrofera Blue Classic Foam Rope Dressing, 9x6 (mm/in) Every Other Day/30 Days Alex Boyle, Alex Boyle ([id number]) Discharge Instructions: cut the dressing to fit, then moisten with saline after placing it into the wound Secondary Dressing: Conforming Guaze Roll-Small (Generic) Every Other Day/30 Days Discharge Instructions: [place] as directed Secondary Dressing: Gauze Every Other Day/30 Days Secured With: 65M Medipore H Soft Cloth Surgical Tape, 2x2 (in/yd) (Generic) Every Other Day/30 Days Discharge Instructions: Secure conform 1. I do believe that were on the right track and I think I would recommend we still continue with the [facility] I think that is our best option here. 2. I am also can recommend that we have the patient continue to monitor for any signs of worsening such as infection. Obviously if he develops any issues with redness or increased pain he should let me know soon as possible. We will see patient back for reevaluation in 1 week here in the clinic. If anything worsens or changes patient will contact our office for additional recommendations. Electronic Signature(s) Signed: 02/27/2021  6:10:39 PM By: Worthy Keeler PA-C Entered By: Worthy Keeler on 02/27/2021 18:10:39 Alex Boyle  ([id number]) -------------------------------------------------------------------------------- SuperBill Details Patient Name: Alex Boyle Date of Service: 02/27/2021 Medical Record Number: [id number] Patient Account Number: [id number] Date of Birth/Sex: May 27, 1946 (75 y.o. 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 I10 Essential (primary) hypertension I25.10 Atherosclerotic heart disease of native coronary artery without angina pectoris Facility Procedures CPT4 Code Description: [id number] - DEB SUBQ TISSUE 20 SQ CM/< Modifier: Quantity: 1 CPT4 Code Description: ICD-10 Diagnosis Description 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

## 2021-02-27 NOTE — Progress Notes (Signed)
MALAKYE, NOLDEN ([id number]) Visit Report for 02/27/2021 Arrival Information Details Patient Name: Alex Boyle, Alex Boyle. Date of Service: 02/27/2021 1:00 PM Medical Record Number: [id number] Patient Account Number: [id number] Date of Birth/Sex: [birth year] (75 y.o. M) Treating RN: Donnamarie Poag Primary Care Eleora Sutherland: Abelino Derrick Other Clinician: Referring Cleo Santucci: Abelino Derrick Treating Emira Eubanks/Extender: Skipper Cliche in Treatment: 5 Visit Information History Since Last Visit Added or deleted any medications: No Patient Arrived: Ambulatory Had a fall or experienced change in No Arrival Time: 13:03 activities of daily living that may affect Accompanied By: self risk of falls: Transfer Assistance: None Hospitalized since last visit: No Has Dressing in Place as Prescribed: Yes Pain Present Now: No Electronic Signature(s) Signed: 02/27/2021 3:50:58 PM By: Donnamarie Poag Entered By: Donnamarie Poag on 02/27/2021 13:03:41 Alex Boyle ([id number]) -------------------------------------------------------------------------------- Clinic Level of Care Assessment Details Patient Name: Alex Boyle Date of Service: 02/27/2021 1:00 PM Medical Record Number: [id number] Patient Account Number: [id number] Date of Birth/Sex: 09/17/1945 (75 y.o. M) Treating RN: Donnamarie Poag Primary Care Clevon Khader: Abelino Derrick Other Clinician: Referring Niana Martorana: Abelino Derrick Treating Lee Kuang/Extender: Skipper Cliche in Treatment: 5 Clinic Level of Care Assessment Items TOOL 1 Quantity Score []  - Use when EandM and Procedure is performed on INITIAL visit 0 ASSESSMENTS - Nursing Assessment / Reassessment []  - General Physical Exam (combine w/ comprehensive assessment (listed just below) when performed on new 0 pt. evals) []  - 0 Comprehensive Assessment (HX, ROS, Risk Assessments, Wounds Hx, etc.) ASSESSMENTS - Wound and Skin Assessment / Reassessment []  - Dermatologic / Skin Assessment (not related to  wound area) 0 ASSESSMENTS - Ostomy and/or Continence Assessment and Care []  - Incontinence Assessment and Management 0 []  - 0 Ostomy Care Assessment and Management (repouching, etc.) PROCESS - Coordination of Care []  - Simple Patient / Family Education for ongoing care 0 []  - 0 Complex (extensive) Patient / Family Education for ongoing care []  - 0 Staff obtains Programmer, systems, Records, Test Results / Process Orders []  - 0 Staff telephones HHA, Nursing Homes / Clarify orders / etc []  - 0 Routine Transfer to another Facility (non-emergent condition) []  - 0 Routine Hospital Admission (non-emergent condition) []  - 0 New Admissions / Biomedical engineer / Ordering NPWT, Apligraf, etc. []  - 0 Emergency Hospital Admission (emergent condition) PROCESS - Special Needs []  - Pediatric / Minor Patient Management 0 []  - 0 Isolation Patient Management []  - 0 Hearing / Language / Visual special needs []  - 0 Assessment of Community assistance (transportation, D/C planning, etc.) []  - 0 Additional assistance / Altered mentation []  - 0 Support Surface(s) Assessment (bed, cushion, seat, etc.) INTERVENTIONS - Miscellaneous []  - External ear exam 0 []  - 0 Patient Transfer (multiple staff / Civil Service fast streamer / Similar devices) []  - 0 Simple Staple / Suture removal (25 or less) []  - 0 Complex Staple / Suture removal (26 or more) []  - 0 Hypo/Hyperglycemic Management (do not check if billed separately) []  - 0 Ankle / Brachial Index (ABI) - do not check if billed separately Has the patient been seen at the hospital within the last three years: Yes Total Score: 0 Level Of Care: ____ Alex Boyle ([id number]) Electronic Signature(s) Signed: 02/27/2021 3:50:58 PM By: Donnamarie Poag Entered By: Donnamarie Poag on 02/27/2021 13:41:18 Alex Boyle ([id number]) -------------------------------------------------------------------------------- Encounter Discharge Information Details Patient Name: Alex Boyle Date of Service: 02/27/2021 1:00 PM Medical Record Number: [id number] Patient Account Number: [id number] Date of Birth/Sex: 04/08/1946 (75 y.o. M) Treating RN: Donnamarie Poag  Primary Care Dinorah Masullo: Abelino Derrick Other Clinician: Referring Ettel Albergo: Abelino Derrick Treating Ta Fair/Extender: Skipper Cliche in Treatment: 5 Encounter Discharge Information Items Post Procedure Vitals Discharge Condition: Stable Temperature (F): 98.0 Ambulatory Status: Ambulatory Pulse (bpm): 71 Discharge Destination: Home Respiratory Rate (breaths/min): 16 Transportation: Private Auto Blood Pressure (mmHg): 138/73 Accompanied By: self Schedule Follow-up Appointment: Yes Clinical Summary of Care: Electronic Signature(s) Signed: 02/27/2021 3:50:58 PM By: Donnamarie Poag Entered By: Donnamarie Poag on 02/27/2021 13:43:03 Alex Boyle ([id number]) -------------------------------------------------------------------------------- Lower Extremity Assessment Details Patient Name: Alex Boyle Date of Service: 02/27/2021 1:00 PM Medical Record Number: [id number] Patient Account Number: [id number] Date of Birth/Sex: 05/20/1946 (75 y.o. M) Treating RN: Donnamarie Poag Primary Care Christoph Copelan: Abelino Derrick Other Clinician: Referring Brita Jurgensen: Abelino Derrick Treating Sharilyn Geisinger/Extender: Jeri Cos Weeks in Treatment: 5 Edema Assessment Assessed: [Left: No] [Right: Yes] Edema: [Left: N] [Right: o] Vascular Assessment Pulses: Dorsalis Pedis Palpable: [Right:Yes] Electronic Signature(s) Signed: 02/27/2021 3:50:58 PM By: Donnamarie Poag Entered By: Donnamarie Poag on 02/27/2021 13:08:26 Alex Boyle ([id number]) -------------------------------------------------------------------------------- Multi Wound Chart Details Patient Name: Alex Boyle Date of Service: 02/27/2021 1:00 PM Medical Record Number: [id number] Patient Account Number: [id number] Date of Birth/Sex: 06/17/1945 (75 y.o. M) Treating RN: Donnamarie Poag Primary Care Shawnique Mariotti: Abelino Derrick Other Clinician: Referring Dnaiel Voller: Abelino Derrick Treating Natilie Krabbenhoft/Extender: Skipper Cliche in Treatment: 5 Vital Signs Height(in): Pulse(bpm): 37 Weight(lbs): 220 Blood Pressure(mmHg): 138/73 Body Mass Index(BMI): Temperature(F): 98.0 Respiratory Rate(breaths/min): 16 Photos: [N/A:N/A] Wound Location: Right Toe Second N/A N/A Wounding Event: Surgical Injury N/A N/A Primary Etiology: Open Surgical Wound N/A N/A Comorbid History: Asthma, Sleep Apnea, N/A N/A Hypertension, Myocardial Infarction, Neuropathy Date Acquired: 06/11/2020 N/A N/A Weeks of Treatment: 5 N/A N/A Wound Status: Open N/A N/A Pending Amputation on Yes N/A N/A Presentation: Measurements L x W x D (cm) 0.2x0.2x0.3 N/A N/A Area (cm) : 0.031 N/A N/A Volume (cm) : 0.009 N/A N/A % Reduction in Area: 84.20% N/A N/A % Reduction in Volume: 88.60% N/A N/A Classification: Full Thickness With Exposed N/A N/A Support Structures Exudate Amount: Medium N/A N/A Exudate Type: Serosanguineous N/A N/A Exudate Color: red, brown N/A N/A Granulation Amount: Large (67-100%) N/A N/A Granulation Quality: Red, Pink N/A N/A Necrotic Amount: Small (1-33%) N/A N/A Exposed Structures: Fat Layer (Subcutaneous Tissue): N/A N/A Yes Tendon: Yes Bone: Yes Fascia: No Muscle: No Joint: No Epithelialization: Small (1-33%) N/A N/A Treatment Notes Electronic Signature(s) Signed: 02/27/2021 3:50:58 PM By: Donnamarie Poag Entered By: Donnamarie Poag on 02/27/2021 13:09:57 Alex Boyle ([id number]) -------------------------------------------------------------------------------- Multi-Disciplinary Care Plan Details Patient Name: Alex Boyle Date of Service: 02/27/2021 1:00 PM Medical Record Number: [id number] Patient Account Number: [id number] Date of Birth/Sex: 05-28-1946 (75 y.o. M) Treating RN: Donnamarie Poag Primary Care Neka Bise: Abelino Derrick Other Clinician: Referring Tyja Gortney:  Abelino Derrick Treating Thula Stewart/Extender: Skipper Cliche in Treatment: 5 Active Inactive Wound/Skin Impairment Nursing Diagnoses: Impaired tissue integrity Goals: Patient/caregiver will verbalize understanding of skin care regimen Date Initiated: 01/20/2021 Date Inactivated: 02/14/2021 Target Resolution Date: 01/20/2021 Goal Status: Met Ulcer/skin breakdown will have a volume reduction of 30% by week 4 Date Initiated: 01/20/2021 Target Resolution Date: 02/20/2021 Goal Status: Active Ulcer/skin breakdown will have a volume reduction of 50% by week 8 Date Initiated: 01/20/2021 Target Resolution Date: 03/22/2021 Goal Status: Active Ulcer/skin breakdown will have a volume reduction of 80% by week 12 Date Initiated: 01/20/2021 Target Resolution Date: 04/22/2021 Goal Status: Active Ulcer/skin breakdown will heal within 14 weeks Date Initiated: 01/20/2021 Target Resolution Date: 05/22/2021 Goal Status: Active Interventions:  Assess patient/caregiver ability to obtain necessary supplies Assess patient/caregiver ability to perform ulcer/skin care regimen upon admission and as needed Assess ulceration(s) every visit Provide education on ulcer and skin care Treatment Activities: Skin care regimen initiated : 01/20/2021 Notes: Electronic Signature(s) Signed: 02/27/2021 3:50:58 PM By: Donnamarie Poag Entered By: Donnamarie Poag on 02/27/2021 13:08:36 Alex Boyle ([id number]) -------------------------------------------------------------------------------- Pain Assessment Details Patient Name: Alex Boyle Date of Service: 02/27/2021 1:00 PM Medical Record Number: [id number] Patient Account Number: [id number] Date of Birth/Sex: 03-31-1946 (75 y.o. M) Treating RN: Donnamarie Poag Primary Care Sipriano Fendley: Abelino Derrick Other Clinician: Referring Eboney Claybrook: Abelino Derrick Treating Ligaya Cormier/Extender: Skipper Cliche in Treatment: 5 Active Problems Location of Pain Severity and Description of  Pain Patient Has Paino No Site Locations Rate the pain. Current Pain Level: 0 Pain Management and Medication Current Pain Management: Electronic Signature(s) Signed: 02/27/2021 3:50:58 PM By: Donnamarie Poag Entered By: Donnamarie Poag on 02/27/2021 13:04:21 Alex Boyle ([id number]) -------------------------------------------------------------------------------- Patient/Caregiver Education Details Patient Name: Alex Boyle Date of Service: 02/27/2021 1:00 PM Medical Record Number: [id number] Patient Account Number: [id number] Date of Birth/Gender: 1946/01/24 (75 y.o. M) Treating RN: Donnamarie Poag Primary Care Physician: Abelino Derrick Other Clinician: Referring Physician: Abelino Derrick Treating Physician/Extender: Skipper Cliche in Treatment: 5 Education Assessment Education Provided To: Patient Education Topics Provided Basic Hygiene: Wound/Skin Impairment: Electronic Signature(s) Signed: 02/27/2021 3:50:58 PM By: Donnamarie Poag Entered By: Donnamarie Poag on 02/27/2021 13:35:25 Alex Boyle ([id number]) -------------------------------------------------------------------------------- Wound Assessment Details Patient Name: Alex Boyle Date of Service: 02/27/2021 1:00 PM Medical Record Number: [id number] Patient Account Number: [id number] Date of Birth/Sex: 10-30-1945 (75 y.o. M) Treating RN: Donnamarie Poag Primary Care Otha Rickles: Abelino Derrick Other Clinician: Referring Junah Yam: Abelino Derrick Treating Kristinia Leavy/Extender: Skipper Cliche in Treatment: 5 Wound Status Wound Number: 1 Primary Open Surgical Wound Etiology: Wound Location: Right Toe Second Wound Status: Open Wounding Event: Surgical Injury Comorbid Asthma, Sleep Apnea, Hypertension, Myocardial Date Acquired: 06/11/2020 History: Infarction, Neuropathy Weeks Of Treatment: 5 Clustered Wound: No Pending Amputation On Presentation Photos Wound Measurements Length: (cm) 0.2 Width: (cm) 0.2 Depth: (cm)  0.3 Area: (cm) 0.031 Volume: (cm) 0.009 % Reduction in Area: 84.2% % Reduction in Volume: 88.6% Epithelialization: Small (1-33%) Tunneling: No Undermining: No Wound Description Classification: Full Thickness With Exposed Support Structures Exudate Amount: Medium Exudate Type: Serosanguineous Exudate Color: red, brown Foul Odor After Cleansing: No Slough/Fibrino Yes Wound Bed Granulation Amount: Large (67-100%) Exposed Structure Granulation Quality: Red, Pink Fascia Exposed: No Necrotic Amount: Small (1-33%) Fat Layer (Subcutaneous Tissue) Exposed: Yes Necrotic Quality: Adherent Slough Tendon Exposed: Yes Muscle Exposed: No Joint Exposed: No Bone Exposed: Yes Treatment Notes Wound #1 (Toe Second) Wound Laterality: Right Cleanser Normal Saline Discharge Instruction: Wash your hands with soap and water. Remove old dressing, discard into plastic bag and place into trash. Cleanse the wound with Normal Saline prior to applying a clean dressing using gauze sponges, not tissues or cotton balls. Do not scrub or use excessive force. Pat dry using gauze sponges, not tissue or cotton balls. Alex Boyle, Alex Boyle ([id number]) Peri-Wound Care Topical Primary Dressing Hydrofera Blue Classic Foam Rope Dressing, 9x6 (mm/in) Discharge Instruction: cut the dressing to fit, then moisten with saline after placing it into the wound Secondary Dressing [place] Roll-Small Discharge Instruction: [place] as directed Gauze Discharge Instruction: Apply over prisma to cover Secured With [employer] Tape, 2x2 (in/yd) Discharge Instruction: Secure conform Compression Wrap Compression Stockings Add-Ons Electronic Signature(s) Signed: 02/27/2021  3:50:58 PM By: Donnamarie Poag Entered By: Donnamarie Poag on 02/27/2021 13:08:09 Alex Boyle ([id number]) -------------------------------------------------------------------------------- Wanship  Details Patient Name: Alex Boyle Date of Service: 02/27/2021 1:00 PM Medical Record Number: [id number] Patient Account Number: [id number] Date of Birth/Sex: [birth year] (75 y.o. M) Treating RN: Donnamarie Poag Primary Care Passion Lavin: Abelino Derrick Other Clinician: Referring Rhyse Skowron: Abelino Derrick Treating Wasil Wolke/Extender: Skipper Cliche in Treatment: 5 Vital Signs Time Taken: 13:03 Temperature (F): 98.0 Weight (lbs): 220 Pulse (bpm): 71 Respiratory Rate (breaths/min): 16 Blood Pressure (mmHg): 138/73 Reference Range: 80 - 120 mg / dl Electronic Signature(s) Signed: 02/27/2021 3:50:58 PM By: Donnamarie Poag Entered ByDonnamarie Poag on 02/27/2021 13:04:12

## 2021-03-06 ENCOUNTER — Other Ambulatory Visit: Payer: Self-pay

## 2021-03-06 ENCOUNTER — Encounter: Payer: Federal, State, Local not specified - PPO | Admitting: Internal Medicine

## 2021-03-06 DIAGNOSIS — L97519 Non-pressure chronic ulcer of other part of right foot with unspecified severity: Secondary | ICD-10-CM | POA: Diagnosis not present

## 2021-03-06 DIAGNOSIS — I10 Essential (primary) hypertension: Secondary | ICD-10-CM | POA: Diagnosis not present

## 2021-03-06 DIAGNOSIS — T8131XA Disruption of external operation (surgical) wound, not elsewhere classified, initial encounter: Secondary | ICD-10-CM | POA: Diagnosis not present

## 2021-03-06 DIAGNOSIS — L97516 Non-pressure chronic ulcer of other part of right foot with bone involvement without evidence of necrosis: Secondary | ICD-10-CM | POA: Diagnosis not present

## 2021-03-06 DIAGNOSIS — I2510 Atherosclerotic heart disease of native coronary artery without angina pectoris: Secondary | ICD-10-CM | POA: Diagnosis not present

## 2021-03-06 NOTE — Progress Notes (Signed)
Alex, Boyle ([id number]) Visit Report for 03/06/2021 Debridement Details Patient Name: Alex, Boyle. Date of Service: 03/06/2021 1:00 PM Medical Record Number: [id number] Patient Account Number: [id number] Date of Birth/Sex: Nov 26, 1945 (75 y.o. M) Treating RN: Donnamarie Poag Primary Care Provider: Abelino Derrick Other Clinician: Referring Provider: Abelino Derrick Treating Provider/Extender: Tito Dine in Treatment: 6 Debridement Performed for Wound #1 Right Toe Second Assessment: Performed By: Physician Ricard Dillon, MD Debridement Type: Debridement Level of Consciousness (Pre- Awake and Alert procedure): Pre-procedure Verification/Time Out Yes - 13:49 Taken: Start Time: 13:49 Pain Control: Lidocaine Total Area Debrided (L x W): 0.2 (cm) x 0.3 (cm) = 0.06 (cm) Tissue and other material Viable, Non-Viable, Slough, Subcutaneous, Slough debrided: Level: Skin/Subcutaneous Tissue Debridement Description: Excisional Instrument: Curette Bleeding: Minimum Hemostasis Achieved: Pressure Response to Treatment: Procedure was tolerated well Level of Consciousness (Post- Awake and Alert procedure): Post Debridement Measurements of Total Wound Length: (cm) 0.3 Width: (cm) 0.3 Depth: (cm) 0.3 Volume: (cm) 0.021 Character of Wound/Ulcer Post Debridement: Improved Post Procedure Diagnosis Same as Pre-procedure Electronic Signature(s) Signed: 03/06/2021 4:23:07 PM By: Linton Ham MD Signed: 03/06/2021 4:33:20 PM By: Donnamarie Poag Entered By: Linton Ham on 03/06/2021 13:57:33 Alex Boyle ([id number]) -------------------------------------------------------------------------------- HPI Details Patient Name: Alex Boyle Date of Service: 03/06/2021 1:00 PM Medical Record Number: [id number] Patient Account Number: [id number] Date of Birth/Sex: 04-02-1946 (75 y.o. M) Treating RN: Donnamarie Poag Primary Care Provider: Abelino Derrick Other  Clinician: Referring Provider: Abelino Derrick Treating Provider/Extender: Tito Dine in Treatment: 6 History of Present Illness HPI Description: 01/20/2021 upon evaluation today patient appears to be doing somewhat poorly in regard to a surgery that he had that was actually on 06/10/2020. He tells me that the toe was subsequently a hammertoe and this needed to be corrected by surgical intervention. When they got and the joint was so deteriorated and ended up having to actually pin and fuse the toe. This no longer Britta Mccreedy has given him some trouble he tells me. With that being said he has been seeing [facility], Dr. Sherryle Lis, and the only thing that is really been mentioned is going to try to remove a portion of the toe in order to basically it sounds like refuse this. Nonetheless I really do not know that the patient is interested in going down that road at this point based on what I am hearing. He does have noted culture on 12/22/2020 which was positive for Serratia. Nonetheless he has been on doxycycline though that just ended today. I would probably extend this for him due to the fact that I want him to be on this plenty in order to justify a good trial of antibiotics as well as wound care before potential for hyperbarics if he does indeed have osteomyelitis noted. The good news is his ABIs are on the right 1.38 with a TBI of 0.79 and on the left 1.21 with a TBI of 0.70. This was on 11/23/2020. Notably the patient is obviously very frustrated with the situation he has not had an MRI at this point since the surgery. I think that is really what we need to look into doing. Recently well8/22/2022 upon evaluation today patient appears to be doing regard to his toe ulcer. This is definitely not significantly improved compared to previous but is also not doing any worse to be honest. I did review his MRI today as well. With that being said I do not see any signs of definitive  osteomyelitis  currently. There was some mention of abnormal T1 and T2 signal intensity in the proximal and middle phalange ease of the second toe which could be due to recent surgery/stress reaction although osteomyelitis could not be excluded. Being that the bone at this location seems to be very solid there is no signs of flaking and in general the patient's toe does not appear to be showing signs of significant warmth with the erythema although there is some redness I think this is more just inflammatory as a result of the surgery. My gut feeling is that this does not represent osteomyelitis based on what I am seeing physically as well. The MRI also does not identify any signs of infection anywhere else in the foot which is also good news. In general this is very reassuring in my opinion. All of this was discussed with the patient during the office visit today. 02/06/2021 upon evaluation today patient's toe actually appears to be doing decently well today. There is still bone noted in the base of the toe but in general I think we are making some progress and this seems to be closing in quite nicely. With that being said he is going require little bit of debridement today. Also did discuss with him that we got the results back from the reaching out to the insurance company about the [place] unfortunately that was denied as a not a covered service. In the meantime I think continuing with the collagen is probably the best way to go. 02/14/2021 upon evaluation today patient appears to be doing well with regard to his toe ulcer. He has been tolerating the dressing changes without complication. Fortunately I do think that though he is doing better and this looks much better no longer erythematous the doxycycline in that regard. Nonetheless I do believe that he is still having some issues here to be honest with some necrotic debris buildup and we are getting need to clean this away. He is in agreement with that  plan. 02/20/2021 upon evaluation today patient's toe actually again showed signs of the collagen being somewhat dried into the wound trapping fluid underneath. I think this is actually more detrimental than beneficial. There is some additional granulation today which is good news but in general I feel like the biggest issue is that we need to do what we can to try to prevent having ongoing and continued issues with this occurring. For that reason I think switching to [facility] Blue rope that we can put a small piece into the wound and then moisten would be the ideal thing to fill the space and prevent fluid trapping. 02/27/2021 upon evaluation today patient's toe ulcer actually showing some signs of improvement. This is very slow to progress but nonetheless I do feel like he is showing signs of greater improvement especially compared to where we started is also really not showing any signs of infection which is also great news. Overall I am extremely pleased with where we stand today. 9/26; right second DIP. Initially a surgical wound to straighten a hammer deformity. Apparently this was done in December 2021. Still a small probing hole at 1 point this went down to bone. His wife reports she can no longer get Hydrofera Blue packing rope into the wound Electronic Signature(s) Signed: 03/06/2021 4:23:07 PM By: Linton Ham MD Entered By: Linton Ham on 03/06/2021 13:58:50 Alex Boyle ([id number]) -------------------------------------------------------------------------------- Physical Exam Details Patient Name: Alex Boyle Date of Service: 03/06/2021 1:00 PM Medical Record Number: [id number]  Patient Account Number: [id number] Date of Birth/Sex: 03/31/1946 (75 y.o. M) Treating RN: Donnamarie Poag Primary Care Provider: Abelino Derrick Other Clinician: Referring Provider: Abelino Derrick Treating Provider/Extender: Tito Dine in Treatment: 6 Constitutional Sitting or standing  Blood Pressure is within target range for patient.. Pulse regular and within target range for patient.Marland Kitchen Respirations regular, non- labored and within target range.. Temperature is normal and within the target range for the patient.Marland Kitchen appears in no distress. Notes Wound exam; DIP dorsally. Deep probing wound. I used a #3 curette to remove surface slough and subcutaneous debris. At the base postdebridement this actually looks healthy there is granulation. I could not identify the bone with a Q-tip. There is no erythema Electronic Signature(s) Signed: 03/06/2021 4:23:07 PM By: Linton Ham MD Entered By: Linton Ham on 03/06/2021 14:01:36 Alex Boyle ([id number]) -------------------------------------------------------------------------------- Physician Orders Details Patient Name: Alex Boyle Date of Service: 03/06/2021 1:00 PM Medical Record Number: [id number] Patient Account Number: [id number] Date of Birth/Sex: 06-10-1946 (75 y.o. M) Treating RN: Donnamarie Poag Primary Care Provider: Abelino Derrick Other Clinician: Referring Provider: Abelino Derrick Treating Provider/Extender: Tito Dine in Treatment: 6 Verbal / Phone Orders: No Diagnosis Coding Follow-up Appointments o Return Appointment in 1 week. Bathing/ Shower/ Hygiene o May shower; gently cleanse wound with antibacterial soap, rinse and pat dry prior to dressing wounds o No tub bath. Edema Control - Lymphedema / Segmental Compressive Device / Other o Elevate leg(s) parallel to the floor when sitting. o DO YOUR BEST to sleep in the bed at night. DO NOT sleep in your recliner. Long hours of sitting in a recliner leads to swelling of the legs and/or potential wounds on your backside. Off-Loading Wound #1 Right Toe Second o Other: - KEEP PRESSURE OFF OF FOOT/WOUND Medications-Please add to medication list. o P.O. Antibiotics - Extend doxycycline until finished as ordered Wound Treatment Wound  #1 - Toe Second Wound Laterality: Right Cleanser: Normal Saline Every Other Day/30 Days Discharge Instructions: Wash your hands with soap and water. Remove old dressing, discard into plastic bag and place into trash. Cleanse the wound with Normal Saline prior to applying a clean dressing using gauze sponges, not tissues or cotton balls. Do not scrub or use excessive force. Pat dry using gauze sponges, not tissue or cotton balls. Primary Dressing: Endoform 2x2 (in/in) Every Other Day/30 Days Discharge Instructions: Apply Endoform as directed Secondary Dressing: Conforming Guaze Roll-Small (Generic) Every Other Day/30 Days Discharge Instructions: [place] as directed Secondary Dressing: Gauze Every Other Day/30 Days Secured With: 85M Holcomb Surgical Tape, 2x2 (in/yd) (Generic) Every Other Day/30 Days Discharge Instructions: Secure conform Electronic Signature(s) Signed: 03/06/2021 4:23:07 PM By: Linton Ham MD Signed: 03/06/2021 4:33:20 PM By: Donnamarie Poag Entered By: Donnamarie Poag on 03/06/2021 13:56:15 Alex Boyle ([id number]) -------------------------------------------------------------------------------- Problem List Details Patient Name: Alex Boyle Date of Service: 03/06/2021 1:00 PM Medical Record Number: [id number] Patient Account Number: [id number] Date of Birth/Sex: [birth year] (75 y.o. 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 01/20/2021 No Yes involvement without evidence of necrosis I10 Essential (primary)  hypertension 01/20/2021 No Yes I25.10 Atherosclerotic heart disease of native coronary artery  without angina 01/20/2021 No Yes pectoris Inactive Problems Resolved Problems Electronic Signature(s) Signed: 03/06/2021 4:23:07 PM By: Linton Ham MD Entered By: Linton Ham on 03/06/2021 13:56:24 Alex Boyle ([id number]) -------------------------------------------------------------------------------- Progress Note Details Patient Name: Alex Boyle Date of Service: 03/06/2021 1:00 PM Medical Record Number: [id number] Patient Account Number: [id number] Date of Birth/Sex: April 30, 1946 (75 y.o. M) Treating RN: Donnamarie Poag Primary Care Provider: Abelino Derrick Other Clinician: Referring Provider: Abelino Derrick Treating Provider/Extender: Tito Dine in Treatment: 6 Subjective History of Present Illness (HPI) 01/20/2021 upon evaluation today patient appears to be doing somewhat poorly in regard to a surgery that he had that was actually on 06/10/2020. He tells me that the toe was subsequently a hammertoe and this needed to be corrected by surgical intervention. When they got and the joint was so deteriorated and ended up having to actually pin and fuse the toe. This no longer Britta Mccreedy has given him some trouble he tells me. With that being said he has been seeing [facility], Dr. Sherryle Lis, and the only thing that is really been mentioned is going to try to remove a portion of the toe in order to basically it sounds like refuse this. Nonetheless I really do not know that the patient is interested in going down that road at this point based on what I am hearing. He does have noted culture on 12/22/2020 which was positive for Serratia. Nonetheless he has been on doxycycline though that just ended today. I would probably extend this for him due to the fact that I want him to be on this plenty in order to justify a good trial of antibiotics as well as wound care before potential  for hyperbarics if he does indeed have osteomyelitis noted. The good news is his ABIs are on the right 1.38 with a TBI of 0.79 and on the left 1.21 with a TBI of 0.70. This was on 11/23/2020. Notably the patient is obviously very frustrated with the situation he has not had an MRI at this point since the surgery. I think that is really what we need to look into doing. Recently well8/22/2022 upon evaluation today patient appears to be doing regard to his toe ulcer. This is definitely not significantly improved compared to previous but is also not doing any worse to be honest. I did review his MRI today as well. With that being said I do not see any signs of definitive osteomyelitis currently. There was some mention of abnormal T1 and T2 signal intensity in the proximal and middle phalange ease of the second toe which could be due to recent surgery/stress reaction although osteomyelitis could not be excluded. Being that the bone at this location seems to be very solid there is no signs of flaking and in general the patient's toe does not appear to be showing signs of significant warmth with the erythema although there is some redness I think this is more just inflammatory as a result of the surgery. My gut feeling is that this does not represent osteomyelitis based on what I am seeing physically as well. The MRI also does not identify any signs of infection anywhere else in the foot which is also good news. In general this is very reassuring in my opinion. All of this was discussed with the patient during the office visit today. 02/06/2021 upon evaluation today patient's toe actually appears to be doing decently well today. There is still bone noted in the base of the toe but  in general I think we are making some progress and this seems to be closing in quite nicely. With that being said he is going require little bit of debridement today. Also did discuss with him that we got the results back from the  reaching out to the insurance company about the [place] unfortunately that was denied as a not a covered service. In the meantime I think continuing with the collagen is probably the best way to go. 02/14/2021 upon evaluation today patient appears to be doing well with regard to his toe ulcer. He has been tolerating the dressing changes without complication. Fortunately I do think that though he is doing better and this looks much better no longer erythematous the doxycycline in that regard. Nonetheless I do believe that he is still having some issues here to be honest with some necrotic debris buildup and we are getting need to clean this away. He is in agreement with that plan. 02/20/2021 upon evaluation today patient's toe actually again showed signs of the collagen being somewhat dried into the wound trapping fluid underneath. I think this is actually more detrimental than beneficial. There is some additional granulation today which is good news but in general I feel like the biggest issue is that we need to do what we can to try to prevent having ongoing and continued issues with this occurring. For that reason I think switching to [facility] Blue rope that we can put a small piece into the wound and then moisten would be the ideal thing to fill the space and prevent fluid trapping. 02/27/2021 upon evaluation today patient's toe ulcer actually showing some signs of improvement. This is very slow to progress but nonetheless I do feel like he is showing signs of greater improvement especially compared to where we started is also really not showing any signs of infection which is also great news. Overall I am extremely pleased with where we stand today. 9/26; right second DIP. Initially a surgical wound to straighten a hammer deformity. Apparently this was done in December 2021. Still a small probing hole at 1 point this went down to bone. His wife reports she can no longer get Hydrofera Blue packing rope  into the wound Objective Constitutional Sitting or standing Blood Pressure is within target range for patient.. Pulse regular and within target range for patient.Marland Kitchen Respirations regular, non- labored and within target range.. Temperature is normal and within the target range for the patient.Marland Kitchen appears in no distress. Vitals Time Taken: 1:20 PM, Weight: 220 lbs, Temperature: 97.9 F, Pulse: 79 bpm, Respiratory Rate: 16 breaths/min, Blood Pressure: 130/61 mmHg. KARIEM, WOLFSON ([id number]) General Notes: Wound exam; DIP dorsally. Deep probing wound. I used a #3 curette to remove surface slough and subcutaneous debris. At the base postdebridement this actually looks healthy there is granulation. I could not identify the bone with a Q-tip. There is no erythema Integumentary (Hair, Skin) Wound #1 status is Open. Original cause of wound was Surgical Injury. The date acquired was: 06/11/2020. The wound has been in treatment 6 weeks. The wound is located on the Right Toe Second. The wound measures 0.2cm length x 0.3cm width x 0.3cm depth; 0.047cm^2 area and 0.014cm^3 volume. There is tendon and Fat Layer (Subcutaneous Tissue) exposed. There is no tunneling or undermining noted. There is a medium amount of serosanguineous drainage noted. There is small (1-33%) red, pink granulation within the wound bed. There is a large (67- 100%) amount of necrotic tissue within the wound bed  including [place]. 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 There was a Excisional  Skin/Subcutaneous Tissue Debridement with a total area of 0.06 sq cm performed by Ricard Dillon, MD. With the following instrument(s): Curette to remove Viable and Non-Viable tissue/material. Material removed includes Subcutaneous Tissue and Slough and after achieving pain control using Lidocaine. A time out was conducted at 13:49, prior to the start of the procedure. A Minimum amount of bleeding was controlled with Pressure. The procedure was tolerated well. Post Debridement Measurements: 0.3cm length x 0.3cm width x 0.3cm depth; 0.021cm^3 volume. Character of Wound/Ulcer Post Debridement is improved. Post procedure Diagnosis Wound #1: Same as Pre-Procedure Plan Follow-up Appointments: Return Appointment in 1 week. Bathing/ Shower/ Hygiene: May shower; gently cleanse wound with antibacterial soap, rinse and pat dry prior to dressing wounds No tub bath. Edema Control - Lymphedema / Segmental Compressive Device / Other: Elevate leg(s) parallel to the floor when sitting. DO YOUR BEST to sleep in the bed at night. DO NOT sleep in your recliner. Long hours of sitting in a recliner leads to swelling of the legs and/or potential wounds on your backside. Off-Loading: Wound #1 Right Toe Second: Other: - KEEP PRESSURE OFF OF FOOT/WOUND Medications-Please add to medication list.: P.O. Antibiotics - Extend doxycycline until finished as ordered WOUND #1: - Toe Second Wound Laterality: Right Cleanser: Normal Saline Every Other Day/30 Days Discharge Instructions: Wash your hands with soap and water. Remove old dressing, discard into plastic bag and place into trash. Cleanse the wound with Normal Saline prior to applying a clean dressing using gauze sponges, not tissues or cotton balls. Do not scrub or use excessive force. Pat dry using gauze sponges, not tissue or cotton balls. Primary Dressing: Endoform 2x2 (in/in) Every Other Day/30 Days Discharge Instructions: Apply Endoform as  directed Secondary Dressing: Conforming Guaze Roll-Small (Generic) Every Other Day/30 Days Discharge Instructions: [place] as directed Secondary Dressing: Gauze Every Other Day/30 Days Secured With: 51M Medipore H Soft Cloth Surgical Tape, 2x2 (in/yd) (Generic) Every Other Day/30 Days Discharge Instructions: Secure conform INEZ, ROSATO ([id number]) 1. I can see where this would be difficult to even cut small pieces of Hydrofera Blue rope. 2 moistened endoform 3. He is completing doxycycline. Electronic Signature(s) Signed: 03/06/2021 4:23:07 PM By: Linton Ham MD Entered By: Linton Ham on 03/06/2021 14:03:10 Alex Boyle ([id number]) -------------------------------------------------------------------------------- SuperBill Details Patient Name: Alex Boyle Date of Service: 03/06/2021 Medical Record Number: [id number] Patient Account Number: [id number] Date of Birth/Sex: Oct 04, 1945 (75 y.o. 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 I10 Essential (primary) hypertension I25.10 Atherosclerotic heart disease of native coronary artery without angina pectoris Facility Procedures CPT4 Code Description: [id number] 11042 - DEB SUBQ TISSUE 20 SQ CM/< Modifier: Quantity: 1 CPT4 Code Description: ICD-10 Diagnosis Description L97.516 Non-pressure chronic ulcer of other part of right foot with  bone involvement Modifier: without evidence Quantity: of necrosis Physician Procedures CPT4 Code Description: [id number] 11042 - WC PHYS SUBQ TISS 20 SQ  CM Modifier: Quantity: 1 CPT4 Code Description: ICD-10 Diagnosis Description 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

## 2021-03-06 NOTE — Progress Notes (Signed)
Alex Boyle, Alex Boyle ([id number]) Visit Report for 03/06/2021 Arrival Information Details Patient Name: Alex Boyle, Alex Boyle. Date of Service: 03/06/2021 1:00 PM Medical Record Number: [id number] Patient Account Number: [id number] Date of Birth/Sex: Nov 06, 1945 (75 y.o. M) Treating RN: Donnamarie Poag Primary Care Ruhaan Nordahl: Abelino Derrick Other Clinician: Referring Rhonda Vangieson: Abelino Derrick Treating Joelynn Dust/Extender: Tito Dine in Treatment: 6 Visit Information History Since Last Visit Added or deleted any medications: No Patient Arrived: Ambulatory Had a fall or experienced change in No Arrival Time: 13:18 activities of daily living that may affect Accompanied By: self risk of falls: Transfer Assistance: None Hospitalized since last visit: No Patient Identification Verified: Yes Has Dressing in Place as Prescribed: Yes Secondary Verification Process Completed: Yes Pain Present Now: No Electronic Signature(s) Signed: 03/06/2021 4:33:20 PM By: Donnamarie Poag Entered By: Donnamarie Poag on 03/06/2021 13:20:26 Alex Boyle ([id number]) -------------------------------------------------------------------------------- Clinic Level of Care Assessment Details Patient Name: Alex Boyle Date of Service: 03/06/2021 1:00 PM Medical Record Number: [id number] Patient Account Number: [id number] Date of Birth/Sex: 03/14/1946 (75 y.o. M) Treating RN: Donnamarie Poag Primary Care Shanta Dorvil: Abelino Derrick Other Clinician: Referring Laverna Dossett: Abelino Derrick Treating Donell Tomkins/Extender: Tito Dine in Treatment: 6 Clinic Level of Care Assessment Items TOOL 1 Quantity Score []  - Use when EandM and Procedure is performed on INITIAL visit 0 ASSESSMENTS - Nursing Assessment / Reassessment []  - General Physical Exam (combine w/ comprehensive assessment (listed just below) when performed on new 0 pt. evals) []  - 0 Comprehensive Assessment (HX, ROS, Risk Assessments, Wounds Hx, etc.) ASSESSMENTS  - Wound and Skin Assessment / Reassessment []  - Dermatologic / Skin Assessment (not related to wound area) 0 ASSESSMENTS - Ostomy and/or Continence Assessment and Care []  - Incontinence Assessment and Management 0 []  - 0 Ostomy Care Assessment and Management (repouching, etc.) PROCESS - Coordination of Care []  - Simple Patient / Family Education for ongoing care 0 []  - 0 Complex (extensive) Patient / Family Education for ongoing care []  - 0 Staff obtains Programmer, systems, Records, Test Results / Process Orders []  - 0 Staff telephones HHA, Nursing Homes / Clarify orders / etc []  - 0 Routine Transfer to another Facility (non-emergent condition) []  - 0 Routine Hospital Admission (non-emergent condition) []  - 0 New Admissions / Biomedical engineer / Ordering NPWT, Apligraf, etc. []  - 0 Emergency Hospital Admission (emergent condition) PROCESS - Special Needs []  - Pediatric / Minor Patient Management 0 []  - 0 Isolation Patient Management []  - 0 Hearing / Language / Visual special needs []  - 0 Assessment of Community assistance (transportation, D/C planning, etc.) []  - 0 Additional assistance / Altered mentation []  - 0 Support Surface(s) Assessment (bed, cushion, seat, etc.) INTERVENTIONS - Miscellaneous []  - External ear exam 0 []  - 0 Patient Transfer (multiple staff / Civil Service fast streamer / Similar devices) []  - 0 Simple Staple / Suture removal (25 or less) []  - 0 Complex Staple / Suture removal (26 or more) []  - 0 Hypo/Hyperglycemic Management (do not check if billed separately) []  - 0 Ankle / Brachial Index (ABI) - do not check if billed separately Has the patient been seen at the hospital within the last three years: Yes Total Score: 0 Level Of Care: ____ Alex Boyle ([id number]) Electronic Signature(s) Signed: 03/06/2021 4:33:20 PM By: Donnamarie Poag Entered By: Donnamarie Poag on 03/06/2021 13:52:05 Alex Boyle  ([id number]) -------------------------------------------------------------------------------- Encounter Discharge Information Details Patient Name: Alex Boyle Date of Service: 03/06/2021 1:00 PM Medical Record Number: [id number] Patient Account Number: [id number]  Date of Birth/Sex: 13-Jul-1945 (75 y.o. M) Treating RN: Donnamarie Poag Primary Care Pascuala Klutts: Abelino Derrick Other Clinician: Referring Ivianna Notch: Abelino Derrick Treating Theona Muhs/Extender: Tito Dine in Treatment: 6 Encounter Discharge Information Items Post Procedure Vitals Discharge Condition: Stable Temperature (F): 97.9 Ambulatory Status: Ambulatory Pulse (bpm): 79 Discharge Destination: Home Respiratory Rate (breaths/min): 16 Transportation: Private Auto Blood Pressure (mmHg): 130/61 Accompanied By: self Schedule Follow-up Appointment: Yes Clinical Summary of Care: Electronic Signature(s) Signed: 03/06/2021 4:33:20 PM By: Donnamarie Poag Entered By: Donnamarie Poag on 03/06/2021 14:00:43 Alex Boyle ([id number]) -------------------------------------------------------------------------------- Lower Extremity Assessment Details Patient Name: Alex Boyle Date of Service: 03/06/2021 1:00 PM Medical Record Number: [id number] Patient Account Number: [id number] Date of Birth/Sex: 1946-02-05 (75 y.o. M) Treating RN: Donnamarie Poag Primary Care Najee Cowens: Abelino Derrick Other Clinician: Referring Sora Vrooman: Abelino Derrick Treating Amberleigh Gerken/Extender: Ricard Dillon Weeks in Treatment: 6 Edema Assessment Assessed: [Left: No] [Right: Yes] [Left: Edema] [Right: :] Vascular Assessment Pulses: Dorsalis Pedis Palpable: [Right:Yes] Electronic Signature(s) Signed: 03/06/2021 4:33:20 PM By: Donnamarie Poag Entered By: Donnamarie Poag on 03/06/2021 13:25:12 Alex Boyle ([id number]) -------------------------------------------------------------------------------- Multi Wound Chart Details Patient Name: Alex Boyle Date of Service: 03/06/2021 1:00 PM Medical Record Number: [id number] Patient Account Number: [id number] Date of Birth/Sex: Mar 11, 1946 (75 y.o. M) Treating RN: Donnamarie Poag Primary Care Devra Stare: Abelino Derrick Other Clinician: Referring Friedrich Harriott: Abelino Derrick Treating Jorell Agne/Extender: Tito Dine in Treatment: 6 Vital Signs Height(in): Pulse(bpm): 78 Weight(lbs): 220 Blood Pressure(mmHg): 130/61 Body Mass Index(BMI): Temperature(F): 97.9 Respiratory Rate(breaths/min): 16 Photos: [N/A:N/A] Wound Location: Right Toe Second N/A N/A Wounding Event: Surgical Injury N/A N/A Primary Etiology: Open Surgical Wound N/A N/A Comorbid History: Asthma, Sleep Apnea, N/A N/A Hypertension, Myocardial Infarction, Neuropathy Date Acquired: 06/11/2020 N/A N/A Weeks of Treatment: 6 N/A N/A Wound Status: Open N/A N/A Pending Amputation on Yes N/A N/A Presentation: Measurements L x W x D (cm) 0.2x0.3x0.3 N/A N/A Area (cm) : 0.047 N/A N/A Volume (cm) : 0.014 N/A N/A % Reduction in Area: 76.00% N/A N/A % Reduction in Volume: 82.30% N/A N/A Classification: Full Thickness With Exposed N/A N/A Support Structures Exudate Amount: Medium N/A N/A Exudate Type: Serosanguineous N/A N/A Exudate Color: red, brown N/A N/A Granulation Amount: Small (1-33%) N/A N/A Granulation Quality: Red, Pink N/A N/A Necrotic Amount: Large (67-100%) N/A N/A Exposed Structures: Fat Layer (Subcutaneous Tissue): N/A N/A Yes Tendon: Yes Fascia: No Muscle: No Joint: No Bone: No Epithelialization: Small (1-33%) N/A N/A Debridement: Debridement - Excisional N/A N/A Pre-procedure Verification/Time 13:49 N/A N/A Out Taken: Pain Control: Lidocaine N/A N/A Tissue Debrided: Subcutaneous, Slough N/A N/A Level: Skin/Subcutaneous Tissue N/A N/A Debridement Area (sq cm): 0.06 N/A N/A Instrument: Curette N/A N/A Bleeding: Minimum N/A N/A Hemostasis Achieved: Pressure N/A N/A Procedure was tolerated well  N/A N/A Alex Boyle, Alex Boyle ([id number]) Debridement Treatment Response: Post Debridement 0.3x0.3x0.3 N/A N/A Measurements L x W x D (cm) Post Debridement Volume: 0.021 N/A N/A (cm) Procedures Performed: Debridement N/A N/A Treatment Notes Electronic Signature(s) Signed: 03/06/2021 4:23:07 PM By: Linton Ham MD Entered By: Linton Ham on 03/06/2021 13:56:48 Alex Boyle ([id number]) -------------------------------------------------------------------------------- Multi-Disciplinary Care Plan Details Patient Name: Alex Boyle Date of Service: 03/06/2021 1:00 PM Medical Record Number: [id number] Patient Account Number: [id number] Date of Birth/Sex: November 20, 1945 (75 y.o. M) Treating RN: Donnamarie Poag Primary Care Chevelle Durr: Abelino Derrick Other Clinician: Referring Karan Ramnauth: Abelino Derrick Treating Varnell Orvis/Extender: Tito Dine in Treatment: 6 Active Inactive Wound/Skin Impairment Nursing Diagnoses: Impaired tissue integrity Goals: Patient/caregiver will verbalize understanding  of skin care regimen Date Initiated: 01/20/2021 Date Inactivated: 02/14/2021 Target Resolution Date: 01/20/2021 Goal Status: Met Ulcer/skin breakdown will have a volume reduction of 30% by week 4 Date Initiated: 01/20/2021 Target Resolution Date: 02/20/2021 Goal Status: Active Ulcer/skin breakdown will have a volume reduction of 50% by week 8 Date Initiated: 01/20/2021 Target Resolution Date: 03/22/2021 Goal Status: Active Ulcer/skin breakdown will have a volume reduction of 80% by week 12 Date Initiated: 01/20/2021 Target Resolution Date: 04/22/2021 Goal Status: Active Ulcer/skin breakdown will heal within 14 weeks Date Initiated: 01/20/2021 Target Resolution Date: 05/22/2021 Goal Status: Active Interventions: Assess patient/caregiver ability to obtain necessary supplies Assess patient/caregiver ability to perform ulcer/skin care regimen upon admission and as needed Assess  ulceration(s) every visit Provide education on ulcer and skin care Treatment Activities: Skin care regimen initiated : 01/20/2021 Notes: Electronic Signature(s) Signed: 03/06/2021 4:33:20 PM By: Donnamarie Poag Entered By: Donnamarie Poag on 03/06/2021 13:25:25 Alex Boyle ([id number]) -------------------------------------------------------------------------------- Pain Assessment Details Patient Name: Alex Boyle Date of Service: 03/06/2021 1:00 PM Medical Record Number: [id number] Patient Account Number: [id number] Date of Birth/Sex: October 04, 1945 (75 y.o. M) Treating RN: Donnamarie Poag Primary Care Chelsey Kimberley: Abelino Derrick Other Clinician: Referring Eusebio Blazejewski: Abelino Derrick Treating Vedanshi Massaro/Extender: Tito Dine in Treatment: 6 Active Problems Location of Pain Severity and Description of Pain Patient Has Paino No Site Locations Rate the pain. Current Pain Level: 0 Pain Management and Medication Current Pain Management: Electronic Signature(s) Signed: 03/06/2021 4:33:20 PM By: Donnamarie Poag Entered By: Donnamarie Poag on 03/06/2021 13:21:43 Alex Boyle ([id number]) -------------------------------------------------------------------------------- Patient/Caregiver Education Details Patient Name: Alex Boyle Date of Service: 03/06/2021 1:00 PM Medical Record Number: [id number] Patient Account Number: [id number] Date of Birth/Gender: 04-25-1946 (75 y.o. M) Treating RN: Donnamarie Poag Primary Care Physician: Abelino Derrick Other Clinician: Referring Physician: Abelino Derrick Treating Physician/Extender: Tito Dine in Treatment: 6 Education Assessment Education Provided To: Patient Education Topics Provided Basic Hygiene: Wound Debridement: Wound/Skin Impairment: Electronic Signature(s) Signed: 03/06/2021 4:33:20 PM By: Donnamarie Poag Entered By: Donnamarie Poag on 03/06/2021 13:49:14 Alex Boyle  ([id number]) -------------------------------------------------------------------------------- Wound Assessment Details Patient Name: Alex Boyle Date of Service: 03/06/2021 1:00 PM Medical Record Number: [id number] Patient Account Number: [id number] Date of Birth/Sex: Oct 10, 1945 (75 y.o. M) Treating RN: Donnamarie Poag Primary Care Darvis Croft: Abelino Derrick Other Clinician: Referring Travus Oren: Abelino Derrick Treating Dakiya Puopolo/Extender: Tito Dine in Treatment: 6 Wound Status Wound Number: 1 Primary Open Surgical Wound Etiology: Wound Location: Right Toe Second Wound Status: Open Wounding Event: Surgical Injury Comorbid Asthma, Sleep Apnea, Hypertension, Myocardial Date Acquired: 06/11/2020 History: Infarction, Neuropathy Weeks Of Treatment: 6 Clustered Wound: No Pending Amputation On Presentation Photos Wound Measurements Length: (cm) 0.2 Width: (cm) 0.3 Depth: (cm) 0.3 Area: (cm) 0.047 Volume: (cm) 0.014 % Reduction in Area: 76% % Reduction in Volume: 82.3% Epithelialization: Small (1-33%) Tunneling: No Undermining: No Wound Description Classification: Full Thickness With Exposed Support Structures Exudate Amount: Medium Exudate Type: Serosanguineous Exudate Color: red, brown Foul Odor After Cleansing: No Slough/Fibrino Yes Wound Bed Granulation Amount: Small (1-33%) Exposed Structure Granulation Quality: Red, Pink Fascia Exposed: No Necrotic Amount: Large (67-100%) Fat Layer (Subcutaneous Tissue) Exposed: Yes Necrotic Quality: Adherent Slough Tendon Exposed: Yes Muscle Exposed: No Joint Exposed: No Bone Exposed: No Treatment Notes Wound #1 (Toe Second) Wound Laterality: Right Cleanser Normal Saline Discharge Instruction: Wash your hands with soap and water. Remove old dressing, discard into plastic bag and place into trash. Cleanse the wound with Normal Saline prior to applying a clean  dressing using gauze sponges, not tissues or cotton balls. Do  not scrub or use excessive force. Pat dry using gauze sponges, not tissue or cotton balls. Alex Boyle, Alex Boyle ([id number]) Peri-Wound Care Topical Primary Dressing Endoform 2x2 (in/in) Discharge Instruction: Apply Endoform as directed Secondary Dressing Clarkson Roll-Small Discharge Instruction: Apply Conforming Stretch Guaze Bandage as directed Gauze Secured With 78M Medipore H Soft Cloth Surgical Tape, 2x2 (in/yd) Discharge Instruction: Secure conform Compression Wrap Compression Stockings Add-Ons Electronic Signature(s) Signed: 03/06/2021 4:33:20 PM By: Donnamarie Poag Entered By: Donnamarie Poag on 03/06/2021 13:54:16 Alex Boyle ([id number]) -------------------------------------------------------------------------------- Vitals Details Patient Name: Alex Boyle Date of Service: 03/06/2021 1:00 PM Medical Record Number: [id number] Patient Account Number: [id number] Date of Birth/Sex: 08-30-1945 (75 y.o. M) Treating RN: Donnamarie Poag Primary Care Carlitos Bottino: Abelino Derrick Other Clinician: Referring Larin Weissberg: Abelino Derrick Treating Damyiah Moxley/Extender: Tito Dine in Treatment: 6 Vital Signs Time Taken: 13:20 Temperature (F): 97.9 Weight (lbs): 220 Pulse (bpm): 79 Respiratory Rate (breaths/min): 16 Blood Pressure (mmHg): 130/61 Reference Range: 80 - 120 mg / dl Electronic Signature(s) Signed: 03/06/2021 4:33:20 PM By: Donnamarie Poag Entered ByDonnamarie Poag on 03/06/2021 13:21:17

## 2021-03-10 DIAGNOSIS — R972 Elevated prostate specific antigen [PSA]: Secondary | ICD-10-CM | POA: Diagnosis not present

## 2021-03-13 ENCOUNTER — Encounter: Payer: Federal, State, Local not specified - PPO | Admitting: Internal Medicine

## 2021-03-16 ENCOUNTER — Encounter: Payer: Medicare Other | Admitting: Podiatry

## 2021-03-21 ENCOUNTER — Ambulatory Visit: Payer: Medicare Other | Admitting: Physician Assistant

## 2021-03-21 ENCOUNTER — Encounter: Payer: Federal, State, Local not specified - PPO | Attending: Internal Medicine | Admitting: Internal Medicine

## 2021-03-21 ENCOUNTER — Other Ambulatory Visit: Payer: Self-pay

## 2021-03-21 DIAGNOSIS — I2510 Atherosclerotic heart disease of native coronary artery without angina pectoris: Secondary | ICD-10-CM | POA: Insufficient documentation

## 2021-03-21 DIAGNOSIS — L97516 Non-pressure chronic ulcer of other part of right foot with bone involvement without evidence of necrosis: Secondary | ICD-10-CM | POA: Insufficient documentation

## 2021-03-21 DIAGNOSIS — T8131XA Disruption of external operation (surgical) wound, not elsewhere classified, initial encounter: Secondary | ICD-10-CM | POA: Diagnosis not present

## 2021-03-21 DIAGNOSIS — I10 Essential (primary) hypertension: Secondary | ICD-10-CM | POA: Insufficient documentation

## 2021-03-21 NOTE — Progress Notes (Signed)
Alex, Boyle ([id number]) Visit Report for 03/21/2021 Debridement Details Patient Name: Alex Boyle, Alex Boyle. Date of Service: 03/21/2021 1:00 PM Medical Record Number: [id number] Patient Account Number: [id number] Date of Birth/Sex: Aug 24, 1945 (75 y.o. M) Treating RN: Donnamarie Poag Primary Care Provider: Abelino Derrick Other Clinician: Referring Provider: Abelino Derrick Treating Provider/Extender: Tito Dine in Treatment: 8 Debridement Performed for Wound #1 Right Toe Second Assessment: Performed By: Physician Ricard Dillon, MD Debridement Type: Debridement Level of Consciousness (Pre- Awake and Alert procedure): Pre-procedure Verification/Time Out Yes - 15:17 Taken: Start Time: 15:17 Pain Control: Lidocaine Total Area Debrided (L x W): 0.3 (cm) x 0.3 (cm) = 0.09 (cm) Tissue and other material Viable, Non-Viable, Subcutaneous debrided: Level: Skin/Subcutaneous Tissue Debridement Description: Excisional Instrument: Other : scalpel Bleeding: Minimum Hemostasis Achieved: Pressure Response to Treatment: Procedure was tolerated well Level of Consciousness (Post- Awake and Alert procedure): Post Debridement Measurements of Total Wound Length: (cm) 0.4 Width: (cm) 0.3 Depth: (cm) 0.3 Volume: (cm) 0.028 Character of Wound/Ulcer Post Debridement: Improved Post Procedure Diagnosis Same as Pre-procedure Electronic Signature(s) Signed: 03/21/2021 2:36:26 PM By: Donnamarie Poag Signed: 03/21/2021 3:54:48 PM By: Linton Ham MD Entered By: Linton Ham on 03/21/2021 13:22:49 Alex Boyle ([id number]) -------------------------------------------------------------------------------- HPI Details Patient Name: Alex Boyle Date of Service: 03/21/2021 1:00 PM Medical Record Number: [id number] Patient Account Number: [id number] Date of Birth/Sex: 1945-10-25 (75 y.o. M) Treating RN: Donnamarie Poag Primary Care Provider: Abelino Derrick Other Clinician: Referring  Provider: Abelino Derrick Treating Provider/Extender: Tito Dine in Treatment: 8 History of Present Illness HPI Description: 01/20/2021 upon evaluation today patient appears to be doing somewhat poorly in regard to a surgery that he had that was actually on 06/10/2020. He tells me that the toe was subsequently a hammertoe and this needed to be corrected by surgical intervention. When they got and the joint was so deteriorated and ended up having to actually pin and fuse the toe. This no longer Britta Mccreedy has given him some trouble he tells me. With that being said he has been seeing [facility], Dr. Sherryle Lis, and the only thing that is really been mentioned is going to try to remove a portion of the toe in order to basically it sounds like refuse this. Nonetheless I really do not know that the patient is interested in going down that road at this point based on what I am hearing. He does have noted culture on 12/22/2020 which was positive for Serratia. Nonetheless he has been on doxycycline though that just ended today. I would probably extend this for him due to the fact that I want him to be on this plenty in order to justify a good trial of antibiotics as well as wound care before potential for hyperbarics if he does indeed have osteomyelitis noted. The good news is his ABIs are on the right 1.38 with a TBI of 0.79 and on the left 1.21 with a TBI of 0.70. This was on 11/23/2020. Notably the patient is obviously very frustrated with the situation he has not had an MRI at this point since the surgery. I think that is really what we need to look into doing. Recently well8/22/2022 upon evaluation today patient appears to be doing regard to his toe ulcer. This is definitely not significantly improved compared to previous but is also not doing any worse to be honest. I did review his MRI today as well. With that being said I do not see any signs of definitive osteomyelitis currently.  There  was some mention of abnormal T1 and T2 signal intensity in the proximal and middle phalange ease of the second toe which could be due to recent surgery/stress reaction although osteomyelitis could not be excluded. Being that the bone at this location seems to be very solid there is no signs of flaking and in general the patient's toe does not appear to be showing signs of significant warmth with the erythema although there is some redness I think this is more just inflammatory as a result of the surgery. My gut feeling is that this does not represent osteomyelitis based on what I am seeing physically as well. The MRI also does not identify any signs of infection anywhere else in the foot which is also good news. In general this is very reassuring in my opinion. All of this was discussed with the patient during the office visit today. 02/06/2021 upon evaluation today patient's toe actually appears to be doing decently well today. There is still bone noted in the base of the toe but in general I think we are making some progress and this seems to be closing in quite nicely. With that being said he is going require little bit of debridement today. Also did discuss with him that we got the results back from the reaching out to the insurance company about the [place] unfortunately that was denied as a not a covered service. In the meantime I think continuing with the collagen is probably the best way to go. 02/14/2021 upon evaluation today patient appears to be doing well with regard to his toe ulcer. He has been tolerating the dressing changes without complication. Fortunately I do think that though he is doing better and this looks much better no longer erythematous the doxycycline in that regard. Nonetheless I do believe that he is still having some issues here to be honest with some necrotic debris buildup and we are getting need to clean this away. He is in agreement with that plan. 02/20/2021 upon  evaluation today patient's toe actually again showed signs of the collagen being somewhat dried into the wound trapping fluid underneath. I think this is actually more detrimental than beneficial. There is some additional granulation today which is good news but in general I feel like the biggest issue is that we need to do what we can to try to prevent having ongoing and continued issues with this occurring. For that reason I think switching to [facility] Blue rope that we can put a small piece into the wound and then moisten would be the ideal thing to fill the space and prevent fluid trapping. 02/27/2021 upon evaluation today patient's toe ulcer actually showing some signs of improvement. This is very slow to progress but nonetheless I do feel like he is showing signs of greater improvement especially compared to where we started is also really not showing any signs of infection which is also great news. Overall I am extremely pleased with where we stand today. 9/26; right second DIP. Initially a surgical wound to straighten a hammer deformity. Apparently this was done in December 2021. Still a small probing hole at 1 point this went down to bone. His wife reports she can no longer get Hydrofera Blue packing rope into the wound 10/11 right second DIP. Absolutely no improvement. This is a deep probing wound. It does not go to bone but there cannot be much tissue over this. This is just distal to the DIP itself. Last 2 weeks we have  been using endoform Electronic Signature(s) Signed: 03/21/2021 3:54:48 PM By: Linton Ham MD Entered By: Linton Ham on 03/21/2021 13:23:43 Alex Boyle ([id number]) -------------------------------------------------------------------------------- Physical Exam Details Patient Name: Alex Boyle Date of Service: 03/21/2021 1:00 PM Medical Record Number: [id number] Patient Account Number: [id number] Date of Birth/Sex: March 23, 1946 (75 y.o. M) Treating RN:  Donnamarie Poag Primary Care Provider: Abelino Derrick Other Clinician: Referring Provider: Abelino Derrick Treating Provider/Extender: Tito Dine in Treatment: 8 Notes Wound exam; just distal to the DIP. Considerable relative depth. I used a #15 scalpel to rough up the edges of this wound. Hemostasis with direct pressure. This does not probe to bone but its very close. Electronic Signature(s) Signed: 03/21/2021 3:54:48 PM By: Linton Ham MD Entered By: Linton Ham on 03/21/2021 13:24:22 Alex Boyle ([id number]) -------------------------------------------------------------------------------- Physician Orders Details Patient Name: Alex Boyle Date of Service: 03/21/2021 1:00 PM Medical Record Number: [id number] Patient Account Number: [id number] Date of Birth/Sex: 1945/10/29 (75 y.o. 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 Bathing/ Shower/ Hygiene o May shower; gently cleanse wound with antibacterial soap, rinse and pat dry prior to dressing wounds o No tub bath. Edema Control - Lymphedema / Segmental Compressive Device / Other o Elevate leg(s) parallel to the floor when sitting. o DO YOUR BEST to sleep in the bed at night. DO NOT sleep in your recliner. Long hours of sitting in a recliner leads to swelling of the legs and/or potential  wounds on your backside. Off-Loading Wound #1 Right Toe Second o Open toe surgical shoe o Other: - KEEP PRESSURE OFF OF FOOT/WOUND Medications-Please add to medication list. o P.O. Antibiotics - Extend doxycycline until finished as ordered Wound Treatment Wound #1 - Toe Second Wound Laterality: Right Cleanser: Normal Saline Every Other Day/30 Days Discharge Instructions: Wash your hands with soap and water. Remove old dressing, discard into plastic bag and place into trash. Cleanse the wound with Normal Saline prior to applying a clean dressing using gauze sponges, not tissues or cotton balls. Do not scrub or use excessive force. Pat dry using gauze sponges, not tissue or cotton balls. Primary Dressing: IODOFLEX 0.9% Cadexomer Iodine Pad Every Other Day/30 Days Discharge Instructions: Apply Iodoflex to wound bed only as directed. Secondary Dressing: Conforming Guaze Roll-Small (Generic) Every Other Day/30 Days Discharge Instructions: [place] as directed Secondary Dressing: Gauze Every Other Day/30 Days Secured With: 40M [place] Surgical Tape, 2x2 (in/yd) (Generic) Every Other Day/30 Days Discharge Instructions: Secure conform Electronic Signature(s) Signed: 03/21/2021 2:36:26 PM By: Donnamarie Poag Signed: 03/21/2021 3:54:48 PM By: Linton Ham MD Entered By: Donnamarie Poag on 03/21/2021 13:27:41 Alex Boyle ([id number]) -------------------------------------------------------------------------------- Problem List Details Patient Name: Alex Boyle Date of Service: 03/21/2021 1:00 PM Medical Record Number: [id number] Patient Account Number: [id number] Date of Birth/Sex: [birth year] (75 y.o. M) Treating RN: Donnamarie Poag Primary Care Provider: Abelino Derrick Other Clinician: Referring Provider: Abelino Derrick Treating Provider/Extender: Tito Dine in Treatment: 8 Active Problems ICD-10 Encounter Code Description Active  Date MDM Diagnosis T81.31XA Disruption of external operation (surgical) wound, not elsewhere 01/20/2021 No Yes classified, initial encounter L97.516 Non-pressure chronic ulcer of other part of right foot with bone 01/20/2021 No Yes involvement without evidence of necrosis I10 Essential (primary) hypertension 01/20/2021 No Yes I25.10 Atherosclerotic heart disease of native coronary artery without angina 01/20/2021 No Yes pectoris Inactive Problems Resolved Problems Electronic Signature(s) Signed: 03/21/2021 3:54:48 PM By: Linton Ham MD Entered By: Linton Ham on 03/21/2021 13:22:33 Alex Boyle ([id number]) -------------------------------------------------------------------------------- Progress Note Details Patient Name: Alex Boyle Date of Service: 03/21/2021 1:00 PM Medical Record Number: [id number] Patient Account Number: [id number] Date of Birth/Sex: 1945/06/14 (75 y.o. M) Treating RN: Donnamarie Poag Primary Care Provider: Abelino Derrick Other Clinician: Referring Provider: Abelino Derrick Treating Provider/Extender: Tito Dine in Treatment: 8 Subjective History of Present Illness (HPI) 01/20/2021 upon evaluation today patient appears to be doing somewhat poorly in regard to a surgery that he had that was actually on 06/10/2020. He tells me that the toe was subsequently a hammertoe and this needed to be corrected by surgical intervention. When they got and the joint was so deteriorated and ended up having to actually pin and fuse the toe. This no longer Britta Mccreedy has given him some trouble he tells me. With that being said he has been seeing [facility], Dr. Sherryle Lis, and the only thing that is really been mentioned is going to try to remove a portion of the toe in order to basically it sounds like refuse this. Nonetheless I really do not know that the patient is interested in going down that road at this point based on what I am hearing. He does have noted  culture on 12/22/2020 which was positive for Serratia. Nonetheless he has been on doxycycline though that just ended today. I would probably extend this for him due to the fact that I want him to be on this plenty in order to justify a good trial of antibiotics as well as wound care before potential for hyperbarics if he does indeed have osteomyelitis noted. The good news is his ABIs are on the right 1.38 with a TBI of 0.79 and on the left 1.21 with a TBI of 0.70. This was on 11/23/2020. Notably the patient is obviously very frustrated with the situation he has not had an MRI at this point since the surgery. I think that is really what we need to look into doing. Recently well8/22/2022 upon evaluation today patient appears to be doing regard to his toe ulcer. This is definitely not significantly improved compared to previous but is also not doing any worse to be honest. I did review his MRI today as well. With that being said I do not see any signs of definitive osteomyelitis currently. There was some mention of abnormal T1 and T2 signal intensity in the proximal and middle phalange ease of the second toe which could be due to recent surgery/stress reaction although osteomyelitis could not be excluded. Being that the bone at this location seems to be very solid there is no signs of flaking and in general the patient's toe does not appear to be showing signs of significant warmth with the erythema although there is some redness I think this is more just inflammatory as a result of the surgery. My gut feeling is that this does not represent osteomyelitis based on what I am seeing physically as well. The MRI also does not identify any signs of infection anywhere else in the foot  which is also good news. In general this is very reassuring in my opinion. All of this was discussed with the patient during the office visit today. 02/06/2021 upon evaluation today patient's toe actually appears to be doing  decently well today. There is still bone noted in the base of the toe but in general I think we are making some progress and this seems to be closing in quite nicely. With that being said he is going require little bit of debridement today. Also did discuss with him that we got the results back from the reaching out to the insurance company about the [place] unfortunately that was denied as a not a covered service. In the meantime I think continuing with the collagen is probably the best way to go. 02/14/2021 upon evaluation today patient appears to be doing well with regard to his toe ulcer. He has been tolerating the dressing changes without complication. Fortunately I do think that though he is doing better and this looks much better no longer erythematous the doxycycline in that regard. Nonetheless I do believe that he is still having some issues here to be honest with some necrotic debris buildup and we are getting need to clean this away. He is in agreement with that plan. 02/20/2021 upon evaluation today patient's toe actually again showed signs of the collagen being somewhat dried into the wound trapping fluid underneath. I think this is actually more detrimental than beneficial. There is some additional granulation today which is good news but in general I feel like the biggest issue is that we need to do what we can to try to prevent having ongoing and continued issues with this occurring. For that reason I think switching to [facility] Blue rope that we can put a small piece into the wound and then moisten would be the ideal thing to fill the space and prevent fluid trapping. 02/27/2021 upon evaluation today patient's toe ulcer actually showing some signs of improvement. This is very slow to progress but nonetheless I do feel like he is showing signs of greater improvement especially compared to where we started is also really not showing any signs of infection which is also great news. Overall I  am extremely pleased with where we stand today. 9/26; right second DIP. Initially a surgical wound to straighten a hammer deformity. Apparently this was done in December 2021. Still a small probing hole at 1 point this went down to bone. His wife reports she can no longer get Hydrofera Blue packing rope into the wound 10/11 right second DIP. Absolutely no improvement. This is a deep probing wound. It does not go to bone but there cannot be much tissue over this. This is just distal to the DIP itself. Last 2 weeks we have been using endoform Objective Constitutional Vitals Time Taken: 1:05 PM, Weight: 220 lbs, Temperature: 97.9 F, Pulse: 72 bpm, Respiratory Rate: 16 breaths/min, Blood Pressure: 145/79 mmHg. Integumentary (Hair, Skin) Wound #1 status is Open. Original cause of wound was Surgical Injury. The date acquired was: 06/11/2020. The wound has been in treatment [place], [place] ([id number]) weeks. The wound is located on the Right Toe Second. The wound measures 0.3cm length x 0.3cm width x 0.3cm depth; 0.071cm^2 area and 0.021cm^3 volume. There is tendon and Fat Layer (Subcutaneous Tissue) exposed. There is no tunneling or undermining noted. There is a medium amount of serosanguineous drainage noted. There is small (1-33%) red, pink granulation within the wound bed. There is a large (67-  100%) amount of necrotic tissue within the wound bed. 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 There was a Excisional Skin/Subcutaneous Tissue Debridement with a total area of 0.09 sq  cm performed by Ricard Dillon, MD. With the following instrument(s): scalpel to remove Viable and Non-Viable tissue/material. Material removed includes Subcutaneous Tissue after achieving pain control using Lidocaine. A time out was conducted at 15:17, prior to the start of the procedure. A Minimum amount of bleeding was controlled with Pressure. The procedure was tolerated well. Post Debridement Measurements: 0.4cm length x 0.3cm width x 0.3cm depth; 0.028cm^3 volume. Character of Wound/Ulcer Post Debridement is improved. Post procedure Diagnosis Wound #1: Same as Pre-Procedure Plan 1. I changed him to Iodoflex/Iodosorb which sometimes promotes adherence and small wounds in my experience 2. I wonder about the condition of the underlying joint although I have not x-rayed him. 3. Also note that the skin around this is very tight wonder whether this is what is changing this together. 4. He promises me he is just wearing socks at home never puts his shoe on and rubs on this area. 5. I see no evidence of Electronic Signature(s) Signed: 03/21/2021 3:54:48 PM By: Linton Ham MD Entered By: Linton Ham on 03/21/2021 13:25:47 Alex Boyle ([id number]) -------------------------------------------------------------------------------- SuperBill Details Patient Name: Alex Boyle Date of Service: 03/21/2021 Medical Record Number: [id number] Patient Account Number: [id number] Date of Birth/Sex: [birth year] (75 y.o. 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 I10 Essential (primary)  hypertension I25.10 Atherosclerotic heart disease of native coronary artery without angina pectoris Facility Procedures CPT4 Code Description: [id number] 11042 - DEB SUBQ TISSUE 20 SQ CM/< Modifier: Quantity: 1 CPT4 Code Description: ICD-10 Diagnosis Description 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

## 2021-03-21 NOTE — Progress Notes (Signed)
Alex Boyle ([id number]) Visit Report for 03/21/2021 Arrival Information Details Patient Name: Alex Boyle. Date of Service: 03/21/2021 1:00 PM Medical Record Number: [id number] Patient Account Number: [id number] Date of Birth/Sex: [birth year] (75 y.o. M) Treating RN: Donnamarie Poag Primary Care Keymarion Bearman: Abelino Derrick Other Clinician: Referring Allani Reber: Abelino Derrick Treating Addilynn Mowrer/Extender: Tito Dine in Treatment: 8 Visit Information History Since Last Visit Added or deleted any medications: No Patient Arrived: Ambulatory Had a fall or experienced change in No Arrival Time: 13:01 activities of daily living that may affect Accompanied By: self risk of falls: Transfer Assistance: None Hospitalized since last visit: No Patient Identification Verified: Yes Has Dressing in Place as Prescribed: Yes Secondary Verification Process Completed: Yes Pain Present Now: No Electronic Signature(s) Signed: 03/21/2021 2:36:26 PM By: Donnamarie Poag Entered By: Donnamarie Poag on 03/21/2021 13:04:46 Alex Boyle ([id number]) -------------------------------------------------------------------------------- Clinic Level of Care Assessment Details Patient Name: Alex Boyle Date of Service: 03/21/2021 1:00 PM Medical Record Number: [id number] Patient Account Number: [id number] Date of Birth/Sex: 04/21/1946 (75 y.o. M) Treating RN: Donnamarie Poag Primary Care Flower Franko: Abelino Derrick Other Clinician: Referring Bueford Arp: Abelino Derrick Treating Roby Donaway/Extender: Tito Dine in Treatment: 8 Clinic Level of Care Assessment Items TOOL 1 Quantity Score []  - Use when EandM and Procedure is performed on INITIAL visit 0 ASSESSMENTS - Nursing Assessment / Reassessment []  - General Physical Exam (combine w/ comprehensive assessment (listed just below) when performed on new 0 pt. evals) []  - 0 Comprehensive Assessment (HX, ROS, Risk Assessments, Wounds Hx,  etc.) ASSESSMENTS - Wound and Skin Assessment / Reassessment []  - Dermatologic / Skin Assessment (not related to wound area) 0 ASSESSMENTS - Ostomy and/or Continence Assessment and Care []  - Incontinence Assessment and Management 0 []  - 0 Ostomy Care Assessment and Management (repouching, etc.) PROCESS - Coordination of Care []  - Simple Patient / Family Education for ongoing care 0 []  - 0 Complex (extensive) Patient / Family Education for ongoing care []  - 0 Staff obtains Programmer, systems, Records, Test Results / Process Orders []  - 0 Staff telephones HHA, Nursing Homes / Clarify orders / etc []  - 0 Routine Transfer to another Facility (non-emergent condition) []  - 0 Routine Hospital Admission (non-emergent condition) []  - 0 New Admissions / Biomedical engineer / Ordering NPWT, Apligraf, etc. []  - 0 Emergency Hospital Admission (emergent condition) PROCESS - Special Needs []  - Pediatric / Minor Patient Management 0 []  - 0 Isolation Patient Management []  - 0 Hearing / Language / Visual special needs []  - 0 Assessment of Community assistance (transportation, D/C planning, etc.) []  - 0 Additional assistance / Altered mentation []  - 0 Support Surface(s) Assessment (bed, cushion, seat, etc.) INTERVENTIONS - Miscellaneous []  - External ear exam 0 []  - 0 Patient Transfer (multiple staff / Civil Service fast streamer / Similar devices) []  - 0 Simple Staple / Suture removal (25 or less) []  - 0 Complex Staple / Suture removal (26 or more) []  - 0 Hypo/Hyperglycemic Management (do not check if billed separately) []  - 0 Ankle / Brachial Index (ABI) - do not check if billed separately Has the patient been seen at the hospital within the last three years: Yes Total Score: 0 Level Of Care: ____ Alex Boyle ([id number]) Electronic Signature(s) Signed: 03/21/2021 2:36:26 PM By: Donnamarie Poag Entered By: Donnamarie Poag on 03/21/2021 13:26:57 Alex Boyle  ([id number]) -------------------------------------------------------------------------------- Encounter Discharge Information Details Patient Name: Alex Boyle Date of Service: 03/21/2021 1:00 PM Medical Record Number: [id number] Patient Account Number: [id number]  Date of Birth/Sex: 13-Aug-1945 (75 y.o. M) Treating RN: Donnamarie Poag Primary Care Olie Scaffidi: Abelino Derrick Other Clinician: Referring Kemontae Dunklee: Abelino Derrick Treating Migel Hannis/Extender: Tito Dine in Treatment: 8 Encounter Discharge Information Items Post Procedure Vitals Discharge Condition: Stable Temperature (F): 97.9 Ambulatory Status: Ambulatory Pulse (bpm): 72 Discharge Destination: Home Respiratory Rate (breaths/min): 16 Transportation: Private Auto Blood Pressure (mmHg): 145/79 Accompanied By: self Schedule Follow-up Appointment: Yes Clinical Summary of Care: Electronic Signature(s) Signed: 03/21/2021 2:36:26 PM By: Donnamarie Poag Entered By: Donnamarie Poag on 03/21/2021 13:28:33 Alex Boyle ([id number]) -------------------------------------------------------------------------------- Lower Extremity Assessment Details Patient Name: Alex Boyle Date of Service: 03/21/2021 1:00 PM Medical Record Number: [id number] Patient Account Number: [id number] Date of Birth/Sex: 10-09-1945 (75 y.o. M) Treating RN: Donnamarie Poag Primary Care Machaela Caterino: Abelino Derrick Other Clinician: Referring Merranda Bolls: Abelino Derrick Treating Deetra Booton/Extender: Ricard Dillon Weeks in Treatment: 8 Edema Assessment Assessed: [Left: No] [Right: Yes] [Left: Edema] [Right: :] Vascular Assessment Pulses: Dorsalis Pedis Palpable: [Right:Yes] Electronic Signature(s) Signed: 03/21/2021 2:36:26 PM By: Donnamarie Poag Entered By: Donnamarie Poag on 03/21/2021 13:11:25 Alex Boyle ([id number]) -------------------------------------------------------------------------------- Multi Wound Chart Details Patient Name: Alex Boyle Date of Service: 03/21/2021 1:00 PM Medical Record Number: [id number] Patient Account Number: [id number] Date of Birth/Sex: 1945-07-22 (75 y.o. M) Treating RN: Donnamarie Poag Primary Care Santhosh Gulino: Abelino Derrick Other Clinician: Referring Amanie Mcculley: Abelino Derrick Treating Ashantee Deupree/Extender: Tito Dine in Treatment: 8 Vital Signs Height(in): Pulse(bpm): 72 Weight(lbs): 220 Blood Pressure(mmHg): 145/79 Body Mass Index(BMI): Temperature(F): 97.9 Respiratory Rate(breaths/min): 16 Photos: [N/A:N/A] Wound Location: Right Toe Second N/A N/A Wounding Event: Surgical Injury N/A N/A Primary Etiology: Open Surgical Wound N/A N/A Comorbid History: Asthma, Sleep Apnea, N/A N/A Hypertension, Myocardial Infarction, Neuropathy Date Acquired: 06/11/2020 N/A N/A Weeks of Treatment: 8 N/A N/A Wound Status: Open N/A N/A Pending Amputation on Yes N/A N/A Presentation: Measurements L x W x D (cm) 0.3x0.3x0.3 N/A N/A Area (cm) : 0.071 N/A N/A Volume (cm) : 0.021 N/A N/A % Reduction in Area: 63.80% N/A N/A % Reduction in Volume: 73.40% N/A N/A Classification: Full Thickness With Exposed N/A N/A Support Structures Exudate Amount: Medium N/A N/A Exudate Type: Serosanguineous N/A N/A Exudate Color: red, brown N/A N/A Granulation Amount: Small (1-33%) N/A N/A Granulation Quality: Red, Pink N/A N/A Necrotic Amount: Large (67-100%) N/A N/A Exposed Structures: Fat Layer (Subcutaneous Tissue): N/A N/A Yes Tendon: Yes Fascia: No Muscle: No Joint: No Bone: No Epithelialization: Small (1-33%) N/A N/A Debridement: Debridement - Excisional N/A N/A Pre-procedure Verification/Time 15:17 N/A N/A Out Taken: Pain Control: Lidocaine N/A N/A Tissue Debrided: Subcutaneous N/A N/A Level: Skin/Subcutaneous Tissue N/A N/A Debridement Area (sq cm): 0.09 N/A N/A Instrument: Other(scalpel) N/A N/A Bleeding: Minimum N/A N/A Hemostasis Achieved: Pressure N/A N/A Procedure was tolerated well  N/A N/A Alex Boyle ([id number]) Debridement Treatment Response: Post Debridement 0.4x0.3x0.3 N/A N/A Measurements L x W x D (cm) Post Debridement Volume: 0.028 N/A N/A (cm) Procedures Performed: Debridement N/A N/A Treatment Notes Electronic Signature(s) Signed: 03/21/2021 3:54:48 PM By: Linton Ham MD Entered By: Linton Ham on 03/21/2021 13:22:40 Alex Boyle ([id number]) -------------------------------------------------------------------------------- [place] Details Patient Name: Alex Boyle Date of Service: 03/21/2021 1:00 PM Medical Record Number: [id number] Patient Account Number: [id number] Date of Birth/Sex: 1946-02-25 (75 y.o. M) Treating RN: Donnamarie Poag Primary Care Haytham Maher: Abelino Derrick Other Clinician: Referring Hines Kloss: Abelino Derrick Treating Chan Sheahan/Extender: Tito Dine in Treatment: 8 Active Inactive Wound/Skin Impairment Nursing Diagnoses: Impaired tissue integrity Goals: Patient/caregiver will verbalize understanding of  skin care regimen Date Initiated: 01/20/2021 Date Inactivated: 02/14/2021 Target Resolution Date: 01/20/2021 Goal Status: Met Ulcer/skin breakdown will have a volume reduction of 30% by week 4 Date Initiated: 01/20/2021 Target Resolution Date: 02/20/2021 Goal Status: Active Ulcer/skin breakdown will have a volume reduction of 50% by week 8 Date Initiated: 01/20/2021 Target Resolution Date: 03/22/2021 Goal Status: Active Ulcer/skin breakdown will have a volume reduction of 80% by week 12 Date Initiated: 01/20/2021 Target Resolution Date: 04/22/2021 Goal Status: Active Ulcer/skin breakdown will heal within 14 weeks Date Initiated: 01/20/2021 Target Resolution Date: 05/22/2021 Goal Status: Active Interventions: Assess patient/caregiver ability to obtain necessary supplies Assess patient/caregiver ability to perform ulcer/skin care regimen upon admission and as needed Assess  ulceration(s) every visit Provide education on ulcer and skin care Treatment Activities: Skin care regimen initiated : 01/20/2021 Notes: Electronic Signature(s) Signed: 03/21/2021 2:36:26 PM By: Donnamarie Poag Entered By: Donnamarie Poag on 03/21/2021 13:11:43 Alex Boyle ([id number]) -------------------------------------------------------------------------------- Pain Assessment Details Patient Name: Alex Boyle Date of Service: 03/21/2021 1:00 PM Medical Record Number: [id number] Patient Account Number: [id number] Date of Birth/Sex: Feb 10, 1946 (75 y.o. M) Treating RN: Donnamarie Poag Primary Care Davine Sweney: Abelino Derrick Other Clinician: Referring Perri Aragones: Abelino Derrick Treating Kortny Lirette/Extender: Tito Dine in Treatment: 8 Active Problems Location of Pain Severity and Description of Pain Patient Has Paino No Site Locations Rate the pain. Current Pain Level: 0 Pain Management and Medication Current Pain Management: Electronic Signature(s) Signed: 03/21/2021 2:36:26 PM By: Donnamarie Poag Entered By: Donnamarie Poag on 03/21/2021 13:09:24 Alex Boyle ([id number]) -------------------------------------------------------------------------------- Patient/Caregiver Education Details Patient Name: Alex Boyle Date of Service: 03/21/2021 1:00 PM Medical Record Number: [id number] Patient Account Number: [id number] Date of Birth/Gender: 18-Jan-1946 (75 y.o. M) Treating RN: Donnamarie Poag Primary Care Physician: Abelino Derrick Other Clinician: Referring Physician: Abelino Derrick Treating Physician/Extender: Tito Dine in Treatment: 8 Education Assessment Education Provided To: Patient Education Topics Provided Basic Hygiene: Offloading: Wound/Skin Impairment: Electronic Signature(s) Signed: 03/21/2021 2:36:26 PM By: Donnamarie Poag Entered By: Donnamarie Poag on 03/21/2021 13:27:14 Alex Boyle  ([id number]) -------------------------------------------------------------------------------- Wound Assessment Details Patient Name: Alex Boyle Date of Service: 03/21/2021 1:00 PM Medical Record Number: [id number] Patient Account Number: [id number] Date of Birth/Sex: Jul 07, 1945 (75 y.o. M) Treating RN: Donnamarie Poag Primary Care Armida Vickroy: Abelino Derrick Other Clinician: Referring Atharva Mirsky: Abelino Derrick Treating Garnetta Fedrick/Extender: Tito Dine in Treatment: 8 Wound Status Wound Number: 1 Primary Open Surgical Wound Etiology: Wound Location: Right Toe Second Wound Status: Open Wounding Event: Surgical Injury Comorbid Asthma, Sleep Apnea, Hypertension, Myocardial Date Acquired: 06/11/2020 History: Infarction, Neuropathy Weeks Of Treatment: 8 Clustered Wound: No Pending Amputation On Presentation Photos Wound Measurements Length: (cm) 0.3 Width: (cm) 0.3 Depth: (cm) 0.3 Area: (cm) 0.071 Volume: (cm) 0.021 % Reduction in Area: 63.8% % Reduction in Volume: 73.4% Epithelialization: Small (1-33%) Tunneling: No Undermining: No Wound Description Classification: Full Thickness With Exposed Support Structures Exudate Amount: Medium Exudate Type: Serosanguineous Exudate Color: red, brown Foul Odor After Cleansing: No Slough/Fibrino Yes Wound Bed Granulation Amount: Small (1-33%) Exposed Structure Granulation Quality: Red, Pink Fascia Exposed: No Necrotic Amount: Large (67-100%) Fat Layer (Subcutaneous Tissue) Exposed: Yes Tendon Exposed: Yes Muscle Exposed: No Joint Exposed: No Bone Exposed: No Treatment Notes Wound #1 (Toe Second) Wound Laterality: Right Cleanser Normal Saline Discharge Instruction: Wash your hands with soap and water. Remove old dressing, discard into plastic bag and place into trash. Cleanse the wound with Normal Saline prior to applying a clean dressing using gauze sponges, not tissues  or cotton balls. Do not scrub or use excessive  force. Pat dry using gauze sponges, not tissue or cotton balls. Alex Boyle, Alex Boyle ([id number]) Peri-Wound Care Topical Primary Dressing IODOFLEX 0.9% Cadexomer Iodine Pad Discharge Instruction: Apply Iodoflex to wound bed only as directed. Secondary Dressing [place] Roll-Small Discharge Instruction: Apply Conforming Stretch Guaze Bandage as directed Gauze Secured With 45M Medipore H Soft Cloth Surgical Tape, 2x2 (in/yd) Discharge Instruction: Secure conform Compression Wrap Compression Stockings Add-Ons Electronic Signature(s) Signed: 03/21/2021 2:36:26 PM By: Donnamarie Poag Entered By: Donnamarie Poag on 03/21/2021 13:17:44 Alex Boyle ([id number]) -------------------------------------------------------------------------------- Vitals Details Patient Name: Alex Boyle Date of Service: 03/21/2021 1:00 PM Medical Record Number: [id number] Patient Account Number: [id number] Date of Birth/Sex: Aug 29, 1945 (75 y.o. M) Treating RN: Donnamarie Poag Primary Care Sophya Vanblarcom: Abelino Derrick Other Clinician: Referring Sanya Kobrin: Abelino Derrick Treating Cathan Gearin/Extender: Tito Dine in Treatment: 8 Vital Signs Time Taken: 13:05 Temperature (F): 97.9 Weight (lbs): 220 Pulse (bpm): 72 Respiratory Rate (breaths/min): 16 Blood Pressure (mmHg): 145/79 Reference Range: 80 - 120 mg / dl Electronic Signature(s) Signed: 03/21/2021 2:36:26 PM By: Donnamarie Poag Entered ByDonnamarie Poag on 03/21/2021 13:07:47

## 2021-03-23 ENCOUNTER — Ambulatory Visit: Payer: Medicare Other | Admitting: Physician Assistant

## 2021-03-24 DIAGNOSIS — Z87442 Personal history of urinary calculi: Secondary | ICD-10-CM | POA: Diagnosis not present

## 2021-03-24 DIAGNOSIS — R3915 Urgency of urination: Secondary | ICD-10-CM | POA: Diagnosis not present

## 2021-03-24 DIAGNOSIS — R3912 Poor urinary stream: Secondary | ICD-10-CM | POA: Diagnosis not present

## 2021-03-24 DIAGNOSIS — N401 Enlarged prostate with lower urinary tract symptoms: Secondary | ICD-10-CM | POA: Diagnosis not present

## 2021-03-28 ENCOUNTER — Other Ambulatory Visit: Payer: Self-pay

## 2021-03-28 ENCOUNTER — Encounter: Payer: Federal, State, Local not specified - PPO | Admitting: Physician Assistant

## 2021-03-28 DIAGNOSIS — T8131XA Disruption of external operation (surgical) wound, not elsewhere classified, initial encounter: Secondary | ICD-10-CM | POA: Diagnosis not present

## 2021-03-28 DIAGNOSIS — L97516 Non-pressure chronic ulcer of other part of right foot with bone involvement without evidence of necrosis: Secondary | ICD-10-CM | POA: Diagnosis not present

## 2021-03-28 DIAGNOSIS — I2510 Atherosclerotic heart disease of native coronary artery without angina pectoris: Secondary | ICD-10-CM | POA: Diagnosis not present

## 2021-03-28 DIAGNOSIS — I10 Essential (primary) hypertension: Secondary | ICD-10-CM | POA: Diagnosis not present

## 2021-03-28 NOTE — Progress Notes (Addendum)
[name], [name] ([id number]) Visit Report for 03/28/2021 Chief Complaint Document Details Patient Name: Alex Boyle, Alex Boyle. Date of Service: 03/28/2021 3:00 PM Medical Record Number: [id number] Patient Account Number: [id number] Date of Birth/Sex: Sep 13, 1945 (75 y.o. Male) Treating RN: Alex Boyle Primary Care Provider: Abelino Boyle Other Clinician: Referring Provider: Abelino Boyle Treating Provider/Extender: Skipper Cliche in Treatment: 9 Information Obtained from: Patient Chief Complaint Right 2nd Toe Ulcer Electronic Signature(s) Signed: 03/28/2021 3:20:53 PM By: Worthy Keeler PA-C Entered By: Worthy Keeler on 03/28/2021 15:20:53 Alex Boyle ([id number]) -------------------------------------------------------------------------------- Debridement Details Patient Name: Alex Boyle Date of Service: 03/28/2021 3:00 PM Medical Record Number: [id number] Patient Account Number: [id number] Date of Birth/Sex: 06-May-1946 (75 y.o. Male) Treating RN: Alex Boyle Primary Care Provider: Abelino Boyle Other Clinician: Referring Provider: Abelino Boyle Treating Provider/Extender: Skipper Cliche in Treatment: 9 Debridement Performed for Wound #1 Right Toe Second Assessment: Performed By: Physician Tommie Sams., PA-C Debridement Type: Debridement Level of Consciousness (Pre- Awake and Alert procedure): Pre-procedure Verification/Time Out Yes - 15:15 Taken: Start Time: 15:15 Total Area Debrided (L x W): 0.4 (cm) x 0.4 (cm) = 0.16 (cm) Tissue and other material Non-Viable, Eschar, Slough, Subcutaneous, Skin: Dermis , Skin: Epidermis, Slough debrided: Level: Skin/Subcutaneous Tissue Debridement Description: Excisional Instrument: Curette Bleeding: Minimum Hemostasis Achieved: Pressure End Time: 15:33 Procedural Pain: 0 Post Procedural Pain: 0 Response to Treatment: Procedure was tolerated well Level of Consciousness (Post- Awake and  Alert procedure): Post Debridement Measurements of Total Wound Length: (cm) 0.3 Width: (cm) 0.3 Depth: (cm) 0.2 Volume: (cm) 0.014 Character of Wound/Ulcer Post Debridement: Improved Post Procedure Diagnosis Same as Pre-procedure Electronic Signature(s) Signed: 03/28/2021 4:43:23 PM By: Worthy Keeler PA-C Signed: 03/31/2021 4:19:44 PM By: Alex Coria RN Entered By: Alex Boyle on 03/28/2021 15:33:39 Alex Boyle ([id number]) -------------------------------------------------------------------------------- HPI Details Patient Name: Alex Boyle Date of Service: 03/28/2021 3:00 PM Medical Record Number: [id number] Patient Account Number: [id number] Date of Birth/Sex: Jun 09, 1946 (75 y.o. Male) Treating RN: Alex Boyle Primary Care Provider: Abelino Boyle Other Clinician: Referring Provider: Abelino Boyle Treating Provider/Extender: Skipper Cliche in Treatment: 9 History of Present Illness HPI Description: 01/20/2021 upon evaluation today patient appears to be doing somewhat poorly in regard to a surgery that he had that was actually on 06/10/2020. He tells me that the toe was subsequently a hammertoe and this needed to be corrected by surgical intervention. When they got and the joint was so deteriorated and ended up having to actually pin and fuse the toe. This no longer Britta Mccreedy has given him some trouble he tells me. With that being said he has been seeing [facility], Dr. Sherryle Lis, and the only thing that is really been mentioned is going to try to remove a portion of the toe in order to basically it sounds like refuse this. Nonetheless I really do not know that the patient is interested in going down that road at this point based on what I am hearing. He does have noted culture on 12/22/2020 which was positive for Serratia. Nonetheless he has been on doxycycline though that just ended today. I would probably extend this for him due to the fact that I want him to  be on this plenty in order to justify a good trial of antibiotics as well as wound care before potential for hyperbarics if he does indeed have osteomyelitis noted. The good news is his ABIs are on the right 1.38 with a TBI of 0.79 and on the left  1.21 with a TBI of 0.70. This was on 11/23/2020. Notably the patient is obviously very frustrated with the situation he has not had an MRI at this point since the surgery. I think that is really what we need to look into doing. Recently well8/22/2022 upon evaluation today patient appears to be doing regard to his toe ulcer. This is definitely not significantly improved compared to previous but is also not doing any worse to be honest. I did review his MRI today as well. With that being said I do not see any signs of definitive osteomyelitis currently. There was some mention of abnormal T1 and T2 signal intensity in the proximal and middle phalange ease of the second toe which could be due to recent surgery/stress reaction although osteomyelitis could not be excluded. Being that the bone at this location seems to be very solid there is no signs of flaking and in general the patient's toe does not appear to be showing signs of significant warmth with the erythema although there is some redness I think this is more just inflammatory as a result of the surgery. My gut feeling is that this does not represent osteomyelitis based on what I am seeing physically as well. The MRI also does not identify any signs of infection anywhere else in the foot which is also good news. In general this is very reassuring in my opinion. All of this was discussed with the patient during the office visit today. 02/06/2021 upon evaluation today patient's toe actually appears to be doing decently well today. There is still bone noted in the base of the toe but in general I think we are making some progress and this seems to be closing in quite nicely. With that being said he is going  require little bit of debridement today. Also did discuss with him that we got the results back from the reaching out to the insurance company about the [place] unfortunately that was denied as a not a covered service. In the meantime I think continuing with the collagen is probably the best way to go. 02/14/2021 upon evaluation today patient appears to be doing well with regard to his toe ulcer. He has been tolerating the dressing changes without complication. Fortunately I do think that though he is doing better and this looks much better no longer erythematous the doxycycline in that regard. Nonetheless I do believe that he is still having some issues here to be honest with some necrotic debris buildup and we are getting need to clean this away. He is in agreement with that plan. 02/20/2021 upon evaluation today patient's toe actually again showed signs of the collagen being somewhat dried into the wound trapping fluid underneath. I think this is actually more detrimental than beneficial. There is some additional granulation today which is good news but in general I feel like the biggest issue is that we need to do what we can to try to prevent having ongoing and continued issues with this occurring. For that reason I think switching to [facility] Blue rope that we can put a small piece into the wound and then moisten would be the ideal thing to fill the space and prevent fluid trapping. 02/27/2021 upon evaluation today patient's toe ulcer actually showing some signs of improvement. This is very slow to progress but nonetheless I do feel like he is showing signs of greater improvement especially compared to where we started is also really not showing any signs of infection which is also great  news. Overall I am extremely pleased with where we stand today. 9/26; right second DIP. Initially a surgical wound to straighten a hammer deformity. Apparently this was done in December 2021. Still a small probing  hole at 1 point this went down to bone. His wife reports she can no longer get Hydrofera Blue packing rope into the wound 10/11 right second DIP. Absolutely no improvement. This is a deep probing wound. It does not go to bone but there cannot be much tissue over this. This is just distal to the DIP itself. Last 2 weeks we have been using endoform 03/28/2021 upon evaluation today patient appears to be doing well with regard to his toe ulcer. The only thing is that the Iodoflex keeps drying up in the hole and then trapping fluid underneath as what was seen today. Nonetheless I do believe we did clean this out and then subsequently it may just be good for [place] to use like a Vaseline gauze such as Xeroform gauze to treat the area this should keep it moist without drying out and hopefully allow this to heal from the inside out the last little bit this wound is significantly better than what it was in the past. Electronic Signature(s) Signed: 03/28/2021 3:39:42 PM By: Worthy Keeler PA-C Entered By: Worthy Keeler on 03/28/2021 15:39:42 Alex Boyle ([id number]) -------------------------------------------------------------------------------- Physical Exam Details Patient Name: Alex Boyle Date of Service: 03/28/2021 3:00 PM Medical Record Number: [id number] Patient Account Number: [id number] Date of Birth/Sex: 1946/04/21 (75 y.o. Male) Treating RN: Alex Boyle Primary Care Provider: Abelino Boyle Other Clinician: Referring Provider: Abelino Boyle Treating Provider/Extender: Skipper Cliche in Treatment: 71 Constitutional Well-nourished and well-hydrated in no acute distress. Respiratory normal breathing without difficulty. Psychiatric this patient is able to make decisions and demonstrates good insight into disease process. Alert and Oriented x 3. pleasant and cooperative. Notes Upon inspection patient's wound bed showed signs of good granulation epithelization at this point.  Fortunately there does not appear to be any signs of infection which is great and overall I am extremely pleased with where we stand today. No fevers, chills, nausea, vomiting, or diarrhea. Electronic Signature(s) Signed: 03/28/2021 3:39:59 PM By: Worthy Keeler PA-C Entered By: Worthy Keeler on 03/28/2021 15:39:58 Alex Boyle ([id number]) -------------------------------------------------------------------------------- Physician Orders Details Patient Name: Alex Boyle Date of Service: 03/28/2021 3:00 PM Medical Record Number: [id number] Patient Account Number: [id number] Date of Birth/Sex: 06/03/1946 (75 y.o. 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 Bathing/ Shower/ Hygiene o May shower; gently cleanse wound with antibacterial soap, rinse and pat dry prior to dressing wounds o No tub bath. Edema Control - Lymphedema / Segmental Compressive Device / Other o Elevate leg(s) parallel to the floor when sitting. o DO YOUR BEST to sleep in the bed at night. DO NOT sleep in your recliner. Long hours of sitting in a recliner leads to swelling of the legs and/or potential wounds on your backside. Off-Loading Wound #1 Right Toe Second o Open toe surgical shoe o Other: -  KEEP PRESSURE OFF OF FOOT/WOUND Wound Treatment Wound #1 - Toe Second Wound Laterality:  Right Cleanser: Normal Saline 1 x Per Day/30 Days Discharge Instructions: Wash your hands with soap and water. Remove old dressing, discard into plastic bag and place into trash. Cleanse the wound with Normal Saline prior to applying a clean dressing using gauze sponges, not tissues or cotton balls. Do not scrub or use excessive force. Pat dry using gauze sponges, not tissue or cotton balls. Primary Dressing: Xeroform 4x4-HBD (in/in) (Generic) 1 x Per Day/30 Days Discharge Instructions: Apply Xeroform 4x4-HBD (in/in) as directed Secondary Dressing: Conforming Guaze Roll-Small (Generic) 1 x Per Day/30 Days Discharge Instructions: [place] as directed Secondary Dressing: Gauze 1 x Per Day/30 Days Secured With: 48M Medipore H Soft Cloth Surgical Tape, 2x2 (in/yd) (Generic) 1 x Per Day/30 Days Discharge Instructions: Secure conform Electronic Signature(s) Signed: 03/28/2021 4:43:23 PM By: Worthy Keeler PA-C Signed: 03/31/2021 4:19:44 PM By: Alex Coria RN Entered By: Alex Boyle on 03/28/2021 15:36:38 Alex Boyle ([id number]) -------------------------------------------------------------------------------- Problem List Details Patient Name: Alex Boyle Date of Service: 03/28/2021 3:00 PM Medical Record Number: [id number] Patient Account Number: [id number] Date of Birth/Sex: 10/06/1945 (75 y.o. 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 Male) Treating RN: Alex Boyle Primary Care Provider: Abelino Boyle Other Clinician: Referring Provider: Abelino Boyle Treating Provider/Extender: Skipper Cliche in Treatment: 9 Subjective Chief Complaint Information obtained from Patient Right 2nd Toe Ulcer History of Present Illness (HPI) 01/20/2021 upon evaluation today patient appears to be doing somewhat poorly in regard to a surgery that he had that was actually on 06/10/2020. He tells me that the toe was subsequently a hammertoe and this needed to be corrected by surgical intervention. When they got and the joint was so deteriorated and ended up having to actually pin and fuse the toe. This no longer Britta Mccreedy has given him some trouble he tells me. With that being said he has been seeing [facility], Dr. Sherryle Lis, and the only thing that is really been mentioned is going to try to remove a portion of the toe in order to basically it sounds like refuse this. Nonetheless I really do not know that the patient is interested in going down that road at this point based on what I am hearing. He does have noted culture on 12/22/2020 which was positive for Serratia. Nonetheless he has been on doxycycline though that just ended today. I would probably extend this for him due to the fact that I want him to be on this plenty in order to justify a good trial of  antibiotics as well as wound care before potential for hyperbarics if  he does indeed have osteomyelitis noted. The good news is his ABIs are on the right 1.38 with a TBI of 0.79 and on the left 1.21 with a TBI of 0.70. This was on 11/23/2020. Notably the patient is obviously very frustrated with the situation he has not had an MRI at this point since the surgery. I think that is really what we need to look into doing. Recently well8/22/2022 upon evaluation today patient appears to be doing regard to his toe ulcer. This is definitely not significantly improved compared to previous but is also not doing any worse to be honest. I did review his MRI today as well. With that being said I do not see any signs of definitive osteomyelitis currently. There was some mention of abnormal T1 and T2 signal intensity in the proximal and middle phalange ease of the second toe which could be due to recent surgery/stress reaction although osteomyelitis could not be excluded. Being that the bone at this location seems to be very solid there is no signs of flaking and in general the patient's toe does not appear to be showing signs of significant warmth with the erythema although there is some redness I think this is more just inflammatory as a result of the surgery. My gut feeling is that this does not represent osteomyelitis based on what I am seeing physically as well. The MRI also does not identify any signs of infection anywhere else in the foot which is also good news. In general this is very reassuring in my opinion. All of this was discussed with the patient during the office visit today. 02/06/2021 upon evaluation today patient's toe actually appears to be doing decently well today. There is still bone noted in the base of the toe but in general I think we are making some progress and this seems to be closing in quite nicely. With that being said he is going require little bit of debridement today. Also did  discuss with him that we got the results back from the reaching out to the insurance company about the [place] unfortunately that was denied as a not a covered service. In the meantime I think continuing with the collagen is probably the best way to go. 02/14/2021 upon evaluation today patient appears to be doing well with regard to his toe ulcer. He has been tolerating the dressing changes without complication. Fortunately I do think that though he is doing better and this looks much better no longer erythematous the doxycycline in that regard. Nonetheless I do believe that he is still having some issues here to be honest with some necrotic debris buildup and we are getting need to clean this away. He is in agreement with that plan. 02/20/2021 upon evaluation today patient's toe actually again showed signs of the collagen being somewhat dried into the wound trapping fluid underneath. I think this is actually more detrimental than beneficial. There is some additional granulation today which is good news but in general I feel like the biggest issue is that we need to do what we can to try to prevent having ongoing and continued issues with this occurring. For that reason I think switching to [facility] Blue rope that we can put a small piece into the wound and then moisten would be the ideal thing to fill the space and prevent fluid trapping. 02/27/2021 upon evaluation today patient's toe ulcer actually showing some signs of improvement. This is very slow to progress but nonetheless I do feel like  he is showing signs of greater improvement especially compared to where we started is also really not showing any signs of infection which is also great news. Overall I am extremely pleased with where we stand today. 9/26; right second DIP. Initially a surgical wound to straighten a hammer deformity. Apparently this was done in December 2021. Still a small probing hole at 1 point this went down to bone. His wife  reports she can no longer get Hydrofera Blue packing rope into the wound 10/11 right second DIP. Absolutely no improvement. This is a deep probing wound. It does not go to bone but there cannot be much tissue over this. This is just distal to the DIP itself. Last 2 weeks we have been using endoform 03/28/2021 upon evaluation today patient appears to be doing well with regard to his toe ulcer. The only thing is that the Iodoflex keeps drying up in the hole and then trapping fluid underneath as what was seen today. Nonetheless I do believe we did clean this out and then subsequently it may just be good for [place] to use like a Vaseline gauze such as Xeroform gauze to treat the area this should keep it moist without drying out and hopefully allow this to heal from the inside out the last little bit this wound is significantly better than what it was in the past. Alex Boyle, [place]. ([id number]) Objective Constitutional Well-nourished and well-hydrated in no acute distress. Vitals Time Taken: 3:22 PM, Weight: 220 lbs, Temperature: 98.1 F, Pulse: 69 bpm, Respiratory Rate: 18 breaths/min, Blood Pressure: 106/68 mmHg. Respiratory normal breathing without difficulty. Psychiatric this patient is able to make decisions and demonstrates good insight into disease process. Alert and Oriented x 3. pleasant and cooperative. General Notes: Upon inspection patient's wound bed showed signs of good granulation epithelization at this point. Fortunately there does not appear to be any signs of infection which is great and overall I am extremely pleased with where we stand today. No fevers, chills, nausea, vomiting, or diarrhea. Integumentary (Hair, Skin) Wound #1 status is Open. Original cause of wound was Surgical Injury. The date acquired was: 06/11/2020. The wound has been in treatment 9 weeks. The wound is located on the Right Toe Second. The wound measures 0.1cm length x 0.1cm width x 0.1cm depth; 0.008cm^2 area  and 0.001cm^3 volume. There is tendon and Fat Layer (Subcutaneous Tissue) exposed. There is no tunneling or undermining noted. There is a medium amount of serosanguineous drainage noted. There is small (1-33%) red, pink granulation within the wound bed. There is a large (67- 100%) amount of necrotic tissue within the wound bed. 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 There was a Excisional Skin/Subcutaneous Tissue Debridement with a total area of 0.16 sq cm performed by Tommie Sams., PA-C. With the following instrument(s): Curette to remove Non- Viable tissue/material. Material removed includes Eschar, Subcutaneous Tissue, Slough, Skin: Dermis, and Skin: Epidermis. No specimens were taken. A time out was conducted at 15:15, prior to the start of the procedure. A Minimum amount of bleeding was controlled with Pressure. The procedure was tolerated well with a pain level of 0 throughout and a pain level  of 0 following the procedure. Post Debridement Measurements: 0.3cm length x 0.3cm width x 0.2cm depth; 0.014cm^3 volume. Character of Wound/Ulcer Post Debridement is improved. Post procedure Diagnosis Wound #1: Same as Pre-Procedure Plan Follow-up Appointments: Return Appointment in 1 week. Bathing/ Shower/ Hygiene: May shower; gently cleanse wound with antibacterial soap, rinse and pat dry prior to dressing wounds No tub bath. Edema Control - Lymphedema / Segmental Compressive Device / Other: Elevate leg(s) parallel to the floor when sitting. DO YOUR BEST to sleep in the  bed at night. DO NOT sleep in your recliner. Long hours of sitting in a recliner leads to swelling of the legs and/or potential wounds on your backside. Off-Loading: Alex Boyle, Alex Boyle ([id number]) Wound #1 Right Toe Second: Open toe surgical shoe Other: - KEEP PRESSURE OFF OF FOOT/WOUND WOUND #1: - Toe Second Wound Laterality: Right Cleanser: Normal Saline 1 x Per Day/30 Days Discharge Instructions: Wash your hands with soap and water. Remove old dressing, discard into plastic bag and place into trash. Cleanse the wound with Normal Saline prior to applying a clean dressing using gauze sponges, not tissues or cotton balls. Do not scrub or use excessive force. Pat dry using gauze sponges, not tissue or cotton balls. Primary Dressing: Xeroform 4x4-HBD (in/in) (Generic) 1 x Per Day/30 Days Discharge Instructions: Apply Xeroform 4x4-HBD (in/in) as directed Secondary Dressing: Conforming Gu[place] Roll-Small (Generic) 1 x Per Day/30 Days Discharge Instructions: Apply Conforming Stretch Guaze Bandage as directed Secondary Dressing: Gauze 1 x Per Day/30 Days Secured With: 70M Medipore H Soft Cloth Surgical Tape, 2x2 (in/yd) (Generic) 1 x Per Day/30 Days Discharge Instructions: Secure conform 1. Would recommend that we going continue with wound care measures as before and the patient is in agreement with plan. Fortunately I think we are headed in the right direction and then a switch to Xeroform gauze dressing 2. I am also can recommend that we have the patient continue to wrap this with roll gauze to secure in place. [place] suggest as well that we continue to monitor for any signs of worsening or infection hopefully that will not be an issue. We will see patient back for reevaluation in 1 week here in the clinic. If anything worsens or changes patient will contact our office for additional recommendations. Electronic Signature(s) Signed: 03/28/2021 3:46:54 PM By: Worthy Keeler PA-C Entered By: Worthy Keeler on 03/28/2021 15:46:54 Alex Boyle ([id number]) -------------------------------------------------------------------------------- SuperBill Details Patient Name: Alex Boyle Date of Service: 03/28/2021 Medical Record Number: [id number] Patient Account Number: [id number] Date of Birth/Sex: 12/27/1945 (75 y.o. 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 I10 Essential (primary) hypertension I25.10 Atherosclerotic heart disease of native coronary artery without angina pectoris Facility Procedures CPT4 Code Description: 77824235 11042 - DEB SUBQ TISSUE 20 SQ CM/< Modifier: Quantity: 1 CPT4 Code Description: ICD-10 Diagnosis Description 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

## 2021-03-28 NOTE — Progress Notes (Addendum)
REYNOL, ARNONE ([id number]) Visit Report for 03/28/2021 Arrival Information Details Patient Name: Alex Boyle, Alex Boyle. Date of Service: 03/28/2021 3:00 PM Medical Record Number: [id number] Patient Account Number: [id number] Date of Birth/Sex: 1946-05-06 (75 y.o. Male) Treating RN: Carlene Coria Primary Care Lamya Lausch: Abelino Derrick Other Clinician: Referring Neeya Prigmore: Abelino Derrick Treating Raeya Merritts/Extender: Skipper Cliche in Treatment: 9 Visit Information History Since Last Visit All ordered tests and consults were completed: No Patient Arrived: Ambulatory Added or deleted any medications: No Arrival Time: 15:19 Any new allergies or adverse reactions: No Accompanied By: self Had a fall or experienced change in No Transfer Assistance: None activities of daily living that may affect Patient Identification Verified: Yes risk of falls: Secondary Verification Process Completed: Yes Signs or symptoms of abuse/neglect since last visito No Patient Requires Transmission-Based Precautions: No Hospitalized since last visit: No Patient Has Alerts: No Implantable device outside of the clinic excluding No cellular tissue based products placed in the center since last visit: Has Dressing in Place as Prescribed: Yes Pain Present Now: No Electronic Signature(s) Signed: 03/31/2021 4:19:44 PM By: Carlene Coria RN Entered By: Carlene Coria on 03/28/2021 15:22:43 Alex Boyle ([id number]) -------------------------------------------------------------------------------- Clinic Level of Care Assessment Details Patient Name: Alex Boyle Date of Service: 03/28/2021 3:00 PM Medical Record Number: [id number] Patient Account Number: [id number] Date of Birth/Sex: [birth year] (75 y.o. Male) Treating RN: Carlene Coria Primary Care Brixton Franko: Abelino Derrick Other Clinician: Referring Rito Lecomte: Abelino Derrick Treating Rahaf Carbonell/Extender: Skipper Cliche in Treatment: 9 Clinic Level of Care  Assessment Items TOOL 1 Quantity Score []  - Use when EandM and Procedure is performed on INITIAL visit 0 ASSESSMENTS - Nursing Assessment / Reassessment []  - General Physical Exam (combine w/ comprehensive assessment (listed just below) when performed on new 0 pt. evals) []  - 0 Comprehensive Assessment (HX, ROS, Risk Assessments, Wounds Hx, etc.) ASSESSMENTS - Wound and Skin Assessment / Reassessment []  - Dermatologic / Skin Assessment (not related to wound area) 0 ASSESSMENTS - Ostomy and/or Continence Assessment and Care []  - Incontinence Assessment and Management 0 []  - 0 Ostomy Care Assessment and Management (repouching, etc.) PROCESS - Coordination of Care []  - Simple Patient / Family Education for ongoing care 0 []  - 0 Complex (extensive) Patient / Family Education for ongoing care []  - 0 Staff obtains Programmer, systems, Records, Test Results / Process Orders []  - 0 Staff telephones HHA, Nursing Homes / Clarify orders / etc []  - 0 Routine Transfer to another Facility (non-emergent condition) []  - 0 Routine Hospital Admission (non-emergent condition) []  - 0 New Admissions / Biomedical engineer / Ordering NPWT, Apligraf, etc. []  - 0 Emergency Hospital Admission (emergent condition) PROCESS - Special Needs []  - Pediatric / Minor Patient Management 0 []  - 0 Isolation Patient Management []  - 0 Hearing / Language / Visual special needs []  - 0 Assessment of Community assistance (transportation, D/C planning, etc.) []  - 0 Additional assistance / Altered mentation []  - 0 Support Surface(s) Assessment (bed, cushion, seat, etc.) INTERVENTIONS - Miscellaneous []  - External ear exam 0 []  - 0 Patient Transfer (multiple staff / Civil Service fast streamer / Similar devices) []  - 0 Simple Staple / Suture removal (25 or less) []  - 0 Complex Staple / Suture removal (26 or more) []  - 0 Hypo/Hyperglycemic Management (do not check if billed separately) []  - 0 Ankle / Brachial Index (ABI) - do not  check if billed separately Has the patient been seen at the hospital within the last three years: Yes Total Score: 0 Level  Of Care: ____ Alex Boyle ([id number]) Electronic Signature(s) Signed: 03/31/2021 4:19:44 PM By: Carlene Coria RN Entered By: Carlene Coria on 03/28/2021 15:35:51 Alex Boyle ([id number]) -------------------------------------------------------------------------------- Encounter Discharge Information Details Patient Name: Alex Boyle Date of Service: 03/28/2021 3:00 PM Medical Record Number: [id number] Patient Account Number: [id number] Date of Birth/Sex: [birth year] (75 y.o. Male) Treating RN: Carlene Coria Primary Care Anwen Cannedy: Abelino Derrick Other Clinician: Referring Romayne Ticas: Abelino Derrick Treating Camiya Vinal/Extender: Skipper Cliche in Treatment: 9 Encounter Discharge Information Items Post Procedure Vitals Discharge Condition: Stable Temperature (F): 98.1 Ambulatory Status: Ambulatory Pulse (bpm): 69 Discharge Destination: Home Respiratory Rate (breaths/min): 18 Transportation: Private Auto Blood Pressure (mmHg): 106/68 Accompanied By: self Schedule Follow-up Appointment: Yes Clinical Summary of Care: Patient Declined Electronic Signature(s) Signed: 03/31/2021 4:19:44 PM By: Carlene Coria RN Entered By: Carlene Coria on 03/28/2021 15:40:22 Alex Boyle ([id number]) -------------------------------------------------------------------------------- Lower Extremity Assessment Details Patient Name: Alex Boyle Date of Service: 03/28/2021 3:00 PM Medical Record Number: [id number] Patient Account Number: [id number] Date of Birth/Sex: Oct 17, 1945 (75 y.o. Male) Treating RN: Carlene Coria Primary Care Etoy Mcdonnell: Abelino Derrick Other Clinician: Referring Faduma Cho: Abelino Derrick Treating Kenshin Splawn/Extender: Jeri Cos Weeks in Treatment: 9 Electronic Signature(s) Signed: 03/31/2021 4:19:44 PM By: Carlene Coria RN Entered By: Carlene Coria on  03/28/2021 15:31:46 Alex Boyle ([id number]) -------------------------------------------------------------------------------- Multi Wound Chart Details Patient Name: Alex Boyle Date of Service: 03/28/2021 3:00 PM Medical Record Number: [id number] Patient Account Number: [id number] Date of Birth/Sex: Nov 07, 1945 (75 y.o. Male) Treating RN: Carlene Coria Primary Care Eh Sesay: Abelino Derrick Other Clinician: Referring Audry Kauzlarich: Abelino Derrick Treating Rorik Vespa/Extender: Skipper Cliche in Treatment: 9 Vital Signs Height(in): Pulse(bpm): 9 Weight(lbs): 220 Blood Pressure(mmHg): 106/68 Body Mass Index(BMI): Temperature(F): 98.1 Respiratory Rate(breaths/min): 18 Photos: [N/A:N/A] Wound Location: Right Toe Second N/A N/A Wounding Event: Surgical Injury N/A N/A Primary Etiology: Open Surgical Wound N/A N/A Comorbid History: Asthma, Sleep Apnea, N/A N/A Hypertension, Myocardial Infarction, Neuropathy Date Acquired: 06/11/2020 N/A N/A Weeks of Treatment: 9 N/A N/A Wound Status: Open N/A N/A Pending Amputation on Yes N/A N/A Presentation: Measurements L x W x D (cm) 0.1x0.1x0.1 N/A N/A Area (cm) : 0.008 N/A N/A Volume (cm) : 0.001 N/A N/A % Reduction in Area: 95.90% N/A N/A % Reduction in Volume: 98.70% N/A N/A Classification: Full Thickness With Exposed N/A N/A Support Structures Exudate Amount: Medium N/A N/A Exudate Type: Serosanguineous N/A N/A Exudate Color: red, brown N/A N/A Granulation Amount: Small (1-33%) N/A N/A Granulation Quality: Red, Pink N/A N/A Necrotic Amount: Large (67-100%) N/A N/A Exposed Structures: Fat Layer (Subcutaneous Tissue): N/A N/A Yes Tendon: Yes Fascia: No Muscle: No Joint: No Bone: No Epithelialization: Small (1-33%) N/A N/A Treatment Notes Electronic Signature(s) Signed: 03/31/2021 4:19:44 PM By: Carlene Coria RN Entered By: Carlene Coria on 03/28/2021 15:32:15 Alex Boyle  ([id number]) -------------------------------------------------------------------------------- [place] Details Patient Name: Alex Boyle Date of Service: 03/28/2021 3:00 PM Medical Record Number: [id number] Patient Account Number: [id number] Date of Birth/Sex: [birth year] (75 y.o. Male) Treating RN: Carlene Coria Primary Care Charmaine Placido: Abelino Derrick Other Clinician: Referring Dilan Fullenwider: Abelino Derrick Treating Ronelle Smallman/Extender: Skipper Cliche in Treatment: 9 Active Inactive Wound/Skin Impairment Nursing Diagnoses: Impaired tissue integrity Goals: Patient/caregiver will verbalize understanding of skin care regimen Date Initiated: 01/20/2021 Date Inactivated: 02/14/2021 Target Resolution Date: 01/20/2021 Goal Status: Met Ulcer/skin breakdown will have a volume reduction of 30% by week 4 Date Initiated: 01/20/2021 Target Resolution Date: 02/20/2021 Goal Status: Active Ulcer/skin breakdown will have a volume reduction of 50% by week 8 Date  Initiated: 01/20/2021 Target Resolution Date: 03/22/2021 Goal Status: Active Ulcer/skin breakdown will have a volume reduction of 80% by week 12 Date Initiated: 01/20/2021 Target Resolution Date: 04/22/2021 Goal Status: Active Ulcer/skin breakdown will heal within 14 weeks Date Initiated: 01/20/2021 Target Resolution Date: 05/22/2021 Goal Status: Active Interventions: Assess patient/caregiver ability to obtain necessary supplies Assess patient/caregiver ability to perform ulcer/skin care regimen upon admission and as needed Assess ulceration(s) every visit Provide education on ulcer and skin care Treatment Activities: Skin care regimen initiated : 01/20/2021 Notes: Electronic Signature(s) Signed: 03/31/2021 4:19:44 PM By: Carlene Coria RN Entered By: Carlene Coria on 03/28/2021 15:32:01 Alex Boyle ([id number]) -------------------------------------------------------------------------------- Pain Assessment  Details Patient Name: Alex Boyle Date of Service: 03/28/2021 3:00 PM Medical Record Number: [id number] Patient Account Number: [id number] Date of Birth/Sex: 12/08/1945 (75 y.o. Male) Treating RN: Carlene Coria Primary Care Delvis Kau: Abelino Derrick Other Clinician: Referring Azani Brogdon: Abelino Derrick Treating Vallorie Niccoli/Extender: Skipper Cliche in Treatment: 9 Active Problems Location of Pain Severity and Description of Pain Patient Has Paino No Site Locations Pain Management and Medication Current Pain Management: Electronic Signature(s) Signed: 03/31/2021 4:19:44 PM By: Carlene Coria RN Entered By: Carlene Coria on 03/28/2021 15:23:13 Alex Boyle ([id number]) -------------------------------------------------------------------------------- Patient/Caregiver Education Details Patient Name: Alex Boyle Date of Service: 03/28/2021 3:00 PM Medical Record Number: [id number] Patient Account Number: [id number] Date of Birth/Gender: [birth year] (75 y.o. Male) Treating RN: Carlene Coria Primary Care Physician: Abelino Derrick Other Clinician: Referring Physician: Abelino Derrick Treating Physician/Extender: Skipper Cliche in Treatment: 9 Education Assessment Education Provided To: Patient Education Topics Provided Wound/Skin Impairment: Methods: Explain/Verbal Responses: State content correctly Electronic Signature(s) Signed: 03/31/2021 4:19:44 PM By: Carlene Coria RN Entered By: Carlene Coria on 03/28/2021 15:36:07 Alex Boyle ([id number]) -------------------------------------------------------------------------------- Wound Assessment Details Patient Name: Alex Boyle Date of Service: 03/28/2021 3:00 PM Medical Record Number: [id number] Patient Account Number: [id number] Date of Birth/Sex: 05/16/1946 (75 y.o. Male) Treating RN: Carlene Coria Primary Care Graylin Sperling: Abelino Derrick Other Clinician: Referring Khaza Blansett: Abelino Derrick Treating Jamayia Croker/Extender:  Skipper Cliche in Treatment: 9 Wound Status Wound Number: 1 Primary Open Surgical Wound Etiology: Wound Location: Right Toe Second Wound Status: Open Wounding Event: Surgical Injury Comorbid Asthma, Sleep Apnea, Hypertension, Myocardial Date Acquired: 06/11/2020 History: Infarction, Neuropathy Weeks Of Treatment: 9 Clustered Wound: No Pending Amputation On Presentation Photos Wound Measurements Length: (cm) 0.1 Width: (cm) 0.1 Depth: (cm) 0.1 Area: (cm) 0.008 Volume: (cm) 0.001 % Reduction in Area: 95.9% % Reduction in Volume: 98.7% Epithelialization: Small (1-33%) Tunneling: No Undermining: No Wound Description Classification: Full Thickness With Exposed Support Structures Exudate Amount: Medium Exudate Type: Serosanguineous Exudate Color: red, brown Foul Odor After Cleansing: No Slough/Fibrino Yes Wound Bed Granulation Amount: Small (1-33%) Exposed Structure Granulation Quality: Red, Pink Fascia Exposed: No Necrotic Amount: Large (67-100%) Fat Layer (Subcutaneous Tissue) Exposed: Yes Tendon Exposed: Yes Muscle Exposed: No Joint Exposed: No Bone Exposed: No Treatment Notes Wound #1 (Toe Second) Wound Laterality: Right Cleanser Normal Saline Discharge Instruction: Wash your hands with soap and water. Remove old dressing, discard into plastic bag and place into trash. Cleanse the wound with Normal Saline prior to applying a clean dressing using gauze sponges, not tissues or cotton balls. Do not scrub or use excessive force. Pat dry using gauze sponges, not tissue or cotton balls. Alex Boyle, Alex Boyle ([id number]) Peri-Wound Care Topical Primary Dressing Xeroform 4x4-HBD (in/in) Discharge Instruction: Apply Xeroform 4x4-HBD (in/in) as directed Secondary Dressing [place] Roll-Small Discharge Instruction: Apply Conforming Stretch Guaze Bandage as directed Gauze  Secured With 95M Medipore H Soft Cloth Surgical Tape, 2x2 (in/yd) Discharge Instruction:  Secure conform Compression Wrap Compression Stockings Add-Ons Electronic Signature(s) Signed: 03/31/2021 4:19:44 PM By: Carlene Coria RN Entered By: Carlene Coria on 03/28/2021 15:31:31 Alex Boyle ([id number]) -------------------------------------------------------------------------------- Vitals Details Patient Name: Alex Boyle Date of Service: 03/28/2021 3:00 PM Medical Record Number: [id number] Patient Account Number: [id number] Date of Birth/Sex: 1945-07-09 (75 y.o. Male) Treating RN: Carlene Coria Primary Care Aeralyn Barna: Abelino Derrick Other Clinician: Referring Vermon Grays: Abelino Derrick Treating Laprecious Austill/Extender: Skipper Cliche in Treatment: 9 Vital Signs Time Taken: 15:22 Temperature (F): 98.1 Weight (lbs): 220 Pulse (bpm): 69 Respiratory Rate (breaths/min): 18 Blood Pressure (mmHg): 106/68 Reference Range: 80 - 120 mg / dl Electronic Signature(s) Signed: 03/31/2021 4:19:44 PM By: Carlene Coria RN Entered By: Carlene Coria on 03/28/2021 15:23:06

## 2021-04-04 ENCOUNTER — Other Ambulatory Visit: Payer: Self-pay

## 2021-04-04 ENCOUNTER — Encounter: Payer: Federal, State, Local not specified - PPO | Admitting: Physician Assistant

## 2021-04-04 DIAGNOSIS — I2510 Atherosclerotic heart disease of native coronary artery without angina pectoris: Secondary | ICD-10-CM | POA: Diagnosis not present

## 2021-04-04 DIAGNOSIS — T8131XA Disruption of external operation (surgical) wound, not elsewhere classified, initial encounter: Secondary | ICD-10-CM | POA: Diagnosis not present

## 2021-04-04 DIAGNOSIS — I10 Essential (primary) hypertension: Secondary | ICD-10-CM | POA: Diagnosis not present

## 2021-04-04 DIAGNOSIS — L97516 Non-pressure chronic ulcer of other part of right foot with bone involvement without evidence of necrosis: Secondary | ICD-10-CM | POA: Diagnosis not present

## 2021-04-04 NOTE — Progress Notes (Addendum)
JOHNNELL, LIOU ([id number]) Visit Report for 04/04/2021 Arrival Information Details Patient Name: Alex Boyle, Alex Boyle. Date of Service: 04/04/2021 1:45 PM Medical Record Number: [id number] Patient Account Number: [id number] Date of Birth/Sex: 1946-03-25 (75 y.o. M) Treating RN: Donnamarie Poag Primary Care Margree Gimbel: Abelino Derrick Other Clinician: Referring Johnte Portnoy: Abelino Derrick Treating Xxavier Noon/Extender: Skipper Cliche in Treatment: 10 Visit Information History Since Last Visit Added or deleted any medications: No Patient Arrived: Ambulatory Had a fall or experienced change in No Arrival Time: 14:16 activities of daily living that may affect Accompanied By: self risk of falls: Transfer Assistance: None Hospitalized since last visit: No Patient Identification Verified: Yes Has Dressing in Place as Prescribed: Yes Secondary Verification Process Completed: Yes Pain Present Now: No Patient Requires Transmission-Based Precautions: No Patient Has Alerts: No Electronic Signature(s) Signed: 04/04/2021 4:40:41 PM By: Donnamarie Poag Entered By: Donnamarie Poag on 04/04/2021 14:18:38 Alex Boyle ([id number]) -------------------------------------------------------------------------------- Clinic Level of Care Assessment Details Patient Name: Alex Boyle Date of Service: 04/04/2021 1:45 PM Medical Record Number: [id number] Patient Account Number: [id number] Date of Birth/Sex: 09/02/1945 (75 y.o. M) Treating RN: Donnamarie Poag Primary Care Faith Branan: Abelino Derrick Other Clinician: Referring Patrick Sohm: Abelino Derrick Treating Demetrie Borge/Extender: Skipper Cliche in Treatment: 10 Clinic Level of Care Assessment Items TOOL 1 Quantity Score []  - Use when EandM and Procedure is performed on INITIAL visit 0 ASSESSMENTS - Nursing Assessment / Reassessment []  - General Physical Exam (combine w/ comprehensive assessment (listed just below) when performed on new 0 pt. evals) []  -  0 Comprehensive Assessment (HX, ROS, Risk Assessments, Wounds Hx, etc.) ASSESSMENTS - Wound and Skin Assessment / Reassessment []  - Dermatologic / Skin Assessment (not related to wound area) 0 ASSESSMENTS - Ostomy and/or Continence Assessment and Care []  - Incontinence Assessment and Management 0 []  - 0 Ostomy Care Assessment and Management (repouching, etc.) PROCESS - Coordination of Care []  - Simple Patient / Family Education for ongoing care 0 []  - 0 Complex (extensive) Patient / Family Education for ongoing care []  - 0 Staff obtains Programmer, systems, Records, Test Results / Process Orders []  - 0 Staff telephones HHA, Nursing Homes / Clarify orders / etc []  - 0 Routine Transfer to another Facility (non-emergent condition) []  - 0 Routine Hospital Admission (non-emergent condition) []  - 0 New Admissions / Biomedical engineer / Ordering NPWT, Apligraf, etc. []  - 0 Emergency Hospital Admission (emergent condition) PROCESS - Special Needs []  - Pediatric / Minor Patient Management 0 []  - 0 Isolation Patient Management []  - 0 Hearing / Language / Visual special needs []  - 0 Assessment of Community assistance (transportation, D/C planning, etc.) []  - 0 Additional assistance / Altered mentation []  - 0 Support Surface(s) Assessment (bed, cushion, seat, etc.) INTERVENTIONS - Miscellaneous []  - External ear exam 0 []  - 0 Patient Transfer (multiple staff / Civil Service fast streamer / Similar devices) []  - 0 Simple Staple / Suture removal (25 or less) []  - 0 Complex Staple / Suture removal (26 or more) []  - 0 Hypo/Hyperglycemic Management (do not check if billed separately) []  - 0 Ankle / Brachial Index (ABI) - do not check if billed separately Has the patient been seen at the hospital within the last three years: Yes Total Score: 0 Level Of Care: ____ Alex Boyle ([id number]) Electronic Signature(s) Signed: 04/04/2021 4:40:41 PM By: Donnamarie Poag Entered By: Donnamarie Poag on 04/04/2021  14:37:31 Alex Boyle ([id number]) -------------------------------------------------------------------------------- Encounter Discharge Information Details Patient Name: Alex Boyle Date of Service: 04/04/2021 1:45 PM Medical  Record Number: [id number] Patient Account Number: [id number] Date of Birth/Sex: 10-28-1945 (75 y.o. M) Treating RN: Donnamarie Poag Primary Care Midas Daughety: Abelino Derrick Other Clinician: Referring Dantre Yearwood: Abelino Derrick Treating Mackinzie Vuncannon/Extender: Skipper Cliche in Treatment: 10 Encounter Discharge Information Items Post Procedure Vitals Discharge Condition: Stable Temperature (F): 98 Ambulatory Status: Ambulatory Pulse (bpm): 65 Discharge Destination: Home Respiratory Rate (breaths/min): 16 Transportation: Private Auto Blood Pressure (mmHg): 109/65 Accompanied By: self Schedule Follow-up Appointment: Yes Clinical Summary of Care: Electronic Signature(s) Signed: 04/04/2021 4:40:41 PM By: Donnamarie Poag Entered By: Donnamarie Poag on 04/04/2021 14:39:02 Alex Boyle ([id number]) -------------------------------------------------------------------------------- Lower Extremity Assessment Details Patient Name: Alex Boyle Date of Service: 04/04/2021 1:45 PM Medical Record Number: [id number] Patient Account Number: [id number] Date of Birth/Sex: March 16, 1946 (75 y.o. M) Treating RN: Donnamarie Poag Primary Care Jary Louvier: Abelino Derrick Other Clinician: Referring Alaster Asfaw: Abelino Derrick Treating Efrat Zuidema/Extender: Jeri Cos Weeks in Treatment: 10 Edema Assessment Assessed: Shirlyn Goltz: No] Patrice Paradise: Yes] [Left: Edema] [Right: :] Vascular Assessment Pulses: Dorsalis Pedis Palpable: [Right:Yes] Electronic Signature(s) Signed: 04/04/2021 4:40:41 PM By: Donnamarie Poag Entered By: Donnamarie Poag on 04/04/2021 14:24:43 Rolon, Angus Seller ([id number]) -------------------------------------------------------------------------------- Multi Wound Chart Details Patient Name:  Alex Boyle Date of Service: 04/04/2021 1:45 PM Medical Record Number: [id number] Patient Account Number: [id number] Date of Birth/Sex: Jul 22, 1945 (75 y.o. M) Treating RN: Donnamarie Poag Primary Care Asya Derryberry: Abelino Derrick Other Clinician: Referring Lilley Hubble: Abelino Derrick Treating Aleathea Pugmire/Extender: Skipper Cliche in Treatment: 10 Vital Signs Height(in): Pulse(bpm): 65 Weight(lbs): 220 Blood Pressure(mmHg): 109/65 Body Mass Index(BMI): Temperature(F): 98.0 Respiratory Rate(breaths/min): 16 Photos: [N/A:N/A] Wound Location: Right Toe Second N/A N/A Wounding Event: Surgical Injury N/A N/A Primary Etiology: Open Surgical Wound N/A N/A Comorbid History: Asthma, Sleep Apnea, N/A N/A Hypertension, Myocardial Infarction, Neuropathy Date Acquired: 06/11/2020 N/A N/A Weeks of Treatment: 10 N/A N/A Wound Status: Open N/A N/A Pending Amputation on Yes N/A N/A Presentation: Measurements L x W x D (cm) 0.1x0.1x0.1 N/A N/A Area (cm) : 0.008 N/A N/A Volume (cm) : 0.001 N/A N/A % Reduction in Area: 95.90% N/A N/A % Reduction in Volume: 98.70% N/A N/A Classification: Full Thickness With Exposed N/A N/A Support Structures Exudate Amount: Medium N/A N/A Exudate Type: Serosanguineous N/A N/A Exudate Color: red, brown N/A N/A Granulation Amount: Small (1-33%) N/A N/A Granulation Quality: Red, Pink N/A N/A Necrotic Amount: Large (67-100%) N/A N/A Necrotic Tissue: Eschar, Adherent Slough N/A N/A Exposed Structures: Fat Layer (Subcutaneous Tissue): N/A N/A Yes Fascia: No Tendon: No Muscle: No Joint: No Bone: No Epithelialization: Small (1-33%) N/A N/A Treatment Notes Electronic Signature(s) Signed: 04/04/2021 4:40:41 PM By: Donnamarie Poag Entered By: Donnamarie Poag on 04/04/2021 14:30:17 Alex Boyle ([id number] Boyle ([id number]) -------------------------------------------------------------------------------- Multi-Disciplinary Care Plan Details Patient Name:  Alex Boyle Date of Service: 04/04/2021 1:45 PM Medical Record Number: [id number] Patient Account Number: [id number] Date of Birth/Sex: 1946/04/12 (75 y.o. M) Treating RN: Donnamarie Poag Primary Care Donis Pinder: Abelino Derrick Other Clinician: Referring Chardai Gangemi: Abelino Derrick Treating Jeferson Boozer/Extender: Skipper Cliche in Treatment: 10 Active Inactive Wound/Skin Impairment Nursing Diagnoses: Impaired tissue integrity Goals: Patient/caregiver will verbalize understanding of skin care regimen Date Initiated: 01/20/2021 Date Inactivated: 02/14/2021 Target Resolution Date: 01/20/2021 Goal Status: Met Ulcer/skin breakdown will have a volume reduction of 30% by week 4 Date Initiated: 01/20/2021 Date Inactivated: 04/04/2021 Target Resolution Date: 02/20/2021 Goal Status: Met Ulcer/skin breakdown will have a volume reduction of 50% by week 8 Date Initiated: 01/20/2021 Target Resolution Date: 03/22/2021 Goal Status: Active Ulcer/skin breakdown will have a volume reduction of 80%  by week 12 Date Initiated: 01/20/2021 Target Resolution Date: 04/22/2021 Goal Status: Active Ulcer/skin breakdown will heal within 14 weeks Date Initiated: 01/20/2021 Target Resolution Date: 05/22/2021 Goal Status: Active Interventions: Assess patient/caregiver ability to obtain necessary supplies Assess patient/caregiver ability to perform ulcer/skin care regimen upon admission and as needed Assess ulceration(s) every visit Provide education on ulcer and skin care Treatment Activities: Skin care regimen initiated : 01/20/2021 Notes: Electronic Signature(s) Signed: 04/04/2021 4:40:41 PM By: Donnamarie Poag Entered By: Donnamarie Poag on 04/04/2021 14:30:04 Alex Boyle ([id number]) -------------------------------------------------------------------------------- Pain Assessment Details Patient Name: Alex Boyle Date of Service: 04/04/2021 1:45 PM Medical Record Number: [id number] Patient Account Number:  [id number] Date of Birth/Sex: [birth year] (75 y.o. M) Treating RN: Donnamarie Poag Primary Care Smt. Loder: Abelino Derrick Other Clinician: Referring Rokhaya Quinn: Abelino Derrick Treating Tanveer Brammer/Extender: Skipper Cliche in Treatment: 10 Active Problems Location of Pain Severity and Description of Pain Patient Has Paino No Site Locations Rate the pain. Current Pain Level: 0 Pain Management and Medication Current Pain Management: Electronic Signature(s) Signed: 04/04/2021 4:40:41 PM By: Donnamarie Poag Entered By: Donnamarie Poag on 04/04/2021 14:21:29 Alex Boyle ([id number]) -------------------------------------------------------------------------------- Patient/Caregiver Education Details Patient Name: Alex Boyle Date of Service: 04/04/2021 1:45 PM Medical Record Number: [id number] Patient Account Number: [id number] Date of Birth/Gender: 1945-08-26 (75 y.o. M) Treating RN: Donnamarie Poag Primary Care Physician: Abelino Derrick Other Clinician: Referring Physician: Abelino Derrick Treating Physician/Extender: Skipper Cliche in Treatment: 10 Education Assessment Education Provided To: Patient Education Topics Provided Basic Hygiene: Wound/Skin Impairment: Electronic Signature(s) Signed: 04/04/2021 4:40:41 PM By: Donnamarie Poag Entered By: Donnamarie Poag on 04/04/2021 14:33:02 Alex Boyle ([id number]) -------------------------------------------------------------------------------- Wound Assessment Details Patient Name: Alex Boyle Date of Service: 04/04/2021 1:45 PM Medical Record Number: [id number] Patient Account Number: [id number] Date of Birth/Sex: [birth year] (75 y.o. M) Treating RN: Donnamarie Poag Primary Care Chancie Lampert: Abelino Derrick Other Clinician: Referring Dare Sanger: Abelino Derrick Treating Ceniyah Thorp/Extender: Skipper Cliche in Treatment: 10 Wound Status Wound Number: 1 Primary Open Surgical Wound Etiology: Wound Location: Right Toe Second Wound Status:  Open Wounding Event: Surgical Injury Comorbid Asthma, Sleep Apnea, Hypertension, Myocardial Date Acquired: 06/11/2020 History: Infarction, Neuropathy Weeks Of Treatment: 10 Clustered Wound: No Pending Amputation On Presentation Photos Wound Measurements Length: (cm) 0.1 Width: (cm) 0.1 Depth: (cm) 0.1 Area: (cm) 0.008 Volume: (cm) 0.001 % Reduction in Area: 95.9% % Reduction in Volume: 98.7% Epithelialization: Small (1-33%) Tunneling: No Undermining: No Wound Description Classification: Full Thickness With Exposed Support Structures Exudate Amount: Medium Exudate Type: Serosanguineous Exudate Color: red, brown Foul Odor After Cleansing: No Slough/Fibrino Yes Wound Bed Granulation Amount: Small (1-33%) Exposed Structure Granulation Quality: Red, Pink Fascia Exposed: No Necrotic Amount: Large (67-100%) Fat Layer (Subcutaneous Tissue) Exposed: Yes Necrotic Quality: Eschar, Adherent Slough Tendon Exposed: No Muscle Exposed: No Joint Exposed: No Bone Exposed: No Treatment Notes Wound #1 (Toe Second) Wound Laterality: Right Cleanser Normal Saline Discharge Instruction: Wash your hands with soap and water. Remove old dressing, discard into plastic bag and place into trash. Cleanse the wound with Normal Saline prior to applying a clean dressing using gauze sponges, not tissues or cotton balls. Do not scrub or use excessive force. Pat dry using gauze sponges, not tissue or cotton balls. RALIEGH, SCOBIE ([id number]) Peri-Wound Care Topical Primary Dressing Xeroform 4x4-HBD (in/in) Discharge Instruction: Apply Xeroform 4x4-HBD (in/in) as directed Secondary Dressing Harlan Roll-Small Discharge Instruction: Apply Conforming Stretch Guaze Bandage as directed Gauze Secured With 64M Medipore H Soft Cloth Surgical Tape, 2x2 (in/yd) Discharge Instruction:  Secure conform Compression Wrap Compression Stockings Add-Ons Electronic Signature(s) Signed: 04/04/2021  4:40:41 PM By: Donnamarie Poag Entered By: Donnamarie Poag on 04/04/2021 14:24:15 Alex Boyle ([id number]) -------------------------------------------------------------------------------- Vitals Details Patient Name: Alex Boyle Date of Service: 04/04/2021 1:45 PM Medical Record Number: [id number] Patient Account Number: [id number] Date of Birth/Sex: Nov 23, 1945 (75 y.o. M) Treating RN: Donnamarie Poag Primary Care Sahan Pen: Abelino Derrick Other Clinician: Referring Adelyne Marchese: Abelino Derrick Treating Kylo Gavin/Extender: Skipper Cliche in Treatment: 10 Vital Signs Time Taken: 14:18 Temperature (F): 98.0 Weight (lbs): 220 Pulse (bpm): 65 Respiratory Rate (breaths/min): 16 Blood Pressure (mmHg): 109/65 Reference Range: 80 - 120 mg / dl Electronic Signature(s) Signed: 04/04/2021 4:40:41 PM By: Donnamarie Poag Entered ByDonnamarie Poag on 04/04/2021 14:21:22

## 2021-04-04 NOTE — Progress Notes (Addendum)
Alex, Boyle ([id number]) Visit Report for 04/04/2021 Chief Complaint Document Details Patient Name: Alex Boyle, Alex Boyle. Date of Service: 04/04/2021 1:45 PM Medical Record Number: [id number] Patient Account Number: [id number] Date of Birth/Sex: 1945/10/03 (75 y.o. M) Treating RN: Donnamarie Poag Primary Care Provider: Abelino Derrick Other Clinician: Referring Provider: Abelino Derrick Treating Provider/Extender: Skipper Cliche in Treatment: 10 Information Obtained from: Patient Chief Complaint Right 2nd Toe Ulcer Electronic Signature(s) Signed: 04/04/2021 1:50:48 PM By: Worthy Keeler PA-C Entered By: Worthy Keeler on 04/04/2021 13:50:48 Alex Boyle ([id number]) -------------------------------------------------------------------------------- Debridement Details Patient Name: Alex Boyle Date of Service: 04/04/2021 1:45 PM Medical Record Number: [id number] Patient Account Number: [id number] Date of Birth/Sex: [birth year] (75 y.o. M) Treating RN: Donnamarie Poag Primary Care Provider: Abelino Derrick Other Clinician: Referring Provider: Abelino Derrick Treating Provider/Extender: Skipper Cliche in Treatment: 10 Debridement Performed for Wound #1 Right Toe Second Assessment: Performed By: Physician Tommie Sams., PA-C Debridement Type: Debridement Level of Consciousness (Pre- Awake and Alert procedure): Pre-procedure Verification/Time Out Yes - 14:32 Taken: Start Time: 14:32 Pain Control: Lidocaine Total Area Debrided (L x W): 0.2 (cm) x 0.2 (cm) = 0.04 (cm) Tissue and other material Viable, Non-Viable, Eschar, Slough, Subcutaneous, Slough debrided: Level: Skin/Subcutaneous Tissue Debridement Description: Excisional Instrument: Curette Bleeding: None Response to Treatment: Procedure was tolerated well Level of Consciousness (Post- Awake and Alert procedure): Post Debridement Measurements of Total Wound Length: (cm) 0.1 Width: (cm) 0.1 Depth: (cm)  0.2 Volume: (cm) 0.002 Character of Wound/Ulcer Post Debridement: Improved Post Procedure Diagnosis Same as Pre-procedure Electronic Signature(s) Signed: 04/04/2021 4:40:41 PM By: Donnamarie Poag Signed: 04/05/2021 4:54:49 PM By: Worthy Keeler PA-C Entered By: Donnamarie Poag on 04/04/2021 14:38:11 Alex Boyle ([id number]) -------------------------------------------------------------------------------- HPI Details Patient Name: Alex Boyle Date of Service: 04/04/2021 1:45 PM Medical Record Number: [id number] Patient Account Number: [id number] Date of Birth/Sex: 03-10-1946 (75 y.o. M) Treating RN: Donnamarie Poag Primary Care Provider: Abelino Derrick Other Clinician: Referring Provider: Abelino Derrick Treating Provider/Extender: Skipper Cliche in Treatment: 10 History of Present Illness HPI Description: 01/20/2021 upon evaluation today patient appears to be doing somewhat poorly in regard to a surgery that he had that was actually on 06/10/2020. He tells me that the toe was subsequently a hammertoe and this needed to be corrected by surgical intervention. When they got and the joint was so deteriorated and ended up having to actually pin and fuse the toe. This no longer Britta Mccreedy has given him some trouble he tells me. With that being said he has been seeing [facility], Dr. Sherryle Lis, and the only thing that is really been mentioned is going to try to remove a portion of the toe in order to basically it sounds like refuse this. Nonetheless I really do not know that the patient is interested in going down that road at this point based on what I am hearing. He does have noted culture on 12/22/2020 which was positive for Serratia. Nonetheless he has been on doxycycline though that just ended today. I would probably extend this for him due to the fact that I want him to be on this plenty in order to justify a good trial of antibiotics as well as wound care before potential for hyperbarics  if he does indeed have osteomyelitis noted. The good news is his ABIs are on the right 1.38 with a TBI of 0.79 and on the left 1.21 with a TBI of 0.70. This was on 11/23/2020. Notably the patient is obviously  very frustrated with the situation he has not had an MRI at this point since the surgery. I think that is really what we need to look into doing. Recently well8/22/2022 upon evaluation today patient appears to be doing regard to his toe ulcer. This is definitely not significantly improved compared to previous but is also not doing any worse to be honest. I did review his MRI today as well. With that being said I do not see any signs of definitive osteomyelitis currently. There was some mention of abnormal T1 and T2 signal intensity in the proximal and middle phalange ease of the second toe which could be due to recent surgery/stress reaction although osteomyelitis could not be excluded. Being that the bone at this location seems to be very solid there is no signs of flaking and in general the patient's toe does not appear to be showing signs of significant warmth with the erythema although there is some redness I think this is more just inflammatory as a result of the surgery. My gut feeling is that this does not represent osteomyelitis based on what I am seeing physically as well. The MRI also does not identify any signs of infection anywhere else in the foot which is also good news. In general this is very reassuring in my opinion. All of this was discussed with the patient during the office visit today. 02/06/2021 upon evaluation today patient's toe actually appears to be doing decently well today. There is still bone noted in the base of the toe but in general I think we are making some progress and this seems to be closing in quite nicely. With that being said he is going require little bit of debridement today. Also did discuss with him that we got the results back from the reaching out to  the insurance company about the [place] unfortunately that was denied as a not a covered service. In the meantime I think continuing with the collagen is probably the best way to go. 02/14/2021 upon evaluation today patient appears to be doing well with regard to his toe ulcer. He has been tolerating the dressing changes without complication. Fortunately I do think that though he is doing better and this looks much better no longer erythematous the doxycycline in that regard. Nonetheless I do believe that he is still having some issues here to be honest with some necrotic debris buildup and we are getting need to clean this away. He is in agreement with that plan. 02/20/2021 upon evaluation today patient's toe actually again showed signs of the collagen being somewhat dried into the wound trapping fluid underneath. I think this is actually more detrimental than beneficial. There is some additional granulation today which is good news but in general I feel like the biggest issue is that we need to do what we can to try to prevent having ongoing and continued issues with this occurring. For that reason I think switching to [facility] Blue rope that we can put a small piece into the wound and then moisten would be the ideal thing to fill the space and prevent fluid trapping. 02/27/2021 upon evaluation today patient's toe ulcer actually showing some signs of improvement. This is very slow to progress but nonetheless I do feel like he is showing signs of greater improvement especially compared to where we started is also really not showing any signs of infection which is also great news. Overall I am extremely pleased with where we stand today. 9/26; right second DIP.  Initially a surgical wound to straighten a hammer deformity. Apparently this was done in December 2021. Still a small probing hole at 1 point this went down to bone. His wife reports she can no longer get Hydrofera Blue packing rope into the  wound 10/11 right second DIP. Absolutely no improvement. This is a deep probing wound. It does not go to bone but there cannot be much tissue over this. This is just distal to the DIP itself. Last 2 weeks we have been using endoform 03/28/2021 upon evaluation today patient appears to be doing well with regard to his toe ulcer. The only thing is that the Iodoflex keeps drying up in the hole and then trapping fluid underneath as what was seen today. Nonetheless I do believe we did clean this out and then subsequently it may just be good for [place] to use like a Vaseline gauze such as Xeroform gauze to treat the area this should keep it moist without drying out and hopefully allow this to heal from the inside out the last little bit this wound is significantly better than what it was in the past. 04/04/2021 upon evaluation today patient appears to be doing decently well in regard to his toe ulcer. Very pleased with where things stand today. He does not appear to be any signs of active infection which is great news. No fevers, chills, nausea, vomiting, or diarrhea. Electronic Signature(s) Signed: 04/04/2021 2:50:10 PM By: Worthy Keeler PA-C Entered By: Worthy Keeler on 04/04/2021 14:50:09 Alex Boyle ([id number]) -------------------------------------------------------------------------------- Physical Exam Details Patient Name: Alex Boyle Date of Service: 04/04/2021 1:45 PM Medical Record Number: [id number] Patient Account Number: [id number] Date of Birth/Sex: Feb 26, 1946 (75 y.o. M) Treating RN: Donnamarie Poag Primary Care Provider: Abelino Derrick Other Clinician: Referring Provider: Abelino Derrick Treating Provider/Extender: Skipper Cliche in Treatment: 15 Constitutional Well-nourished and well-hydrated in no acute distress. Respiratory normal breathing without difficulty. Psychiatric this patient is able to make decisions and demonstrates good insight into disease process. Alert  and Oriented x 3. pleasant and cooperative. Notes Upon inspection patient's wound bed showed signs of good granulation epithelization at this point. Fortunately there does not appear to be any signs of active infection systemically which is great news and overall I am extremely pleased with where things stand at this point. No fevers, chills, nausea, vomiting, or diarrhea. I did perform a very light debridement to clear away some of the necrotic debris this showed that he has a very tiny opening still in the middle of the wound but again this is significantly better even compared to last week and overall I think that is very close to complete resolution. Electronic Signature(s) Signed: 04/04/2021 2:50:44 PM By: Worthy Keeler PA-C Entered By: Worthy Keeler on 04/04/2021 14:50:43 Alex Boyle ([id number]) -------------------------------------------------------------------------------- Physician Orders Details Patient Name: Alex Boyle Date of Service: 04/04/2021 1:45 PM Medical Record Number: [id number] Patient Account Number: [id number] Date of Birth/Sex: 04-20-1946 (75 y.o. 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 o Nurse Visit  as needed Midwife Hygiene o May shower; gently cleanse wound with antibacterial soap, rinse and pat dry prior to dressing wounds o  No tub bath. Edema Control - Lymphedema / Segmental Compressive Device / Other o Elevate leg(s) parallel to the floor when sitting. o DO YOUR BEST to sleep in the bed at night. DO NOT sleep in your recliner. Long hours of sitting in a recliner leads to swelling of the legs and/or potential wounds on your backside. Off-Loading Wound #1 Right Toe Second o Open toe surgical shoe o Other: - KEEP PRESSURE OFF OF FOOT/WOUND Wound Treatment Wound #1 - Toe Second Wound Laterality: Right Cleanser: Normal Saline 1 x Per Day/30 Days Discharge Instructions: Wash your hands with soap and water. Remove old dressing, discard into plastic bag and place into trash. Cleanse the wound with Normal Saline prior to applying a clean dressing using gauze sponges, not tissues or cotton balls. Do not scrub or use excessive force. Pat dry using gauze sponges, not tissue or cotton balls. Primary Dressing: Xeroform 4x4-HBD (in/in) (Generic) 1 x Per Day/30 Days Discharge Instructions: Apply Xeroform 4x4-HBD (in/in) as directed Secondary Dressing: Conforming [place] Roll-Small (Generic) 1 x Per Day/30 Days Discharge Instructions: [place] as directed Secondary Dressing: Gauze 1 x Per Day/30 Days Secured With: 28M Medipore H Soft Cloth Surgical Tape, 2x2 (in/yd) (Generic) 1 x Per Day/30 Days Discharge Instructions: Secure conform Electronic Signature(s) Signed: 04/04/2021 4:40:41 PM By: Donnamarie Poag Signed: 04/05/2021 4:54:49 PM By: Worthy Keeler PA-C Entered By: Donnamarie Poag on 04/04/2021 14:32:47 Alex Boyle ([id number]) -------------------------------------------------------------------------------- Problem List Details Patient Name: Alex Boyle Date of Service: 04/04/2021 1:45 PM Medical Record Number: [id number] Patient Account Number: [id number] Date of Birth/Sex: Feb 06, 1946 (75 y.o. 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 M) Treating RN: Donnamarie Poag Primary Care Provider: Abelino Derrick Other Clinician: Referring Provider: Abelino Derrick Treating Provider/Extender: Skipper Cliche in Treatment: 10 Subjective Chief Complaint Information obtained from Patient Right 2nd Toe Ulcer History of Present Illness (HPI) 01/20/2021 upon evaluation today patient appears to be doing somewhat poorly in regard to a surgery that he had that was actually on 06/10/2020. He tells me that the toe was subsequently a hammertoe and this needed to be corrected by surgical intervention. When they got and the joint was so deteriorated and ended up having to actually pin and fuse the toe. This no longer Britta Mccreedy has given him some trouble he tells me. With that being said he has been seeing [facility], Dr. Sherryle Lis, and the only thing that is really been mentioned is  going to try to remove a portion of the toe in order  to basically it sounds like refuse this. Nonetheless I really do not know that the patient is interested in going down that road at this point based on what I am hearing. He does have noted culture on 12/22/2020 which was positive for Serratia. Nonetheless he has been on doxycycline though that just ended today. I would probably extend this for him due to the fact that I want him to be on this plenty in order to justify a good trial of antibiotics as well as wound care before potential for hyperbarics if he does indeed have osteomyelitis noted. The good news is his ABIs are on the right 1.38 with a TBI of 0.79 and on the left 1.21 with a TBI of 0.70. This was on 11/23/2020. Notably the patient is obviously very frustrated with the situation he has not had an MRI at this point since the surgery. I think that is really what we need to look into doing. Recently well8/22/2022 upon evaluation today patient appears to be doing regard to his toe ulcer. This is definitely not significantly improved compared to previous but is also not doing any worse to be honest. I did review his MRI today as well. With that being said I do not see any signs of definitive osteomyelitis currently. There was some mention of abnormal T1 and T2 signal intensity in the proximal and middle phalange ease of the second toe which could be due to recent surgery/stress reaction although osteomyelitis could not be excluded. Being that the bone at this location seems to be very solid there is no signs of flaking and in general the patient's toe does not appear to be showing signs of significant warmth with the erythema although there is some redness I think this is more just inflammatory as a result of the surgery. My gut feeling is that this does not represent osteomyelitis based on what I am seeing physically as well. The MRI also does not identify any signs of infection anywhere else  in the foot which is also good news. In general this is very reassuring in my opinion. All of this was discussed with the patient during the office visit today. 02/06/2021 upon evaluation today patient's toe actually appears to be doing decently well today. There is still bone noted in the base of the toe but in general I think we are making some progress and this seems to be closing in quite nicely. With that being said he is going require little bit of debridement today. Also did discuss with him that we got the results back from the reaching out to the insurance company about the [place] unfortunately that was denied as a not a covered service. In the meantime I think continuing with the collagen is probably the best way to go. 02/14/2021 upon evaluation today patient appears to be doing well with regard to his toe ulcer. He has been tolerating the dressing changes without complication. Fortunately I do think that though he is doing better and this looks much better no longer erythematous the doxycycline in that regard. Nonetheless I do believe that he is still having some issues here to be honest with some necrotic debris buildup and we are getting need to clean this away. He is in agreement with that plan. 02/20/2021 upon evaluation today patient's toe actually again showed signs of the collagen being somewhat dried into the wound trapping fluid underneath. I think this is actually more detrimental than beneficial. There is some additional granulation today which  is good news but in general I feel like the biggest issue is that we need to do what we can to try to prevent having ongoing and continued issues with this occurring. For that reason I think switching to [facility] Blue rope that we can put a small piece into the wound and then moisten would be the ideal thing to fill the space and prevent fluid trapping. 02/27/2021 upon evaluation today patient's toe ulcer actually showing some signs of  improvement. This is very slow to progress but nonetheless I do feel like he is showing signs of greater improvement especially compared to where we started is also really not showing any signs of infection which is also great news. Overall I am extremely pleased with where we stand today. 9/26; right second DIP. Initially a surgical wound to straighten a hammer deformity. Apparently this was done in December 2021. Still a small probing hole at 1 point this went down to bone. His wife reports she can no longer get Hydrofera Blue packing rope into the wound 10/11 right second DIP. Absolutely no improvement. This is a deep probing wound. It does not go to bone but there cannot be much tissue over this. This is just distal to the DIP itself. Last 2 weeks we have been using endoform 03/28/2021 upon evaluation today patient appears to be doing well with regard to his toe ulcer. The only thing is that the Iodoflex keeps drying up in the hole and then trapping fluid underneath as what was seen today. Nonetheless I do believe we did clean this out and then subsequently it may just be good for [place] to use like a Vaseline gauze such as Xeroform gauze to treat the area this should keep it moist without drying out and hopefully allow this to heal from the inside out the last little bit this wound is significantly better than what it was in the past. 04/04/2021 upon evaluation today patient appears to be doing decently well in regard to his toe ulcer. Very pleased with where things stand today. He does not appear to be any signs of active infection which is great news. No fevers, chills, nausea, vomiting, or diarrhea. CHRISTIEN, FRANKL ([id number]) Objective Constitutional Well-nourished and well-hydrated in no acute distress. Vitals Time Taken: 2:18 PM, Weight: 220 lbs, Temperature: 98.0 F, Pulse: 65 bpm, Respiratory Rate: 16 breaths/min, Blood Pressure: 109/65 mmHg. Respiratory normal breathing without  difficulty. Psychiatric this patient is able to make decisions and demonstrates good insight into disease process. Alert and Oriented x 3. pleasant and cooperative. General Notes: Upon inspection patient's wound bed showed signs of good granulation epithelization at this point. Fortunately there does not appear to be any signs of active infection systemically which is great news and overall I am extremely pleased with where things stand at this point. No fevers, chills, nausea, vomiting, or diarrhea. I did perform a very light debridement to clear away some of the necrotic debris this showed that he has a very tiny opening still in the middle of the wound but again this is significantly better even compared to last week and overall I think that is very close to complete resolution. Integumentary (Hair, Skin) Wound #1 status is Open. Original cause of wound was Surgical Injury. The date acquired was: 06/11/2020. The wound has been in treatment 10 weeks. The wound is located on the Right Toe Second. The wound measures 0.1cm length x 0.1cm width x 0.1cm depth; 0.008cm^2 area and 0.001cm^3 volume. There is  Fat Layer (Subcutaneous Tissue) exposed. There is no tunneling or undermining noted. There is a medium amount of serosanguineous drainage noted. There is small (1-33%) red, pink granulation within the wound bed. There is a large (67-100%) amount of necrotic tissue within the wound bed including Eschar and Adherent Slough. 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 There was a  Excisional Skin/Subcutaneous Tissue Debridement with a total area of 0.04 sq cm performed by Tommie Sams., PA-C. With the following instrument(s): Curette to remove Viable and Non-Viable tissue/material. Material removed includes Eschar, Subcutaneous Tissue, and Slough after achieving pain control using Lidocaine. A time out was conducted at 14:32, prior to the start of the procedure. There was no bleeding. The procedure was tolerated well. Post Debridement Measurements: 0.1cm length x 0.1cm width x 0.2cm depth; 0.002cm^3 volume. Character of Wound/Ulcer Post Debridement is improved. Post procedure Diagnosis Wound #1: Same as Pre-Procedure Plan Follow-up Appointments: Return Appointment in 1 week. Nurse Visit as needed Bathing/ Shower/ Hygiene: May shower; gently cleanse wound with antibacterial soap, rinse and pat dry prior to dressing wounds No tub bath. DAVARIS, YOUTSEY ([id number]) Edema Control - Lymphedema / Segmental Compressive Device / Other: Elevate leg(s) parallel to the floor when sitting. DO YOUR BEST to sleep in the bed at night. DO NOT sleep in your recliner. Long hours of sitting in a recliner leads to swelling of the legs and/or potential wounds on your backside. Off-Loading: Wound #1 Right Toe Second: Open toe surgical shoe Other: - KEEP PRESSURE OFF OF FOOT/WOUND WOUND #1: - Toe Second Wound Laterality: Right Cleanser: Normal Saline 1 x Per Day/30 Days Discharge Instructions: Wash your hands with soap and water. Remove old dressing, discard into plastic bag and place into trash. Cleanse the wound with Normal Saline prior to applying a clean dressing using gauze sponges, not tissues or cotton balls. Do not scrub or use excessive force. Pat dry using gauze sponges, not tissue or cotton balls. Primary Dressing: Xeroform 4x4-HBD (in/in) (Generic) 1 x Per Day/30 Days Discharge Instructions: Apply Xeroform 4x4-HBD (in/in) as directed Secondary Dressing: Conforming Guaze  Roll-Small (Generic) 1 x Per Day/30 Days Discharge Instructions: Apply Conforming Stretch Guaze Bandage as directed Secondary Dressing: Gauze 1 x Per Day/30 Days Secured With: 75M Medipore H Soft Cloth Surgical Tape, 2x2 (in/yd) (Generic) 1 x Per Day/30 Days Discharge Instructions: Secure conform 1. Would recommend currently that we going continue with the wound care measures as before and the patient is in agreement with the plan. This includes the use of the Xeroform gauze dressing which I think is doing a good job for him. 2. Also can recommend that we have the patient continue with the gauze to secure in place which I think is doing a good job. We will see him back for follow-up visit in 1 week. Electronic Signature(s) Signed: 04/04/2021 2:51:25 PM By: Worthy Keeler PA-C Entered By: Worthy Keeler on 04/04/2021 14:51:24 Alex Boyle ([id number]) -------------------------------------------------------------------------------- SuperBill Details Patient Name: Alex Boyle Date of Service: 04/04/2021 Medical Record Number: [id number] Patient Account Number: [id number] Date of Birth/Sex: 02/22/1946 (75 y.o. 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 I10 Essential (primary) hypertension I25.10 Atherosclerotic heart disease of native coronary artery without angina pectoris Facility Procedures CPT4 Code Description: 77373668 11042 - DEB SUBQ TISSUE 20 SQ CM/< Modifier: Quantity: 1 CPT4 Code Description: ICD-10 Diagnosis Description L97.516 Non-pressure chronic ulcer of other part of  right foot with bone involvement Modifier: without evidence Quantity: of necrosis Physician Procedures CPT4 Code Description: [id number] - WC PHYS SUBQ TISS 20 SQ CM Modifier: Quantity: 1 CPT4 Code Description: ICD-10 Diagnosis Description 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

## 2021-04-11 ENCOUNTER — Encounter: Payer: Federal, State, Local not specified - PPO | Attending: Physician Assistant | Admitting: Physician Assistant

## 2021-04-11 ENCOUNTER — Other Ambulatory Visit: Payer: Self-pay

## 2021-04-11 DIAGNOSIS — L97516 Non-pressure chronic ulcer of other part of right foot with bone involvement without evidence of necrosis: Secondary | ICD-10-CM | POA: Insufficient documentation

## 2021-04-11 DIAGNOSIS — I2510 Atherosclerotic heart disease of native coronary artery without angina pectoris: Secondary | ICD-10-CM | POA: Diagnosis not present

## 2021-04-11 DIAGNOSIS — I10 Essential (primary) hypertension: Secondary | ICD-10-CM | POA: Insufficient documentation

## 2021-04-11 DIAGNOSIS — T8131XA Disruption of external operation (surgical) wound, not elsewhere classified, initial encounter: Secondary | ICD-10-CM | POA: Diagnosis not present

## 2021-04-11 NOTE — Progress Notes (Addendum)
Alex, Boyle ([id number]) Visit Report for 04/11/2021 Arrival Information Details Patient Name: Alex Boyle, Alex Boyle. Date of Service: 04/11/2021 1:45 PM Medical Record Number: [id number] Patient Account Number: [id number] Date of Birth/Sex: 1945/08/16 (75 y.o. M) Treating RN: Donnamarie Poag Primary Care Polk Minor: Abelino Derrick Other Clinician: Referring Cylas Falzone: Abelino Derrick Treating Tierany Appleby/Extender: Skipper Cliche in Treatment: 11 Visit Information History Since Last Visit Added or deleted any medications: No Patient Arrived: Ambulatory Had a fall or experienced change in No Arrival Time: 13:50 activities of daily living that may affect Accompanied By: self risk of falls: Transfer Assistance: None Hospitalized since last visit: No Patient Requires Transmission-Based Precautions: No Has Dressing in Place as Prescribed: Yes Patient Has Alerts: No Pain Present Now: No Electronic Signature(s) Signed: 04/11/2021 2:28:05 PM By: Donnamarie Poag Entered By: Donnamarie Poag on 04/11/2021 13:52:33 Alex Boyle ([id number]) -------------------------------------------------------------------------------- Clinic Level of Care Assessment Details Patient Name: Alex Boyle Date of Service: 04/11/2021 1:45 PM Medical Record Number: [id number] Patient Account Number: [id number] Date of Birth/Sex: [birth year] (75 y.o. M) Treating RN: Donnamarie Poag Primary Care Molley Houser: Abelino Derrick Other Clinician: Referring Jordie Schreur: Abelino Derrick Treating Aprille Sawhney/Extender: Skipper Cliche in Treatment: 11 Clinic Level of Care Assessment Items TOOL 4 Quantity Score _0  - Use when only an EandM is performed on FOLLOW-UP visit 0 ASSESSMENTS - Nursing Assessment / Reassessment _1  - Reassessment of Co-morbidities (includes updates in patient status) 0 _2  - 0 Reassessment of Adherence to Treatment Plan ASSESSMENTS - Wound and Skin Assessment / Reassessment X - Simple Wound Assessment / Reassessment - one  wound 1 5 _3  - 0 Complex Wound Assessment / Reassessment - multiple wounds _4  - 0 Dermatologic / Skin Assessment (not related to wound area) ASSESSMENTS - Focused Assessment _5  - Circumferential Edema Measurements - multi extremities 0 _6  - 0 Nutritional Assessment / Counseling / Intervention _7  - 0 Lower Extremity Assessment (monofilament, tuning fork, pulses) _8  - 0 Peripheral Arterial Disease Assessment (using hand held doppler) ASSESSMENTS - Ostomy and/or Continence Assessment and Care _9  - Incontinence Assessment and Management 0 _10  - 0 Ostomy Care Assessment and Management (repouching, etc.) PROCESS - Coordination of Care X - Simple Patient / Family Education for ongoing care 1 15 _11  - 0 Complex (extensive) Patient / Family Education for ongoing care _12  - 0 Staff obtains Programmer, systems, Records, Test Results / Process Orders _13  - 0 Staff telephones HHA, Nursing Homes / Clarify orders / etc _14  - 0 Routine Transfer to another Facility (non-emergent condition) _15  - 0 Routine Hospital Admission (non-emergent condition) _16  - 0 New Admissions / Biomedical engineer / Ordering NPWT, Apligraf, etc. _17  - 0 Emergency Hospital Admission (emergent condition) X- 1 10 Simple Discharge Coordination _18  - 0 Complex (extensive) Discharge Coordination PROCESS - Special Needs _19  - Pediatric / Minor Patient Management 0 _20  - 0 Isolation Patient Management _21  - 0 Hearing / Language / Visual special needs _22  - 0 Assessment of Community assistance (transportation, D/C planning, etc.) _23  - 0 Additional assistance / Altered mentation _24  - 0 Support Surface(s) Assessment (bed, cushion, seat, etc.) INTERVENTIONS - Wound Cleansing / Measurement CONNERY, SHIFFLER ([id number]) X- 1 5 Simple Wound Cleansing - one wound _25  - 0 Complex Wound Cleansing - multiple wounds X- 1 5 Wound Imaging (photographs - any number of wounds) _26  - 0 Wound Tracing (instead of photographs) X- 1 5 Simple  Wound Measurement - one wound _27  - 0 Complex Wound Measurement - multiple wounds INTERVENTIONS - Wound Dressings X - Small  Wound Dressing one or multiple wounds 1 10 _0  - 0 Medium Wound Dressing one or multiple wounds _1  - 0 Large Wound Dressing one or multiple wounds X- 1 5 Application of Medications - topical <[id number]>_8<\/MVHQIONGEXBMWUXL>_2  - 0 Application of Medications - injection INTERVENTIONS - Miscellaneous _3  - External ear exam 0 _4  - 0 Specimen Collection (cultures, biopsies, blood, body fluids, etc.) _5  - 0 Specimen(s) / Culture(s) sent or taken to Lab for analysis _6  - 0 Patient Transfer (multiple staff / Harrel Lemon Lift / Similar devices) _7  - 0 Simple Staple / Suture removal (25 or less) _8  - 0 Complex Staple / Suture removal (26 or more) _9  - 0 Hypo / Hyperglycemic Management (close monitor of Blood Glucose) _10  - 0 Ankle / Brachial Index (ABI) - do not check if billed separately X- 1 5 Vital Signs Has the patient been seen at the hospital within the last three years: Yes Total Score: 65 Level Of Care: New/Established - Level 2 Electronic Signature(s) Signed: 04/11/2021 2:28:05 PM By: Donnamarie Poag Entered By: Donnamarie Poag on 04/11/2021 14:26:04 Alex Boyle ([id number]) -------------------------------------------------------------------------------- Encounter Discharge Information Details Patient Name: Alex Boyle Date of Service: 04/11/2021 1:45 PM Medical Record Number: [id number] Patient Account Number: [id number] Date of Birth/Sex: [birth year] (75 y.o. M) Treating RN: Donnamarie Poag Primary Care Greenley Martone: Abelino Derrick Other Clinician: Referring Jaydan Chretien: Abelino Derrick Treating Edilia Ghuman/Extender: Skipper Cliche in Treatment: 11 Encounter Discharge Information Items Discharge Condition: Stable Ambulatory Status: Ambulatory Discharge Destination: Home Transportation: Private Auto Accompanied By: self Schedule Follow-up Appointment: Yes Clinical Summary of Care: Electronic  Signature(s) Signed: 04/11/2021 2:28:05 PM By: Donnamarie Poag Entered By: Donnamarie Poag on 04/11/2021 14:27:34 Alex Boyle ([id number]) -------------------------------------------------------------------------------- Lower Extremity Assessment Details Patient Name: Alex Boyle Date of Service: 04/11/2021 1:45 PM Medical Record Number: [id number] Patient Account Number: [id number] Date of Birth/Sex: 1945-10-01 (75 y.o. M) Treating RN: Donnamarie Poag Primary Care Dawson Albers: Abelino Derrick Other Clinician: Referring Keoki Mchargue: Abelino Derrick Treating Kynli Chou/Extender: Jeri Cos Weeks in Treatment: 11 Edema Assessment Assessed: [Left: No] Patrice Paradise: Yes] Edema: [Left: N] [Right: o] Vascular Assessment Pulses: Dorsalis Pedis Palpable: [Right:Yes] Electronic Signature(s) Signed: 04/11/2021 2:28:05 PM By: Donnamarie Poag Entered By: Donnamarie Poag on 04/11/2021 13:58:45 Alex Boyle ([id number]) -------------------------------------------------------------------------------- Multi Wound Chart Details Patient Name: Alex Boyle Date of Service: 04/11/2021 1:45 PM Medical Record Number: [id number] Patient Account Number: [id number] Date of Birth/Sex: March 09, 1946 (75 y.o. M) Treating RN: Donnamarie Poag Primary Care Dortha Neighbors: Abelino Derrick Other Clinician: Referring Annamarie Yamaguchi: Abelino Derrick Treating Jyl Chico/Extender: Skipper Cliche in Treatment: 11 Vital Signs Height(in): Pulse(bpm): 67 Weight(lbs): 220 Blood Pressure(mmHg): 132/71 Body Mass Index(BMI): Temperature(F): 98.2 Respiratory Rate(breaths/min): 16 Photos: [N/A:N/A] Wound Location: Right Toe Second N/A N/A Wounding Event: Surgical Injury N/A N/A Primary Etiology: Open Surgical Wound N/A N/A Comorbid History: Asthma, Sleep Apnea, N/A N/A Hypertension, Myocardial Infarction, Neuropathy Date Acquired: 06/11/2020 N/A N/A Weeks of Treatment: 11 N/A N/A Wound Status: Open N/A N/A Pending Amputation on Yes N/A  N/A Presentation: Measurements L x W x D (cm) 0.1x0.1x0.1 N/A N/A Area (cm) : 0.008 N/A N/A Volume (cm) : 0.001 N/A N/A % Reduction in Area: 95.90% N/A N/A % Reduction in Volume: 98.70% N/A N/A Classification: Full Thickness With Exposed N/A N/A Support Structures Exudate Amount: Medium N/A N/A Exudate Type: Serosanguineous N/A N/A Exudate Color: red, brown N/A N/A Granulation Amount: None Present (0%) N/A N/A Necrotic Amount: Large (67-100%) N/A N/A Necrotic Tissue: Eschar N/A N/A Exposed Structures: Fat Layer (Subcutaneous Tissue): N/A N/A Yes  Fascia: No Tendon: No Muscle: No Joint: No Bone: No Epithelialization: Small (1-33%) N/A N/A Treatment Notes Electronic Signature(s) Signed: 04/11/2021 2:28:05 PM By: Donnamarie Poag Entered By: Donnamarie Poag on 04/11/2021 13:59:17 Alex Boyle ([id number]) -------------------------------------------------------------------------------- [place] Details Patient Name: Alex Boyle Date of Service: 04/11/2021 1:45 PM Medical Record Number: [id number] Patient Account Number: [id number] Date of Birth/Sex: 1945-12-11 (75 y.o. M) Treating RN: Donnamarie Poag Primary Care Marializ Ferrebee: Abelino Derrick Other Clinician: Referring Alejandro Gamel: Abelino Derrick Treating Iantha Titsworth/Extender: Skipper Cliche in Treatment: 11 Active Inactive Wound/Skin Impairment Nursing Diagnoses: Impaired tissue integrity Goals: Patient/caregiver will verbalize understanding of skin care regimen Date Initiated: 01/20/2021 Date Inactivated: 02/14/2021 Target Resolution Date: 01/20/2021 Goal Status: Met Ulcer/skin breakdown will have a volume reduction of 30% by week 4 Date Initiated: 01/20/2021 Date Inactivated: 04/04/2021 Target Resolution Date: 02/20/2021 Goal Status: Met Ulcer/skin breakdown will have a volume reduction of 50% by week 8 Date Initiated: 01/20/2021 Date Inactivated: 04/11/2021 Target Resolution Date: 03/22/2021 Goal Status:  Met Ulcer/skin breakdown will have a volume reduction of 80% by week 12 Date Initiated: 01/20/2021 Target Resolution Date: 04/22/2021 Goal Status: Active Ulcer/skin breakdown will heal within 14 weeks Date Initiated: 01/20/2021 Target Resolution Date: 05/22/2021 Goal Status: Active Interventions: Assess patient/caregiver ability to obtain necessary supplies Assess patient/caregiver ability to perform ulcer/skin care regimen upon admission and as needed Assess ulceration(s) every visit Provide education on ulcer and skin care Treatment Activities: Skin care regimen initiated : 01/20/2021 Notes: Electronic Signature(s) Signed: 04/11/2021 2:28:05 PM By: Donnamarie Poag Entered By: Donnamarie Poag on 04/11/2021 13:59:00 Alex Boyle ([id number]) -------------------------------------------------------------------------------- Pain Assessment Details Patient Name: Alex Boyle Date of Service: 04/11/2021 1:45 PM Medical Record Number: [id number] Patient Account Number: [id number] Date of Birth/Sex: 1946-04-24 (75 y.o. M) Treating RN: Donnamarie Poag Primary Care Azan Maneri: Abelino Derrick Other Clinician: Referring Destry Bezdek: Abelino Derrick Treating Valina Maes/Extender: Skipper Cliche in Treatment: 11 Active Problems Location of Pain Severity and Description of Pain Patient Has Paino No Site Locations Rate the pain. Current Pain Level: 0 Pain Management and Medication Current Pain Management: Electronic Signature(s) Signed: 04/11/2021 2:28:05 PM By: Donnamarie Poag Entered By: Donnamarie Poag on 04/11/2021 13:54:30 Alex Boyle ([id number]) -------------------------------------------------------------------------------- Patient/Caregiver Education Details Patient Name: Alex Boyle Date of Service: 04/11/2021 1:45 PM Medical Record Number: [id number] Patient Account Number: [id number] Date of Birth/Gender: [birth year] (75 y.o. M) Treating RN: Donnamarie Poag Primary Care Physician: Abelino Derrick  Other Clinician: Referring Physician: Abelino Derrick Treating Physician/Extender: Skipper Cliche in Treatment: 11 Education Assessment Education Provided To: Patient Education Topics Provided Basic Hygiene: Wound/Skin Impairment: Electronic Signature(s) Signed: 04/11/2021 2:28:05 PM By: Donnamarie Poag Entered By: Donnamarie Poag on 04/11/2021 14:26:30 Alex Boyle ([id number]) -------------------------------------------------------------------------------- Wound Assessment Details Patient Name: Alex Boyle Date of Service: 04/11/2021 1:45 PM Medical Record Number: [id number] Patient Account Number: [id number] Date of Birth/Sex: 1946/05/10 (75 y.o. M) Treating RN: Donnamarie Poag Primary Care Danaysia Rader: Abelino Derrick Other Clinician: Referring Taimi Towe: Abelino Derrick Treating Reon Hunley/Extender: Skipper Cliche in Treatment: 11 Wound Status Wound Number: 1 Primary Open Surgical Wound Etiology: Wound Location: Right Toe Second Wound Status: Open Wounding Event: Surgical Injury Comorbid Asthma, Sleep Apnea, Hypertension, Myocardial Date Acquired: 06/11/2020 History: Infarction, Neuropathy Weeks Of Treatment: 11 Clustered Wound: No Pending Amputation On Presentation Photos Wound Measurements Length: (cm) 0.1 Width: (cm) 0.1 Depth: (cm) 0.1 Area: (cm) 0.008 Volume: (cm) 0.001 % Reduction in Area: 95.9% % Reduction in Volume: 98.7% Epithelialization: Large (67-100%) Tunneling: No Undermining: No Wound Description Classification: Full  Thickness With Exposed Support Structures Exudate Amount: None Present Foul Odor After Cleansing: No Slough/Fibrino Yes Wound Bed Granulation Amount: None Present (0%) Exposed Structure Necrotic Amount: Large (67-100%) Fascia Exposed: No Fat Layer (Subcutaneous Tissue) Exposed: No Tendon Exposed: No Muscle Exposed: No Joint Exposed: No Bone Exposed: No Treatment Notes Wound #1 (Toe Second) Wound Laterality: Right Cleanser Normal  Saline Discharge Instruction: Wash your hands with soap and water. Remove old dressing, discard into plastic bag and place into trash. Cleanse the wound with Normal Saline prior to applying a clean dressing using gauze sponges, not tissues or cotton balls. Do not scrub or use excessive force. Pat dry using gauze sponges, not tissue or cotton balls. JAVONI, LUCKEN ([id number]) Peri-Wound Care Topical Primary Dressing Xeroform 4x4-HBD (in/in) Discharge Instruction: Apply Xeroform 4x4-HBD (in/in) as directed Secondary Dressing [place] Roll-Small Discharge Instruction: Apply Conforming Stretch Guaze Bandage as directed Gauze Secured With 43M Medipore H Soft Cloth Surgical Tape, 2x2 (in/yd) Discharge Instruction: Secure conform Compression Wrap Compression Stockings Add-Ons Electronic Signature(s) Signed: 04/11/2021 2:28:05 PM By: Donnamarie Poag Entered By: Donnamarie Poag on 04/11/2021 14:23:43 Alex Boyle ([id number]) -------------------------------------------------------------------------------- [place] Details Patient Name: Alex Boyle Date of Service: 04/11/2021 1:45 PM Medical Record Number: [id number] Patient Account Number: [id number] Date of Birth/Sex: 08/02/1945 (75 y.o. M) Treating RN: Donnamarie Poag Primary Care Zachari Alberta: Abelino Derrick Other Clinician: Referring Kionna Brier: Abelino Derrick Treating Reena Borromeo/Extender: Skipper Cliche in Treatment: 11 Vital Signs Time Taken: 13:53 Temperature (F): 98.2 Weight (lbs): 220 Pulse (bpm): 69 Respiratory Rate (breaths/min): 16 Blood Pressure (mmHg): 132/71 Reference Range: 80 - 120 mg / dl Electronic Signature(s) Signed: 04/11/2021 2:28:05 PM By: Donnamarie Poag Entered ByDonnamarie Poag on 04/11/2021 13:54:18

## 2021-04-12 DIAGNOSIS — M62838 Other muscle spasm: Secondary | ICD-10-CM | POA: Diagnosis not present

## 2021-04-12 DIAGNOSIS — R3915 Urgency of urination: Secondary | ICD-10-CM | POA: Diagnosis not present

## 2021-04-12 DIAGNOSIS — M6281 Muscle weakness (generalized): Secondary | ICD-10-CM | POA: Diagnosis not present

## 2021-04-12 DIAGNOSIS — M6289 Other specified disorders of muscle: Secondary | ICD-10-CM | POA: Diagnosis not present

## 2021-04-13 NOTE — Progress Notes (Signed)
Alex Boyle, Alex Boyle ([id number]) Visit Report for 04/11/2021 Chief Complaint Document Details Patient Name: Alex Boyle, Alex Boyle. Date of Service: 04/11/2021 1:45 PM Medical Record Number: [id number] Patient Account Number: [id number] Date of Birth/Sex: [birth year] (75 y.o. M) Treating RN: Donnamarie Poag Primary Care Provider: Abelino Derrick Other Clinician: Referring Provider: Abelino Derrick Treating Provider/Extender: Skipper Cliche in Treatment: 11 Information Obtained from: Patient Chief Complaint Right 2nd Toe Ulcer Electronic Signature(s) Signed: 04/12/2021 6:48:42 PM By: Worthy Keeler PA-C Entered By: Worthy Keeler on 04/12/2021 18:48:42 Alex Boyle ([id number]) -------------------------------------------------------------------------------- HPI Details Patient Name: Alex Boyle Date of Service: 04/11/2021 1:45 PM Medical Record Number: [id number] Patient Account Number: [id number] Date of Birth/Sex: 06-06-1946 (75 y.o. M) Treating RN: Donnamarie Poag Primary Care Provider: Abelino Derrick Other Clinician: Referring Provider: Abelino Derrick Treating Provider/Extender: Skipper Cliche in Treatment: 11 History of Present Illness HPI Description: 01/20/2021 upon evaluation today patient appears to be doing somewhat poorly in regard to a surgery that he had that was actually on 06/10/2020. He tells me that the toe was subsequently a hammertoe and this needed to be corrected by surgical intervention. When they got and the joint was so deteriorated and ended up having to actually pin and fuse the toe. This no longer Britta Mccreedy has given him some trouble he tells me. With that being said he has been seeing [facility], Dr. Sherryle Lis, and the only thing that is really been mentioned is going to try to remove a portion of the toe in order to basically it sounds like refuse this. Nonetheless I really do not know that the patient is interested in going down that road at this point based on  what I am hearing. He does have noted culture on 12/22/2020 which was positive for Serratia. Nonetheless he has been on doxycycline though that just ended today. I would probably extend this for him due to the fact that I want him to be on this plenty in order to justify a good trial of antibiotics as well as wound care before potential for hyperbarics if he does indeed have osteomyelitis noted. The good news is his ABIs are on the right 1.38 with a TBI of 0.79 and on the left 1.21 with a TBI of 0.70. This was on 11/23/2020. Notably the patient is obviously very frustrated with the situation he has not had an MRI at this point since the surgery. I think that is really what we need to look into doing. Recently well8/22/2022 upon evaluation today patient appears to be doing regard to his toe ulcer. This is definitely not significantly improved compared to previous but is also not doing any worse to be honest. I did review his MRI today as well. With that being said I do not see any signs of definitive osteomyelitis currently. There was some mention of abnormal T1 and T2 signal intensity in the proximal and middle phalange ease of the second toe which could be due to recent surgery/stress reaction although osteomyelitis could not be excluded. Being that the bone at this location seems to be very solid there is no signs of flaking and in general the patient's toe does not appear to be showing signs of significant warmth with the erythema although there is some redness I think this is more just inflammatory as a result of the surgery. My gut feeling is that this does not represent osteomyelitis based on what I am seeing physically as well. The MRI also does not identify any  signs of infection anywhere else in the foot which is also good news. In general this is very reassuring in my opinion. All of this was discussed with the patient during the office visit today. 02/06/2021 upon evaluation today patient's  toe actually appears to be doing decently well today. There is still bone noted in the base of the toe but in general I think we are making some progress and this seems to be closing in quite nicely. With that being said he is going require little bit of debridement today. Also did discuss with him that we got the results back from the reaching out to the insurance company about the [place] unfortunately that was denied as a not a covered service. In the meantime I think continuing with the collagen is probably the best way to go. 02/14/2021 upon evaluation today patient appears to be doing well with regard to his toe ulcer. He has been tolerating the dressing changes without complication. Fortunately I do think that though he is doing better and this looks much better no longer erythematous the doxycycline in that regard. Nonetheless I do believe that he is still having some issues here to be honest with some necrotic debris buildup and we are getting need to clean this away. He is in agreement with that plan. 02/20/2021 upon evaluation today patient's toe actually again showed signs of the collagen being somewhat dried into the wound trapping fluid underneath. I think this is actually more detrimental than beneficial. There is some additional granulation today which is good news but in general I feel like the biggest issue is that we need to do what we can to try to prevent having ongoing and continued issues with this occurring. For that reason I think switching to [facility] Blue rope that we can put a small piece into the wound and then moisten would be the ideal thing to fill the space and prevent fluid trapping. 02/27/2021 upon evaluation today patient's toe ulcer actually showing some signs of improvement. This is very slow to progress but nonetheless I do feel like he is showing signs of greater improvement especially compared to where we started is also really not showing any signs of  infection which is also great news. Overall I am extremely pleased with where we stand today. 9/26; right second DIP. Initially a surgical wound to straighten a hammer deformity. Apparently this was done in December 2021. Still a small probing hole at 1 point this went down to bone. His wife reports she can no longer get Hydrofera Blue packing rope into the wound 10/11 right second DIP. Absolutely no improvement. This is a deep probing wound. It does not go to bone but there cannot be much tissue over this. This is just distal to the DIP itself. Last 2 weeks we have been using endoform 03/28/2021 upon evaluation today patient appears to be doing well with regard to his toe ulcer. The only thing is that the Iodoflex keeps drying up in the hole and then trapping fluid underneath as what was seen today. Nonetheless I do believe we did clean this out and then subsequently it may just be good for [place] to use like a Vaseline gauze such as Xeroform gauze to treat the area this should keep it moist without drying out and hopefully allow this to heal from the inside out the last little bit this wound is significantly better than what it was in the past. 04/04/2021 upon evaluation today patient appears to be  doing decently well in regard to his toe ulcer. Very pleased with where things stand today. He does not appear to be any signs of active infection which is great news. No fevers, chills, nausea, vomiting, or diarrhea. 04/11/2021 upon evaluation today patient actually appears to be doing excellent in regard to his toe ulcer. He in general has been showing signs of improvement and overall I feel like that he is very close to complete resolution. I am extremely pleased with where we stand today. No fevers, chills, nausea, vomiting, or diarrhea. Electronic Signature(s) Signed: 04/12/2021 6:49:02 PM By: Worthy Keeler PA-C Entered By: Worthy Keeler on 04/12/2021 18:49:02 Spens[place], Harren Angus Seller ([id number]) Alex Boyle ([id number]) -------------------------------------------------------------------------------- Physical Exam Details Patient Name: Alex Boyle Date of Service: 04/11/2021 1:45 PM Medical Record Number: [id number] Patient Account Number: [id number] Date of Birth/Sex: [birth year] (75 y.o. M) Treating RN: Donnamarie Poag Primary Care Provider: Abelino Derrick Other Clinician: Referring Provider: Abelino Derrick Treating Provider/Extender: Skipper Cliche in Treatment: 6 Constitutional Well-nourished and well-hydrated in no acute distress. Respiratory normal breathing without difficulty. Psychiatric this patient is able to make decisions and demonstrates good insight into disease process. Alert and Oriented x 3. pleasant and cooperative. Notes Patient's wound bed showed signs of good granulation and epithelization in fact I am not even certain there is really an opening here at all which is even better. Nonetheless I do believe that one of the main issues that were having at this time is simply making sure that this has completely filled in from the bottom outward. We want to ensure her overall that this is completely closed before I release him I do not want anything relapsing on him. Electronic Signature(s) Signed: 04/12/2021 6:49:33 PM By: Worthy Keeler PA-C Entered By: Worthy Keeler on 04/12/2021 18:49:33 Alex Boyle ([id number]) -------------------------------------------------------------------------------- Physician Orders Details Patient Name: Alex Boyle Date of Service: 04/11/2021 1:45 PM Medical Record Number: [id number] Patient Account Number: [id number] Date of Birth/Sex: 02-Aug-1945 (75 y.o. M) Treating RN: Donnamarie Poag Primary Care Provider: Abelino Derrick Other Clinician: Referring Provider: Abelino Derrick Treating Provider/Extender: Skipper Cliche in Treatment: 11 Verbal / Phone Orders: No Diagnosis Coding Follow-up Appointments o Return  Appointment in 1 week. o Nurse Visit as needed Bathing/ Shower/ Hygiene o May shower; gently cleanse wound with antibacterial soap, rinse and pat dry prior to dressing wounds o No tub bath. Edema Control - Lymphedema / Segmental Compressive Device / Other o Elevate leg(s) parallel to the floor when sitting. o DO YOUR BEST to sleep in the bed at night. DO NOT sleep in your recliner. Long hours of sitting in a recliner leads to swelling of the legs and/or potential wounds on your backside. Off-Loading Wound #1 Right Toe Second o Open toe surgical shoe o Other: - KEEP PRESSURE OFF OF FOOT/WOUND Additional Orders / Instructions o Follow Nutritious Diet and Increase Protein Intake Wound Treatment Wound #1 - Toe Second Wound Laterality: Right Cleanser: Normal Saline Every Other Day/30 Days Discharge Instructions: Wash your hands with soap and water. Remove old dressing, discard into plastic bag and place into trash. Cleanse the wound with Normal Saline prior to applying a clean dressing using gauze sponges, not tissues or cotton balls. Do not scrub or use excessive force. Pat dry using gauze sponges, not tissue or cotton balls. Primary Dressing: Xeroform 4x4-HBD (in/in) (Generic) Every Other Day/30 Days Discharge Instructions: Apply Xeroform 4x4-HBD (in/in) as directed Secondary Dressing: Conforming Guaze Roll-Small (Generic) Every Other  Day/30 Days Discharge Instructions: Apply Conforming Stretch Guaze Bandage as directed Secondary Dressing: Gauze Every Other Day/30 Days Secured With: 87M Intercourse Surgical Tape, 2x2 (in/yd) (Generic) Every Other Day/30 Days Discharge Instructions: Secure conform Electronic Signature(s) Signed: 04/11/2021 2:28:05 PM By: Donnamarie Poag Signed: 04/12/2021 6:58:05 PM By: Worthy Keeler PA-C Entered By: Donnamarie Poag on 04/11/2021 14:24:53 Alex Boyle  ([id number]) -------------------------------------------------------------------------------- Problem List Details Patient Name: Alex Boyle Date of Service: 04/11/2021 1:45 PM Medical Record Number: [id number] Patient Account Number: [id number] Date of Birth/Sex: [birth year] (75 y.o. 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 M) Treating RN: Donnamarie Poag Primary Care Provider: Abelino Derrick Other Clinician: Referring Provider: Abelino Derrick Treating Provider/Extender: Skipper Cliche in Treatment: 11 Subjective Chief Complaint Information obtained from Patient Right 2nd Toe Ulcer History of Present Illness (HPI) 01/20/2021 upon evaluation today patient appears to be doing somewhat poorly in regard to a surgery that he had that was actually on 06/10/2020. He tells me that the toe was  subsequently a hammertoe and this needed to be corrected by surgical intervention. When they got and the joint was so deteriorated and ended up having to actually pin and fuse the toe. This no longer Britta Mccreedy has given him some trouble he tells me. With that being said he has been seeing [facility], Dr. Sherryle Lis, and the only thing that is really been mentioned is going to try to remove a portion of the toe in order to basically it sounds like refuse this. Nonetheless I really do not know that the patient is interested in going down that road at this point based on what I am hearing. He does have noted culture on 12/22/2020 which was positive for Serratia. Nonetheless he has been on doxycycline though that just ended today. I would probably extend this for him due to the fact that I want him to be on this plenty in order to justify a good trial of antibiotics as well as wound care before potential for hyperbarics if he does indeed have osteomyelitis noted. The good news is his ABIs are on the right 1.38 with a TBI of 0.79 and on the left 1.21 with a TBI of 0.70. This was on 11/23/2020. Notably the patient is obviously very frustrated with the situation he has not had an MRI at this point since the surgery. I think that is really what we need to look into doing. Recently well8/22/2022 upon evaluation today patient appears to be doing regard to his toe ulcer. This is definitely not significantly improved compared to previous but is also not doing any worse to be honest. I did review his MRI today as well.  With that being said I do not see any signs of definitive osteomyelitis currently. There was some mention of abnormal T1 and T2 signal intensity in the proximal and middle phalange ease of the second toe which could be due to recent surgery/stress reaction although osteomyelitis could not be excluded. Being that the bone at this location seems to be very solid there is no signs of flaking and in  general the patient's toe does not appear to be showing signs of significant warmth with the erythema although there is some redness I think this is more just inflammatory as a result of the surgery. My gut feeling is that this does not represent osteomyelitis based on what I am seeing physically as well. The MRI also does not identify any signs of infection anywhere else in the foot which is also good news. In general this is very reassuring in my opinion. All of this was discussed with the patient during the office visit today. 02/06/2021 upon evaluation today patient's toe actually appears to be doing decently well today. There is still bone noted in the base of the toe but in general I think we are making some progress and this seems to be closing in quite nicely. With that being said he is going require little bit of debridement today. Also did discuss with him that we got the results back from the reaching out to the insurance company about the [place] unfortunately that was denied as a not a covered service. In the meantime I think continuing with the collagen is probably the best way to go. 02/14/2021 upon evaluation today patient appears to be doing well with regard to his toe ulcer. He has been tolerating the dressing changes without complication. Fortunately I do think that though he is doing better and this looks much better no longer erythematous the doxycycline in that regard. Nonetheless I do believe that he is still having some issues here to be honest with some necrotic debris buildup and we are getting need to clean this away. He is in agreement with that plan. 02/20/2021 upon evaluation today patient's toe actually again showed signs of the collagen being somewhat dried into the wound trapping fluid underneath. I think this is actually more detrimental than beneficial. There is some additional granulation today which is good news but in general I feel like the biggest issue is that we  need to do what we can to try to prevent having ongoing and continued issues with this occurring. For that reason I think switching to [facility] Blue rope that we can put a small piece into the wound and then moisten would be the ideal thing to fill the space and prevent fluid trapping. 02/27/2021 upon evaluation today patient's toe ulcer actually showing some signs of improvement. This is very slow to progress but nonetheless I do feel like he is showing signs of greater improvement especially compared to where we started is also really not showing any signs of infection which is also great news. Overall I am extremely pleased with where we stand today. 9/26; right second DIP. Initially a surgical wound to straighten a hammer deformity. Apparently this was done in December 2021. Still a small probing hole at 1 point this went down to bone. His wife reports she can no longer get Hydrofera Blue packing rope into the wound 10/11 right second DIP. Absolutely no improvement. This is a deep probing wound. It does not go to bone but there cannot be much tissue over  this. This is just distal to the DIP itself. Last 2 weeks we have been using endoform 03/28/2021 upon evaluation today patient appears to be doing well with regard to his toe ulcer. The only thing is that the Iodoflex keeps drying up in the hole and then trapping fluid underneath as what was seen today. Nonetheless I do believe we did clean this out and then subsequently it may just be good for [place] to use like a Vaseline gauze such as Xeroform gauze to treat the area this should keep it moist without drying out and hopefully allow this to heal from the inside out the last little bit this wound is significantly better than what it was in the past. 04/04/2021 upon evaluation today patient appears to be doing decently well in regard to his toe ulcer. Very pleased with where things stand today. He does not appear to be any signs of active infection  which is great news. No fevers, chills, nausea, vomiting, or diarrhea. 04/11/2021 upon evaluation today patient actually appears to be doing excellent in regard to his toe ulcer. He in general has been showing signs of improvement and overall I feel like that he is very close to complete resolution. I am extremely pleased with where we stand today. No fevers, chills, nausea, vomiting, or diarrhea. Alex Boyle, Alex Boyle ([id number]) Objective Constitutional Well-nourished and well-hydrated in no acute distress. Vitals Time Taken: 1:53 PM, Weight: 220 lbs, Temperature: 98.2 F, Pulse: 69 bpm, Respiratory Rate: 16 breaths/min, Blood Pressure: 132/71 mmHg. Respiratory normal breathing without difficulty. Psychiatric this patient is able to make decisions and demonstrates good insight into disease process. Alert and Oriented x 3. pleasant and cooperative. General Notes: Patient's wound bed showed signs of good granulation and epithelization in fact I am not even certain there is really an opening here at all which is even better. Nonetheless I do believe that one of the main issues that were having at this time is simply making sure that this has completely filled in from the bottom outward. We want to ensure her overall that this is completely closed before I release him I do not want anything relapsing on him. Integumentary (Hair, Skin) Wound #1 status is Open. Original cause of wound was Surgical Injury. The date acquired was: 06/11/2020. The wound has been in treatment 11 weeks. The wound is located on the Right Toe Second. The wound measures 0.1cm length x 0.1cm width x 0.1cm depth; 0.008cm^2 area and 0.001cm^3 volume. There is no tunneling or undermining noted. There is a none present amount of drainage noted. There is no granulation within the wound bed. There is a large (67-100%) amount of necrotic tissue within the wound bed. 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 Nurse Visit as needed Bathing/ Shower/ Hygiene: May shower; gently cleanse wound with antibacterial soap, rinse and pat dry prior to dressing wounds No tub bath. Edema Control - Lymphedema / Segmental Compressive Device / Other: Elevate leg(s) parallel to the floor when sitting. DO YOUR BEST to sleep in the bed at night. DO NOT sleep in your recliner. Long hours of sitting in a recliner leads to swelling of the legs and/or potential wounds on your backside. Off-Loading: Wound #1 Right Toe Second:  Open toe surgical shoe Other: - KEEP PRESSURE OFF OF FOOT/WOUND Additional Orders / Instructions: Follow Nutritious Diet and Increase Protein Intake WOUND #1: - Toe Second Wound Laterality: Right Cleanser: Normal Saline Every Other Day/30 Days Discharge Instructions: Wash your hands with soap and water. Remove old dressing, discard into plastic bag and place into trash. Cleanse the wound with Normal Saline prior to applying a clean dressing using gauze sponges, not tissues or cotton balls. Do not scrub or use excessive force. Pat dry using gauze sponges, not tissue or cotton balls. Alex Boyle, Alex Boyle ([id number]) Primary Dressing: Xeroform 4x4-HBD (in/in) (Generic) Every Other Day/30 Days Discharge Instructions: Apply Xeroform 4x4-HBD (in/in) as directed Secondary Dressing: Conforming Guaze Roll-Small (Generic) Every Other Day/30 Days Discharge Instructions: Cope as directed Secondary Dressing: Gauze Every Other Day/30 Days Secured With: 62M Medipore H Soft Cloth Surgical Tape, 2x2 (in/yd) (Generic) Every Other Day/30  Days Discharge Instructions: Secure conform 1. Would recommend currently that we actually go ahead and monitor this for 1 more week there may be just a tiny tiny area that is open in the center part of the wound but nothing that even had to be debrided away or cleaned out overall I think this has done extremely well. I would recommend Xeroform to keep this from drying out hopefully this will allow the last little bit to heal up and be done. He is in agreement with the plan. 2. Also can recommend that he continue to protect the area he is done a great job with this and I am hopeful that we will continue to be the case. We will see patient back for reevaluation in 1 week here in the clinic. If anything worsens or changes patient will contact our office for additional recommendations. Electronic Signature(s) Signed: 04/12/2021 6:50:09 PM By: Worthy Keeler PA-C Entered By: Worthy Keeler on 04/12/2021 18:50:08 Alex Boyle ([id number]) -------------------------------------------------------------------------------- SuperBill Details Patient Name: Alex Boyle Date of Service: 04/11/2021 Medical Record Number: [id number] Patient Account Number: [id number] Date of Birth/Sex: 21-Nov-1945 (75 y.o. 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 I10 Essential (primary) hypertension I25.10 Atherosclerotic heart disease of native coronary artery without angina pectoris Facility Procedures CPT4 Code: 36468032 Description: [phone] - WOUND CARE VISIT-LEV 2 EST PT Modifier: Quantity: 1 Physician  Procedures CPT4 Code Description: [id number] [id number] - WC PHYS LEVEL 3 - EST PT Modifier: Quantity: 1 CPT4 Code Description: ICD-10 Diagnosis Description T81.31XA Disruption of external operation (surgical) wound, not elsewhere classified 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

## 2021-04-18 ENCOUNTER — Encounter: Payer: Federal, State, Local not specified - PPO | Admitting: Physician Assistant

## 2021-04-25 ENCOUNTER — Other Ambulatory Visit: Payer: Self-pay

## 2021-04-25 ENCOUNTER — Encounter: Payer: Federal, State, Local not specified - PPO | Admitting: Physician Assistant

## 2021-04-25 DIAGNOSIS — I2510 Atherosclerotic heart disease of native coronary artery without angina pectoris: Secondary | ICD-10-CM | POA: Diagnosis not present

## 2021-04-25 DIAGNOSIS — T8131XA Disruption of external operation (surgical) wound, not elsewhere classified, initial encounter: Secondary | ICD-10-CM | POA: Diagnosis not present

## 2021-04-25 DIAGNOSIS — L97516 Non-pressure chronic ulcer of other part of right foot with bone involvement without evidence of necrosis: Secondary | ICD-10-CM | POA: Diagnosis not present

## 2021-04-25 DIAGNOSIS — I10 Essential (primary) hypertension: Secondary | ICD-10-CM | POA: Diagnosis not present

## 2021-04-25 NOTE — Progress Notes (Signed)
THAISON, KOLODZIEJSKI ([id number]) Visit Report for 04/25/2021 Chief Complaint Document Details Patient Name: Alex Boyle, Alex Boyle. Date of Service: 04/25/2021 1:45 PM Medical Record Number: [id number] Patient Account Number: [id number] Date of Birth/Sex: 03/24/1946 (75 y.o. M) Treating RN: Donnamarie Poag Primary Care Provider: Abelino Derrick Other Clinician: Referring Provider: Abelino Derrick Treating Provider/Extender: Skipper Cliche in Treatment: 13 Information Obtained from: Patient Chief Complaint Right 2nd Toe Ulcer Electronic Signature(s) Signed: 04/25/2021 1:57:25 PM By: Worthy Keeler PA-C Entered By: Worthy Keeler on 04/25/2021 13:57:24 [name] ([id number]) -------------------------------------------------------------------------------- Problem List Details Patient Name: [name] Date of Service: 04/25/2021 1:45 PM Medical Record Number: [id number] Patient Account Number: [id number] Date of Birth/Sex: 05/11/1946 (75 y.o. 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

## 2021-04-25 NOTE — Progress Notes (Addendum)
ARPAN, ESKELSON ([id number]) Visit Report for 04/25/2021 Arrival Information Details Patient Name: Alex Boyle, Alex Boyle. Date of Service: 04/25/2021 1:45 PM Medical Record Number: [id number] Patient Account Number: [id number] Date of Birth/Sex: 1946/06/07 (75 y.o. M) Treating RN: Donnamarie Poag Primary Care Crysten Kaman: Abelino Derrick Other Clinician: Referring Elexia Friedt: Abelino Derrick Treating Pheonix Wisby/Extender: Skipper Cliche in Treatment: 108 Visit Information History Since Last Visit Added or deleted any medications: No Patient Arrived: Ambulatory Had a fall or experienced change in No Arrival Time: 13:57 activities of daily living that may affect Accompanied By: self risk of falls: Transfer Assistance: None Hospitalized since last visit: No Patient Identification Verified: Yes Has Dressing in Place as Prescribed: Yes Secondary Verification Process Completed: Yes Pain Present Now: No Patient Requires Transmission-Based Precautions: No Patient Has Alerts: No Electronic Signature(s) Signed: 04/25/2021 3:16:58 PM By: Donnamarie Poag Entered By: Donnamarie Poag on 04/25/2021 13:57:44 Alex Boyle ([id number]) -------------------------------------------------------------------------------- Clinic Level of Care Assessment Details Patient Name: Alex Boyle Date of Service: 04/25/2021 1:45 PM Medical Record Number: [id number] Patient Account Number: [id number] Date of Birth/Sex: 1945/06/20 (75 y.o. M) Treating RN: Donnamarie Poag Primary Care Simcha Speir: Abelino Derrick Other Clinician: Referring Dayla Gasca: Abelino Derrick Treating Mazen Marcin/Extender: Skipper Cliche in Treatment: 13 Clinic Level of Care Assessment Items TOOL 4 Quantity Score []  - Use when only an EandM is performed on FOLLOW-UP visit 0 ASSESSMENTS - Nursing Assessment / Reassessment []  - Reassessment of Co-morbidities (includes updates in patient status) 0 []  - 0 Reassessment of Adherence to Treatment Plan ASSESSMENTS -  Wound and Skin Assessment / Reassessment X - Simple Wound Assessment / Reassessment - one wound 1 5 []  - 0 Complex Wound Assessment / Reassessment - multiple wounds []  - 0 Dermatologic / Skin Assessment (not related to wound area) ASSESSMENTS - Focused Assessment []  - Circumferential Edema Measurements - multi extremities 0 []  - 0 Nutritional Assessment / Counseling / Intervention []  - 0 Lower Extremity Assessment (monofilament, tuning fork, pulses) []  - 0 Peripheral Arterial Disease Assessment (using hand held doppler) ASSESSMENTS - Ostomy and/or Continence Assessment and Care []  - Incontinence Assessment and Management 0 []  - 0 Ostomy Care Assessment and Management (repouching, etc.) PROCESS - Coordination of Care X - Simple Patient / Family Education for ongoing care 1 15 []  - 0 Complex (extensive) Patient / Family Education for ongoing care []  - 0 Staff obtains Programmer, systems, Records, Test Results / Process Orders []  - 0 Staff telephones HHA, Nursing Homes / Clarify orders / etc []  - 0 Routine Transfer to another Facility (non-emergent condition) []  - 0 Routine Hospital Admission (non-emergent condition) []  - 0 New Admissions / Biomedical engineer / Ordering NPWT, Apligraf, etc. []  - 0 Emergency Hospital Admission (emergent condition) X- 1 10 Simple Discharge Coordination []  - 0 Complex (extensive) Discharge Coordination PROCESS - Special Needs []  - Pediatric / Minor Patient Management 0 []  - 0 Isolation Patient Management []  - 0 Hearing / Language / Visual special needs []  - 0 Assessment of Community assistance (transportation, D/C planning, etc.) []  - 0 Additional assistance / Altered mentation []  - 0 Support Surface(s) Assessment (bed, cushion, seat, etc.) INTERVENTIONS - Wound Cleansing / Measurement Alex Boyle, Alex Boyle ([id number]) X- 1 5 Simple Wound Cleansing - one wound []  - 0 Complex Wound Cleansing - multiple wounds X- 1 5 Wound Imaging (photographs -  any number of wounds) []  - 0 Wound Tracing (instead of photographs) X- 1 5 Simple Wound Measurement - one wound []  - 0 Complex Wound Measurement -  multiple wounds INTERVENTIONS - Wound Dressings []  - Small Wound Dressing one or multiple wounds 0 []  - 0 Medium Wound Dressing one or multiple wounds []  - 0 Large Wound Dressing one or multiple wounds []  - 0 Application of Medications - topical []  - 0 Application of Medications - injection INTERVENTIONS - Miscellaneous []  - External ear exam 0 []  - 0 Specimen Collection (cultures, biopsies, blood, body fluids, etc.) []  - 0 Specimen(s) / Culture(s) sent or taken to Lab for analysis []  - 0 Patient Transfer (multiple staff / Civil Service fast streamer / Similar devices) []  - 0 Simple Staple / Suture removal (25 or less) []  - 0 Complex Staple / Suture removal (26 or more) []  - 0 Hypo / Hyperglycemic Management (close monitor of Blood Glucose) []  - 0 Ankle / Brachial Index (ABI) - do not check if billed separately X- 1 5 Vital Signs Has the patient been seen at the hospital within the last three years: Yes Total Score: 50 Level Of Care: New/Established - Level 2 Electronic Signature(s) Signed: 04/25/2021 3:16:58 PM By: Donnamarie Poag Entered By: Donnamarie Poag on 04/25/2021 14:18:13 Alex Boyle ([id number]) -------------------------------------------------------------------------------- Encounter Discharge Information Details Patient Name: Alex Boyle Date of Service: 04/25/2021 1:45 PM Medical Record Number: [id number] Patient Account Number: [id number] Date of Birth/Sex: 01/13/1946 (75 y.o. M) Treating RN: Donnamarie Poag Primary Care Bradee Common: Abelino Derrick Other Clinician: Referring Rosalva Neary: Abelino Derrick Treating Margan Elias/Extender: Skipper Cliche in Treatment: 13 Encounter Discharge Information Items Discharge Condition: Stable Ambulatory Status: Ambulatory Discharge Destination: Home Transportation: Private Auto Accompanied  By: self Schedule Follow-up Appointment: No Clinical Summary of Care: Electronic Signature(s) Signed: 04/25/2021 3:16:58 PM By: Donnamarie Poag Entered By: Donnamarie Poag on 04/25/2021 14:19:53 Alex Boyle ([id number]) -------------------------------------------------------------------------------- Lower Extremity Assessment Details Patient Name: Alex Boyle Date of Service: 04/25/2021 1:45 PM Medical Record Number: [id number] Patient Account Number: [id number] Date of Birth/Sex: 17-Mar-1946 (75 y.o. M) Treating RN: Donnamarie Poag Primary Care Yarelin Reichardt: Abelino Derrick Other Clinician: Referring Eidan Muellner: Abelino Derrick Treating Jaylin Benzel/Extender: Jeri Cos Weeks in Treatment: 13 Edema Assessment Assessed: [Left: No] Patrice Paradise: Yes] Edema: [Left: N] [Right: o] Vascular Assessment Pulses: Dorsalis Pedis Palpable: [Right:Yes] Electronic Signature(s) Signed: 04/25/2021 3:16:58 PM By: Donnamarie Poag Entered By: Donnamarie Poag on 04/25/2021 14:04:45 Alex Boyle ([id number]) -------------------------------------------------------------------------------- Multi Wound Chart Details Patient Name: Alex Boyle Date of Service: 04/25/2021 1:45 PM Medical Record Number: [id number] Patient Account Number: [id number] Date of Birth/Sex: 07-Sep-1945 (75 y.o. M) Treating RN: Donnamarie Poag Primary Care Kortny Lirette: Abelino Derrick Other Clinician: Referring Flor Houdeshell: Abelino Derrick Treating Antha Niday/Extender: Skipper Cliche in Treatment: 13 Vital Signs Height(in): Pulse(bpm): 84 Weight(lbs): 220 Blood Pressure(mmHg): 121/68 Body Mass Index(BMI): Temperature(F): 98 Respiratory Rate(breaths/min): 16 Photos: [N/A:N/A] Wound Location: Right Toe Second N/A N/A Wounding Event: Surgical Injury N/A N/A Primary Etiology: Open Surgical Wound N/A N/A Comorbid History: Asthma, Sleep Apnea, N/A N/A Hypertension, Myocardial Infarction, Neuropathy Date Acquired: 06/11/2020 N/A N/A Weeks of Treatment: 13  N/A N/A Wound Status: Open N/A N/A Pending Amputation on Yes N/A N/A Presentation: Measurements L x W x D (cm) 0.1x0.1x0.1 N/A N/A Area (cm) : 0.008 N/A N/A Volume (cm) : 0.001 N/A N/A % Reduction in Area: 95.90% N/A N/A % Reduction in Volume: 98.70% N/A N/A Classification: Full Thickness With Exposed N/A N/A Support Structures Exudate Amount: None Present N/A N/A Granulation Amount: None Present (0%) N/A N/A Necrotic Amount: None Present (0%) N/A N/A Exposed Structures: Fascia: No N/A N/A Fat Layer (Subcutaneous Tissue): No Tendon: No Muscle: No  Joint: No Bone: No Epithelialization: Large (67-100%) N/A N/A Treatment Notes Electronic Signature(s) Signed: 04/25/2021 3:16:58 PM By: Donnamarie Poag Entered By: Donnamarie Poag on 04/25/2021 14:05:19 Alex Boyle ([id number]) -------------------------------------------------------------------------------- [place] Details Patient Name: Alex Boyle Date of Service: 04/25/2021 1:45 PM Medical Record Number: [id number] Patient Account Number: [id number] Date of Birth/Sex: 11-29-1945 (75 y.o. M) Treating RN: Donnamarie Poag Primary Care Dysen Edmondson: Abelino Derrick Other Clinician: Referring Juwana Thoreson: Abelino Derrick Treating Maikol Grassia/Extender: Skipper Cliche in Treatment: 13 Active Inactive Electronic Signature(s) Signed: 04/25/2021 3:16:58 PM By: Donnamarie Poag Entered By: Donnamarie Poag on 04/25/2021 14:05:04 Alex Boyle ([id number]) -------------------------------------------------------------------------------- Pain Assessment Details Patient Name: Alex Boyle Date of Service: 04/25/2021 1:45 PM Medical Record Number: [id number] Patient Account Number: [id number] Date of Birth/Sex: 1946/05/06 (75 y.o. M) Treating RN: Donnamarie Poag Primary Care Luzelena Heeg: Abelino Derrick Other Clinician: Referring Jamyiah Labella: Abelino Derrick Treating Brailyn Killion/Extender: Skipper Cliche in Treatment: 13 Active  Problems Location of Pain Severity and Description of Pain Patient Has Paino No Site Locations Rate the pain. Current Pain Level: 0 Pain Management and Medication Current Pain Management: Electronic Signature(s) Signed: 04/25/2021 3:16:58 PM By: Donnamarie Poag Entered By: Donnamarie Poag on 04/25/2021 14:01:36 Alex Boyle ([id number]) -------------------------------------------------------------------------------- Patient/Caregiver Education Details Patient Name: Alex Boyle Date of Service: 04/25/2021 1:45 PM Medical Record Number: [id number] Patient Account Number: [id number] Date of Birth/Gender: [birth year] (75 y.o. M) Treating RN: Donnamarie Poag Primary Care Physician: Abelino Derrick Other Clinician: Referring Physician: Abelino Derrick Treating Physician/Extender: Skipper Cliche in Treatment: 13 Education Assessment Education Provided To: Patient Education Topics Provided Basic Hygiene: Wound/Skin Impairment: Electronic Signature(s) Signed: 04/25/2021 3:16:58 PM By: Donnamarie Poag Entered By: Donnamarie Poag on 04/25/2021 14:18:36 Alex Boyle ([id number]) -------------------------------------------------------------------------------- Wound Assessment Details Patient Name: Alex Boyle Date of Service: 04/25/2021 1:45 PM Medical Record Number: [id number] Patient Account Number: [id number] Date of Birth/Sex: May 07, 1946 (75 y.o. M) Treating RN: Donnamarie Poag Primary Care Myshawn Chir[place]: Abelino Derrick Other Clinician: Referring Freya Zobrist: Abelino Derrick Treating Lyfe Reihl/Extender: Skipper Cliche in Treatment: 13 Wound Status Wound Number: 1 Primary Open Surgical Wound Etiology: Wound Location: Right Toe Second Wound Status: Healed - Epithelialized Wounding Event: Surgical Injury Comorbid Asthma, Sleep Apnea, Hypertension, Myocardial Date Acquired: 06/11/2020 History: Infarction, Neuropathy Weeks Of Treatment: 13 Clustered Wound: No Pending Amputation On  Presentation Photos Wound Measurements Length: (cm) 0 Width: (cm) 0 Depth: (cm) 0 Area: (cm) 0 Volume: (cm) 0 % Reduction in Area: 100% % Reduction in Volume: 100% Epithelialization: Large (67-100%) Tunneling: No Undermining: No Wound Description Classification: Full Thickness With Exposed Support Structures Exudate Amount: None Present Foul Odor After Cleansing: No Slough/Fibrino No Wound Bed Granulation Amount: None Present (0%) Exposed Structure Necrotic Amount: None Present (0%) Fascia Exposed: No Fat Layer (Subcutaneous Tissue) Exposed: No Tendon Exposed: No Muscle Exposed: No Joint Exposed: No Bone Exposed: No Treatment Notes Wound #1 (Toe Second) Wound Laterality: Right Cleanser Peri-Wound Care Topical Primary Dressing Alex Boyle, Alex Boyle ([id number]) Secondary Dressing Secured With Compression Wrap Compression Stockings Add-Ons Electronic Signature(s) Signed: 04/25/2021 3:16:58 PM By: Donnamarie Poag Entered By: Donnamarie Poag on 04/25/2021 14:16:43 Alex Boyle ([id number]) -------------------------------------------------------------------------------- Vitals Details Patient Name: Alex Boyle Date of Service: 04/25/2021 1:45 PM Medical Record Number: [id number] Patient Account Number: [id number] Date of Birth/Sex: 1945-07-07 (75 y.o. M) Treating RN: Donnamarie Poag Primary Care Alfonso Carden: Abelino Derrick Other Clinician: Referring Jalan Fariss: Abelino Derrick Treating Elman Dettman/Extender: Skipper Cliche in Treatment: 13 Vital Signs Time Taken: 14:00 Temperature (F): 98 Weight (lbs): 220 Pulse (  bpm): 76 Respiratory Rate (breaths/min): 16 Blood Pressure (mmHg): 121/68 Reference Range: 80 - 120 mg / dl Electronic Signature(s) Signed: 04/25/2021 3:16:58 PM By: Donnamarie Poag Entered By: Donnamarie Poag on 04/25/2021 14:01:26

## 2021-04-27 DIAGNOSIS — M62838 Other muscle spasm: Secondary | ICD-10-CM | POA: Diagnosis not present

## 2021-04-27 DIAGNOSIS — M6289 Other specified disorders of muscle: Secondary | ICD-10-CM | POA: Diagnosis not present

## 2021-04-27 DIAGNOSIS — M6281 Muscle weakness (generalized): Secondary | ICD-10-CM | POA: Diagnosis not present

## 2021-04-27 DIAGNOSIS — R3915 Urgency of urination: Secondary | ICD-10-CM | POA: Diagnosis not present

## 2021-05-02 ENCOUNTER — Encounter: Payer: Federal, State, Local not specified - PPO | Admitting: Physician Assistant

## 2021-05-09 ENCOUNTER — Encounter: Payer: Federal, State, Local not specified - PPO | Admitting: Physician Assistant

## 2021-05-18 DIAGNOSIS — M6289 Other specified disorders of muscle: Secondary | ICD-10-CM | POA: Diagnosis not present

## 2021-05-18 DIAGNOSIS — M6281 Muscle weakness (generalized): Secondary | ICD-10-CM | POA: Diagnosis not present

## 2021-05-18 DIAGNOSIS — M62838 Other muscle spasm: Secondary | ICD-10-CM | POA: Diagnosis not present

## 2021-05-18 DIAGNOSIS — R3915 Urgency of urination: Secondary | ICD-10-CM | POA: Diagnosis not present

## 2021-05-29 ENCOUNTER — Other Ambulatory Visit: Payer: Self-pay

## 2021-05-29 ENCOUNTER — Telehealth: Payer: Self-pay | Admitting: Family Medicine

## 2021-05-29 DIAGNOSIS — K22719 Barrett's esophagus with dysplasia, unspecified: Secondary | ICD-10-CM

## 2021-05-29 DIAGNOSIS — T7840XA Allergy, unspecified, initial encounter: Secondary | ICD-10-CM

## 2021-05-29 DIAGNOSIS — I10 Essential (primary) hypertension: Secondary | ICD-10-CM

## 2021-05-29 MED ORDER — ALBUTEROL SULFATE HFA 108 (90 BASE) MCG/ACT IN AERS: 1.0000 | INHALATION_SPRAY | Freq: Four times a day (QID) | RESPIRATORY_TRACT | 12 refills | Status: DC | PRN

## 2021-05-29 MED ORDER — OMEPRAZOLE 20 MG PO CPDR: 20.0000 mg | DELAYED_RELEASE_CAPSULE | Freq: Two times a day (BID) | ORAL | 0 refills | Status: DC

## 2021-05-29 MED ORDER — METOPROLOL TARTRATE 25 MG PO TABS: 25.0000 mg | ORAL_TABLET | Freq: Two times a day (BID) | ORAL | 1 refills | Status: DC

## 2021-05-29 MED ORDER — MONTELUKAST SODIUM 10 MG PO TABS
10.0000 mg | ORAL_TABLET | Freq: Every day | ORAL | 0 refills | Status: DC
Start: 2021-05-29 — End: 2021-11-14

## 2021-05-29 NOTE — Telephone Encounter (Signed)
Pt called requesting refill on 5 medications. Stated all of these are not filled by Dr. Ethelene Hal.   Omeprazole Metoprolol Montelukast Nitroglycerin Inhaler-Albuterol

## 2021-05-29 NOTE — Telephone Encounter (Signed)
Refills sent in

## 2021-06-06 MED ORDER — ALBUTEROL SULFATE HFA 108 (90 BASE) MCG/ACT IN AERS: 1.0000 | INHALATION_SPRAY | Freq: Four times a day (QID) | RESPIRATORY_TRACT | 1 refills | Status: AC | PRN

## 2021-06-06 NOTE — Addendum Note (Signed)
Addended by: Jon Billings on: 06/06/2021 12:32 PM   Modules accepted: Orders

## 2021-06-13 ENCOUNTER — Other Ambulatory Visit: Payer: Self-pay

## 2021-06-13 ENCOUNTER — Ambulatory Visit (INDEPENDENT_AMBULATORY_CARE_PROVIDER_SITE_OTHER): Payer: Federal, State, Local not specified - PPO | Admitting: Nurse Practitioner

## 2021-06-13 ENCOUNTER — Encounter: Payer: Self-pay | Admitting: Nurse Practitioner

## 2021-06-13 ENCOUNTER — Ambulatory Visit (INDEPENDENT_AMBULATORY_CARE_PROVIDER_SITE_OTHER)
Admission: RE | Admit: 2021-06-13 | Discharge: 2021-06-13 | Disposition: A | Discharge status: Home / Self Care | Payer: Federal, State, Local not specified - PPO | Source: Ambulatory Visit | Attending: Nurse Practitioner | Admitting: Nurse Practitioner

## 2021-06-13 VITALS — BP 102/64 | HR 75 | Temp 98.0°F | Resp 14 | Ht 71.0 in | Wt 222.1 lb

## 2021-06-13 DIAGNOSIS — R062 Wheezing: Secondary | ICD-10-CM | POA: Diagnosis not present

## 2021-06-13 DIAGNOSIS — J40 Bronchitis, not specified as acute or chronic: Secondary | ICD-10-CM | POA: Insufficient documentation

## 2021-06-13 DIAGNOSIS — R059 Cough, unspecified: Secondary | ICD-10-CM | POA: Diagnosis not present

## 2021-06-13 IMAGING — DX DG CHEST 2V
2 series · 2 of 2 positions shown · non-contrast
Comparison: None.

CLINICAL DATA: Cough and wheezing.

EXAM:
CHEST - 2 VIEW

[chest pa]
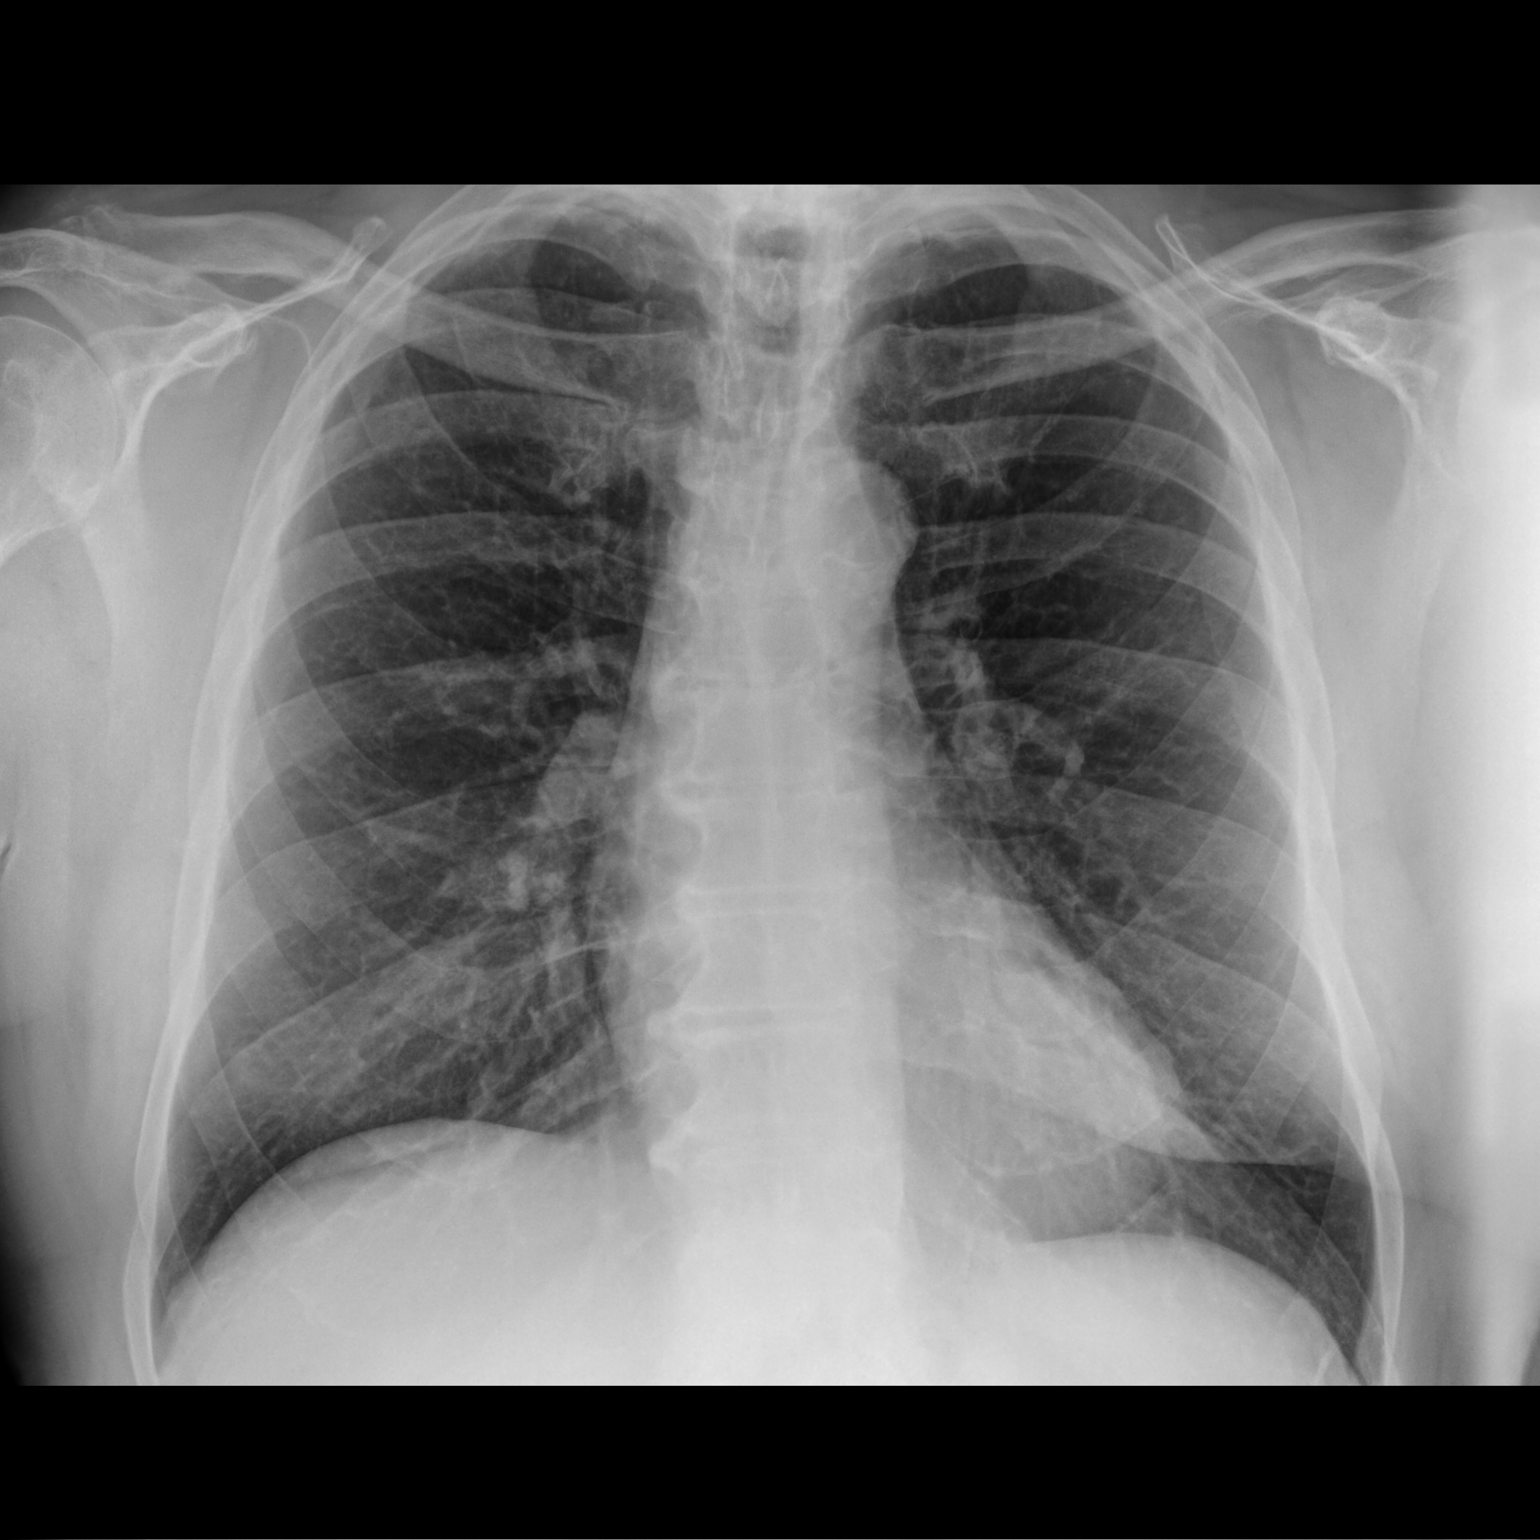

[chest lat]
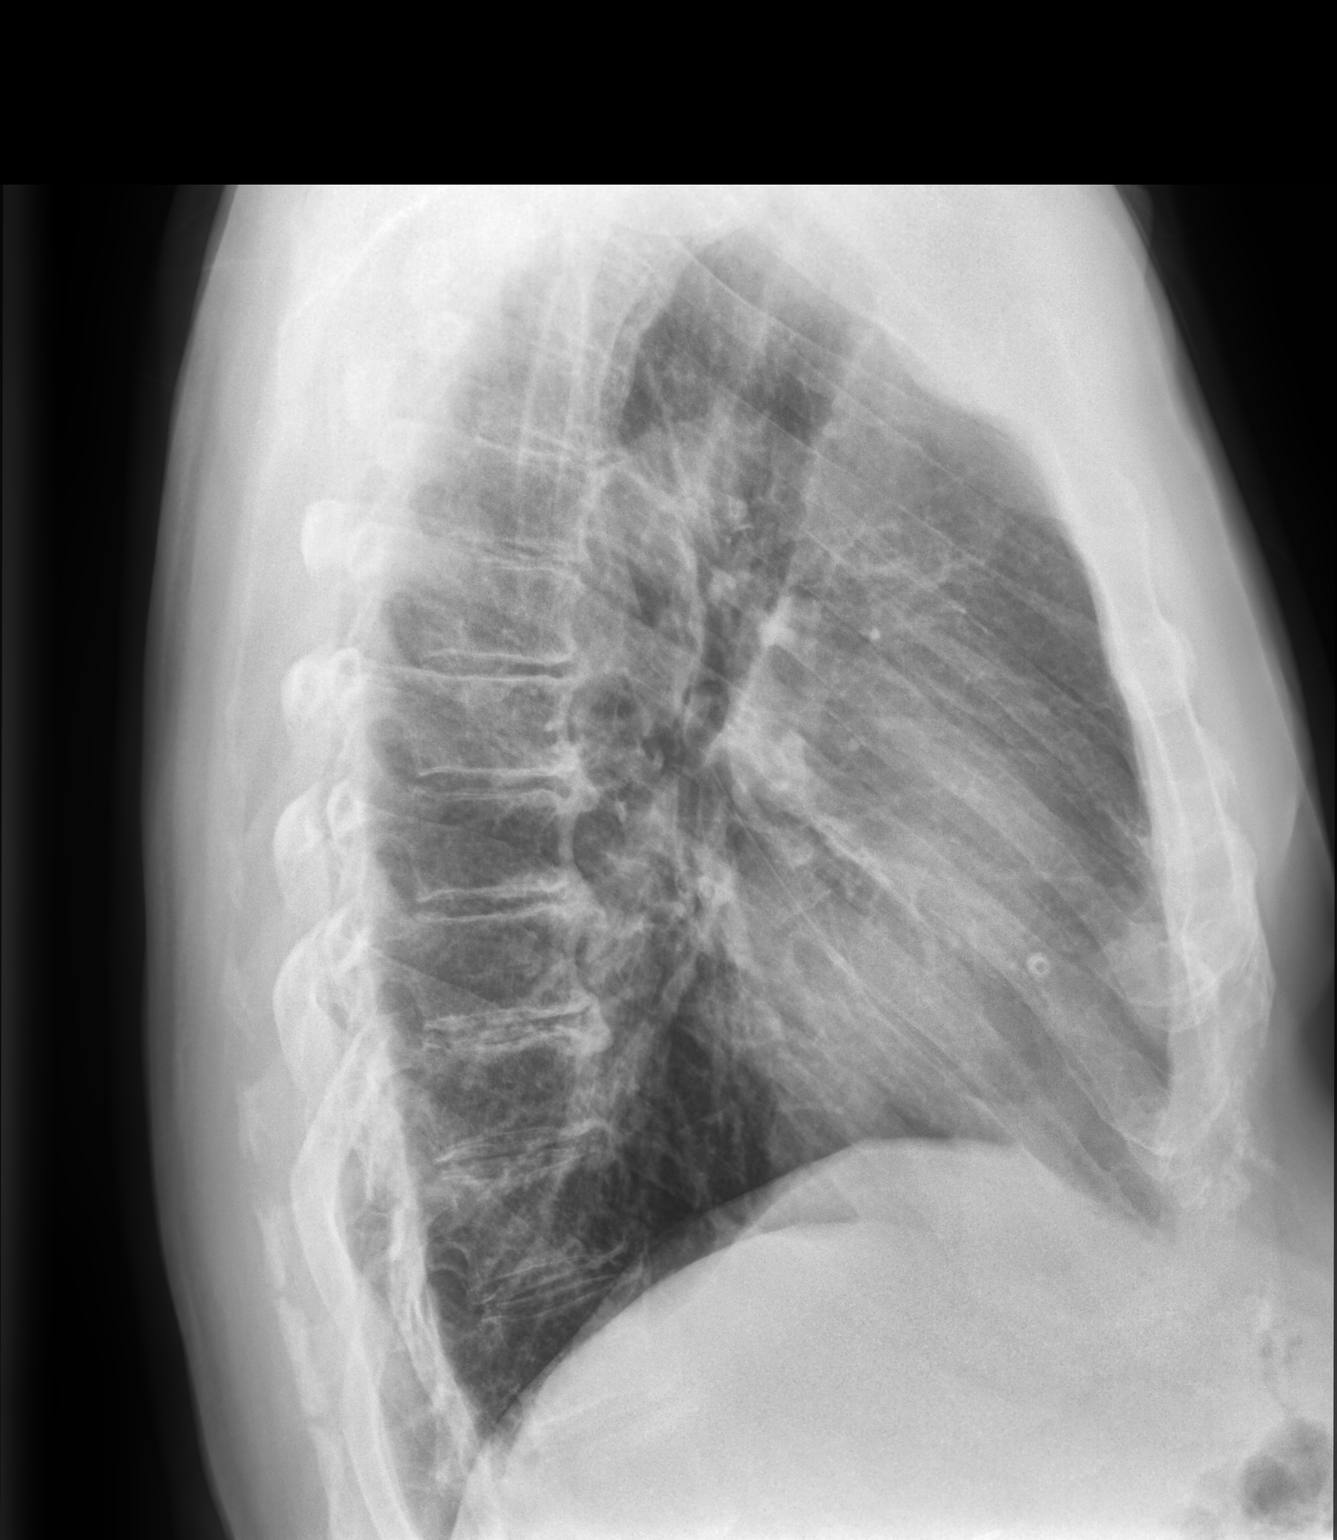

[2 of 2 positions shown; findings below may reference images not displayed]

FINDINGS: The heart size and mediastinal contours are within normal limits.
Both lungs are clear. The visualized skeletal structures are
unremarkable.
IMPRESSION: No active cardiopulmonary disease.

## 2021-06-13 MED ORDER — AZITHROMYCIN 250 MG PO TABS: ORAL_TABLET | ORAL | 0 refills | Status: AC

## 2021-06-13 MED ORDER — PREDNISONE 20 MG PO TABS: ORAL_TABLET | ORAL | 0 refills | Status: AC

## 2021-06-13 NOTE — Patient Instructions (Signed)
Nice to see you today I will be in touch in regards to the chest xray I sent medications to your pharmacy If you are not improving in the next 2-3 days or symptoms worsen follow up with Dr Ethelene Hal

## 2021-06-13 NOTE — Assessment & Plan Note (Signed)
Given patient's symptoms and length of illness and comorbidities will elect to treat.  Since patient is wheezing some shortness of breath we will place patient on a steroid taper.  Also place patient on azithromycin 250 mg as directed on pack.  Will obtain chest x-ray to make sure no pneumonias for follow-up purposes.  Pending chest x-ray result

## 2021-06-13 NOTE — Progress Notes (Signed)
Acute Office Visit  Subjective:    Patient ID: Alex Boyle, male    DOB: Nov 28, 1945, 76 y.o.   MRN: [id number]  Chief Complaint  Patient presents with   Cough    Sx started around 06/08/21-lungs feel heavy, has some itching/tickle in his throat/chest, productive cough with clear phlegm, post nasal drip. Has been taking Mucinnex DM. He did have cold symptoms around 06/03/21 and was getting better until around 06/08/21 again. Covid test on 12/31, 1/1 and 1/2 negative each time.     Patient is in today for cough  States started before christmas. States that he got better after the holidays and then approx 2 days later he started getting worse with symptoms.  Covid test were all negative Has covid vaccines x 2 and 1 booster.  Mucinex has helped  Does have history of asthma currently on Breo and albuterol inhaler. He has had to use the albuterol inhaler.  Past Medical History:  Diagnosis Date   Allergy    Arthritis    Asthma    COPD (chronic obstructive pulmonary disease) ([place])    boderline per pt   Diverticulitis    Hyperlipidemia    Hypertension    Myocardial infarction ([place])    Sleep apnea    uses bipap   Sleep apnea in adult     Past Surgical History:  Procedure Laterality Date   COLON SURGERY     COLONOSCOPY     CORONARY STENT PLACEMENT     x2   FOOT SURGERY     JOINT REPLACEMENT     knee   LAPAROSCOPIC TRANSABDOMINAL HERNIA     MANDIBLE SURGERY     skin cancer     basil cell removed on back   UPPER GASTROINTESTINAL ENDOSCOPY      Family History  Adopted: Yes  Problem Relation Age of Onset   Uterine cancer Mother    Heart disease Mother    Hypertension Mother    Arthritis Mother    Alzheimer's disease Mother    Colon cancer Neg Hx    Pancreatic cancer Neg Hx    Esophageal cancer Neg Hx    Rectal cancer Neg Hx    Stomach cancer Neg Hx     Social History   Socioeconomic History   Marital status: Married    Spouse name: Not on file    Number of children: 1   Years of education: Not on file   Highest education level: Not on file  Occupational History   Not on file  Tobacco Use   Smoking status: Former    Years: 2.00    Types: Cigarettes   Smokeless tobacco: Never  Vaping Use   Vaping Use: Never used  Substance and Sexual Activity   Alcohol use: Yes    Comment: soically    Drug use: Never   Sexual activity: Yes    Birth control/protection: None  Other Topics Concern   Not on file  Social History Narrative   Not on file   Social Determinants of Health   Financial Resource Strain: Not on file  Food Insecurity: Not on file  Transportation Needs: Not on file  Physical Activity: Not on file  Stress: Not on file  Social Connections: Not on file  Intimate Partner Violence: Not on file    Outpatient Medications Prior to Visit  Medication Sig Dispense Refill   albuterol (VENTOLIN HFA) 108 (90 Base) MCG/ACT inhaler Inhale 1-2 puffs into the lungs every  6 (six) hours as needed for wheezing or shortness of breath. 1 each 1   ascorbic acid (VITAMIN C) 1000 MG tablet Take by mouth.     B Complex Vitamins (VITAMIN-B COMPLEX PO) Take by mouth.     BREO ELLIPTA 100-25 MCG/INH AEPB INHALE ONE PUFF BY MOUTH ONE TIME DAILY 60 each 6   Cholecalciferol 25 MCG (1000 UT) capsule Take by mouth.     clindamycin (CLEOCIN) 150 MG capsule Indications: treatment to prevent bacterial infection of a heart valve. Take 600mg  as directed 1 hour prior to dental work     finasteride (PROSCAR) 5 MG tablet Take 1qd (Plz sched appt with new provider for future fills) 90 tablet 1   lisinopril (ZESTRIL) 5 MG tablet Take by mouth.     metoprolol tartrate (LOPRESSOR) 25 MG tablet Take 1 tablet (25 mg total) by mouth 2 (two) times daily. 180 tablet 1   montelukast (SINGULAIR) 10 MG tablet Take 1 tablet (10 mg total) by mouth at bedtime. 90 tablet 0   Multiple Vitamins-Minerals (MULTIVITAMIN ADULTS 50+ PO) Take by mouth.     nitroGLYCERIN  (NITROSTAT) 0.4 MG SL tablet Place under the tongue.     omeprazole (PRILOSEC) 20 MG capsule Take 1 capsule (20 mg total) by mouth 2 (two) times daily before a meal. 180 capsule 0   rosuvastatin (CRESTOR) 40 MG tablet Take 1 tablet (40 mg total) by mouth daily. 90 tablet 1   tamsulosin (FLOMAX) 0.4 MG CAPS capsule Take 1qd (Plz sched appt with new provider for future fills) 90 capsule 0   doxycycline (VIBRAMYCIN) 100 MG capsule Take 100 mg by mouth 2 (two) times daily.     No facility-administered medications prior to visit.    Allergies  Allergen Reactions   Codeine Shortness Of Breath   Gramineae Pollens Itching   Penicillins Swelling    Review of Systems  Constitutional:  Positive for fever (low grade fever). Negative for chills and fatigue.  HENT:  Positive for congestion, postnasal drip and sinus pressure (intermittent). Negative for ear discharge, ear pain, sinus pain and sore throat.   Respiratory:  Positive for cough (clear) and shortness of breath (doe and at rest). Negative for wheezing.   Cardiovascular:  Negative for chest pain.  Gastrointestinal:  Negative for abdominal pain, diarrhea, nausea and vomiting.  Musculoskeletal:  Negative for arthralgias and myalgias.  Neurological:  Negative for headaches.      Objective:    Physical Exam Constitutional:      Appearance: Normal appearance.  HENT:     Right Ear: Tympanic membrane, ear canal and external ear normal. There is no impacted cerumen.     Left Ear: Tympanic membrane, ear canal and external ear normal. There is no impacted cerumen.     Nose:     Right Sinus: No maxillary sinus tenderness or frontal sinus tenderness.     Left Sinus: No maxillary sinus tenderness or frontal sinus tenderness.     Mouth/Throat:     Mouth: Mucous membranes are moist.     Pharynx: Oropharynx is clear.  Eyes:     Extraocular Movements: Extraocular movements intact.     Pupils: Pupils are equal, round, and reactive to light.   Cardiovascular:     Rate and Rhythm: Normal rate and regular rhythm.  Pulmonary:     Effort: Pulmonary effort is normal.     Breath sounds: Examination of the right-upper field reveals wheezing. Examination of the left-upper field reveals wheezing. Examination  of the right-lower field reveals wheezing. Examination of the left-lower field reveals wheezing. Wheezing present.  Abdominal:     General: Bowel sounds are normal.  Lymphadenopathy:     Cervical: No cervical adenopathy.  Skin:    General: Skin is warm.  Neurological:     Mental Status: He is alert.    BP 102/64    Pulse 75    Temp 98 F (36.7 C)    Resp 14    Ht 5\' 11"  (1.803 m)    Wt 222 lb 2 oz (100.8 kg)    SpO2 96%    BMI 30.98 kg/m  Wt Readings from Last 3 Encounters:  06/13/21 222 lb 2 oz (100.8 kg)  02/14/21 218 lb 9.6 oz (99.2 kg)  01/27/21 224 lb (101.6 kg)    Health Maintenance Due  Topic Date Due   Pneumonia Vaccine 57+ Years old (1 - PCV) Never done   Hepatitis C Screening  Never done   TETANUS/TDAP  Never done   Zoster Vaccines- Shingrix (1 of 2) Never done   COVID-19 Vaccine (4 - Booster for Pfizer series) 06/09/2020    There are no preventive care reminders to display for this patient.   Lab Results  Component Value Date   TSH 2.34 11/15/2020   Lab Results  Component Value Date   WBC 7.1 12/22/2020   HGB 16.3 12/22/2020   HCT 47.5 12/22/2020   MCV 91.3 12/22/2020   PLT 182 12/22/2020   Lab Results  Component Value Date   NA 139 11/15/2020   K 4.8 11/15/2020   CO2 29 11/15/2020   GLUCOSE 103 (H) 11/15/2020   BUN 17 11/15/2020   CREATININE 0.79 11/15/2020   BILITOT 0.8 11/15/2020   ALKPHOS 57 11/15/2020   AST 25 11/15/2020   ALT 39 11/15/2020   PROT 7.3 11/15/2020   ALBUMIN 4.5 11/15/2020   CALCIUM 9.9 11/15/2020   GFR 87.75 11/15/2020   Lab Results  Component Value Date   CHOL 185 11/15/2020   Lab Results  Component Value Date   HDL 39.90 11/15/2020   Lab Results   Component Value Date   LDLCALC 79 10/31/2018   Lab Results  Component Value Date   TRIG 221.0 (H) 11/15/2020   Lab Results  Component Value Date   CHOLHDL 5 11/15/2020   Lab Results  Component Value Date   HGBA1C 5.9 11/15/2020       Assessment & Plan:   Problem List Items Addressed This Visit       Respiratory   Bronchitis - Primary    Given patient's symptoms and length of illness and comorbidities will elect to treat.  Since patient is wheezing some shortness of breath we will place patient on a steroid taper.  Also place patient on azithromycin 250 mg as directed on pack.  Will obtain chest x-ray to make sure no pneumonias for follow-up purposes.  Pending chest x-ray result      Relevant Medications   azithromycin (ZITHROMAX) 250 MG tablet   predniSONE (DELTASONE) 20 MG tablet   Other Relevant Orders   DG Chest 2 View (Completed)     No orders of the defined types were placed in this encounter.  This visit occurred during the SARS-CoV-2 public health emergency.  Safety protocols were in place, including screening questions prior to the visit, additional usage of staff PPE, and extensive cleaning of exam room while observing appropriate contact time as indicated for disinfecting solutions.   Matt  Charmian Muff, NP

## 2021-07-13 ENCOUNTER — Ambulatory Visit (INDEPENDENT_AMBULATORY_CARE_PROVIDER_SITE_OTHER): Payer: Federal, State, Local not specified - PPO | Admitting: Pulmonary Disease

## 2021-07-13 ENCOUNTER — Encounter: Payer: Self-pay | Admitting: Pulmonary Disease

## 2021-07-13 ENCOUNTER — Other Ambulatory Visit: Payer: Self-pay

## 2021-07-13 VITALS — BP 112/74 | HR 78 | Temp 98.2°F | Ht 71.0 in | Wt 223.0 lb

## 2021-07-13 DIAGNOSIS — Z9989 Dependence on other enabling machines and devices: Secondary | ICD-10-CM | POA: Diagnosis not present

## 2021-07-13 DIAGNOSIS — G4733 Obstructive sleep apnea (adult) (pediatric): Secondary | ICD-10-CM

## 2021-07-13 MED ORDER — IPRATROPIUM BROMIDE 0.03 % NA SOLN: 2.0000 | Freq: Two times a day (BID) | NASAL | 6 refills | Status: AC

## 2021-07-13 NOTE — Progress Notes (Signed)
Subjective:    Patient ID: Alex Boyle, male    DOB: Oct 08, 1945, 76 y.o.   MRN: [id number]  Patient with a history of obstructive sleep apnea for many years History of asthma-controlled  Has been having difficulty with tolerating his BiPAP with significant skin irritation from the BiPAP mask  Has not to see dermatology multiple times and recently was not able to tolerate the BiPAP  He has been trying to get back to using BiPAP with his spouse fashioning some material to decrease skin contact with the mask  Not necessarily having any significant problems with the mask itself, over the years as gotten used to using the BiPAP  Relocated from [facility] He did have a sleep study performed in January prior to his relocation The study did reveal that he benefited from use of an auto titrating setting He was previously on a CPAP of 14 Most recent titration study titrated him to BiPAP of 23/19  Benefited well from BiPAP when he was able to use it regularly  He does have nasal stuffiness and congestion, runny nose, postnasal drip Since he has not been able to use BiPAP regularly has been trying to rest in a recliner, still wakes up with congestion and coughing from what he thinks is postnasal drip  Usually goes to bed between 930 and 11 PM Falls asleep easily Final awakening between 4 and 5 AM  Weight has fluctuated  Past Medical History:  Diagnosis Date   Allergy    Arthritis    Asthma    COPD (chronic obstructive pulmonary disease) ([place])    boderline per pt   Diverticulitis    Hyperlipidemia    Hypertension    Myocardial infarction ([place])    Sleep apnea    uses bipap   Sleep apnea in adult    Family History  Adopted: Yes  Problem Relation Age of Onset   Uterine cancer Mother    Heart disease Mother    Hypertension Mother    Arthritis Mother    Alzheimer's disease Mother    Colon cancer Neg Hx    Pancreatic cancer Neg Hx    Esophageal cancer Neg Hx     Rectal cancer Neg Hx    Stomach cancer Neg Hx      Review of Systems  Constitutional:  Negative for fever and unexpected weight change.  HENT:  Positive for postnasal drip. Negative for congestion, dental problem, ear pain, nosebleeds, rhinorrhea, sinus pressure, sneezing, sore throat and trouble swallowing.   Eyes:  Negative for redness and itching.  Respiratory:  Positive for apnea, cough and shortness of breath. Negative for chest tightness and wheezing.   Cardiovascular:  Positive for leg swelling. Negative for palpitations.  Gastrointestinal:  Negative for nausea and vomiting.  Genitourinary:  Negative for dysuria.  Musculoskeletal:  Negative for joint swelling.  Skin:  Negative for rash.  Allergic/Immunologic: Positive for environmental allergies. Negative for food allergies and immunocompromised state.  Neurological:  Negative for headaches.  Hematological:  Bruises/bleeds easily.  Psychiatric/Behavioral:  Positive for sleep disturbance. Negative for dysphoric mood. The patient is not nervous/anxious.       Objective:   Physical Exam Constitutional:      Appearance: He is obese.  HENT:     Head: Normocephalic and atraumatic.     Nose: Nose normal.     Mouth/Throat:     Mouth: Mucous membranes are moist.  Eyes:     Extraocular Movements: Extraocular movements intact.  Pupils: Pupils are equal, round, and reactive to light.  Cardiovascular:     Rate and Rhythm: Normal rate and regular rhythm.     Heart sounds: No murmur heard. Pulmonary:     Effort: Pulmonary effort is normal. No respiratory distress.     Breath sounds: Normal breath sounds. No stridor. No wheezing or rhonchi.  Abdominal:     General: There is no distension.     Tenderness: There is no abdominal tenderness.  Musculoskeletal:        General: No swelling or tenderness. Normal range of motion.     Cervical back: Normal range of motion. No rigidity. No muscular tenderness.  Skin:    General: Skin is  warm and dry.     Coloration: Skin is not jaundiced or pale.     Comments: redness, irritation around right lower face  Neurological:     Mental Status: He is alert and oriented to person, place, and time.  Psychiatric:        Mood and Affect: Mood normal.    BP 112/74 (BP Location: Left Arm, Patient Position: Sitting, Cuff Size: Large)    Pulse 78    Temp 98.2 F (36.8 C) (Oral)    Ht 5\' 11"  (1.803 m)    Wt 223 lb (101.2 kg)    SpO2 96%    BMI 31.10 kg/m  Results of the Epworth flowsheet 09/17/2019 01/08/2019  Sitting and reading 1 2  Watching TV 1 2  Sitting, inactive in a public place (e.g. a theatre or a meeting) 0 0  As a passenger in a car for an hour without a break 1 0  Lying down to rest in the afternoon when circumstances permit 3 0  Sitting and talking to someone 0 0  Sitting quietly after a lunch without alcohol 2 0  In a car, while stopped for a few minutes in traffic 0 0  Total score 8 4      Assessment & Plan:  .  Obstructive sleep apnea -Compliant with BiPAP -BiPAP was helping -Having some issues with sensitivity to the headgear  .  Asthma  .  Postnasal drip -Ipratropium nasal  Plan: .  We will continue to work with fashioning something that limits contact of the headgear with his skin  .  He also will be following up with dermatology as the skin is still very sensitive   .  Follow-up in about 6 months   .  Current BiPAP settings of 23/19

## 2021-07-13 NOTE — Patient Instructions (Addendum)
I will see you back in about 6 months  Hopefully the modification to the mask will help and keep the skin less irritated  Addition of Atrovent nasal will help nasal stuffiness and postnasal drip  Call us with any significant concerns or changes

## 2021-07-14 ENCOUNTER — Telehealth: Payer: Self-pay | Admitting: Family Medicine

## 2021-07-14 ENCOUNTER — Ambulatory Visit: Payer: Federal, State, Local not specified - PPO | Admitting: Pulmonary Disease

## 2021-07-14 NOTE — Telephone Encounter (Signed)
Pt has pulled a muscle in his back and would like Dr Ethelene Hal to call in a muscle relaxer.

## 2021-07-14 NOTE — Telephone Encounter (Signed)
No answer LMTCB. Patient needs OV

## 2021-07-14 NOTE — Telephone Encounter (Signed)
Pt called back and I let him know he would need an appt to be evaluated and he said nevermind and hung up

## 2021-07-14 NOTE — Telephone Encounter (Signed)
Message below [place]. I also tried calling the patient back no answer.

## 2021-07-14 NOTE — Telephone Encounter (Signed)
Returned patients call to go over symptoms and to inform that he will need OV for evaluation.

## 2021-07-23 ENCOUNTER — Other Ambulatory Visit: Payer: Self-pay | Admitting: Family Medicine

## 2021-07-27 ENCOUNTER — Other Ambulatory Visit: Payer: Self-pay | Admitting: Family Medicine

## 2021-07-27 DIAGNOSIS — N400 Enlarged prostate without lower urinary tract symptoms: Secondary | ICD-10-CM | POA: Diagnosis not present

## 2021-07-27 DIAGNOSIS — I2510 Atherosclerotic heart disease of native coronary artery without angina pectoris: Secondary | ICD-10-CM

## 2021-07-27 DIAGNOSIS — R3915 Urgency of urination: Secondary | ICD-10-CM | POA: Diagnosis not present

## 2021-07-27 DIAGNOSIS — N401 Enlarged prostate with lower urinary tract symptoms: Secondary | ICD-10-CM | POA: Diagnosis not present

## 2021-07-27 DIAGNOSIS — E7800 Pure hypercholesterolemia, unspecified: Secondary | ICD-10-CM

## 2021-09-04 ENCOUNTER — Encounter: Payer: Self-pay | Admitting: Nurse Practitioner

## 2021-09-04 ENCOUNTER — Ambulatory Visit (INDEPENDENT_AMBULATORY_CARE_PROVIDER_SITE_OTHER): Payer: Federal, State, Local not specified - PPO | Admitting: Nurse Practitioner

## 2021-09-04 VITALS — BP 130/84 | HR 99 | Temp 96.8°F | Wt 226.8 lb

## 2021-09-04 DIAGNOSIS — M545 Low back pain, unspecified: Secondary | ICD-10-CM | POA: Diagnosis not present

## 2021-09-04 MED ORDER — KETOROLAC TROMETHAMINE 60 MG/2ML IM SOLN
30.0000 mg | Freq: Once | INTRAMUSCULAR | Status: AC
  Administered 2021-09-04: 30 mg via INTRAMUSCULAR

## 2021-09-04 MED ORDER — TIZANIDINE HCL 4 MG PO TABS: 4.0000 mg | ORAL_TABLET | Freq: Four times a day (QID) | ORAL | 0 refills | Status: DC | PRN

## 2021-09-04 MED ORDER — TRAMADOL HCL 50 MG PO TABS
50.0000 mg | ORAL_TABLET | Freq: Three times a day (TID) | ORAL | 0 refills | Status: AC | PRN
Start: 2021-09-04 — End: 2021-09-09

## 2021-09-04 NOTE — Progress Notes (Signed)
? ?Acute Office Visit ? ?Subjective:  ? ? Patient ID: Alex Boyle, male    DOB: February 09, 1946, 76 y.o.   MRN: [id number] ? ?Chief Complaint  ?Patient presents with  ? Back Pain  ?  Pt c/o lower back pain due to strenuous activity x3 days   ? ? ?HPI ?Patient is in today for acute back pain. He lifted a heavy box on Friday and injured his right lower back.  ? ?BACK PAIN ? ?Duration: days ?Mechanism of injury: lifting ?Location: Right and low back ?Onset: sudden ?Severity: 7/10 ?Quality: stabbing with move, aching  ?Frequency: constant ?Radiation: none ?Aggravating factors: lifting, movement, walking, bending, and coughing ?Alleviating factors: nothing ?Status: worse ?Treatments attempted: stretches, heat, tylenol ?Relief with NSAIDs?: No NSAIDs Taken ?Nighttime pain:  yes ?Paresthesias / decreased sensation:  no ?Bowel / bladder incontinence:  no ?Fevers:  no ?Dysuria / urinary frequency:  no ? ?Past Medical History:  ?Diagnosis Date  ? Allergy   ? Arthritis   ? Asthma   ? COPD (chronic obstructive pulmonary disease) ([place])   ? boderline per pt  ? Diverticulitis   ? Hyperlipidemia   ? Hypertension   ? Myocardial infarction [facility])   ? Sleep apnea   ? uses bipap  ? Sleep apnea in adult   ? ? ?Past Surgical History:  ?Procedure Laterality Date  ? COLON SURGERY    ? COLONOSCOPY    ? CORONARY STENT PLACEMENT    ? x2  ? FOOT SURGERY    ? JOINT REPLACEMENT    ? knee  ? LAPAROSCOPIC TRANSABDOMINAL HERNIA    ? MANDIBLE SURGERY    ? skin cancer    ? basil cell removed on back  ? UPPER GASTROINTESTINAL ENDOSCOPY    ? ? ?Family History  ?Adopted: Yes  ?Problem Relation Age of Onset  ? Uterine cancer Mother   ? Heart disease Mother   ? Hypertension Mother   ? Arthritis Mother   ? Alzheimer's disease Mother   ? Colon cancer Neg Hx   ? Pancreatic cancer Neg Hx   ? Esophageal cancer Neg Hx   ? Rectal cancer Neg Hx   ? Stomach cancer Neg Hx   ? ? ?Social History  ? ?Socioeconomic History  ? Marital status: Married  ?  Spouse name:  Not on file  ? Number of children: 1  ? Years of education: Not on file  ? Highest education level: Not on file  ?Occupational History  ? Not on file  ?Tobacco Use  ? Smoking status: Former  ?  Years: 2.00  ?  Types: Cigarettes  ? Smokeless tobacco: Never  ?Vaping Use  ? Vaping Use: Never used  ?Substance and Sexual Activity  ? Alcohol use: Yes  ?  Comment: soically   ? Drug use: Never  ? Sexual activity: Yes  ?  Birth control/protection: None  ?Other Topics Concern  ? Not on file  ?Social History Narrative  ? Not on file  ? ?Social Determinants of Health  ? ?Financial Resource Strain: Not on file  ?Food Insecurity: Not on file  ?Transportation Needs: Not on file  ?Physical Activity: Not on file  ?Stress: Not on file  ?Social Connections: Not on file  ?Intimate Partner Violence: Not on file  ? ? ?Outpatient Medications Prior to Visit  ?Medication Sig Dispense Refill  ? albuterol (VENTOLIN HFA) 108 (90 Base) MCG/ACT inhaler Inhale 1-2 puffs into the lungs every 6 (six) hours as needed for  wheezing or shortness of breath. 1 each 1  ? ascorbic acid (VITAMIN C) 1000 MG tablet Take by mouth.    ? B Complex Vitamins (VITAMIN-B COMPLEX PO) Take by mouth.    ? BREO ELLIPTA 100-25 MCG/INH AEPB INHALE ONE PUFF BY MOUTH ONE TIME DAILY 60 each 6  ? Cholecalciferol 25 MCG (1000 UT) capsule Take by mouth.    ? clindamycin (CLEOCIN) 150 MG capsule Indications: treatment to prevent bacterial infection of a heart valve. Take '600mg'$  as directed 1 hour prior to dental work    ? finasteride (PROSCAR) 5 MG tablet Take 1qd (Plz sched appt with new provider for future fills) 90 tablet 1  ? ipratropium (ATROVENT) 0.03 % nasal spray Place 2 sprays into both nostrils every 12 (twelve) hours. 30 mL 6  ? lisinopril (ZESTRIL) 5 MG tablet TAKE ONE TABLET BY MOUTH ONE TIME DAILY 90 tablet 0  ? metoprolol tartrate (LOPRESSOR) 25 MG tablet Take 1 tablet (25 mg total) by mouth 2 (two) times daily. 180 tablet 1  ? montelukast (SINGULAIR) 10 MG tablet  Take 1 tablet (10 mg total) by mouth at bedtime. 90 tablet 0  ? Multiple Vitamins-Minerals (MULTIVITAMIN ADULTS 50+ PO) Take by mouth.    ? nitroGLYCERIN (NITROSTAT) 0.4 MG SL tablet Place under the tongue.    ? omeprazole (PRILOSEC) 20 MG capsule Take 1 capsule (20 mg total) by mouth 2 (two) times daily before a meal. 180 capsule 0  ? rosuvastatin (CRESTOR) 40 MG tablet TAKE ONE TABLET BY MOUTH ONE TIME DAILY 90 tablet 1  ? tamsulosin (FLOMAX) 0.4 MG CAPS capsule Take 1qd (Plz sched appt with new provider for future fills) 90 capsule 0  ? ?No facility-administered medications prior to visit.  ? ? ?Allergies  ?Allergen Reactions  ? Codeine Shortness Of Breath  ? Gramineae Pollens Itching  ? Penicillins Swelling  ? ? ?Review of Systems ?See pertinent positives and negatives per HPI. ?   ?Objective:  ?  ?Physical Exam ?Vitals and nursing note reviewed.  ?Constitutional:   ?   Appearance: Normal appearance.  ?HENT:  ?   Head: Normocephalic.  ?Eyes:  ?   Conjunctiva/sclera: Conjunctivae normal.  ?Pulmonary:  ?   Effort: Pulmonary effort is normal.  ?Musculoskeletal:     ?   General: No swelling, tenderness or deformity.  ?   Cervical back: Normal range of motion.  ?   Comments: ROM limited due to pain.  Straight leg raise negative bilaterally  ?Skin: ?   General: Skin is warm.  ?Neurological:  ?   General: No focal deficit present.  ?   Mental Status: He is alert and oriented to person, place, and time.  ?Psychiatric:     ?   Mood and Affect: Mood normal.     ?   Behavior: Behavior normal.     ?   Thought Content: Thought content normal.     ?   Judgment: Judgment normal.  ? ? ?BP 130/84 (BP Location: Left Arm, Patient Position: Sitting, Cuff Size: Normal)   Pulse 99   Temp (!) 96.8 ?F (36 ?C) (Temporal)   Wt 226 lb 12.8 oz (102.9 kg)   SpO2 96%   BMI 31.63 kg/m?  ?Wt Readings from Last 3 Encounters:  ?09/04/21 226 lb 12.8 oz (102.9 kg)  ?07/13/21 223 lb (101.2 kg)  ?06/13/21 222 lb 2 oz (100.8 kg)  ? ? ?Health  Maintenance Due  ?Topic Date Due  ? Hepatitis C Screening  Never done  ? TETANUS/TDAP  Never done  ? Zoster Vaccines- Shingrix (1 of 2) Never done  ? Pneumonia Vaccine 75+ Years old (1 - PCV) Never done  ? COVID-19 Vaccine (4 - Booster for Pfizer series) 06/09/2020  ? ? ?There are no preventive care reminders to display for this patient. ? ? ?Lab Results  ?Component Value Date  ? TSH 2.34 11/15/2020  ? ?Lab Results  ?Component Value Date  ? WBC 7.1 12/22/2020  ? HGB 16.3 12/22/2020  ? HCT 47.5 12/22/2020  ? MCV 91.3 12/22/2020  ? PLT 182 12/22/2020  ? ?Lab Results  ?Component Value Date  ? NA 139 11/15/2020  ? K 4.8 11/15/2020  ? CO2 29 11/15/2020  ? GLUCOSE 103 (H) 11/15/2020  ? BUN 17 11/15/2020  ? CREATININE 0.79 11/15/2020  ? BILITOT 0.8 11/15/2020  ? ALKPHOS 57 11/15/2020  ? AST 25 11/15/2020  ? ALT 39 11/15/2020  ? PROT 7.3 11/15/2020  ? ALBUMIN 4.5 11/15/2020  ? CALCIUM 9.9 11/15/2020  ? GFR 87.75 11/15/2020  ? ?Lab Results  ?Component Value Date  ? CHOL 185 11/15/2020  ? ?Lab Results  ?Component Value Date  ? HDL 39.90 11/15/2020  ? ?Lab Results  ?Component Value Date  ? [place] 79 10/31/2018  ? ?Lab Results  ?Component Value Date  ? TRIG 221.0 (H) 11/15/2020  ? ?Lab Results  ?Component Value Date  ? CHOLHDL 5 11/15/2020  ? ?Lab Results  ?Component Value Date  ? HGBA1C 5.9 11/15/2020  ? ? ?   ?Assessment & Plan:  ? ?Problem List Items Addressed This Visit   ? ?  ? Other  ? Acute right-sided low back pain without sciatica - Primary  ?  Acute back pain after incorrectly lifting a heavy box.  No red flags on exam.  Will give 30 mg Toradol IM in office today.  Tizanidine as needed muscle spasms, may make him sleepy.  Continue stretches, heat, Tylenol as needed.  He is going on a flight on Thursday and is asking for some tramadol as this is helped him in the past with injury.  PDMP reviewed.  We will send in tramadol 50 mg every 8 hours as needed #15.  We will also place a referral to physical therapy.  Follow-up  if symptoms do not improve or worsen.  ?  ?  ? Relevant Medications  ? tiZANidine (ZANAFLEX) 4 MG tablet  ? traMADol (ULTRAM) 50 MG tablet  ? Other Relevant Orders  ? Ambulatory referral to Physical Therapy

## 2021-09-04 NOTE — Assessment & Plan Note (Signed)
Acute back pain after incorrectly lifting a heavy box.  No red flags on exam.  Will give 30 mg Toradol IM in office today.  Tizanidine as needed muscle spasms, may make him sleepy.  Continue stretches, heat, Tylenol as needed.  He is going on a flight on Thursday and is asking for some tramadol as this is helped him in the past with injury.  PDMP reviewed.  We will send in tramadol 50 mg every 8 hours as needed #15.  We will also place a referral to physical therapy.  Follow-up if symptoms do not improve or worsen.  ?

## 2021-09-04 NOTE — Patient Instructions (Addendum)
It was great to see you! ? ?Start tizanidine muscle relaxer every 6 hours as needed for muscle spasms. I have placed a referral to Physical Therapy.  ? ?Let's follow-up if symptoms don't improve or worsen. Schedule a routine follow-up with Dr. Ethelene Hal  ? ?Take care, ? ?Vance Peper, NP ? ?

## 2021-09-13 ENCOUNTER — Telehealth: Payer: Self-pay | Admitting: Family Medicine

## 2021-09-13 MED ORDER — TIZANIDINE HCL 4 MG PO TABS: 4.0000 mg | ORAL_TABLET | Freq: Four times a day (QID) | ORAL | 0 refills | Status: DC | PRN

## 2021-09-13 MED ORDER — TRAMADOL HCL 50 MG PO TABS: 50.0000 mg | ORAL_TABLET | Freq: Three times a day (TID) | ORAL | 0 refills | Status: AC | PRN

## 2021-09-13 NOTE — Telephone Encounter (Signed)
Pt saw Lauren on 3/27 for back injury. He is almost out of his meds she prescribed. Muscle relaxer and pain. Hw has an appt with PT on next Monday and wonders if he can have those meds extended so he can make till the PT. ?

## 2021-09-18 ENCOUNTER — Ambulatory Visit: Payer: Federal, State, Local not specified - PPO | Attending: Nurse Practitioner | Admitting: Physical Therapy

## 2021-09-18 DIAGNOSIS — M5459 Other low back pain: Secondary | ICD-10-CM | POA: Diagnosis not present

## 2021-09-18 DIAGNOSIS — M5441 Lumbago with sciatica, right side: Secondary | ICD-10-CM | POA: Insufficient documentation

## 2021-09-18 DIAGNOSIS — M545 Low back pain, unspecified: Secondary | ICD-10-CM | POA: Diagnosis not present

## 2021-09-18 DIAGNOSIS — M6281 Muscle weakness (generalized): Secondary | ICD-10-CM | POA: Insufficient documentation

## 2021-09-18 DIAGNOSIS — R252 Cramp and spasm: Secondary | ICD-10-CM | POA: Insufficient documentation

## 2021-09-18 DIAGNOSIS — R293 Abnormal posture: Secondary | ICD-10-CM | POA: Insufficient documentation

## 2021-09-18 NOTE — Patient Instructions (Signed)
Access Code: [email or web address] ?URL: [email or web address] ?Date: 09/18/2021 ?Prepared by: Glenetta Hew ? ?Exercises ?- Supine Lower Trunk Rotation  - 1 x daily - 7 x weekly - 2 sets - 10 reps ?- Supine Transversus Abdominis Bracing - Hands on Stomach  - 1 x daily - 7 x weekly - 1 sets - 10 reps ?- Seated Hamstring Stretch  - 1 x daily - 7 x weekly - 1 sets - 3 reps - 30 hold ?- Supine Figure 4 Piriformis Stretch  - 1 x daily - 7 x weekly - 1 sets - 3 reps - 30 hold ?- Hooklying Single Knee to Chest Stretch  - 1 x daily - 7 x weekly - 1 sets - 3 reps - 30 hold ?

## 2021-09-18 NOTE — Therapy (Signed)
[place] ?[facility] ?[place] [address] ?[place], [place], [address] ?Phone: [phone]   Fax:  [phone] ? ?Physical Therapy Evaluation ? ?Patient Details  ?Name: Alex Boyle [facility] ?MRN: [id number] ?Date of Birth: 13-Jul-1945 ?Referring Provider (PT): McElwee, lauren A. ? ? ?Encounter Date: 09/18/2021 ? ? PT End of Session - 09/18/21 1743   ? ? Visit Number 1   ? Number of Visits 12   ? Date for PT Re-Evaluation 10/30/21   ? [place] + Medicare   ? Progress Note Due on Visit 10   ? PT Start Time 3845   ? PT Stop Time 1533   ? PT Time Calculation (min) 46 min   ? Activity Tolerance Patient tolerated treatment well   ? Behavior During Therapy [facility] for tasks assessed/performed   ? ?  ?  ? ?  ? ? ?Past Medical History:  ?Diagnosis Date  ? Allergy   ? Arthritis   ? Asthma   ? COPD (chronic obstructive pulmonary disease) ([place])   ? boderline per pt  ? Diverticulitis   ? Hyperlipidemia   ? Hypertension   ? Myocardial infarction [facility])   ? Sleep apnea   ? uses bipap  ? Sleep apnea in adult   ? ? ?Past Surgical History:  ?Procedure Laterality Date  ? COLON SURGERY    ? COLONOSCOPY    ? CORONARY STENT PLACEMENT    ? x2  ? FOOT SURGERY    ? JOINT REPLACEMENT    ? knee  ? LAPAROSCOPIC TRANSABDOMINAL HERNIA    ? MANDIBLE SURGERY    ? skin cancer    ? basil cell removed on back  ? UPPER GASTROINTESTINAL ENDOSCOPY    ? ? ?There were no vitals filed for this visit. ? ? ? Subjective Assessment - 09/18/21 1448   ? ? Subjective Pt. reports R leg has always been shorter than the Left, size 9 on R, size 10.5 on L, "so I've had screwed up body mechanics all my life."  Had orthotics but not comfortable. He reports history of LBP, but bent over to pick up box and threw back out again.  Usually muscle relaxers and hot bathes helped, but not this time,  spasms and shoots straight line across back.   ? Pertinent History From MD notes on 09/04/21- Acute back pain after  incorrectly lifting a heavy box.  No red flags on exam.  Will give 30 mg Toradol IM in office today.  Tizanidine as needed muscle spasms, may make him sleepy.  Continue stretches, heat, Tylenol as needed.  He is going on a flight on Thursday and is asking for some tramadol as this is helped him in the past with injury.  PDMP reviewed.  We will send in tramadol 50 mg every 8 hours as needed #15.  We will also place a referral to physical therapy.   ? How long can you sit comfortably? dull ache, needs assistance with standing.  Limits sitting to 15 min on hour   ? How long can you walk comfortably? limited with foot pain   ? Diagnostic tests none   ? Patient Stated Goals decrease pain   ? Currently in Pain? Yes   ? Pain Score 4    8-9/10 with putting dishes away  ? Pain Location Back   ? Pain Orientation Right;Lower   ? Pain Descriptors / Indicators Aching;Dull;Spasm   ? Pain Type Acute pain   ?  Pain Radiating Towards to R buttock   ? Pain Onset 1 to 4 weeks ago   3 weeks ago  ? Pain Frequency Constant   ? Aggravating Factors  move wrong, twist   ? Pain Relieving Factors hot baths, muscle relaxors, tramadol, exercises   ? Effect of Pain on Daily Activities difficulty with ADLs, constant pain.   ? ?  ?  ? ?  ? ? ? ? ? [facility] PT Assessment - 09/18/21 0001   ? ?  ? Assessment  ? Medical Diagnosis M54.50 (ICD-10-CM) - Acute right-sided low back pain without sciatica   ? Referring Provider (PT) McElwee, lauren A.   ? Onset Date/Surgical Date 08/28/21   ? Prior Therapy no   ?  ? Precautions  ? Precautions None   ?  ? Restrictions  ? Weight Bearing Restrictions No   ?  ? Balance Screen  ? Has the patient fallen in the past 6 months No   ? Has the patient had a decrease in activity level because of a fear of falling?  No   ? Is the patient reluctant to leave their home because of a fear of falling?  No   ?  ? Prior Function  ? Level of Independence Independent   ? Vocation Retired   ? Leisure walking with wife   ?  ? Cognition   ? Overall Cognitive Status Within Functional Limits for tasks assessed   ?  ? Observation/Other Assessments  ? Observations enters independently, noted very slow and guarded transitioning from sitting to standing,   ? Focus on Therapeutic Outcomes (FOTO)  lumbar 45%; predicted outcome 65% after 11 visits   ?  ? Posture/Postural Control  ? Posture/Postural Control Postural limitations   ? Postural Limitations Rounded Shoulders;Forward head;Decreased lumbar lordosis;Weight shift right   ? Posture Comments noted leg length discrepency R LE shorter, R hip lower in standing   ?  ? ROM / Strength  ? AROM / PROM / Strength AROM;Strength;PROM   ?  ? AROM  ? Overall AROM  Deficits;Due to pain   ? Overall AROM Comments slow guarded movements, increased R sided low back pain with all lumbar movements   ? AROM Assessment Site Lumbar   ? Lumbar Flexion limited 75%   ? Lumbar Extension limited 75%   ? Lumbar - Right Side Bend to mid thigh   ? Lumbar - Left Side Bend close to knee   ? Lumbar - Right Rotation limited 50%   ? Lumbar - Left Rotation limited 50%   ?  ? PROM  ? Overall PROM  Deficits   ? Overall PROM Comments significant tightness bil hips, R slightly tighter than L   ? PROM Assessment Site Hip   ? Right/Left Hip Right;Left   ? Right Hip External Rotation  40   ? Right Hip Internal Rotation  15   ? Left Hip External Rotation  45   ? Left Hip Internal Rotation  15   ?  ? Strength  ? Overall Strength Within functional limits for tasks performed   ? Overall Strength Comments tested in sitting   ? Strength Assessment Site Hip;Knee;Ankle   ? Right/Left Hip Right;Left   ? Right Hip Flexion 5/5   ? Right Hip Extension 5/5   ? Right Hip ABduction 5/5   ? Right Hip ADduction 5/5   ? Left Hip Flexion 5/5   ? Left Hip Extension 4+/5   ? Left Hip  ABduction 5/5   ? Left Hip ADduction 5/5   ? Right/Left Knee Right;Left   ? Right Knee Flexion 5/5   ? Right Knee Extension 5/5   ? Left Knee Flexion 5/5   ? Left Knee Extension 5/5   ?  Right/Left Ankle Right;Left   ? Right Ankle Dorsiflexion 5/5   ? Left Ankle Dorsiflexion 5/5   ?  ? Flexibility  ? Soft Tissue Assessment /Muscle Length yes   ? Hamstrings tightness bil   ?  ? Palpation  ? Spinal mobility hypomobility throughout lumbar spine   ? Palpation comment tenderness R sided lumbar paraspinals, QL, glut med and piriformis   ?  ? Special Tests  ? Other special tests Neg SLR bil   ? ?  ?  ? ?  ? ? ? ? ? ? ? ? ? ? ? ? ? ?Objective measurements completed on examination: See above findings.  ? ? ? ? ? [place] Adult PT Treatment/Exercise - 09/18/21 0001   ? ?  ? Exercises  ? Exercises Lumbar   ?  ? Lumbar Exercises: Stretches  ? Passive Hamstring Stretch Right;Left;1 rep;20 seconds   ? Passive Hamstring Stretch Limitations seated, VC for technique, tolerated well.   ? Single Knee to Chest Stretch Right;2 reps;10 seconds   ? Single Knee to Chest Stretch Limitations can do hooklying or knee extended   ? Lower Trunk Rotation Limitations   ? Lower Trunk Rotation Limitations x 10 pain free ROM   ? Pelvic Tilt Limitations demo   ? Piriformis Stretch Right;2 reps   ? Piriformis Stretch Limitations KTOS stretch   ? Figure 4 Stretch 2 reps;10 seconds   ? Figure 4 Stretch Limitations pressing knee away, able to perform easier than grabbing other leg   ?  ? Lumbar Exercises: Supine  ? Bridge 5 reps   ? Bridge Limitations increased discomfort, recommended just PPT for now.   ? ?  ?  ? ?  ? ? ? ? ? ? ? ? ? ? PT Education - 09/18/21 1543   ? ? Education Details educated on findings, POC, effect of Leg length discrepency, issued heel lift, also discussed options for shoes. Initial HEP also issued.  Access Code: [email or web address]   ? Person(s) Educated Patient   ? Methods Explanation;Demonstration;Verbal cues;Handout   ? Comprehension Verbalized understanding;Returned demonstration   ? ?  ?  ? ?  ? ? ? PT Short Term Goals - 09/18/21 1741   ? ?  ? PT SHORT TERM GOAL #1  ? Title Ind with initial HEP   ? Time 2   ? Period Weeks    ? Status New   ? Target Date 10/02/21   ? ?  ?  ? ?  ? ? ? ? PT Long Term Goals - 09/18/21 1741   ? ?  ? PT LONG TERM GOAL #1  ? Title Pt. will be independent with progressed HEP to improve functiona

## 2021-09-26 ENCOUNTER — Ambulatory Visit: Payer: Federal, State, Local not specified - PPO

## 2021-09-26 DIAGNOSIS — M545 Low back pain, unspecified: Secondary | ICD-10-CM | POA: Diagnosis not present

## 2021-09-26 DIAGNOSIS — M5459 Other low back pain: Secondary | ICD-10-CM | POA: Diagnosis not present

## 2021-09-26 DIAGNOSIS — R293 Abnormal posture: Secondary | ICD-10-CM | POA: Diagnosis not present

## 2021-09-26 DIAGNOSIS — R252 Cramp and spasm: Secondary | ICD-10-CM

## 2021-09-26 DIAGNOSIS — M5441 Lumbago with sciatica, right side: Secondary | ICD-10-CM | POA: Diagnosis not present

## 2021-09-26 DIAGNOSIS — M6281 Muscle weakness (generalized): Secondary | ICD-10-CM

## 2021-09-26 NOTE — Therapy (Signed)
[place] ?[facility] ?[address] ?[place], [place], [address] ?Phone: [phone]   Fax:  [phone] ? ?Physical Therapy Treatment ? ?Patient Details  ?Name: Alex Boyle [facility] ?MRN: [id number] ?Date of Birth: [birth year] ?Referring Provider (PT): McElwee, lauren A. ? ? ?Encounter Date: 09/26/2021 ? ? PT End of Session - 09/26/21 1615   ? ? Visit Number 2   ? Number of Visits 12   ? Date for PT Re-Evaluation 10/30/21   ? [place] + Medicare   ? Progress Note Due on Visit 10   ? PT Start Time 4431   pt late  ? PT Stop Time 5400   ? PT Time Calculation (min) 35 min   ? Activity Tolerance Patient tolerated treatment well   ? Behavior During Therapy [facility] for tasks assessed/performed   ? ?  ?  ? ?  ? ? ?Past Medical History:  ?Diagnosis Date  ? Allergy   ? Arthritis   ? Asthma   ? COPD (chronic obstructive pulmonary disease) ([place])   ? boderline per pt  ? Diverticulitis   ? Hyperlipidemia   ? Hypertension   ? Myocardial infarction [facility])   ? Sleep apnea   ? uses bipap  ? Sleep apnea in adult   ? ? ?Past Surgical History:  ?Procedure Laterality Date  ? COLON SURGERY    ? COLONOSCOPY    ? CORONARY STENT PLACEMENT    ? x2  ? FOOT SURGERY    ? JOINT REPLACEMENT    ? knee  ? LAPAROSCOPIC TRANSABDOMINAL HERNIA    ? MANDIBLE SURGERY    ? skin cancer    ? basil cell removed on back  ? UPPER GASTROINTESTINAL ENDOSCOPY    ? ? ?There were no vitals filed for this visit. ? ? Subjective Assessment - 09/26/21 1542   ? ? 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   ? Pertinent History From MD notes on 09/04/21- Acute back pain after incorrectly lifting a heavy box.  No red flags on exam.  Will give 30 mg Toradol IM in office today.  Tizanidine as needed muscle spasms, may make him sleepy.  Continue stretches, heat, Tylenol as needed.  He is going on a flight on Thursday and is asking for some  tramadol as this is helped him in the past with injury.  PDMP reviewed.  We will send in tramadol 50 mg every 8 hours as needed #15.  We will also place a referral to physical therapy.   ? Diagnostic tests none   ? Patient Stated Goals decrease pain   ? Currently in Pain? Yes   ? Pain Score 4    ? Pain Location Back   ? Pain Orientation Right;Lower   ? Pain Descriptors / Indicators Aching;Dull;Spasm   ? Pain Type Acute pain   ? ?  ?  ? ?  ? ? ? ? ? ? ? ? ? ? ? ? ? ? ? ? ? ? ? ? [place] Adult PT Treatment/Exercise - 09/26/21 0001   ? ?  ? Exercises  ? Exercises Lumbar   ?  ? Lumbar Exercises: Stretches  ? Piriformis Stretch Right;2 reps;30 seconds   ? Piriformis Stretch Limitations KTOS stretch   ?  ? Lumbar Exercises: Aerobic  ? Recumbent Bike [id number]   ?  ? Manual Therapy  ? Manual Therapy Soft  tissue mobilization;Myofascial release   ? Soft tissue mobilization STM to B lumbar paraspinals, QL, R glutes   ? Myofascial Release to R glute med   ? ?  ?  ? ?  ? ? ? ? ? ? ? ? ? ? ? ? PT Short Term Goals - 09/26/21 1754   ? ?  ? PT SHORT TERM GOAL #1  ? Title Ind with initial HEP   ? Time 2   ? Period Weeks   ? Status On-going   ? Target Date 10/02/21   ? ?  ?  ? ?  ? ? ? ? PT Long Term Goals - 09/26/21 1755   ? ?  ? PT LONG TERM GOAL #1  ? Title Pt. will be independent with progressed HEP to improve functional outcomes.   ? Time 6   ? Period Weeks   ? Status On-going   ? Target Date 10/30/21   ?  ? PT LONG TERM GOAL #2  ? Title Pt. will report 75% improvement in R sided LBP   ? Time 6   ? Period Weeks   ? Status On-going   ? Target Date 10/30/21   ?  ? PT LONG TERM GOAL #3  ? Title Pt. will score 65% on FOTO to demonstrate improved functional ability   ? Baseline 45%   ? Time 6   ? Period Weeks   ? Status On-going   ? Target Date 10/30/21   ?  ? PT LONG TERM GOAL #4  ? Title Pt. will be able to return to PLOF without limitations from LBP.   ? Time 6   ? Period Weeks   ? Status On-going   ? Target Date 10/30/21   ? ?  ?  ? ?   ? ? ? ? ? ? ? ? Plan - 09/26/21 1616   ? ? Clinical Impression Statement Pt arrived 8 min late. Pt ambulated into clinic with flexed trunk and with gaurded movements . 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 Most of the session was focused on MT to decrease his pain and soreness. Afterwards he noted an improvement in mobility, we ran out of time before being able to progress HEP.   ? Personal Factors and Comorbidities Age;Past/Current Experience;Comorbidity 3+   ? Comorbidities leg length discrepency, OA, history MI x 2, R foot surgery with fusions, L TKR   ? PT Frequency 2x / week   ? PT Duration 6 weeks   ? PT Treatment/Interventions ADLs/Self Care Home Management;Cryotherapy;Electrical Stimulation;Moist Heat;Traction;Ultrasound;Gait training;Stair training;Functional mobility training;Therapeutic activities;Therapeutic exercise;Balance training;Neuromuscular re-education;Patient/family education;Orthotic Fit/Training;Manual techniques;Passive range of motion;Dry needling;Taping;Spinal Manipulations;Joint Manipulations   ? PT Next Visit Plan review and progress HEP, manual therapy to back, consider DN   ? PT Home Exercise Plan Access Code: [email or web address]   ? Consulted and Agree with Plan of Care Patient   ? ?  ?  ? ?  ? ? ?Patient will benefit from skilled therapeutic intervention in order to improve the following deficits and impairments:  Decreased activity tolerance, Decreased endurance, Decreased range of motion, Decreased strength, Increased fascial restricitons, Pain, Improper body mechanics, Impaired flexibility, Increased muscle spasms, Hypomobility, Decreased mobility ? ?Visit Diagnosis: ?Acute right-sided low back pain with right-sided sciatica ? ?Cramp and spasm ? ?Abnormal posture ? ?Muscle weakness (generalized) ? ? ? ? ?Problem List ?Patient Active Problem List  ?  Diagnosis Date Noted  ? Acute right-sided low back  pain without sciatica 09/04/2021  ? Bronchitis 06/13/2021  ? Healthcare maintenance 11/14/2020  ? History of colon polyps 11/14/2020  ? Barrett's esophagus with dysplasia 11/14/2020  ? Ulcer of right foot, limited to breakdown of skin ([place]) 11/14/2020  ? Neuropathy 11/14/2020  ? Asthma exacerbation 04/22/2019  ? Lumbar strain 04/22/2019  ? Venous insufficiency 01/08/2019  ? Numbness and tingling of both feet 01/08/2019  ? History of basal cell cancer 01/08/2019  ? Tinea cruris 01/08/2019  ? Flank pain 11/14/2018  ? Hyperlipidemia LDL goal <70 10/13/2018  ? Essential hypertension 09/23/2018  ? CAD (coronary artery disease) 09/23/2018  ? Asthma 09/23/2018  ? OSA (obstructive sleep apnea) 09/23/2018  ? Benign prostatic hyperplasia with lower urinary tract symptoms 09/23/2018  ? ? ?Artist Pais, PTA ?09/26/2021, 6:14 PM ? ?[place] ?[facility] ?[address] ?[place], [place], [address] ?Phone: [phone]   Fax:  [phone] ? ?Name: Alex Boyle [facility] ?MRN: [id number] ?Date of Birth: September 12, 1945 ? ? ? ?

## 2021-10-04 ENCOUNTER — Encounter: Payer: Self-pay | Admitting: Physical Therapy

## 2021-10-04 ENCOUNTER — Ambulatory Visit: Payer: Federal, State, Local not specified - PPO | Admitting: Physical Therapy

## 2021-10-04 DIAGNOSIS — M6281 Muscle weakness (generalized): Secondary | ICD-10-CM

## 2021-10-04 DIAGNOSIS — M5459 Other low back pain: Secondary | ICD-10-CM | POA: Diagnosis not present

## 2021-10-04 DIAGNOSIS — M545 Low back pain, unspecified: Secondary | ICD-10-CM | POA: Diagnosis not present

## 2021-10-04 DIAGNOSIS — R293 Abnormal posture: Secondary | ICD-10-CM | POA: Diagnosis not present

## 2021-10-04 DIAGNOSIS — R252 Cramp and spasm: Secondary | ICD-10-CM | POA: Diagnosis not present

## 2021-10-04 DIAGNOSIS — M5441 Lumbago with sciatica, right side: Secondary | ICD-10-CM

## 2021-10-04 NOTE — Therapy (Signed)
[place] ?[facility] ?[address] ?[place], [place], [address] ?Phone: [phone]   Fax:  [phone] ? ?Physical Therapy Treatment ? ?Patient Details  ?Name: Alex Boyle ?MRN: [id number] ?Date of Birth: 1945-09-14 ?Referring Provider (PT): McElwee, lauren A. ? ? ?Encounter Date: 10/04/2021 ? ? PT End of Session - 10/04/21 1538   ? ? Visit Number 3   ? Number of Visits 12   ? Date for PT Re-Evaluation 10/30/21   ? [place] + Medicare   ? Progress Note Due on Visit 10   ? PT Start Time 1534   ? PT Stop Time 1615   ? PT Time Calculation (min) 41 min   ? Activity Tolerance Patient tolerated treatment well   ? Behavior During Therapy [facility] for tasks assessed/performed   ? ?  ?  ? ?  ? ? ?Past Medical History:  ?Diagnosis Date  ? Allergy   ? Arthritis   ? Asthma   ? COPD (chronic obstructive pulmonary disease) ([place])   ? boderline per pt  ? Diverticulitis   ? Hyperlipidemia   ? Hypertension   ? Myocardial infarction [facility])   ? Sleep apnea   ? uses bipap  ? Sleep apnea in adult   ? ? ?Past Surgical History:  ?Procedure Laterality Date  ? COLON SURGERY    ? COLONOSCOPY    ? CORONARY STENT PLACEMENT    ? x2  ? FOOT SURGERY    ? JOINT REPLACEMENT    ? knee  ? LAPAROSCOPIC TRANSABDOMINAL HERNIA    ? MANDIBLE SURGERY    ? skin cancer    ? basil cell removed on back  ? UPPER GASTROINTESTINAL ENDOSCOPY    ? ? ?There were no vitals filed for this visit. ? ? Subjective Assessment - 10/04/21 1536   ? ? Subjective "Fair to middling, I've tweaked my back a few times, but I do feel like the exercises are helping."   ? Pertinent History From MD notes on 09/04/21- Acute back pain after incorrectly lifting a heavy box.  No red flags on exam.  Will give 30 mg Toradol IM in office today.  Tizanidine as needed muscle spasms, may make him sleepy.  Continue stretches, heat, Tylenol as needed.  He is going on a flight on Thursday and is asking for some tramadol  as this is helped him in the past with injury.  PDMP reviewed.  We will send in tramadol 50 mg every 8 hours as needed #15.  We will also place a referral to physical therapy.   ? Diagnostic tests none   ? Patient Stated Goals decrease pain   ? Currently in Pain? Yes   ? Pain Score 3    ? Pain Location Back   ? Pain Orientation Right;Lower   ? ?  ?  ? ?  ? ? ? ? ? ? ? ? ? ? ? ? ? ? ? ? ? ? ? ? [place] Adult PT Treatment/Exercise - 10/04/21 0001   ? ?  ? Lumbar Exercises: Stretches  ? Lower Trunk Rotation Limitations x 10 pain free ROM   ? Pelvic Tilt 5 reps   ? Piriformis Stretch Right;Left;2 reps;20 seconds   ? Piriformis Stretch Limitations KTOS stretch   ? Figure 4 Stretch 2 reps;10 seconds   ? Figure 4 Stretch Limitations pressing knee away for hip flexor stretch   ?  ? Lumbar Exercises: Aerobic  ?  Nustep L5 x 6 min   ?  ? Lumbar Exercises: Supine  ? Bridge 15 reps   ? Bridge Limitations with TrA bracing, some hamstring spasms on L   ?  ? Manual Therapy  ? Manual Therapy Soft tissue mobilization;Myofascial release;Joint mobilization   ? Manual therapy comments to decrease muscle spasm and pain   ? Joint Mobilization UPA mobs lumbar spine grade 3/4 right   ? Soft tissue mobilization STM to B lumbar paraspinals, QL, R glutes   ? Myofascial Release to R glute med   ? ?  ?  ? ?  ? ? ? ? ? ? ? ? ? ? ? ? PT Short Term Goals - 09/26/21 1754   ? ?  ? PT SHORT TERM GOAL #1  ? Title Ind with initial HEP   ? Time 2   ? Period Weeks   ? Status On-going   ? Target Date 10/02/21   ? ?  ?  ? ?  ? ? ? ? PT Long Term Goals - 09/26/21 1755   ? ?  ? PT LONG TERM GOAL #1  ? Title Pt. will be independent with progressed HEP to improve functional outcomes.   ? Time 6   ? Period Weeks   ? Status On-going   ? Target Date 10/30/21   ?  ? PT LONG TERM GOAL #2  ? Title Pt. will report 75% improvement in R sided LBP   ? Time 6   ? Period Weeks   ? Status On-going   ? Target Date 10/30/21   ?  ? PT LONG TERM GOAL #3  ? Title Pt. will score 65%  on FOTO to demonstrate improved functional ability   ? Baseline 45%   ? Time 6   ? Period Weeks   ? Status On-going   ? Target Date 10/30/21   ?  ? PT LONG TERM GOAL #4  ? Title Pt. will be able to return to PLOF without limitations from LBP.   ? Time 6   ? Period Weeks   ? Status On-going   ? Target Date 10/30/21   ? ?  ?  ? ?  ? ? ? ? ? ? ? ? Plan - 10/04/21 1637   ? ? Clinical Impression Statement Pt. reported increased spasm R side low back after lifting heavy items at home and "tweaking" it.  Reviewed and progressed exercises for core strengthening today, followed by manual therapy to low back to decrease spasm and pain.  Discussed massage therapy and use of massage gun, both would be beneficial and are ok to use.  Also discussed potential trial of dry needling next session to decrease spasm along with manual therapy.  Reported decreased tightness/pain following interventions and would benefit from continued skilled therapy.   ? Personal Factors and Comorbidities Age;Past/Current Experience;Comorbidity 3+   ? Comorbidities leg length discrepency, OA, history MI x 2, R foot surgery with fusions, L TKR   ? PT Frequency 2x / week   ? PT Duration 6 weeks   ? PT Treatment/Interventions ADLs/Self Care Home Management;Cryotherapy;Electrical Stimulation;Moist Heat;Traction;Ultrasound;Gait training;Stair training;Functional mobility training;Therapeutic activities;Therapeutic exercise;Balance training;Neuromuscular re-education;Patient/family education;Orthotic Fit/Training;Manual techniques;Passive range of motion;Dry needling;Taping;Spinal Manipulations;Joint Manipulations   ? PT Next Visit Plan review and progress HEP, manual therapy to back, consider DN   ? PT Home Exercise Plan Access Code: [email or web address]   ? Consulted and Agree with Plan of Care Patient   ? ?  ?  ? ?  ? ? ?  Patient will benefit from skilled therapeutic intervention in order to improve the following deficits and impairments:  Decreased activity tolerance,  Decreased endurance, Decreased range of motion, Decreased strength, Increased fascial restricitons, Pain, Improper body mechanics, Impaired flexibility, Increased muscle spasms, Hypomobility, Decreased mobility ? ?Visit Diagnosis: ?Acute right-sided low back pain with right-sided sciatica ? ?Cramp and spasm ? ?Abnormal posture ? ?Muscle weakness (generalized) ? ? ? ? ?Problem List ?Patient Active Problem List  ? Diagnosis Date Noted  ? Acute right-sided low back pain without sciatica 09/04/2021  ? Bronchitis 06/13/2021  ? Healthcare maintenance 11/14/2020  ? History of colon polyps 11/14/2020  ? Barrett's esophagus with dysplasia 11/14/2020  ? Ulcer of right foot, limited to breakdown of skin ([place]) 11/14/2020  ? Neuropathy 11/14/2020  ? Asthma exacerbation 04/22/2019  ? Lumbar strain 04/22/2019  ? Venous insufficiency 01/08/2019  ? Numbness and tingling of both feet 01/08/2019  ? History of basal cell cancer 01/08/2019  ? Tinea cruris 01/08/2019  ? Flank pain 11/14/2018  ? Hyperlipidemia LDL goal <70 10/13/2018  ? Essential hypertension 09/23/2018  ? CAD (coronary artery disease) 09/23/2018  ? Asthma 09/23/2018  ? OSA (obstructive sleep apnea) 09/23/2018  ? Benign prostatic hyperplasia with lower urinary tract symptoms 09/23/2018  ? ? ?Rennie Natter, PT, DPT  ?10/04/2021, 5:19 PM ? ? ?[facility] ?[address] ?[place], [place], [address] ?Phone: [phone]   Fax:  [phone] ? ?Name: Alex Boyle [facility] ?MRN: [id number] ?Date of Birth: December 21, 1945 ? ? ? ?

## 2021-10-05 ENCOUNTER — Other Ambulatory Visit: Payer: Self-pay | Admitting: Adult Health

## 2021-10-05 DIAGNOSIS — R972 Elevated prostate specific antigen [PSA]: Secondary | ICD-10-CM

## 2021-10-10 ENCOUNTER — Encounter: Payer: Self-pay | Admitting: Physical Therapy

## 2021-10-10 ENCOUNTER — Ambulatory Visit: Payer: Federal, State, Local not specified - PPO | Attending: Nurse Practitioner | Admitting: Physical Therapy

## 2021-10-10 DIAGNOSIS — R252 Cramp and spasm: Secondary | ICD-10-CM | POA: Insufficient documentation

## 2021-10-10 DIAGNOSIS — R293 Abnormal posture: Secondary | ICD-10-CM | POA: Insufficient documentation

## 2021-10-10 DIAGNOSIS — M5441 Lumbago with sciatica, right side: Secondary | ICD-10-CM | POA: Diagnosis not present

## 2021-10-10 DIAGNOSIS — M6281 Muscle weakness (generalized): Secondary | ICD-10-CM | POA: Insufficient documentation

## 2021-10-10 NOTE — Patient Instructions (Signed)
Access Code: [email or web address] ?URL: [email or web address] ?Date: 10/10/2021 ?Prepared by: Almyra Free ? ?Exercises ?- Supine Lower Trunk Rotation  - 1 x daily - 7 x weekly - 2 sets - 10 reps ?- Supine Transversus Abdominis Bracing - Hands on Stomach  - 1 x daily - 7 x weekly - 1 sets - 10 reps ?- Seated Hamstring Stretch  - 1 x daily - 7 x weekly - 1 sets - 3 reps - 30 hold ?- Supine Figure 4 Piriformis Stretch  - 1 x daily - 7 x weekly - 1 sets - 3 reps - 30 hold ?- Hooklying Single Knee to Chest Stretch  - 1 x daily - 7 x weekly - 1 sets - 3 reps - 30 hold ?- Supine Bridge  - 1 x daily - 7 x weekly - 2 sets - 10 reps ?- Sidelying Open Book Thoracic Lumbar Rotation and Extension  - 1 x daily - 7 x weekly - 1 sets - 5 reps ?- Supine Quadriceps Stretch with Strap on Table  - 2 x daily - 7 x weekly - 1 sets - 3 reps - 30-60 sec hold ?- Standing Quadratus Lumborum Mobilization with Small Ball on Wall  - 1 x daily - 7 x weekly - 1 sets - 1 reps ?- Psoas Mobilization with Small Ball  - 1 x daily - 7 x weekly - 1 sets - 1 reps ?

## 2021-10-10 NOTE — Therapy (Signed)
[facility] ?[address] ?[place], [place], [address] ?Phone: [phone]   Fax:  [phone] ? ?Physical Therapy Treatment ? ?Patient Details  ?Name: Alex Boyle ?MRN: [id number] ?Date of Birth: [birth year] ?Referring Provider (PT): McElwee, lauren A. ? ? ?Encounter Date: 10/10/2021 ? ? PT End of Session - 10/10/21 1103   ? ? Visit Number 4   ? Number of Visits 12   ? Date for PT Re-Evaluation 10/30/21   ? [place] + Medicare   ? Progress Note Due on Visit 10   ? PT Start Time 1103   ? PT Stop Time 1146   ? PT Time Calculation (min) 43 min   ? Activity Tolerance Patient tolerated treatment well   ? Behavior During Therapy [facility] for tasks assessed/performed   ? ?  ?  ? ?  ? ? ?Past Medical History:  ?Diagnosis Date  ? Allergy   ? Arthritis   ? Asthma   ? COPD (chronic obstructive pulmonary disease) ([place])   ? boderline per pt  ? Diverticulitis   ? Hyperlipidemia   ? Hypertension   ? Myocardial infarction [facility])   ? Sleep apnea   ? uses bipap  ? Sleep apnea in adult   ? ? ?Past Surgical History:  ?Procedure Laterality Date  ? COLON SURGERY    ? COLONOSCOPY    ? CORONARY STENT PLACEMENT    ? x2  ? FOOT SURGERY    ? JOINT REPLACEMENT    ? knee  ? LAPAROSCOPIC TRANSABDOMINAL HERNIA    ? MANDIBLE SURGERY    ? skin cancer    ? basil cell removed on back  ? UPPER GASTROINTESTINAL ENDOSCOPY    ? ? ?There were no vitals filed for this visit. ? ? Subjective Assessment - 10/10/21 1106   ? ? Subjective Had to lift flooring into car yesterday so a little sore   ? Patient Stated Goals decrease pain   ? Currently in Pain? Yes   ? Pain Score 3    ? Pain Location Back   ? Pain Orientation Right;Lower   ? Pain Descriptors / Indicators Aching   ? ?  ?  ? ?  ? ? ? ? ? ? ? ? ? ? ? ? ? ? ? ? ? ? ? ? [place] Adult PT Treatment/Exercise - 10/10/21 0001   ? ?  ? Self-Care  ? Self-Care Other Self-Care Comments   ? Other Self-Care Comments  MFR using ball  to bil lumbar/QL against wall x 5 min; also demo'd and returned psoas release in standing with ball   ?  ? Lumbar Exercises: Stretches  ? Hip Flexor Stretch Right;2 reps;30 seconds;Left   ? Hip Flexor Stretch Limitations seated   ? Quad Stretch Right;Left;2 reps;30 seconds   ? Quad Stretch Limitations combo psoas with strap   ? Other Lumbar Stretch Exercise open book x 5 bil with prolonged stretch at end   ?  ? Manual Therapy  ? Manual Therapy Joint mobilization;Myofascial release   ? Manual therapy comments to decrease muscle spasm and pain   ? Joint Mobilization UPA mobs lumbar spine grade 3/4 right and left; then CPA mobs to lumbar and rocking to relax tight muscles   ? Myofascial Release supine psoas release R with active hip flex x 10; SDLY R QL release   ? ?  ?  ? ?  ? ? ? ? ? ? ? ? ? ?  PT Education - 10/10/21 1155   ? ? Education Details HEP; self MFR see self care   ? Person(s) Educated Patient   ? Methods Explanation;Demonstration;Handout;Verbal cues   ? Comprehension Verbalized understanding;Returned demonstration   ? ?  ?  ? ?  ? ? ? PT Short Term Goals - 09/26/21 1754   ? ?  ? PT SHORT TERM GOAL #1  ? Title Ind with initial HEP   ? Time 2   ? Period Weeks   ? Status On-going   ? Target Date 10/02/21   ? ?  ?  ? ?  ? ? ? ? PT Long Term Goals - 09/26/21 1755   ? ?  ? PT LONG TERM GOAL #1  ? Title Pt. will be independent with progressed HEP to improve functional outcomes.   ? Time 6   ? Period Weeks   ? Status On-going   ? Target Date 10/30/21   ?  ? PT LONG TERM GOAL #2  ? Title Pt. will report 75% improvement in R sided LBP   ? Time 6   ? Period Weeks   ? Status On-going   ? Target Date 10/30/21   ?  ? PT LONG TERM GOAL #3  ? Title Pt. will score 65% on FOTO to demonstrate improved functional ability   ? Baseline 45%   ? Time 6   ? Period Weeks   ? Status On-going   ? Target Date 10/30/21   ?  ? PT LONG TERM GOAL #4  ? Title Pt. will be able to return to PLOF without limitations from LBP.   ? Time 6   ?  Period Weeks   ? Status On-going   ? Target Date 10/30/21   ? ?  ?  ? ?  ? ? ? ? ? ? ? ? Plan - 10/10/21 1153   ? ? Clinical Impression Statement Alex Boyle presents with continued c/o of R LBP. He has marked tightness in bil hip flexors and quads in addition to his lumbar. Good response to stretches, MT and self release techniques reporting increased mobility at end of session. Patient willing to try DN next visit to lumbar.   ? Comorbidities leg length discrepency, OA, history MI x 2, R foot surgery with fusions, L TKR   ? PT Frequency 2x / week   ? PT Duration 6 weeks   ? PT Treatment/Interventions ADLs/Self Care Home Management;Cryotherapy;Electrical Stimulation;Moist Heat;Traction;Ultrasound;Gait training;Stair training;Functional mobility training;Therapeutic activities;Therapeutic exercise;Balance training;Neuromuscular re-education;Patient/family education;Orthotic Fit/Training;Manual techniques;Passive range of motion;Dry needling;Taping;Spinal Manipulations;Joint Manipulations   ? PT Next Visit Plan Focus on stretching/mobility; manual therapy to back,  DN   ? PT Home Exercise Plan Access Code: [email or web address]   ? Consulted and Agree with Plan of Care Patient   ? ?  ?  ? ?  ? ? ?Patient will benefit from skilled therapeutic intervention in order to improve the following deficits and impairments:  Decreased activity tolerance, Decreased endurance, Decreased range of motion, Decreased strength, Increased fascial restricitons, Pain, Improper body mechanics, Impaired flexibility, Increased muscle spasms, Hypomobility, Decreased mobility ? ?Visit Diagnosis: ?Acute right-sided low back pain with right-sided sciatica ? ?Cramp and spasm ? ?Muscle weakness (generalized) ? ?Abnormal posture ? ? ? ? ?Problem List ?Patient Active Problem List  ? Diagnosis Date Noted  ? Acute right-sided low back pain without sciatica 09/04/2021  ? Bronchitis 06/13/2021  ? Healthcare maintenance 11/14/2020  ? History of colon polyps 11/14/2020  ?  Barrett's  esophagus with dysplasia 11/14/2020  ? Ulcer of right foot, limited to breakdown of skin ([place]) 11/14/2020  ? Neuropathy 11/14/2020  ? Asthma exacerbation 04/22/2019  ? Lumbar strain 04/22/2019  ? Venous insufficiency 01/08/2019  ? Numbness and tingling of both feet 01/08/2019  ? History of basal cell cancer 01/08/2019  ? Tinea cruris 01/08/2019  ? Flank pain 11/14/2018  ? Hyperlipidemia LDL goal <70 10/13/2018  ? Essential hypertension 09/23/2018  ? CAD (coronary artery disease) 09/23/2018  ? Asthma 09/23/2018  ? OSA (obstructive sleep apnea) 09/23/2018  ? Benign prostatic hyperplasia with lower urinary tract symptoms 09/23/2018  ? ?Madelyn Flavors, PT ?10/10/2021, 11:59 AM ? ?[facility] ?[place] ?[place], [place], [address] ?Phone: [phone]   Fax:  [phone] ? ?Name: Alex Boyle ?MRN: [id number] ?Date of Birth: 19-Sep-1945 ? ? ? ?

## 2021-10-12 ENCOUNTER — Ambulatory Visit: Payer: Federal, State, Local not specified - PPO

## 2021-10-16 ENCOUNTER — Ambulatory Visit: Payer: Federal, State, Local not specified - PPO | Admitting: Physical Therapy

## 2021-10-19 ENCOUNTER — Ambulatory Visit: Payer: Federal, State, Local not specified - PPO

## 2021-10-19 DIAGNOSIS — M6281 Muscle weakness (generalized): Secondary | ICD-10-CM | POA: Diagnosis not present

## 2021-10-19 DIAGNOSIS — R252 Cramp and spasm: Secondary | ICD-10-CM

## 2021-10-19 DIAGNOSIS — M5441 Lumbago with sciatica, right side: Secondary | ICD-10-CM

## 2021-10-19 DIAGNOSIS — R293 Abnormal posture: Secondary | ICD-10-CM

## 2021-10-19 NOTE — Therapy (Signed)
?OUTPATIENT PHYSICAL THERAPY TREATMENT NOTE ? ? ?Patient Name: Alex Boyle ?MRN: [id number] ?DOB:Dec 30, 1945, 76 y.o., male ?Today's Date: 10/19/2021 ? ?PCP: Libby Maw, MD ?REFERRING PROVIDER: Charyl Dancer, NP ? ? PT End of Session - 10/19/21 1151   ? ? Visit Number 5   ? Number of Visits 12   ? Date for PT Re-Evaluation 10/30/21   ? [place] + Medicare   ? Progress Note Due on Visit 10   ? PT Start Time 1104   ? PT Stop Time 1147   ? PT Time Calculation (min) 43 min   ? Activity Tolerance Patient tolerated treatment well   ? Behavior During Therapy [facility] for tasks assessed/performed   ? ?  ?  ? ?  ? ? ?Past Medical History:  ?Diagnosis Date  ? Allergy   ? Arthritis   ? Asthma   ? COPD (chronic obstructive pulmonary disease) ([place])   ? boderline per pt  ? Diverticulitis   ? Hyperlipidemia   ? Hypertension   ? Myocardial infarction [facility])   ? Sleep apnea   ? uses bipap  ? Sleep apnea in adult   ? ?Past Surgical History:  ?Procedure Laterality Date  ? COLON SURGERY    ? COLONOSCOPY    ? CORONARY STENT PLACEMENT    ? x2  ? FOOT SURGERY    ? JOINT REPLACEMENT    ? knee  ? LAPAROSCOPIC TRANSABDOMINAL HERNIA    ? MANDIBLE SURGERY    ? skin cancer    ? basil cell removed on back  ? UPPER GASTROINTESTINAL ENDOSCOPY    ? ?Patient Active Problem List  ? Diagnosis Date Noted  ? Acute right-sided low back pain without sciatica 09/04/2021  ? Bronchitis 06/13/2021  ? Healthcare maintenance 11/14/2020  ? History of colon polyps 11/14/2020  ? Barrett's esophagus with dysplasia 11/14/2020  ? Ulcer of right foot, limited to breakdown of skin ([place]) 11/14/2020  ? Neuropathy 11/14/2020  ? Asthma exacerbation 04/22/2019  ? Lumbar strain 04/22/2019  ? Venous insufficiency 01/08/2019  ? Numbness and tingling of both feet 01/08/2019  ? History of basal cell cancer 01/08/2019  ? Tinea cruris 01/08/2019  ? Flank pain 11/14/2018  ? Hyperlipidemia LDL goal <70 10/13/2018  ? Essential hypertension  09/23/2018  ? CAD (coronary artery disease) 09/23/2018  ? Asthma 09/23/2018  ? OSA (obstructive sleep apnea) 09/23/2018  ? Benign prostatic hyperplasia with lower urinary tract symptoms 09/23/2018  ? ? ?REFERRING DIAG: M54.50 (ICD-10-CM) - Acute right-sided low back pain without sciatica  ? ?THERAPY DIAG:  ?Acute right-sided low back pain with right-sided sciatica ? ?Cramp and spasm ? ?Muscle weakness (generalized) ? ?Abnormal posture ? ?PERTINENT HISTORY: From MD notes on 09/04/21- Acute back pain after incorrectly lifting a heavy box.  No red flags on exam.  Will give 30 mg Toradol IM in office today.  Tizanidine as needed muscle spasms, may make him sleepy.  Continue stretches, heat, Tylenol as needed.  He is going on a flight on Thursday and is asking for some tramadol as this is helped him in the past with injury.  PDMP reviewed.  We will send in tramadol 50 mg every 8 hours as needed #15.  We will also place a referral to physical therapy. ? ?PRECAUTIONS: None ? ?SUBJECTIVE: Pt reports more flexibility in R hip with dressing himself, only a little pain this morning. ? ?PAIN:  ?Are you having pain? Yes: NPRS scale: 1/10 ?Pain location:  R hip ?Pain description: dull, constant ?Aggravating factors: move wrong, twist ?Relieving factors: hot baths, muscle relaxors, tramadol, exercises  ? ? ?OBJECTIVE: (objective measures completed at initial evaluation unless otherwise dated) ? Prior Function  ?  Level of Independence Independent   ?  Vocation Retired   ?  Leisure walking with wife   ?     ?  Cognition  ?  Overall Cognitive Status Within Functional Limits for tasks assessed   ?     ?  Observation/Other Assessments  ?  Observations enters independently, noted very slow and guarded transitioning from sitting to standing,   ?  Focus on Therapeutic Outcomes (FOTO)  lumbar 45%; predicted outcome 65% after 11 visits   ?     ?  Posture/Postural Control  ?  Posture/Postural Control Postural limitations   ?  Postural  Limitations Rounded Shoulders;Forward head;Decreased lumbar lordosis;Weight shift right   ?  Posture Comments noted leg length discrepency R LE shorter, R hip lower in standing   ?     ?  ROM / Strength  ?  AROM / PROM / Strength AROM;Strength;PROM   ?     ?  AROM  ?  Overall AROM  Deficits;Due to pain   ?  Overall AROM Comments slow guarded movements, increased R sided low back pain with all lumbar movements   ?  AROM Assessment Site Lumbar   ?  Lumbar Flexion limited 75%   ?  Lumbar Extension limited 75%   ?  Lumbar - Right Side Bend to mid thigh   ?  Lumbar - Left Side Bend close to knee   ?  Lumbar - Right Rotation limited 50%   ?  Lumbar - Left Rotation limited 50%   ?     ?  PROM  ?  Overall PROM  Deficits   ?  Overall PROM Comments significant tightness bil hips, R slightly tighter than L   ?  PROM Assessment Site Hip   ?  Right/Left Hip Right;Left   ?  Right Hip External Rotation  40   ?  Right Hip Internal Rotation  15   ?  Left Hip External Rotation  45   ?  Left Hip Internal Rotation  15   ?     ?  Strength  ?  Overall Strength Within functional limits for tasks performed   ?  Overall Strength Comments tested in sitting   ?  Strength Assessment Site Hip;Knee;Ankle   ?  Right/Left Hip Right;Left   ?  Right Hip Flexion 5/5   ?  Right Hip Extension 5/5   ?  Right Hip ABduction 5/5   ?  Right Hip ADduction 5/5   ?  Left Hip Flexion 5/5   ?  Left Hip Extension 4+/5   ?  Left Hip ABduction 5/5   ?  Left Hip ADduction 5/5   ?  Right/Left Knee Right;Left   ?  Right Knee Flexion 5/5   ?  Right Knee Extension 5/5   ?  Left Knee Flexion 5/5   ?  Left Knee Extension 5/5   ?  Right/Left Ankle Right;Left   ?  Right Ankle Dorsiflexion 5/5   ?  Left Ankle Dorsiflexion 5/5   ?     ?  Flexibility  ?  Soft Tissue Assessment /Muscle Length yes   ?  Hamstrings tightness bil   ?     ?  Palpation  ?  Spinal mobility hypomobility throughout lumbar spine   ?  Palpation comment tenderness R sided lumbar paraspinals, QL, glut med  and piriformis   ?     ?  Special Tests  ?  Other special tests Neg SLR bil   ? ? ?TODAY'S TREATMENT:  ? ?10/19/21: ?Therapeutic Exercise: to improve strength and mobility.  Verbal and tactile cues throughout for technique.  ? ?Bike L5x21min ? ?TrA brace with marches x 10; progressed to holding up LE 2x20 sec ? ?Figure 4 piriformis stretch in supine 30 sec hold bil ? ?D2 flexion with red weighted ball x 10 in supine ? ?Seated lumbar rollout with green pball 2 way x 30 sec each way (fwd and L rotation) ? ?Seated marches x 10 bil ? ? ? ? ? ? ?PATIENT EDUCATION: ?Education details: HEP ?Person educated: Patient ?Education method: Explanation ?Education comprehension: verbalized understanding ? ? ?HOME EXERCISE PROGRAM: ?  ?Access Code: [email or web address] ? ?ASSESSMENT: ?Clinical Impression:  ?Pt showed a good response to the treatment today. TE was the main focus today to improve mobility and strength of the core. He is still tight in figure 4 position on both sides. He had an instance of R hip pain with seated marches but subsided after more repetitions. He still moves stiffly and guarded with exercises and ambulation. ? ? PT Short Term Goals - 09/26/21 1754   ? ?  ? PT SHORT TERM GOAL #1  ? Title Ind with initial HEP   ? Time 2   ? Period Weeks   ? Status Met  ? Target Date 10/02/21   ? ?  ?  ? ?  ? ? ? PT Long Term Goals - 09/26/21 1755   ? ?  ? PT LONG TERM GOAL #1  ? Title Pt. will be independent with progressed HEP to improve functional outcomes.   ? Time 6   ? Period Weeks   ? Status On-going   ? Target Date 10/30/21   ?  ? PT LONG TERM GOAL #2  ? Title Pt. will report 75% improvement in R sided LBP   ? Time 6   ? Period Weeks   ? Status On-going   ? Target Date 10/30/21   ?  ? PT LONG TERM GOAL #3  ? Title Pt. will score 65% on FOTO to demonstrate improved functional ability   ? Baseline 45%   ? Time 6   ? Period Weeks   ? Status On-going   ? Target Date 10/30/21   ?  ? PT LONG TERM GOAL #4  ? Title Pt. will be able to  return to PLOF without limitations from LBP.   ? Time 6   ? Period Weeks   ? Status On-going   ? Target Date 10/30/21   ? ?  ?  ? ?  ? ?Plan:  ? PT Frequency 2x / week   ?  PT Duration 6 weeks   ?  PT Treatment/Interv

## 2021-10-19 NOTE — Therapy (Deleted)
?OUTPATIENT PHYSICAL THERAPY TREATMENT NOTE ? ? ?Patient Name: Alex Boyle ?MRN: [id number] ?DOB:Sep 15, 1945, 76 y.o., male ?Today's Date: 10/19/2021 ? ?PCP: Libby Maw, MD ?REFERRING PROVIDER: Charyl Dancer, NP ? ? PT End of Session - 10/19/21 1151   ? ? Visit Number 5   ? Number of Visits 12   ? Date for PT Re-Evaluation 10/30/21   ? [place] + Medicare   ? Progress Note Due on Visit 10   ? PT Start Time 1104   ? PT Stop Time 1147   ? PT Time Calculation (min) 43 min   ? Activity Tolerance Patient tolerated treatment well   ? Behavior During Therapy [facility] for tasks assessed/performed   ? ?  ?  ? ?  ? ? ?Past Medical History:  ?Diagnosis Date  ? Allergy   ? Arthritis   ? Asthma   ? COPD (chronic obstructive pulmonary disease) ([place])   ? boderline per pt  ? Diverticulitis   ? Hyperlipidemia   ? Hypertension   ? Myocardial infarction [facility])   ? Sleep apnea   ? uses bipap  ? Sleep apnea in adult   ? ?Past Surgical History:  ?Procedure Laterality Date  ? COLON SURGERY    ? COLONOSCOPY    ? CORONARY STENT PLACEMENT    ? x2  ? FOOT SURGERY    ? JOINT REPLACEMENT    ? knee  ? LAPAROSCOPIC TRANSABDOMINAL HERNIA    ? MANDIBLE SURGERY    ? skin cancer    ? basil cell removed on back  ? UPPER GASTROINTESTINAL ENDOSCOPY    ? ?Patient Active Problem List  ? Diagnosis Date Noted  ? Acute right-sided low back pain without sciatica 09/04/2021  ? Bronchitis 06/13/2021  ? Healthcare maintenance 11/14/2020  ? History of colon polyps 11/14/2020  ? Barrett's esophagus with dysplasia 11/14/2020  ? Ulcer of right foot, limited to breakdown of skin ([place]) 11/14/2020  ? Neuropathy 11/14/2020  ? Asthma exacerbation 04/22/2019  ? Lumbar strain 04/22/2019  ? Venous insufficiency 01/08/2019  ? Numbness and tingling of both feet 01/08/2019  ? History of basal cell cancer 01/08/2019  ? Tinea cruris 01/08/2019  ? Flank pain 11/14/2018  ? Hyperlipidemia LDL goal <70 10/13/2018  ? Essential hypertension  09/23/2018  ? CAD (coronary artery disease) 09/23/2018  ? Asthma 09/23/2018  ? OSA (obstructive sleep apnea) 09/23/2018  ? Benign prostatic hyperplasia with lower urinary tract symptoms 09/23/2018  ? ? ?REFERRING DIAG: M54.50 (ICD-10-CM) - Acute right-sided low back pain without sciatica  ? ?THERAPY DIAG:  ?Acute right-sided low back pain with right-sided sciatica ? ?Cramp and spasm ? ?Muscle weakness (generalized) ? ?Abnormal posture ? ?PERTINENT HISTORY: From MD notes on 09/04/21- Acute back pain after incorrectly lifting a heavy box.  No red flags on exam.  Will give 30 mg Toradol IM in office today.  Tizanidine as needed muscle spasms, may make him sleepy.  Continue stretches, heat, Tylenol as needed.  He is going on a flight on Thursday and is asking for some tramadol as this is helped him in the past with injury.  PDMP reviewed.  We will send in tramadol 50 mg every 8 hours as needed #15.  We will also place a referral to physical therapy. ? ?PRECAUTIONS: None ? ?SUBJECTIVE: Pt reports more flexibility in R hip with dressing himself, only a little pain this morning. ? ?PAIN:  ?Are you having pain? Yes: NPRS scale: 1/10 ?Pain location:  R hip ?Pain description: dull, constant ?Aggravating factors: move wrong, twist ?Relieving factors: hot baths, muscle relaxors, tramadol, exercises  ? ? ?OBJECTIVE: (objective measures completed at initial evaluation unless otherwise dated) ? Prior Function  ?  Level of Independence Independent   ?  Vocation Retired   ?  Leisure walking with wife   ?     ?  Cognition  ?  Overall Cognitive Status Within Functional Limits for tasks assessed   ?     ?  Observation/Other Assessments  ?  Observations enters independently, noted very slow and guarded transitioning from sitting to standing,   ?  Focus on Therapeutic Outcomes (FOTO)  lumbar 45%; predicted outcome 65% after 11 visits   ?     ?  Posture/Postural Control  ?  Posture/Postural Control Postural limitations   ?  Postural  Limitations Rounded Shoulders;Forward head;Decreased lumbar lordosis;Weight shift right   ?  Posture Comments noted leg length discrepency R LE shorter, R hip lower in standing   ?     ?  ROM / Strength  ?  AROM / PROM / Strength AROM;Strength;PROM   ?     ?  AROM  ?  Overall AROM  Deficits;Due to pain   ?  Overall AROM Comments slow guarded movements, increased R sided low back pain with all lumbar movements   ?  AROM Assessment Site Lumbar   ?  Lumbar Flexion limited 75%   ?  Lumbar Extension limited 75%   ?  Lumbar - Right Side Bend to mid thigh   ?  Lumbar - Left Side Bend close to knee   ?  Lumbar - Right Rotation limited 50%   ?  Lumbar - Left Rotation limited 50%   ?     ?  PROM  ?  Overall PROM  Deficits   ?  Overall PROM Comments significant tightness bil hips, R slightly tighter than L   ?  PROM Assessment Site Hip   ?  Right/Left Hip Right;Left   ?  Right Hip External Rotation  40   ?  Right Hip Internal Rotation  15   ?  Left Hip External Rotation  45   ?  Left Hip Internal Rotation  15   ?     ?  Strength  ?  Overall Strength Within functional limits for tasks performed   ?  Overall Strength Comments tested in sitting   ?  Strength Assessment Site Hip;Knee;Ankle   ?  Right/Left Hip Right;Left   ?  Right Hip Flexion 5/5   ?  Right Hip Extension 5/5   ?  Right Hip ABduction 5/5   ?  Right Hip ADduction 5/5   ?  Left Hip Flexion 5/5   ?  Left Hip Extension 4+/5   ?  Left Hip ABduction 5/5   ?  Left Hip ADduction 5/5   ?  Right/Left Knee Right;Left   ?  Right Knee Flexion 5/5   ?  Right Knee Extension 5/5   ?  Left Knee Flexion 5/5   ?  Left Knee Extension 5/5   ?  Right/Left Ankle Right;Left   ?  Right Ankle Dorsiflexion 5/5   ?  Left Ankle Dorsiflexion 5/5   ?     ?  Flexibility  ?  Soft Tissue Assessment /Muscle Length yes   ?  Hamstrings tightness bil   ?     ?  Palpation  ?  Spinal mobility hypomobility throughout lumbar spine   ?  Palpation comment tenderness R sided lumbar paraspinals, QL, glut med  and piriformis   ?     ?  Special Tests  ?  Other special tests Neg SLR bil   ? ? ?TODAY'S TREATMENT:  ? ?10/19/21: ?Therapeutic Exercise: to improve strength and mobility.  Verbal and tactile cues throughout for technique.  ? ?Bike L5x54min ? ?TrA brace with marches x 10; progressed to holding up LE 2x20 sec ? ?Figure 4 piriformis stretch in supine 30 sec hold bil ? ?D2 flexion with red weighted ball x 10 in supine ? ?Seated lumbar rollout with green pball 2 way x 30 sec each way (fwd and L rotation) ? ?Seated marches x 10 bil ? ? ? ? ? ? ?PATIENT EDUCATION: ?Education details: *** ?Person educated: {Person educated:25204} ?Education method: {Education Method:25205} ?Education comprehension: {Education Comprehension:25206} ? ? ?HOME EXERCISE PROGRAM: ?  ?Access Code: [email or web address] ? ?ASSESSMENT: ?Clinical Impression:  ?Pt showed a good response to the treatment today. TE was the main focus today to improve mobility and strength of the core. He is still tight in figure 4 position on both sides. He had an instance of R hip pain with seated marches but subsided after more repetitions. He still moves stiffly and guarded with exercises and ambulation. ? ? PT Short Term Goals - 09/26/21 1754   ? ?  ? PT SHORT TERM GOAL #1  ? Title Ind with initial HEP   ? Time 2   ? Period Weeks   ? Status Met  ? Target Date 10/02/21   ? ?  ?  ? ?  ? ? ? PT Long Term Goals - 09/26/21 1755   ? ?  ? PT LONG TERM GOAL #1  ? Title Pt. will be independent with progressed HEP to improve functional outcomes.   ? Time 6   ? Period Weeks   ? Status On-going   ? Target Date 10/30/21   ?  ? PT LONG TERM GOAL #2  ? Title Pt. will report 75% improvement in R sided LBP   ? Time 6   ? Period Weeks   ? Status On-going   ? Target Date 10/30/21   ?  ? PT LONG TERM GOAL #3  ? Title Pt. will score 65% on FOTO to demonstrate improved functional ability   ? Baseline 45%   ? Time 6   ? Period Weeks   ? Status On-going   ? Target Date 10/30/21   ?  ? PT LONG TERM GOAL  #4  ? Title Pt. will be able to return to PLOF without limitations from LBP.   ? Time 6   ? Period Weeks   ? Status On-going   ? Target Date 10/30/21   ? ?  ?  ? ?  ? ?Plan:  ? PT Frequency 2x / week   ?  PT [place]

## 2021-10-21 ENCOUNTER — Other Ambulatory Visit: Payer: Self-pay | Admitting: Family Medicine

## 2021-10-23 ENCOUNTER — Ambulatory Visit: Payer: Federal, State, Local not specified - PPO | Admitting: Physical Therapy

## 2021-10-23 ENCOUNTER — Encounter: Payer: Self-pay | Admitting: Physical Therapy

## 2021-10-23 DIAGNOSIS — M6281 Muscle weakness (generalized): Secondary | ICD-10-CM

## 2021-10-23 DIAGNOSIS — R252 Cramp and spasm: Secondary | ICD-10-CM | POA: Diagnosis not present

## 2021-10-23 DIAGNOSIS — R293 Abnormal posture: Secondary | ICD-10-CM

## 2021-10-23 DIAGNOSIS — M5441 Lumbago with sciatica, right side: Secondary | ICD-10-CM

## 2021-10-23 NOTE — Therapy (Addendum)
OUTPATIENT PHYSICAL THERAPY TREATMENT NOTE  PHYSICAL THERAPY DISCHARGE SUMMARY  Visits from Start of Care: 6  Current functional level related to goals / functional outcomes: See below   Remaining deficits: See below   Education / Equipment: HEP  Plan: Patient goals were not met. Patient is being discharged due to not returning to therapy after 10/23/2021.  He has not been seen for 30+ days and would require new order to return to therapy if still needed.       Jena Gauss, PT, DPT 9:24 AM 11/24/2021  Patient Name: Alex Boyle MRN: [id number] DOB:[birth year], 76 y.o., male Today's Date: 10/23/2021  PCP: Mliss Sax, MD REFERRING PROVIDER: Gerre Scull, NP   PT End of Session - 10/23/21 1106     Visit Number 6    Number of Visits 12    Date for PT Re-Evaluation 10/30/21    Authorization Type Federal [employer] + Medicare    Progress Note Due on Visit 10    PT Start Time 1105    PT Stop Time 1150    PT Time Calculation (min) 45 min    Activity Tolerance Patient tolerated treatment well    Behavior During Therapy WFL for tasks assessed/performed             Past Medical History:  Diagnosis Date   Allergy    Arthritis    Asthma    COPD (chronic obstructive pulmonary disease) ([facility])    boderline per pt   Diverticulitis    Hyperlipidemia    Hypertension    Myocardial infarction ([facility])    Sleep apnea    uses bipap   Sleep apnea in adult    Past Surgical History:  Procedure Laterality Date   COLON SURGERY     COLONOSCOPY     CORONARY STENT PLACEMENT     x2   FOOT SURGERY     JOINT REPLACEMENT     knee   LAPAROSCOPIC TRANSABDOMINAL HERNIA     MANDIBLE SURGERY     skin cancer     basil cell removed on back   UPPER GASTROINTESTINAL ENDOSCOPY     Patient Active Problem List   Diagnosis Date Noted   Acute right-sided low back pain without sciatica 09/04/2021   Bronchitis 06/13/2021   Healthcare maintenance 11/14/2020   History of  colon polyps 11/14/2020   Barrett's esophagus with dysplasia 11/14/2020   Ulcer of right foot, limited to breakdown of skin ([facility]) 11/14/2020   Neuropathy 11/14/2020   Asthma exacerbation 04/22/2019   Lumbar strain 04/22/2019   Venous insufficiency 01/08/2019   Numbness and tingling of both feet 01/08/2019   History of basal cell cancer 01/08/2019   Tinea cruris 01/08/2019   Flank pain 11/14/2018   Hyperlipidemia LDL goal <70 10/13/2018   Essential hypertension 09/23/2018   CAD (coronary artery disease) 09/23/2018   Asthma 09/23/2018   OSA (obstructive sleep apnea) 09/23/2018   Benign prostatic hyperplasia with lower urinary tract symptoms 09/23/2018    REFERRING DIAG: M54.50 (ICD-10-CM) - Acute right-sided low back pain without sciatica   THERAPY DIAG:  Acute right-sided low back pain with right-sided sciatica  Cramp and spasm  Muscle weakness (generalized)  Abnormal posture  PERTINENT HISTORY: From MD notes on 09/04/21- Acute back pain after incorrectly lifting a heavy box.    PRECAUTIONS: None  SUBJECTIVE:  Pt. Reports drove to [place] this weekend, 7 hour trip with only 1 pit stop, so back a little tight.  PAIN:  Are you having pain? Yes: NPRS scale: 1-2/10 Pain location: R hip Pain description: dull, constant Aggravating factors: move wrong, twist Relieving factors: hot baths, muscle relaxors, tramadol, exercises    OBJECTIVE: (objective measures completed at initial evaluation unless otherwise dated)  Prior Function    Level of Independence Independent     Vocation Retired     Leisure walking with wife          Cognition    Overall Cognitive Status Within Functional Limits for tasks assessed          Observation/Other Assessments    Observations enters independently, noted very slow and guarded transitioning from sitting to standing,     Focus on Therapeutic Outcomes (FOTO)  lumbar 45%; predicted outcome 65% after 11 visits          Posture/Postural Control     Posture/Postural Control Postural limitations     Postural Limitations Rounded Shoulders;Forward head;Decreased lumbar lordosis;Weight shift right     Posture Comments noted leg length discrepency R LE shorter, R hip lower in standing          ROM / Strength    AROM / PROM / Strength AROM;Strength;PROM          AROM    Overall AROM  Deficits;Due to pain     Overall AROM Comments slow guarded movements, increased R sided low back pain with all lumbar movements     AROM Assessment Site Lumbar     Lumbar Flexion limited 75%     Lumbar Extension limited 75%     Lumbar - Right Side Bend to mid thigh     Lumbar - Left Side Bend close to knee     Lumbar - Right Rotation limited 50%     Lumbar - Left Rotation limited 50%          PROM    Overall PROM  Deficits     Overall PROM Comments significant tightness bil hips, R slightly tighter than L     PROM Assessment Site Hip     Right/Left Hip Right;Left     Right Hip External Rotation  40     Right Hip Internal Rotation  15     Left Hip External Rotation  45     Left Hip Internal Rotation  15          Strength    Overall Strength Within functional limits for tasks performed     Overall Strength Comments tested in sitting     Strength Assessment Site Hip;Knee;Ankle     Right/Left Hip Right;Left     Right Hip Flexion 5/5     Right Hip Extension 5/5     Right Hip ABduction 5/5     Right Hip ADduction 5/5     Left Hip Flexion 5/5     Left Hip Extension 4+/5     Left Hip ABduction 5/5     Left Hip ADduction 5/5     Right/Left Knee Right;Left     Right Knee Flexion 5/5     Right Knee Extension 5/5     Left Knee Flexion 5/5     Left Knee Extension 5/5     Right/Left Ankle Right;Left     Right Ankle Dorsiflexion 5/5     Left Ankle Dorsiflexion 5/5          Flexibility    Soft Tissue Assessment /Muscle Length yes     Hamstrings tightness bil  Palpation    Spinal mobility hypomobility throughout lumbar spine      Palpation comment tenderness R sided lumbar paraspinals, QL, glut med and piriformis          Special Tests    Other special tests Neg SLR bil     TODAY'S TREATMENT:  10/23/21: Therapeutic Exercise: to improve strength and mobility.  Verbal and tactile cues throughout for technique.  Bike L5 x 6 min warm-up/subjective Standing open books x 10 bil  Standing side glides x 10 bil  Standing back extensions x 10  Supine LTR - feet apart focusing on L rotations.  SKTC stretch 3 x 30 sec R KTOS stretch R 2 x 30 sec  Prone on elbows - very uncomfortable, brought back down to just prone Prone knee bends x 20 bil   Manual therapy -to improve mobility, decrease pain.  R PA mobs lumbar spine grade 3-4, IASTM with foam roller to lumbar paraspinals, glutes, hamstrings.     10/19/21: Therapeutic Exercise: to improve strength and mobility.  Verbal and tactile cues throughout for technique.   Bike L5x23min  TrA brace with marches x 10; progressed to holding up LE 2x20 sec  Figure 4 piriformis stretch in supine 30 sec hold bil  D2 flexion with red weighted ball x 10 in supine  Seated lumbar rollout with green pball 2 way x 30 sec each way (fwd and L rotation)  Seated marches x 10 bil       PATIENT EDUCATION: Education details: HEP update Person educated: Patient Education method: Explanation Education comprehension: verbalized understanding   HOME EXERCISE PROGRAM:   Access Code: [email or web address]  ASSESSMENT: Clinical Impression:  Alex Boyle reports continued tightness in low back.  Needed cues throughout to not push through pain.  Noted decreased lumbar and hip mobility and poor tolerance to prone positioning, given prone knee bends to start helping improve tolerance.  Also given standing extensions.  Decreased tightness reported after interventions.      PT Short Term Goals - 09/26/21 1754       PT SHORT TERM GOAL #1   Title Ind with initial HEP    Time 2    Period  Weeks    Status Met   Target Date 10/02/21              PT Long Term Goals - 09/26/21 1755       PT LONG TERM GOAL #1   Title Pt. will be independent with progressed HEP to improve functional outcomes.    Time 6    Period Weeks    Status On-going    Target Date 10/30/21      PT LONG TERM GOAL #2   Title Pt. will report 75% improvement in R sided LBP    Time 6    Period Weeks    Status On-going    Target Date 10/30/21      PT LONG TERM GOAL #3   Title Pt. will score 65% on FOTO to demonstrate improved functional ability    Baseline 45%    Time 6    Period Weeks    Status On-going    Target Date 10/30/21      PT LONG TERM GOAL #4   Title Pt. will be able to return to PLOF without limitations from LBP.    Time 6    Period Weeks    Status On-going    Target Date 10/30/21  Plan:   PT Frequency 2x / week     PT Duration 6 weeks     PT Treatment/Interventions ADLs/Self Care Home Management;Cryotherapy;Electrical Stimulation;Moist Heat;Traction;Ultrasound;Gait training;Stair training;Functional mobility training;Therapeutic activities;Therapeutic exercise;Balance training;Neuromuscular re-education;Patient/family education;Orthotic Fit/Training;Manual techniques;Passive range of motion;Dry needling;Taping;Spinal Manipulations;Joint Manipulations     PT Next Visit Plan Stretching and strengthening for lower back; manual therapy to back, consider DN        Rennie Natter, PT, DPT  10/23/2021, 11:57 AM

## 2021-10-25 ENCOUNTER — Ambulatory Visit
Admission: RE | Admit: 2021-10-25 | Discharge: 2021-10-25 | Disposition: A | Discharge status: Home / Self Care | Payer: Federal, State, Local not specified - PPO | Source: Ambulatory Visit | Attending: Adult Health | Admitting: Adult Health

## 2021-10-25 DIAGNOSIS — K5730 Diverticulosis of large intestine without perforation or abscess without bleeding: Secondary | ICD-10-CM | POA: Diagnosis not present

## 2021-10-25 DIAGNOSIS — R972 Elevated prostate specific antigen [PSA]: Secondary | ICD-10-CM

## 2021-10-25 DIAGNOSIS — K4020 Bilateral inguinal hernia, without obstruction or gangrene, not specified as recurrent: Secondary | ICD-10-CM | POA: Diagnosis not present

## 2021-10-25 DIAGNOSIS — N3289 Other specified disorders of bladder: Secondary | ICD-10-CM | POA: Diagnosis not present

## 2021-10-25 IMAGING — MR MR PROSTATE WO/W CM
12 series · 48 of 48 positions shown · IV contrast (multihance)
Comparison: None Available.

CLINICAL DATA: Elevated PSA.

EXAM:
MR PROSTATE WITHOUT AND WITH CONTRAST
TECHNIQUE: Multiplanar multisequence MRI images were obtained of the pelvis
centered about the prostate. Pre and post contrast images were
obtained.
CONTRAST:  20mL MULTIHANCE GADOBENATE DIMEGLUMINE 529 MG/ML IV SOLN

[Series 3: T2 · coronal · 3.0mm · 0.56mm/px · 1 of 23 slices shown (1 of 3)]
[im 1/23]
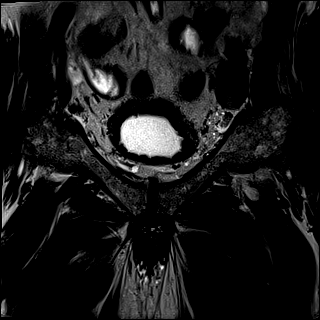

[Series 4: T1 · axial · 5.0mm · 1.25mm/px · 1 of 88 slices shown]
[im 1/88]
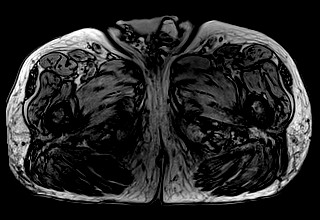

[Series 5: DWI · axial · 3.0mm · 1.75mm/px · z∈[-22,+47]mm · 2 of 72 slices shown (1 of 3)]
[im 1/72]
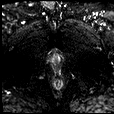
[im 72/72]
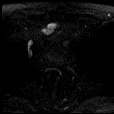

[Series 6: DWI · axial · 3.0mm · 1.75mm/px · 1 of 24 slices shown (2 of 3)]
[im 1/24]
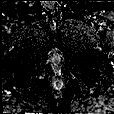

[Series 7: DWI · axial · 3.0mm · 1.75mm/px · 1 of 24 slices shown (3 of 3)]
[im 1/24]
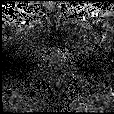

[Series 8: T2 · axial · 3.0mm · 0.56mm/px · 1 of 24 slices shown (2 of 3)]
[im 1/24]
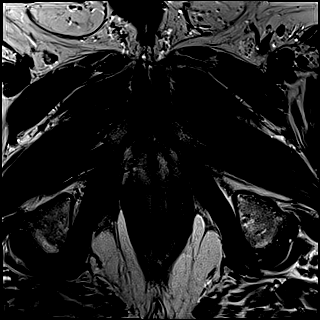

[Series 9: T2 · axial · 1.0mm · 1.04mm/px · z∈[-26,+53]mm · 2 of 80 slices shown (3 of 3)]
[im 1/80]
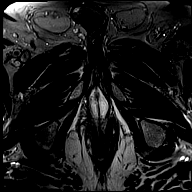
[im 80/80]
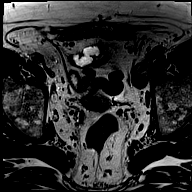

[Series 10: pre t1_twist_tra_dyn · axial · non-contrast · 3.5mm · 0.83mm/px · 1 of 22 slices shown]
[im 1/22]
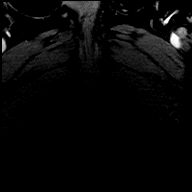

[Series 11: post t1_twist_tra_dyn-copy center · axial · non-contrast · 3.5mm · 0.83mm/px · z∈[-24,+49]mm · 17 of 660 slices shown]
[im 1/660]
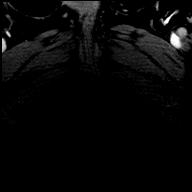
[im 42/660]
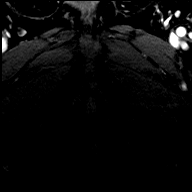
[im 83/660]
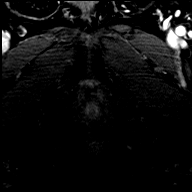
[im 124/660]
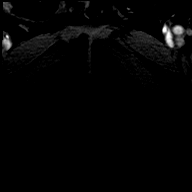
[im 165/660]
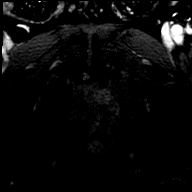
[im 206/660]
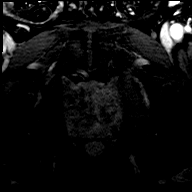
[im 248/660]
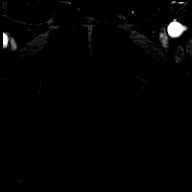
[im 289/660]
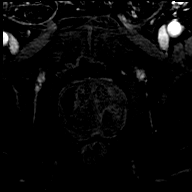
[im 330/660]
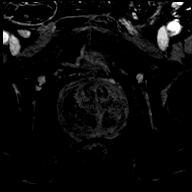
[im 371/660]
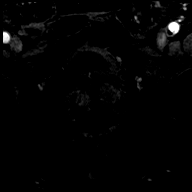
[im 412/660]
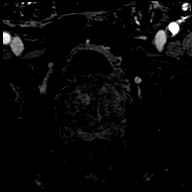
[im 454/660]
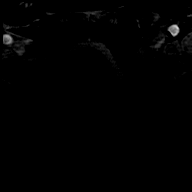
[im 495/660]
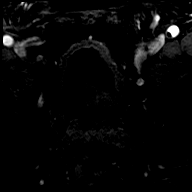
[im 536/660]
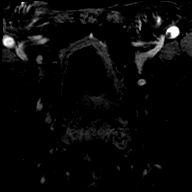
[im 577/660]
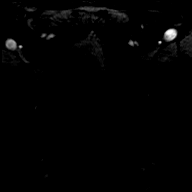
[im 618/660]
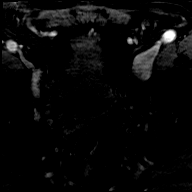
[im 660/660]
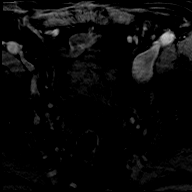

[Series 12: post t1_twist_tra_dyn-copy cent_sub · axial · 3.5mm · 0.83mm/px · z∈[-24,+49]mm · 17 of 638 slices shown]
[im 1/638]
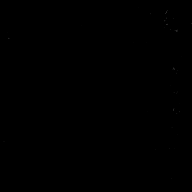
[im 40/638]
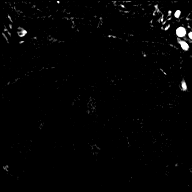
[im 80/638]
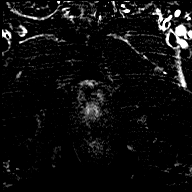
[im 120/638]
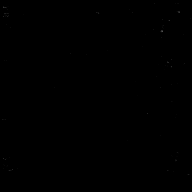
[im 160/638]
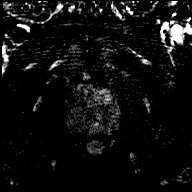
[im 200/638]
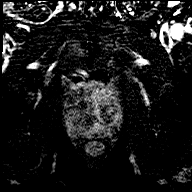
[im 239/638]
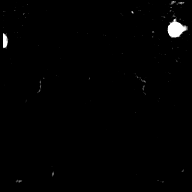
[im 279/638]
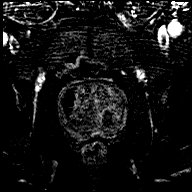
[im 319/638]
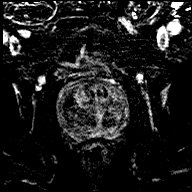
[im 359/638]
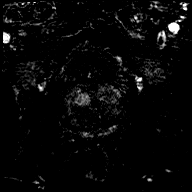
[im 399/638]
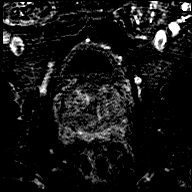
[im 438/638]
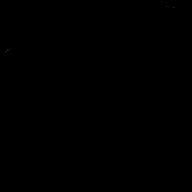
[im 478/638]
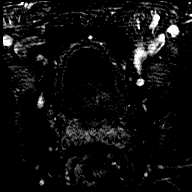
[im 518/638]
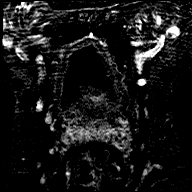
[im 558/638]
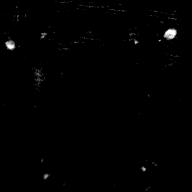
[im 598/638]
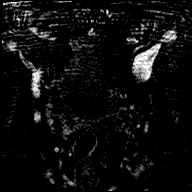
[im 638/638]
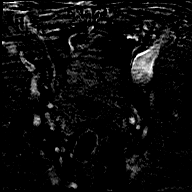

[Series 13: t1_vibe_dixon_tra_f · axial · 2.5mm · 0.91mm/px · z∈[-49,+149]mm · 2 of 80 slices shown]
[im 1/80]
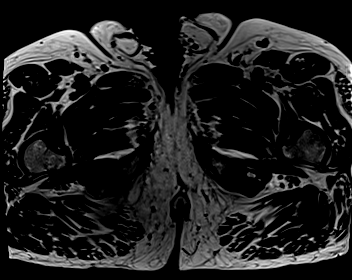
[im 80/80]
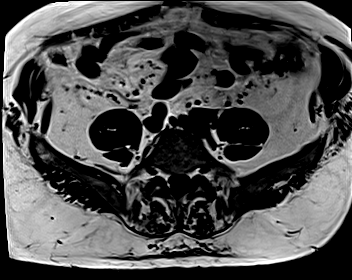

[Series 14: t1_vibe_dixon_tra_w · axial · 2.5mm · 0.91mm/px · z∈[-49,+149]mm · 2 of 80 slices shown]
[im 1/80]
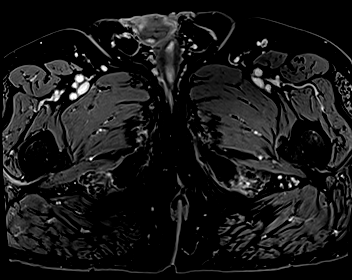
[im 80/80]
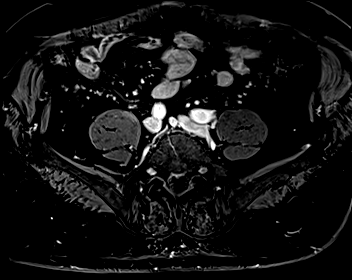

[48 of 48 positions shown; findings below may reference images not displayed]

FINDINGS: Prostate:

-- Peripheral Zone: No focal lesion seen on ADC or high b-value DWI
sequences.

-- Transition/Central Zone: Mild-to-moderately enlarged with
multiple BPH nodules. No nodules with suspicious characteristics on
T2-weighted imaging.

-- Measurements/Volume:  5.0 by 4.9 x 5.7 cm (volume = 73 cm^3)

Transcapsular spread:  Absent

Seminal vesicle involvement:  Absent

Neurovascular bundle involvement:  Absent

Pelvic adenopathy: None visualized

Bone metastasis: None visualized

Other: Diffuse bladder wall thickening, consistent with chronic
bladder outlet obstruction. Sigmoid diverticulosis, without evidence
of diverticulitis. Moderate right and small left inguinal hernias,
both containing only fat.
IMPRESSION: No radiographic evidence of high-grade prostate carcinoma. PI-RADS 1
(v2.1): Very Low (clinically significant cancer highly unlikely)

## 2021-10-25 MED ORDER — GADOBENATE DIMEGLUMINE 529 MG/ML IV SOLN
20.0000 mL | Freq: Once | INTRAVENOUS | Status: AC | PRN
  Administered 2021-10-25: 20 mL via INTRAVENOUS

## 2021-11-02 DIAGNOSIS — G4733 Obstructive sleep apnea (adult) (pediatric): Secondary | ICD-10-CM | POA: Diagnosis not present

## 2021-11-14 ENCOUNTER — Other Ambulatory Visit: Payer: Self-pay | Admitting: Family Medicine

## 2021-11-14 ENCOUNTER — Telehealth: Payer: Self-pay | Admitting: Family Medicine

## 2021-11-14 DIAGNOSIS — K22719 Barrett's esophagus with dysplasia, unspecified: Secondary | ICD-10-CM

## 2021-11-14 DIAGNOSIS — T7840XA Allergy, unspecified, initial encounter: Secondary | ICD-10-CM

## 2021-11-14 MED ORDER — MONTELUKAST SODIUM 10 MG PO TABS: 10.0000 mg | ORAL_TABLET | Freq: Every day | ORAL | 0 refills | Status: DC

## 2021-11-14 NOTE — Telephone Encounter (Signed)
  Encourage patient to contact the pharmacy for refills or they can request refills through [place]:  Please schedule appointment if longer than 1 year  NEXT APPOINTMENT DATE:  MEDICATION:montelukast (SINGULAIR) 10 MG tablet [[id number]] AND omeprazole (PRILOSEC) 20 MG capsule   Is the patient out of medication?   PHARMACY: [facility] [address] [place], [place] Phone:  [phone]      Let patient know to contact pharmacy at the end of the day to make sure medication is ready.  Please notify patient to allow 48-72 hours to process  CLINICAL FILLS OUT ALL BELOW:   LAST REFILL:  QTY:  REFILL DATE:    OTHER COMMENTS: PT STATED HE HAS ENOUGH TO LAST HIM THROUGH THE WEEKEND   Okay for refill?  Please advise

## 2021-11-24 ENCOUNTER — Other Ambulatory Visit: Payer: Self-pay | Admitting: Family Medicine

## 2021-11-24 DIAGNOSIS — I10 Essential (primary) hypertension: Secondary | ICD-10-CM

## 2021-12-07 ENCOUNTER — Telehealth: Payer: Self-pay | Admitting: Cardiology

## 2021-12-07 NOTE — Telephone Encounter (Signed)
New Message:     This patient would like to switch from Dr Radford Pax to Dr Agustin Cree please.  He wants to go to the same doctor that his wife is seeing. Is this alright?

## 2022-01-11 ENCOUNTER — Ambulatory Visit: Payer: Federal, State, Local not specified - PPO | Admitting: Cardiology

## 2022-01-19 ENCOUNTER — Other Ambulatory Visit: Payer: Self-pay | Admitting: Family Medicine

## 2022-01-24 DIAGNOSIS — R972 Elevated prostate specific antigen [PSA]: Secondary | ICD-10-CM | POA: Diagnosis not present

## 2022-01-24 DIAGNOSIS — R3915 Urgency of urination: Secondary | ICD-10-CM | POA: Diagnosis not present

## 2022-01-24 DIAGNOSIS — N401 Enlarged prostate with lower urinary tract symptoms: Secondary | ICD-10-CM | POA: Diagnosis not present

## 2022-01-24 DIAGNOSIS — N200 Calculus of kidney: Secondary | ICD-10-CM | POA: Diagnosis not present

## 2022-01-24 DIAGNOSIS — R350 Frequency of micturition: Secondary | ICD-10-CM | POA: Diagnosis not present

## 2022-02-06 ENCOUNTER — Other Ambulatory Visit: Payer: Self-pay | Admitting: Family Medicine

## 2022-02-06 DIAGNOSIS — E7800 Pure hypercholesterolemia, unspecified: Secondary | ICD-10-CM

## 2022-02-06 DIAGNOSIS — I2510 Atherosclerotic heart disease of native coronary artery without angina pectoris: Secondary | ICD-10-CM

## 2022-02-27 DIAGNOSIS — R972 Elevated prostate specific antigen [PSA]: Secondary | ICD-10-CM | POA: Diagnosis not present

## 2022-03-06 DIAGNOSIS — R972 Elevated prostate specific antigen [PSA]: Secondary | ICD-10-CM | POA: Diagnosis not present

## 2022-03-14 ENCOUNTER — Telehealth: Payer: Self-pay | Admitting: Pulmonary Disease

## 2022-03-14 DIAGNOSIS — T7840XA Allergy, unspecified, initial encounter: Secondary | ICD-10-CM

## 2022-03-14 MED ORDER — FLUTICASONE FUROATE-VILANTEROL 100-25 MCG/ACT IN AEPB: 1.0000 | INHALATION_SPRAY | Freq: Every day | RESPIRATORY_TRACT | 3 refills | Status: DC

## 2022-03-14 MED ORDER — MONTELUKAST SODIUM 10 MG PO TABS: 10.0000 mg | ORAL_TABLET | Freq: Every day | ORAL | 3 refills | Status: DC

## 2022-03-14 NOTE — Telephone Encounter (Signed)
Rx for pt's montelukast and breo have been sent to pharmacy for pt. Called and spoke with pt letting him know this had been done and he verbalized understanding. Nothing further needed.

## 2022-03-16 ENCOUNTER — Encounter: Payer: Self-pay | Admitting: Family Medicine

## 2022-03-16 ENCOUNTER — Ambulatory Visit (INDEPENDENT_AMBULATORY_CARE_PROVIDER_SITE_OTHER): Payer: Federal, State, Local not specified - PPO

## 2022-03-16 DIAGNOSIS — Z23 Encounter for immunization: Secondary | ICD-10-CM

## 2022-03-16 NOTE — Progress Notes (Signed)
Per orders of Dr. Ethelene Hal, pt is here for High dose flu vaccine. pt received vaccine in left deltoid. Given by Marcy Salvo . Pt tolerated vaccine well.

## 2022-03-23 ENCOUNTER — Encounter: Payer: Self-pay | Admitting: Pulmonary Disease

## 2022-03-23 ENCOUNTER — Ambulatory Visit (INDEPENDENT_AMBULATORY_CARE_PROVIDER_SITE_OTHER): Payer: Federal, State, Local not specified - PPO | Admitting: Pulmonary Disease

## 2022-03-23 VITALS — BP 120/70 | HR 64 | Ht 72.0 in | Wt 222.6 lb

## 2022-03-23 DIAGNOSIS — G4733 Obstructive sleep apnea (adult) (pediatric): Secondary | ICD-10-CM

## 2022-03-23 MED ORDER — FLUTICASONE FUROATE-VILANTEROL 200-25 MCG/ACT IN AEPB: 1.0000 | INHALATION_SPRAY | Freq: Every day | RESPIRATORY_TRACT | 2 refills | Status: DC

## 2022-03-23 NOTE — Progress Notes (Signed)
Subjective:    Patient ID: Alex Boyle, male    DOB: December 20, 1945, 76 y.o.   MRN: [id number]  Patient with a history of obstructive sleep apnea for many years History of asthma-controlled  Recently had issues with foot surgery that got complicated, prolonged wound healing  Starting to get back into trying to exercise on a regular basis He will be joining the [facility] for water aerobics -No significant risk with cerumen -If more congestion is noticed or wheezing,-using albuterol prior to activity may help  BiPAP has been working okay -Notices some dryness -Using a humidifier may help  Historically Relocated from [facility] He did have a sleep study performed prior to his relocation The study did reveal that he benefited from use of an auto titrating setting He was previously on a CPAP of 14 Most recent titration study titrated him to BiPAP of 23/19  Benefited well from BiPAP when he was able to use it regularly  He does have nasal stuffiness and congestion, runny nose, postnasal drip Since he has not been able to use BiPAP regularly has been trying to rest in a recliner, still wakes up with congestion and coughing from what he thinks is postnasal drip  Usually goes to bed between 930 and 11 PM Falls asleep easily Final awakening between 4 and 5 AM  Weight has fluctuated   Past Medical History:  Diagnosis Date   Allergy    Arthritis    Asthma    COPD (chronic obstructive pulmonary disease) ([place])    boderline per pt   Diverticulitis    Hyperlipidemia    Hypertension    Myocardial infarction ([place])    Sleep apnea    uses bipap   Sleep apnea in adult    Family History  Adopted: Yes  Problem Relation Age of Onset   Uterine cancer Mother    Heart disease Mother    Hypertension Mother    Arthritis Mother    Alzheimer's disease Mother    Colon cancer Neg Hx    Pancreatic cancer Neg Hx    Esophageal cancer Neg Hx    Rectal cancer Neg Hx    Stomach cancer  Neg Hx    Review of Systems  Constitutional:  Negative for fever and unexpected weight change.  HENT:  Positive for postnasal drip. Negative for congestion, dental problem, ear pain, nosebleeds, rhinorrhea, sinus pressure, sneezing, sore throat and trouble swallowing.   Eyes:  Negative for redness and itching.  Respiratory:  Positive for apnea, cough and shortness of breath. Negative for chest tightness and wheezing.   Cardiovascular:  Negative for palpitations and leg swelling.  Gastrointestinal:  Negative for nausea and vomiting.  Genitourinary:  Negative for dysuria.  Musculoskeletal:  Negative for joint swelling.  Skin:  Negative for rash.  Allergic/Immunologic: Positive for environmental allergies. Negative for food allergies and immunocompromised state.  Neurological:  Negative for headaches.  Hematological:  Does not bruise/bleed easily.  Psychiatric/Behavioral:  Positive for sleep disturbance. Negative for dysphoric mood. The patient is not nervous/anxious.        Objective:   Physical Exam Constitutional:      Appearance: He is obese.  HENT:     Head: Normocephalic and atraumatic.     Mouth/Throat:     Mouth: Mucous membranes are moist.  Cardiovascular:     Rate and Rhythm: Normal rate and regular rhythm.     Heart sounds: No murmur heard. Pulmonary:     Effort: Pulmonary effort  is normal. No respiratory distress.     Breath sounds: Normal breath sounds. No stridor. No wheezing or rhonchi.  Abdominal:     General: There is no distension.     Tenderness: There is no abdominal tenderness.  Musculoskeletal:        General: No swelling or tenderness. Normal range of motion.     Cervical back: No rigidity. No muscular tenderness.  Skin:    General: Skin is warm and dry.     Coloration: Skin is not pale.  Neurological:     Mental Status: He is alert and oriented to person, place, and time.  Psychiatric:        Mood and Affect: Mood normal.     BP 120/70 (BP  Location: Left Arm)   Pulse 64   Ht 6' (1.829 m)   Wt 222 lb 9.6 oz (101 kg)   SpO2 95%   BMI 30.19 kg/m     09/17/2019   11:50 AM 01/08/2019   11:00 AM  Results of the Epworth flowsheet  Sitting and reading 1 2  Watching TV 1 2  Sitting, inactive in a public place (e.g. a theatre or a meeting) 0 0  As a passenger in a car for an hour without a break 1 0  Lying down to rest in the afternoon when circumstances permit 3 0  Sitting and talking to someone 0 0  Sitting quietly after a lunch without alcohol 2 0  In a car, while stopped for a few minutes in traffic 0 0  Total score 8 4      Assessment & Plan:  .  Obstructive sleep apnea -Compliant with BiPAP -BiPAP is helping -Compliance data reveals 93% compliance BiPAP settings of 22/18 Residual AHI of 0.6   .  Asthma -Continue Breo 100  Cough and congestion with sputum production -May need to increase Breo to 200  .  Postnasal drip -Ipratropium nasal -Only using 1 spray as 2 sprays does cause significant dryness  Multiple allergens may also be contributing to symptoms    Plan: .  Continue BiPAP  .  Graded activities as tolerated  .  Follow-up in 6 months  .  Call with significant concerns

## 2022-03-23 NOTE — Patient Instructions (Addendum)
I will see you 6 months from here  It is okay to start graded activities, water aerobics as you can tolerate Any kind of increased activity that does not put pressure on your toes should help  You can use your albuterol about 15 to 30 minutes before activities if you notice any wheezing or tightness during activities  Prescription for higher dose of Breo can also be tried, this may help the cough and congestion/sputum production  Continue using the nasal spray  Some humidification may help the dryness  Avoid known allergens as tolerated  The BiPAP seems to be effective in controlling the sleep apnea events  Call us with significant concerns

## 2022-05-07 ENCOUNTER — Ambulatory Visit (INDEPENDENT_AMBULATORY_CARE_PROVIDER_SITE_OTHER): Payer: Federal, State, Local not specified - PPO | Admitting: Orthopedic Surgery

## 2022-05-07 ENCOUNTER — Ambulatory Visit (INDEPENDENT_AMBULATORY_CARE_PROVIDER_SITE_OTHER): Payer: Federal, State, Local not specified - PPO

## 2022-05-07 DIAGNOSIS — M79671 Pain in right foot: Secondary | ICD-10-CM

## 2022-05-07 DIAGNOSIS — I2510 Atherosclerotic heart disease of native coronary artery without angina pectoris: Secondary | ICD-10-CM

## 2022-05-11 ENCOUNTER — Ambulatory Visit (INDEPENDENT_AMBULATORY_CARE_PROVIDER_SITE_OTHER): Payer: Federal, State, Local not specified - PPO | Admitting: Family Medicine

## 2022-05-11 ENCOUNTER — Encounter: Payer: Self-pay | Admitting: Family Medicine

## 2022-05-11 VITALS — BP 122/70 | HR 70 | Temp 97.4°F | Ht 72.0 in | Wt 225.0 lb

## 2022-05-11 DIAGNOSIS — S39012A Strain of muscle, fascia and tendon of lower back, initial encounter: Secondary | ICD-10-CM | POA: Diagnosis not present

## 2022-05-11 DIAGNOSIS — K22719 Barrett's esophagus with dysplasia, unspecified: Secondary | ICD-10-CM | POA: Diagnosis not present

## 2022-05-11 MED ORDER — OMEPRAZOLE 20 MG PO CPDR: DELAYED_RELEASE_CAPSULE | ORAL | 0 refills | Status: DC

## 2022-05-11 MED ORDER — CYCLOBENZAPRINE HCL 10 MG PO TABS
10.0000 mg | ORAL_TABLET | Freq: Three times a day (TID) | ORAL | 0 refills | Status: DC | PRN
Start: 2022-05-11 — End: 2023-08-19

## 2022-05-11 MED ORDER — KETOROLAC TROMETHAMINE 60 MG/2ML IM SOLN
60.0000 mg | Freq: Once | INTRAMUSCULAR | Status: AC
  Administered 2022-05-11: 60 mg via INTRAMUSCULAR

## 2022-05-11 NOTE — Progress Notes (Signed)
Established Patient Office Visit   Subjective:  Patient ID: Alex Boyle, male    DOB: 07-Jul-1945  Age: 76 y.o. MRN: [id number]  Chief Complaint  Patient presents with   Back Pain    Pulled muscle in back pains x 3 days.     HPI reports lower back pain over the last 3 to 4 days.  Pain is located on the right and is nonradiating.  There is no change in his bowel or bladder function.  There is no paresthesias or weakness in his lower extremities.  He is experiencing painful muscle contractions.  He asked for refill of his Prilosec today.  He has a history of severe GERD and Barrett's esophagus.  His last EGD was August of last year.  He was advised to return in 5 years. Encounter Diagnoses  Name Primary?   Strain of lumbar region, initial encounter Yes   Barrett's esophagus with dysplasia      Review of Systems  Constitutional: Negative.   HENT: Negative.    Eyes:  Negative for blurred vision, discharge and redness.  Respiratory: Negative.    Cardiovascular: Negative.   Gastrointestinal:  Positive for heartburn. Negative for abdominal pain.  Genitourinary: Negative.   Musculoskeletal:  Positive for back pain and myalgias.  Skin:  Negative for rash.  Neurological:  Negative for tingling, loss of consciousness and weakness.  Endo/Heme/Allergies:  Negative for polydipsia.     Current Outpatient Medications:    albuterol (VENTOLIN HFA) 108 (90 Base) MCG/ACT inhaler, Inhale 1-2 puffs into the lungs every 6 (six) hours as needed for wheezing or shortness of breath., Disp: 1 each, Rfl: 1   ascorbic acid (VITAMIN C) 1000 MG tablet, Take by mouth., Disp: , Rfl:    B Complex Vitamins (VITAMIN-B COMPLEX PO), Take by mouth., Disp: , Rfl:    Cholecalciferol 25 MCG (1000 UT) capsule, Take by mouth., Disp: , Rfl:    clindamycin (CLEOCIN) 150 MG capsule, Indications: treatment to prevent bacterial infection of a heart valve. Take '600mg'$  as directed 1 hour prior to dental work, Disp: ,  Rfl:    cyclobenzaprine (FLEXERIL) 10 MG tablet, Take 1 tablet (10 mg total) by mouth 3 (three) times daily as needed for muscle spasms., Disp: 40 tablet, Rfl: 0   finasteride (PROSCAR) 5 MG tablet, Take 1qd (Plz sched appt with new provider for future fills), Disp: 90 tablet, Rfl: 1   fluticasone furoate-vilanterol (BREO ELLIPTA) 200-25 MCG/ACT AEPB, Inhale 1 puff into the lungs daily., Disp: 60 each, Rfl: 2   ipratropium (ATROVENT) 0.03 % nasal spray, Place 2 sprays into both nostrils every 12 (twelve) hours., Disp: 30 mL, Rfl: 6   lisinopril (ZESTRIL) 5 MG tablet, TAKE ONE TABLET BY MOUTH ONE TIME DAILY, Disp: 90 tablet, Rfl: 0   metoprolol tartrate (LOPRESSOR) 25 MG tablet, TAKE ONE TABLET BY MOUTH TWICE A DAY, Disp: 180 tablet, Rfl: 0   montelukast (SINGULAIR) 10 MG tablet, Take 1 tablet (10 mg total) by mouth at bedtime., Disp: 90 tablet, Rfl: 3   Multiple Vitamins-Minerals (MULTIVITAMIN ADULTS 50+ PO), Take by mouth., Disp: , Rfl:    nitroGLYCERIN (NITROSTAT) 0.4 MG SL tablet, Place under the tongue., Disp: , Rfl:    rosuvastatin (CRESTOR) 40 MG tablet, TAKE ONE TABLET BY MOUTH ONE TIME DAILY, Disp: 90 tablet, Rfl: 1   tamsulosin (FLOMAX) 0.4 MG CAPS capsule, Take 1qd (Plz sched appt with new provider for future fills), Disp: 90 capsule, Rfl: 0   omeprazole (PRILOSEC)  20 MG capsule, TAKE ONE CAPSULE BY MOUTH TWICE A DAY BEFORE A MEAL, Disp: 180 capsule, Rfl: 0   Objective:     BP 122/70 (BP Location: Right Arm, Patient Position: Sitting, Cuff Size: Large)   Pulse 70   Temp (!) 97.4 F (36.3 C) (Temporal)   Ht 6' (1.829 m)   Wt 225 lb (102.1 kg)   SpO2 97%   BMI 30.52 kg/m    Physical Exam Constitutional:      General: He is not in acute distress.    Appearance: Normal appearance. He is not ill-appearing, toxic-appearing or diaphoretic.  HENT:     Head: Normocephalic and atraumatic.     Right Ear: External ear normal.     Left Ear: External ear normal.  Eyes:     General:  No scleral icterus.       Right eye: No discharge.        Left eye: No discharge.     Extraocular Movements: Extraocular movements intact.     Conjunctiva/sclera: Conjunctivae normal.  Pulmonary:     Effort: Pulmonary effort is normal. No respiratory distress.  Musculoskeletal:     Lumbar back: No tenderness or bony tenderness. Decreased range of motion. Negative right straight leg raise test and negative left straight leg raise test.  Skin:    General: Skin is warm and dry.  Neurological:     Mental Status: He is alert and oriented to person, place, and time.     Motor: No weakness.     Deep Tendon Reflexes:     Reflex Scores:      Patellar reflexes are 2+ on the right side and 2+ on the left side.      Achilles reflexes are 1+ on the right side and 1+ on the left side. Psychiatric:        Mood and Affect: Mood normal.        Behavior: Behavior normal.      No results found for any visits on 05/11/22.    The 10-year ASCVD risk score (Arnett DK, et al., 2019) is: 27.6%    Assessment & Plan:   Strain of lumbar region, initial encounter -     Cyclobenzaprine HCl; Take 1 tablet (10 mg total) by mouth 3 (three) times daily as needed for muscle spasms.  Dispense: 40 tablet; Refill: 0 -     Ketorolac Tromethamine  Barrett's esophagus with dysplasia -     Omeprazole; TAKE ONE CAPSULE BY MOUTH TWICE A DAY BEFORE A MEAL  Dispense: 180 capsule; Refill: 0    Return return for physical..   We will continue hot soaks nightly.  We will start back exercises in a few days.  May use Tylenol or Advil in addition to the cyclobenzaprine.  He is aware of the sedating effect Flexeril.  He is status post recent EGD with biopsies in August of this past year. Libby Maw, MD

## 2022-05-13 ENCOUNTER — Encounter: Payer: Self-pay | Admitting: Orthopedic Surgery

## 2022-05-13 NOTE — Progress Notes (Signed)
Office Visit Note   Patient: Alex Boyle           Date of Birth: 1946/03/10           MRN: [id number] Visit Date: 05/07/2022              Requested by: Libby Maw, MD [address] [place],  [place] [address] PCP: Libby Maw, MD  Chief Complaint  Patient presents with   Right Foot - Pain      HPI: Patient is a 76 year old gentleman is seen for initial evaluation for right forefoot pain.  Patient states he is unable to put pressure on the ball of his foot.  He states the second and third toes are too close.  Patient states he has a hard knot on the ball of his foot.  Patient did have right foot surgery December 31 with Dr. Milinda Pointer.  Patient appears to have had PIP resection of the second toe and a Weil osteotomy of the second metatarsal.  Patient states he had an open wound that was treated for several months with Iodosorb and then rubbing alcohol.  Patient states that bone was exposed at the PIP joint and advised he needed surgery.  Patient went to the wound center and finally healed after several months.  Assessment & Plan: Visit Diagnoses:  1. Pain in right foot     Plan: Ulcers were debrided across the forefoot and calluses pared.  Recommended stiff soled shoes and Achilles stretching.  Follow-Up Instructions: No follow-ups on file.   Ortho Exam  Patient is alert, oriented, no adenopathy, well-dressed, normal affect, normal respiratory effort. Examination patient has dorsiflexion to neutral he has a palpable dorsalis pedis pulse he has ulceration beneath the third metatarsal head.  There is no sausage digit swelling of the second toe.  There is no cellulitis.  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  Patient states that he had a draining ulcer for about 9 months at  this location.  After informed consent a 10 blade knife was used to pare the callus and ulcer beneath the third metatarsal head and the great toe.  Both of these ulcers were 10 mm in diameter after debridement.  No exposed bone or tendon no abscess.  Imaging: No results found. No images are attached to the encounter.  Labs: Lab Results  Component Value Date   HGBA1C 5.9 11/15/2020   HGBA1C 5.8 01/06/2019   ESRSEDRATE 6 12/22/2020     Lab Results  Component Value Date   ALBUMIN 4.5 11/15/2020   ALBUMIN 4.5 04/28/2020    No results found for: "MG" No results found for: "VD25OH"  No results found for: "PREALBUMIN"    Latest Ref Rng & Units 12/22/2020   12:26 PM 11/15/2020    9:32 AM 10/31/2018   11:00 AM  CBC EXTENDED  WBC 3.8 - 10.8 Thousand/uL 7.1  7.0  6.9   RBC 4.20 - 5.80 Million/uL 5.20  5.55  5.44   Hemoglobin 13.2 - 17.1 g/dL 16.3  17.1  16.4   HCT 38.5 - 50.0 % 47.5  49.9  48.0   Platelets 140 - 400 Thousand/uL 182  183.0  193   NEUT# 1,500 - 7,800 cells/uL 4,111   4,133   Lymph# 850 - 3,900 cells/uL 2,116  1,932      There is no height or weight on file to calculate BMI.  Orders:  Orders Placed This Encounter  Procedures   XR Foot 2 Views Right   No orders of the defined types were placed in this encounter.    Procedures: No procedures performed  Clinical Data: No additional findings.  ROS:  All other systems negative, except as noted in the HPI. Review of Systems  Objective: Vital Signs: There were no vitals taken for this visit.  Specialty Comments:  No specialty comments available.  PMFS History: Patient Active Problem List   Diagnosis Date Noted   Bronchitis 06/13/2021   Healthcare maintenance 11/14/2020   History of colon polyps 11/14/2020   Barrett's esophagus with dysplasia 11/14/2020   Neuropathy 11/14/2020   Asthma exacerbation 04/22/2019   Lumbar strain 04/22/2019   Venous insufficiency 01/08/2019   Numbness and tingling of  both feet 01/08/2019   History of basal cell cancer 01/08/2019   Hyperlipidemia LDL goal <70 10/13/2018   Essential hypertension 09/23/2018   CAD (coronary artery disease) 09/23/2018   Asthma 09/23/2018   OSA (obstructive sleep apnea) 09/23/2018   Benign prostatic hyperplasia with lower urinary tract symptoms 09/23/2018   Past Medical History:  Diagnosis Date   Allergy    Arthritis    Asthma    COPD (chronic obstructive pulmonary disease) ([place])    boderline per pt   Diverticulitis    Hyperlipidemia    Hypertension    Myocardial infarction ([place])    Sleep apnea    uses bipap   Sleep apnea in adult     Family History  Adopted: Yes  Problem Relation Age of Onset   Uterine cancer Mother    Heart disease Mother    Hypertension Mother    Arthritis Mother    Alzheimer's disease Mother    Colon cancer Neg Hx    Pancreatic cancer Neg Hx    Esophageal cancer Neg Hx    Rectal cancer Neg Hx    Stomach cancer Neg Hx     Past Surgical History:  Procedure Laterality Date   COLON SURGERY     COLONOSCOPY     CORONARY STENT PLACEMENT     x2   FOOT SURGERY     JOINT REPLACEMENT     knee   LAPAROSCOPIC TRANSABDOMINAL HERNIA     MANDIBLE SURGERY     skin cancer     basil cell removed on back   UPPER GASTROINTESTINAL ENDOSCOPY     Social History   Occupational History   Not on file  Tobacco Use   Smoking status: Former    Years: 2.00    Types: Cigarettes   Smokeless tobacco: Never  Vaping Use   Vaping Use: Never used  Substance and Sexual Activity   Alcohol use: Yes    Comment: soically    Drug use: Never   Sexual activity: Yes    Birth control/protection: None

## 2022-05-15 ENCOUNTER — Encounter: Payer: Self-pay | Admitting: Cardiology

## 2022-05-15 ENCOUNTER — Ambulatory Visit: Payer: Federal, State, Local not specified - PPO | Attending: Cardiology | Admitting: Cardiology

## 2022-05-15 VITALS — BP 126/72 | HR 68 | Ht 71.75 in | Wt 221.0 lb

## 2022-05-15 DIAGNOSIS — G4733 Obstructive sleep apnea (adult) (pediatric): Secondary | ICD-10-CM

## 2022-05-15 DIAGNOSIS — E785 Hyperlipidemia, unspecified: Secondary | ICD-10-CM | POA: Diagnosis not present

## 2022-05-15 DIAGNOSIS — I10 Essential (primary) hypertension: Secondary | ICD-10-CM

## 2022-05-15 DIAGNOSIS — R0609 Other forms of dyspnea: Secondary | ICD-10-CM | POA: Diagnosis not present

## 2022-05-15 DIAGNOSIS — I2510 Atherosclerotic heart disease of native coronary artery without angina pectoris: Secondary | ICD-10-CM | POA: Diagnosis not present

## 2022-05-15 NOTE — Progress Notes (Unsigned)
Cardiology Office Note:    Date:  05/15/2022   ID:  Alex, Boyle [birth year], MRN [id number]  PCP:  Alex Maw, MD  Cardiologist:  Alex Campus, MD    Referring MD: Alex Boyle,*   Chief Complaint  Patient presents with   Establish Care    History of Present Illness:    Alex Boyle is a 76 y.o. male  with a history of ASCAD s/p remote MI with PCI x 2, HTN, hyperlipidemia, OSA and asthma.  He recently moved here from [facility].  He is s/p remote MI in 2012 at which time he had total occlusion of the LAD and underwent PCI.  He was also noted to have high-grade lesion in his left circumflex but was medically managed.  Several months later he had a follow-up nuclear stress test and collapsed on the treadmill.  He underwent PCI of the left circumflex.  His last cath was October 2016 which showed widely patent stents and no other CAD  He comes today to months for follow-up.  He would like to be established as a patient.  He have not seen cardiologist since 2020.  Overall majority of time he is doing well he did have some injury to the right leg required surgery he would slow him down a lot because of this he cannot exercise on the regular basis he complained of being weak tired exhausted of course of portion of it could be just being not active enough but concern is about potentially having some issue with his heart.  Denies have any chest pain tightness squeezing pressure burning chest but even before when he had a problem he did not have those symptoms  Past Medical History:  Diagnosis Date   Allergy    Arthritis    Asthma    COPD (chronic obstructive pulmonary disease) ([place])    boderline per pt   Diverticulitis    Hyperlipidemia    Hypertension    Myocardial infarction ([place])    Sleep apnea    uses bipap   Sleep apnea in adult     Past Surgical History:  Procedure Laterality Date   COLON SURGERY     COLONOSCOPY     CORONARY STENT  PLACEMENT     x2   FOOT SURGERY     JOINT REPLACEMENT     knee   LAPAROSCOPIC TRANSABDOMINAL HERNIA     MANDIBLE SURGERY     skin cancer     basil cell removed on back   UPPER GASTROINTESTINAL ENDOSCOPY      Current Medications: Current Meds  Medication Sig   albuterol (VENTOLIN HFA) 108 (90 Base) MCG/ACT inhaler Inhale 1-2 puffs into the lungs every 6 (six) hours as needed for wheezing or shortness of breath.   ascorbic acid (VITAMIN C) 1000 MG tablet Take 1,000 mg by mouth daily.   B Complex Vitamins (VITAMIN-B COMPLEX PO) Take 1 tablet by mouth daily.   Cholecalciferol 25 MCG (1000 UT) capsule Take 1,000 Units by mouth daily.   clindamycin (CLEOCIN) 150 MG capsule Take 150 mg by mouth as needed (Dental procedure).   cyclobenzaprine (FLEXERIL) 10 MG tablet Take 1 tablet (10 mg total) by mouth 3 (three) times daily as needed for muscle spasms.   finasteride (PROSCAR) 5 MG tablet Take 1qd (Plz sched appt with new provider for future fills) (Patient taking differently: Take 5 mg by mouth daily. Take 1qd (Plz sched appt with new provider for future fills))  fluticasone furoate-vilanterol (BREO ELLIPTA) 200-25 MCG/ACT AEPB Inhale 1 puff into the lungs daily.   ipratropium (ATROVENT) 0.03 % nasal spray Place 2 sprays into both nostrils every 12 (twelve) hours.   lisinopril (ZESTRIL) 5 MG tablet TAKE ONE TABLET BY MOUTH ONE TIME DAILY (Patient taking differently: Take 5 mg by mouth daily.)   metoprolol tartrate (LOPRESSOR) 25 MG tablet TAKE ONE TABLET BY MOUTH TWICE A DAY (Patient taking differently: Take 25 mg by mouth 2 (two) times daily.)   montelukast (SINGULAIR) 10 MG tablet Take 1 tablet (10 mg total) by mouth at bedtime.   Multiple Vitamins-Minerals (MULTIVITAMIN ADULTS 50+ PO) Take 1 tablet by mouth daily.   nitroGLYCERIN (NITROSTAT) 0.4 MG SL tablet Place 0.4 mg under the tongue every 5 (five) minutes as needed for chest pain.   omeprazole (PRILOSEC) 20 MG capsule TAKE ONE CAPSULE  BY MOUTH TWICE A DAY BEFORE A MEAL (Patient taking differently: Take 20 mg by mouth 2 (two) times daily before a meal. TAKE ONE CAPSULE BY MOUTH TWICE A DAY BEFORE A MEAL)   rosuvastatin (CRESTOR) 40 MG tablet TAKE ONE TABLET BY MOUTH ONE TIME DAILY (Patient taking differently: Take 40 mg by mouth daily.)   tamsulosin (FLOMAX) 0.4 MG CAPS capsule Take 1qd (Plz sched appt with new provider for future fills) (Patient taking differently: Take 0.4 mg by mouth daily after supper. Take 1qd (Plz sched appt with new provider for future fills))     Allergies:   Codeine, Gramineae pollens, and Penicillins   Social History   Socioeconomic History   Marital status: Married    Spouse name: Not on file   Number of children: 1   Years of education: Not on file   Highest education level: Not on file  Occupational History   Not on file  Tobacco Use   Smoking status: Former    Years: 2.00    Types: Cigarettes   Smokeless tobacco: Never  Vaping Use   Vaping Use: Never used  Substance and Sexual Activity   Alcohol use: Yes    Comment: soically    Drug use: Never   Sexual activity: Yes    Birth control/protection: None  Other Topics Concern   Not on file  Social History Narrative   Not on file   Social Determinants of Health   Financial Resource Strain: Low Risk  (09/23/2018)   Overall Financial Resource Strain (CARDIA)    Difficulty of Paying Living Expenses: Not hard at all  Food Insecurity: No Food Insecurity (09/23/2018)   Hunger Vital Sign    Worried About Running Out of Food in the Last Year: Never true    Ran Out of Food in the Last Year: Never true  Transportation Needs: No Transportation Needs (09/23/2018)   PRAPARE - Hydrologist (Medical): No    Lack of Transportation (Non-Medical): No  Physical Activity: Insufficiently Active (09/23/2018)   Exercise Vital Sign    Days of Exercise per Week: 4 days    Minutes of Exercise per Session: 30 min  Stress: No  Stress Concern Present (09/23/2018)   [place]    Feeling of Stress : Not at all  Social Connections: Not on file     Family History: The patient's family history includes Alzheimer's disease in his mother; Arthritis in his mother; Heart disease in his mother; Hypertension in his mother; Uterine cancer in his mother. There is no history of  Colon cancer, Pancreatic cancer, Esophageal cancer, Rectal cancer, or Stomach cancer. He was adopted. ROS:   Please see the history of present illness.    All 14 point review of systems negative except as described per history of present illness  EKGs/Labs/Other Studies Reviewed:      Recent Labs: No results found for requested labs within last 365 days.  Recent Lipid Panel    Component Value Date/Time   CHOL 185 11/15/2020 0932   TRIG 221.0 (H) 11/15/2020 0932   HDL 39.90 11/15/2020 0932   CHOLHDL 5 11/15/2020 0932   VLDL 44.2 (H) 11/15/2020 0932   LDLCALC 79 10/31/2018 1100   LDLDIRECT 74.0 02/14/2021 1004    Physical Exam:    VS:  There were no vitals taken for this visit.    Wt Readings from Last 3 Encounters:  05/11/22 225 lb (102.1 kg)  03/23/22 222 lb 9.6 oz (101 kg)  09/04/21 226 lb 12.8 oz (102.9 kg)     GEN:  Well nourished, well developed in no acute distress HEENT: Normal NECK: No JVD; No carotid bruits LYMPHATICS: No lymphadenopathy CARDIAC: RRR, no murmurs, no rubs, no gallops RESPIRATORY:  Clear to auscultation without rales, wheezing or rhonchi  ABDOMEN: Soft, non-tender, non-distended MUSCULOSKELETAL:  No edema; No deformity  SKIN: Warm and dry LOWER EXTREMITIES: no swelling NEUROLOGIC:  Alert and oriented x 3 PSYCHIATRIC:  Normal affect   ASSESSMENT:    1. Coronary artery disease involving native coronary artery of native heart without angina pectoris   2. Essential hypertension   3. OSA (obstructive sleep apnea)   4. Hyperlipidemia LDL  goal <70    PLAN:    In order of problems listed above:  Coronary disease.  I will ask him to have an echocardiogram to assess left ventricle ejection fraction, Lexiscan will be scheduled to make sure he does not have any inducible ischemia.  In the meantime ask him to start taking 1 baby aspirin every single day.  He stopped taking the medication years ago because he did not think it was needed. Dyslipidemia he is on high intensity statin which I will continue.  Crestor 40, last fasting lipid profile that I have is from year ago.  Will schedule him to have fasting lipid profile Obstructive sleep apnea: Use CPAP on the regular basis and he is very happy with that Essential hypertension blood pressure well-controlled   Medication Adjustments/Labs and Tests Ordered: Current medicines are reviewed at length with the patient today.  Concerns regarding medicines are outlined above.  Orders Placed This Encounter  Procedures   EKG 12-Lead   Medication changes: No orders of the defined types were placed in this encounter.   Signed, Alex Liter, MD, [facility] 05/15/2022 4:43 PM    [place]

## 2022-05-15 NOTE — Patient Instructions (Addendum)
Medication Instructions:  Your physician recommends that you continue on your current medications as directed. Please refer to the Current Medication list given to you today.  *If you need a refill on your cardiac medications before your next appointment, please call your pharmacy*   Lab Work: Your physician recommends that you return for lab work in: when you come for ECHO or when you are fasting You need to have labs done when you are fasting.  You can come    2nd Floor- [address] Monday thru Thursday:   8a-11:30a  &  1pm- 4:30pm  Friday: 8am-11:30am   You do not need to make an appointment as the order has already been placed. The labs you are going to have done are Lipid panel. AST, ALT   Testing/Procedures: Your physician has requested that you have a lexiscan myoview. For further information please visit [email or web address]. Please follow instruction sheet, as given.  The test will take approximately 3 to 4 hours to complete; you may bring reading material.  If someone comes with you to your appointment, they will need to remain in the main lobby due to limited space in the testing area. **If you are pregnant or breastfeeding, please notify the nuclear lab prior to your appointment**  How to prepare for your Myocardial Perfusion Test: Do not eat or drink 3 hours prior to your test, except you may have water. Do not consume products containing caffeine (regular or decaffeinated) 12 hours prior to your test. (ex: coffee, chocolate, sodas, tea). Do bring a list of your current medications with you.  If not listed below, you may take your medications as normal. Do wear comfortable clothes (no dresses or overalls) and walking shoes, tennis shoes preferred (No heels or open toe shoes are allowed). Do NOT wear cologne, perfume, aftershave, or lotions (deodorant is allowed). If these instructions are not followed, your test will have to be rescheduled.  Your physician has requested that you  have an echocardiogram. Echocardiography is a painless test that uses sound waves to create images of your heart. It provides your doctor with information about the size and shape of your heart and how well your heart's chambers and valves are working. This procedure takes approximately one hour. There are no restrictions for this procedure.   Follow-Up: At [facility], you and your health needs are our priority.  As part of our continuing mission to provide you with exceptional heart care, we have created designated Provider Care Teams.  These Care Teams include your primary Cardiologist (physician) and Advanced Practice Providers (APPs -  Physician Assistants and Nurse Practitioners) who all work together to provide you with the care you need, when you need it.  We recommend signing up for the patient portal called "MyChart".  Sign up information is provided on this After Visit Summary.  MyChart is used to connect with patients for Virtual Visits (Telemedicine).  Patients are able to view lab/test results, encounter notes, upcoming appointments, etc.  Non-urgent messages can be sent to your provider as well.   To learn more about what you can do with MyChart, go to [email or web address].    Your next appointment:   3 month(s)  The format for your next appointment:   In Person  Provider:   Jenne Campus, MD   Other Instructions Cardiac Nuclear Scan A cardiac nuclear scan is a test that is done to check the flow of blood to your heart. It is done when you are resting and when  you are exercising. The test looks for problems such as: Not enough blood reaching a portion of the heart. The heart muscle not working as it should. You may need this test if: You have heart disease. You have had lab results that are not normal. You have had heart surgery or a balloon procedure to open up blocked arteries (angioplasty). You have chest pain. You have shortness of breath. In this test, a  special dye (tracer) is put into your bloodstream. The tracer will travel to your heart. A camera will then take pictures of your heart to see how the tracer moves through your heart. This test is usually done at a hospital and takes 2-4 hours. Tell a doctor about: Any allergies you have. All medicines you are taking, including vitamins, herbs, eye drops, creams, and over-the-counter medicines. Any problems you or family members have had with anesthetic medicines. Any blood disorders you have. Any surgeries you have had. Any medical conditions you have. Whether you are pregnant or may be pregnant. What are the risks? Generally, this is a safe test. However, problems may occur, such as: Serious chest pain and heart attack. This is only a risk if the stress portion of the test is done. Rapid heartbeat. A feeling of warmth in your chest. This feeling usually does not last long. Allergic reaction to the tracer. What happens before the test? Ask your doctor about changing or stopping your normal medicines. This is important. Follow instructions from your doctor about what you cannot eat or drink. Remove your jewelry on the day of the test. What happens during the test? An IV tube will be inserted into one of your veins. Your doctor will give you a small amount of tracer through the IV tube. You will wait for 20-40 minutes while the tracer moves through your bloodstream. Your heart will be monitored with an electrocardiogram (ECG). You will lie down on an exam table. Pictures of your heart will be taken for about 15-20 minutes. You may also have a stress test. For this test, one of these things may be done: You will be asked to exercise on a treadmill or a stationary bike. You will be given medicines that will make your heart work harder. This is done if you are unable to exercise. When blood flow to your heart has peaked, a tracer will again be given through the IV tube. After 20-40 minutes,  you will get back on the exam table. More pictures will be taken of your heart. Depending on the tracer that is used, more pictures may need to be taken 3-4 hours later. Your IV tube will be removed when the test is over. The test may vary among doctors and hospitals. What happens after the test? Ask your doctor: Whether you can return to your normal schedule, including diet, activities, and medicines. Whether you should drink more fluids. This will help to remove the tracer from your body. Drink enough fluid to keep your pee (urine) pale yellow. Ask your doctor, or the department that is doing the test: When will my results be ready? How will I get my results? Summary A cardiac nuclear scan is a test that is done to check the flow of blood to your heart. Tell your doctor whether you are pregnant or may be pregnant. Before the test, ask your doctor about changing or stopping your normal medicines. This is important. Ask your doctor whether you can return to your normal activities. You may be asked  to drink more fluids. This information is not intended to replace advice given to you by your health care provider. Make sure you discuss any questions you have with your health care provider. Document Revised: 09/17/2018 Document Reviewed: 11/11/2017 Elsevier Patient Education  2021 [employer].    Echocardiogram An echocardiogram is a test that uses sound waves (ultrasound) to produce images of the heart. Images from an echocardiogram can provide important information about: Heart size and shape. The size and thickness and movement of your heart's walls. Heart muscle function and strength. Heart valve function or if you have stenosis. Stenosis is when the heart valves are too narrow. If blood is flowing backward through the heart valves (regurgitation). A tumor or infectious growth around the heart valves. Areas of heart muscle that are not working well because of poor blood flow or  injury from a heart attack. Aneurysm detection. An aneurysm is a weak or damaged part of an artery wall. The wall bulges out from the normal force of blood pumping through the body. Tell a health care provider about: Any allergies you have. All medicines you are taking, including vitamins, herbs, eye drops, creams, and over-the-counter medicines. Any blood disorders you have. Any surgeries you have had. Any medical conditions you have. Whether you are pregnant or may be pregnant. What are the risks? Generally, this is a safe test. However, problems may occur, including an allergic reaction to dye (contrast) that may be used during the test. What happens before the test? No specific preparation is needed. You may eat and drink normally. What happens during the test? You will take off your clothes from the waist up and put on a hospital gown. Electrodes or electrocardiogram (ECG)patches may be placed on your chest. The electrodes or patches are then connected to a device that monitors your heart rate and rhythm. You will lie down on a table for an ultrasound exam. A gel will be applied to your chest to help sound waves pass through your skin. A handheld device, called a transducer, will be pressed against your chest and moved over your heart. The transducer produces sound waves that travel to your heart and bounce back (or "echo" back) to the transducer. These sound waves will be captured in real-time and changed into images of your heart that can be viewed on a video monitor. The images will be recorded on a computer and reviewed by your health care provider. You may be asked to change positions or hold your breath for a short time. This makes it easier to get different views or better views of your heart. In some cases, you may receive contrast through an IV in one of your veins. This can improve the quality of the pictures from your heart. The procedure may vary among health care providers and  hospitals.    What can I expect after the test? You may return to your normal, everyday life, including diet, activities, and medicines, unless your health care provider tells you not to do that. Follow these instructions at home: It is up to you to get the results of your test. Ask your health care provider, or the department that is doing the test, when your results will be ready. Keep all follow-up visits. This is important. Summary An echocardiogram is a test that uses sound waves (ultrasound) to produce images of the heart. Images from an echocardiogram can provide important information about the size and shape of your heart, heart muscle function, heart valve  function, and other possible heart problems. You do not need to do anything to prepare before this test. You may eat and drink normally. After the echocardiogram is completed, you may return to your normal, everyday life, unless your health care provider tells you not to do that. This information is not intended to replace advice given to you by your health care provider. Make sure you discuss any questions you have with your health care provider. Document Revised: 01/19/2020 Document Reviewed: 01/19/2020 Elsevier Patient Education  2021 [employer].

## 2022-05-16 ENCOUNTER — Telehealth (HOSPITAL_COMMUNITY): Payer: Self-pay | Admitting: *Deleted

## 2022-05-16 NOTE — Telephone Encounter (Signed)
Left message on voicemail per DPR in reference to upcoming appointment scheduled on 05/16/22 with detailed instructions given per Myocardial Perfusion Study Information Sheet for the test. LM to arrive 15 minutes early, and that it is imperative to arrive on time for appointment to keep from having the test rescheduled. If you need to cancel or reschedule your appointment, please call the office within 24 hours of your appointment. Failure to do so may result in a cancellation of your appointment, and a $50 no show fee. Phone number given for call back for any questions. Kirstie Peri

## 2022-05-16 NOTE — Addendum Note (Signed)
Addended by: Jenne Campus on: 05/16/2022 04:13 PM   Modules accepted: Orders

## 2022-05-16 NOTE — Addendum Note (Signed)
Addended by: Jacobo Forest D on: 05/16/2022 02:42 PM   Modules accepted: Orders

## 2022-05-17 ENCOUNTER — Ambulatory Visit (HOSPITAL_COMMUNITY): Payer: Federal, State, Local not specified - PPO | Attending: Internal Medicine

## 2022-05-17 DIAGNOSIS — R0609 Other forms of dyspnea: Secondary | ICD-10-CM

## 2022-05-17 DIAGNOSIS — I2510 Atherosclerotic heart disease of native coronary artery without angina pectoris: Secondary | ICD-10-CM

## 2022-05-17 LAB — MYOCARDIAL PERFUSION IMAGING
LV dias vol: 80 mL (ref 62–150)
LV sys vol: 37 mL
Nuc Stress EF: 53 %
Peak HR: 111 {beats}/min
Rest HR: 83 {beats}/min
Rest Nuclear Isotope Dose: 10.8 mCi
SDS: 2
SRS: 0
SSS: 2
ST Depression (mm): 0 mm
Stress Nuclear Isotope Dose: 31 mCi
TID: 1.09

## 2022-05-17 MED ORDER — TECHNETIUM TC 99M TETROFOSMIN IV KIT
31.0000 | PACK | Freq: Once | INTRAVENOUS | Status: AC | PRN
  Administered 2022-05-17: 31 via INTRAVENOUS

## 2022-05-17 MED ORDER — TECHNETIUM TC 99M TETROFOSMIN IV KIT
10.8000 | PACK | Freq: Once | INTRAVENOUS | Status: AC | PRN
  Administered 2022-05-17: 10.8 via INTRAVENOUS

## 2022-05-17 MED ORDER — REGADENOSON 0.4 MG/5ML IV SOLN
0.4000 mg | Freq: Once | INTRAVENOUS | Status: AC
  Administered 2022-05-17: 0.4 mg via INTRAVENOUS

## 2022-06-18 ENCOUNTER — Ambulatory Visit (HOSPITAL_BASED_OUTPATIENT_CLINIC_OR_DEPARTMENT_OTHER)
Admission: RE | Admit: 2022-06-18 | Discharge: 2022-06-18 | Disposition: A | Discharge status: Home / Self Care | Payer: Federal, State, Local not specified - PPO | Source: Ambulatory Visit | Attending: Cardiology | Admitting: Cardiology

## 2022-06-18 DIAGNOSIS — I2510 Atherosclerotic heart disease of native coronary artery without angina pectoris: Secondary | ICD-10-CM | POA: Insufficient documentation

## 2022-06-18 DIAGNOSIS — R072 Precordial pain: Secondary | ICD-10-CM | POA: Diagnosis not present

## 2022-06-18 DIAGNOSIS — R0609 Other forms of dyspnea: Secondary | ICD-10-CM

## 2022-06-18 DIAGNOSIS — R600 Localized edema: Secondary | ICD-10-CM

## 2022-06-18 LAB — ECHOCARDIOGRAM COMPLETE
AR max vel: 2.04 cm2
AV Area VTI: 2.42 cm2
AV Area mean vel: 2.45 cm2
AV Mean grad: 3 mmHg
AV Peak grad: 7.4 mmHg
Ao pk vel: 1.36 m/s
Area-P 1/2: 3.53 cm2
S' Lateral: 3 cm
Single Plane A4C EF: 59.7 %

## 2022-06-19 DIAGNOSIS — D225 Melanocytic nevi of trunk: Secondary | ICD-10-CM | POA: Diagnosis not present

## 2022-06-19 DIAGNOSIS — L814 Other melanin hyperpigmentation: Secondary | ICD-10-CM | POA: Diagnosis not present

## 2022-06-19 DIAGNOSIS — L718 Other rosacea: Secondary | ICD-10-CM | POA: Diagnosis not present

## 2022-06-19 DIAGNOSIS — C44619 Basal cell carcinoma of skin of left upper limb, including shoulder: Secondary | ICD-10-CM | POA: Diagnosis not present

## 2022-06-19 DIAGNOSIS — D485 Neoplasm of uncertain behavior of skin: Secondary | ICD-10-CM | POA: Diagnosis not present

## 2022-06-19 DIAGNOSIS — L821 Other seborrheic keratosis: Secondary | ICD-10-CM | POA: Diagnosis not present

## 2022-06-21 ENCOUNTER — Telehealth: Payer: Self-pay

## 2022-06-21 ENCOUNTER — Other Ambulatory Visit: Payer: Self-pay | Admitting: Family Medicine

## 2022-06-21 ENCOUNTER — Telehealth: Payer: Self-pay | Admitting: Pulmonary Disease

## 2022-06-21 DIAGNOSIS — I10 Essential (primary) hypertension: Secondary | ICD-10-CM

## 2022-06-21 DIAGNOSIS — E785 Hyperlipidemia, unspecified: Secondary | ICD-10-CM | POA: Diagnosis not present

## 2022-06-21 MED ORDER — METOPROLOL TARTRATE 25 MG PO TABS: 25.0000 mg | ORAL_TABLET | Freq: Two times a day (BID) | ORAL | 0 refills | Status: DC

## 2022-06-21 MED ORDER — LISINOPRIL 5 MG PO TABS: 5.0000 mg | ORAL_TABLET | Freq: Every day | ORAL | 0 refills | Status: DC

## 2022-06-21 NOTE — Telephone Encounter (Signed)
Called and left voicemail for patient to call office back in regards to [facility] inhaler

## 2022-06-21 NOTE — Telephone Encounter (Signed)
Results reviewed with pt as per Dr. Krasowski's note.  Pt verbalized understanding and had no additional questions. Routed to PCP  

## 2022-06-21 NOTE — Telephone Encounter (Signed)
Patient is returning a call to the nurse.  He missed call and would like a call back at [phone]

## 2022-06-21 NOTE — Telephone Encounter (Signed)
Spk to patient advised him on last note from office visit   Asthma -Continue Breo 100   Cough and congestion with sputum production -May need to increase Breo to 200

## 2022-06-21 NOTE — Telephone Encounter (Signed)
Patient would like the nurse to call regarding his prescription for his inhaler.  He stated that the pharmacy filled the lesser dosage and he though the doctor had increased the dosage.  Please call patient to discuss.  CB# [phone]

## 2022-06-21 NOTE — Telephone Encounter (Signed)
Chart supports Rx Last OV: 05/2022 Next OV: not scheduled

## 2022-06-22 LAB — ALT: ALT: 35 IU/L (ref 0–44)

## 2022-06-22 LAB — LIPID PANEL
Chol/HDL Ratio: 3.6 ratio (ref 0.0–5.0)
Cholesterol, Total: 142 mg/dL (ref 100–199)
HDL: 39 mg/dL — ABNORMAL LOW (ref >39)
LDL Chol Calc (NIH): 74 mg/dL (ref 0–99)
Triglycerides: 172 mg/dL — ABNORMAL HIGH (ref 0–149)
VLDL Cholesterol Cal: 29 mg/dL (ref 5–40)

## 2022-06-22 LAB — AST: AST: 30 IU/L (ref 0–40)

## 2022-06-27 ENCOUNTER — Other Ambulatory Visit: Payer: Self-pay | Admitting: Pulmonary Disease

## 2022-07-16 ENCOUNTER — Other Ambulatory Visit (HOSPITAL_BASED_OUTPATIENT_CLINIC_OR_DEPARTMENT_OTHER): Payer: Self-pay

## 2022-07-16 ENCOUNTER — Emergency Department (HOSPITAL_BASED_OUTPATIENT_CLINIC_OR_DEPARTMENT_OTHER): Payer: Federal, State, Local not specified - PPO

## 2022-07-16 ENCOUNTER — Other Ambulatory Visit: Payer: Self-pay

## 2022-07-16 ENCOUNTER — Emergency Department (HOSPITAL_BASED_OUTPATIENT_CLINIC_OR_DEPARTMENT_OTHER)
Admission: EM | Admit: 2022-07-16 | Discharge: 2022-07-16 | Disposition: A | Discharge status: Home / Self Care | Payer: Federal, State, Local not specified - PPO | Attending: Emergency Medicine | Admitting: Emergency Medicine

## 2022-07-16 DIAGNOSIS — J449 Chronic obstructive pulmonary disease, unspecified: Secondary | ICD-10-CM | POA: Insufficient documentation

## 2022-07-16 DIAGNOSIS — R0602 Shortness of breath: Secondary | ICD-10-CM | POA: Diagnosis not present

## 2022-07-16 DIAGNOSIS — R059 Cough, unspecified: Secondary | ICD-10-CM | POA: Diagnosis not present

## 2022-07-16 DIAGNOSIS — I10 Essential (primary) hypertension: Secondary | ICD-10-CM | POA: Insufficient documentation

## 2022-07-16 DIAGNOSIS — I2510 Atherosclerotic heart disease of native coronary artery without angina pectoris: Secondary | ICD-10-CM | POA: Diagnosis not present

## 2022-07-16 DIAGNOSIS — U071 COVID-19: Secondary | ICD-10-CM

## 2022-07-16 LAB — CBC WITH DIFFERENTIAL/PLATELET
Abs Immature Granulocytes: 0.02 10*3/uL (ref 0.00–0.07)
Basophils Absolute: 0 10*3/uL (ref 0.0–0.1)
Basophils Relative: 1 %
Eosinophils Absolute: 0 10*3/uL (ref 0.0–0.5)
Eosinophils Relative: 1 %
HCT: 49.8 % (ref 39.0–52.0)
Hemoglobin: 17.1 g/dL — ABNORMAL HIGH (ref 13.0–17.0)
Immature Granulocytes: 0 %
Lymphocytes Relative: 11 %
Lymphs Abs: 0.6 10*3/uL — ABNORMAL LOW (ref 0.7–4.0)
MCH: 30.3 pg (ref 26.0–34.0)
MCHC: 34.3 g/dL (ref 30.0–36.0)
MCV: 88.1 fL (ref 80.0–100.0)
Monocytes Absolute: 1.2 10*3/uL — ABNORMAL HIGH (ref 0.1–1.0)
Monocytes Relative: 21 %
Neutro Abs: 3.8 10*3/uL (ref 1.7–7.7)
Neutrophils Relative %: 66 %
Platelets: 151 10*3/uL (ref 150–400)
RBC: 5.65 MIL/uL (ref 4.22–5.81)
RDW: 13 % (ref 11.5–15.5)
WBC: 5.7 10*3/uL (ref 4.0–10.5)
nRBC: 0 % (ref 0.0–0.2)

## 2022-07-16 LAB — BASIC METABOLIC PANEL
Anion gap: 7 (ref 5–15)
BUN: 11 mg/dL (ref 8–23)
CO2: 24 mmol/L (ref 22–32)
Calcium: 9.2 mg/dL (ref 8.9–10.3)
Chloride: 104 mmol/L (ref 98–111)
Creatinine, Ser: 0.84 mg/dL (ref 0.61–1.24)
GFR, Estimated: >60 mL/min (ref >60)
Glucose, Bld: 140 mg/dL — ABNORMAL HIGH (ref 70–99)
Potassium: 3.9 mmol/L (ref 3.5–5.1)
Sodium: 135 mmol/L (ref 135–145)

## 2022-07-16 LAB — RESP PANEL BY RT-PCR (RSV, FLU A&B, COVID)  RVPGX2
Influenza A by PCR: NEGATIVE
Influenza B by PCR: NEGATIVE
Resp Syncytial Virus by PCR: NEGATIVE
SARS Coronavirus 2 by RT PCR: POSITIVE — AB

## 2022-07-16 LAB — TROPONIN I (HIGH SENSITIVITY): Troponin I (High Sensitivity): 4 ng/L (ref <18)

## 2022-07-16 MED ORDER — PAXLOVID (300/100) 20 X 150 MG & 10 X 100MG PO TBPK
3.0000 | ORAL_TABLET | Freq: Two times a day (BID) | ORAL | 0 refills | Status: AC
  Filled 2022-07-16: qty 30, 5d supply, fill #0

## 2022-07-16 MED ORDER — SODIUM CHLORIDE 0.9 % IV BOLUS
500.0000 mL | Freq: Once | INTRAVENOUS | Status: AC
  Administered 2022-07-16: 500 mL via INTRAVENOUS

## 2022-07-16 NOTE — ED Triage Notes (Signed)
Pt with cough/congestion, and headache since last night    Hx of asthma/COPD.  Took a hot shower and then felt sweaty then cold.

## 2022-07-16 NOTE — Discharge Instructions (Addendum)
Please follow virtual with your primary care doctor by phone.  If you develop any new or suddenly worsening symptoms he should return to the emergency department.  Please start the medication as prescribed.  You will need to remain in quarantine for at least the next 5 days.

## 2022-07-16 NOTE — ED Notes (Signed)
RT assessed patient at this time. Patient has history of COPD. BBS clear, SAT 94%. RN stated he was 91% at the lowest.

## 2022-07-16 NOTE — ED Notes (Signed)
Orthostatic vitals completed per order. Pt reports no dizziness when changing positions, still complaining of headache. RN notified.

## 2022-07-16 NOTE — ED Provider Notes (Signed)
Emergency Department Provider Note   I have reviewed the triage vital signs and the nursing notes.   HISTORY  Chief Complaint Cough and Headache   HPI Alex Boyle is a 77 y.o. male past history of hypertension, hyperlipidemia, CAD, COPD presents to the emergency department with cough/congestion with tightness in the chest.  He had multiple coughing episodes last night which kept him from sleeping.  He is feeling very fatigued this morning.  No vomiting, diarrhea, abdominal pain.  He has a moderate headache which was gradual in onset.  His coughing has improved this morning but after showering had a brief episode of lightheadedness and diaphoresis where he thought he may pass out.  He did not experience a full syncope event and has since felt improved but is here today for evaluation. No sick contacts.    Past Medical History:  Diagnosis Date   Allergy    Arthritis    Asthma    COPD (chronic obstructive pulmonary disease) ([place])    boderline per pt   Diverticulitis    Hyperlipidemia    Hypertension    Myocardial infarction [facility])    Sleep apnea    uses bipap   Sleep apnea in adult     Review of Systems  Constitutional: No fever/chills. Positive generalized weakness.  Cardiovascular: Positive chest pain, mainly with coughing. Lightheadedness and diaphoresis this AM.  Respiratory: Mild shortness of breath. Gastrointestinal: No abdominal pain.  No nausea, no vomiting.  No diarrhea.  No constipation. Genitourinary: Negative for dysuria. Musculoskeletal: Negative for back pain. Skin: Negative for rash. Neurological: Negative for headaches, focal weakness or numbness.  ____________________________________________   PHYSICAL EXAM:  VITAL SIGNS: ED Triage Vitals [07/16/22 0925]  Enc Vitals Group     BP 135/79     Pulse Rate 76     Resp 18     Temp 98.2 F (36.8 C)     Temp Source Oral     SpO2 97 %   Constitutional: Alert and oriented. Well appearing and  in no acute distress. Eyes: Conjunctivae are normal.  Head: Atraumatic. Nose: Positive congestion/rhinnorhea. Neck: No stridor.   Cardiovascular: Normal rate, regular rhythm. Good peripheral circulation. Grossly normal heart sounds.   Respiratory: Normal respiratory effort.  No retractions. Lungs CTAB. No wheezing.  Gastrointestinal: Soft and nontender. No distention.  Musculoskeletal: No lower extremity tenderness nor edema. No gross deformities of extremities. Neurologic:  Normal speech and language. No gross focal neurologic deficits are appreciated.  Skin:  Skin is warm, dry and intact. No rash noted.  ____________________________________________   LABS (all labs ordered are listed, but only abnormal results are displayed)  Labs Reviewed  RESP PANEL BY RT-PCR (RSV, FLU A&B, COVID)  RVPGX2 - Abnormal; Notable for the following components:      Result Value   SARS Coronavirus 2 by RT PCR POSITIVE (*)    All other components within normal limits  BASIC METABOLIC PANEL - Abnormal; Notable for the following components:   Glucose, Bld 140 (*)    All other components within normal limits  CBC WITH DIFFERENTIAL/PLATELET - Abnormal; Notable for the following components:   Hemoglobin 17.1 (*)    Lymphs Abs 0.6 (*)    Monocytes Absolute 1.2 (*)    All other components within normal limits  TROPONIN I (HIGH SENSITIVITY)   ____________________________________________  EKG   EKG Interpretation  Date/Time:  Monday July 16 2022 09:55:59 EST Ventricular Rate:  65 PR Interval:  170 QRS Duration:  84 QT Interval:  399 QTC Calculation: 415 R Axis:   -15 Text Interpretation: Sinus rhythm Probable left atrial enlargement Borderline left axis deviation Low voltage, precordial leads Confirmed by Nanda Quinton [phone]) on 07/16/2022 9:57:35 AM        ____________________________________________  RADIOLOGY  DG Chest 2 View  Result Date: 07/16/2022 CLINICAL DATA:  Shortness of breath  EXAM: CHEST - 2 VIEW COMPARISON:  06/13/2021 FINDINGS: The heart size and mediastinal contours are within normal limits. Both lungs are clear. The visualized skeletal structures are unremarkable. IMPRESSION: No active cardiopulmonary disease. Electronically Signed   By: Davina Poke D.O.   On: 07/16/2022 09:56    ____________________________________________   PROCEDURES  Procedure(s) performed:   Procedures  None  ____________________________________________   INITIAL IMPRESSION / ASSESSMENT AND PLAN / ED COURSE  Pertinent labs & imaging results that were available during my care of the patient were reviewed by me and considered in my medical decision making (see chart for details).   This patient is Presenting for Evaluation of CP, which does require a range of treatment options, and is a complaint that involves a high risk of morbidity and mortality.  The Differential Diagnoses includes but is not exclusive to acute coronary syndrome, aortic dissection, pulmonary embolism, cardiac tamponade, community-acquired pneumonia, pericarditis, musculoskeletal chest wall pain, etc.   Critical Interventions-    Medications  sodium chloride 0.9 % bolus 500 mL (0 mLs Intravenous Stopped 07/16/22 1104)    Reassessment after intervention: Symptoms improved.    I did obtain Additional Historical Information from wife at bedside.   I decided to review pertinent External Data, and in summary patient with negative Lexiscan stress test in December 2023.  Echo in January of this year shows grade 1 diastolic dysfunction.    Clinical Laboratory Tests Ordered, included COVID-positive.  Troponin negative.  No acute kidney injury.  No leukocytosis or anemia.  Radiologic Tests Ordered, included CXR. I independently interpreted the images and agree with radiology interpretation.   Cardiac Monitor Tracing which shows NSR.    Social Determinants of Health Risk no active smoking history.   Medical  Decision Making: Summary:  Patient presents emergency department for evaluation of cough/congestion.  Symptoms seem most consistent with developing viral infection.  Has several medical comorbidities but overall looking well.  Plan for ACS workup partially for discomfort in the chest but also lightheadedness and diaphoresis this morning.   Reevaluation with update and discussion with the wife at bedside.  He is testing positive for COVID and we will initiate antiviral medication after discussion given his underlying lung disease.  No hypoxemia or increased work of breathing here to prompt further evaluation or admission although admission was considered.  Plan for Paxlovid and symptom management at home with close PCP follow-up.   Patient's presentation is most consistent with acute presentation with potential threat to life or bodily function.   Disposition: discharge  ____________________________________________  FINAL CLINICAL IMPRESSION(S) / ED DIAGNOSES  Final diagnoses:  COVID-19     NEW OUTPATIENT MEDICATIONS STARTED DURING THIS VISIT:  New Prescriptions   NIRMATRELVIR & RITONAVIR (PAXLOVID, 300/100,) 20 X 150 MG & 10 X '100MG'$  TBPK    Take 3 tablets by mouth 2 (two) times daily for 5 days.    Note:  This document was prepared using Dragon voice recognition software and may include unintentional dictation errors.  Nanda Quinton, MD, [facility] Emergency Medicine    Caulin Begley, [facility], MD 07/16/22 [phone]

## 2022-08-06 ENCOUNTER — Other Ambulatory Visit: Payer: Self-pay | Admitting: Family Medicine

## 2022-08-06 DIAGNOSIS — I2510 Atherosclerotic heart disease of native coronary artery without angina pectoris: Secondary | ICD-10-CM

## 2022-08-06 DIAGNOSIS — E7800 Pure hypercholesterolemia, unspecified: Secondary | ICD-10-CM

## 2022-10-01 ENCOUNTER — Other Ambulatory Visit: Payer: Self-pay | Admitting: Pulmonary Disease

## 2022-10-05 ENCOUNTER — Ambulatory Visit (INDEPENDENT_AMBULATORY_CARE_PROVIDER_SITE_OTHER): Payer: Federal, State, Local not specified - PPO | Admitting: Pulmonary Disease

## 2022-10-05 ENCOUNTER — Encounter: Payer: Self-pay | Admitting: Pulmonary Disease

## 2022-10-05 VITALS — BP 112/64 | HR 74 | Ht 71.0 in | Wt 219.0 lb

## 2022-10-05 DIAGNOSIS — G4733 Obstructive sleep apnea (adult) (pediatric): Secondary | ICD-10-CM | POA: Diagnosis not present

## 2022-10-05 MED ORDER — FLUTICASONE FUROATE-VILANTEROL 200-25 MCG/ACT IN AEPB: INHALATION_SPRAY | RESPIRATORY_TRACT | 5 refills | Status: DC

## 2022-10-05 NOTE — Progress Notes (Signed)
Subjective:    Patient ID: Alex Boyle, male    DOB: Oct 29, 1945, 77 y.o.   MRN: [id number]  Patient with a history of obstructive sleep apnea for many years History of asthma-controlled  Starting to get back into trying to exercise on a regular basis Alex Boyle will be joining the [facility] for water aerobics -Having some knee issues  BiPAP has been working okay -Notices some dryness -Air gets heavy sometimes  Historically Relocated from [facility] Alex Boyle did have a sleep study performed prior to his relocation The study did reveal that Alex Boyle benefited from use of an auto titrating setting Alex Boyle was previously on a CPAP of 14 Most recent titration study titrated him to BiPAP of 23/19  Benefited well from BiPAP when Alex Boyle was able to use it regularly  Alex Boyle does have nasal stuffiness and congestion, runny nose, postnasal drip Since Alex Boyle has not been able to use BiPAP regularly has been trying to rest in a recliner, still wakes up with congestion and coughing from what Alex Boyle thinks is postnasal drip  Usually goes to bed between 930 and 11 PM Falls asleep easily Final awakening between 4 and 5 AM  Weight has fluctuated   Past Medical History:  Diagnosis Date   Allergy    Arthritis    Asthma    COPD (chronic obstructive pulmonary disease) ([facility])    boderline per pt   Diverticulitis    Hyperlipidemia    Hypertension    Myocardial infarction ([facility])    Sleep apnea    uses bipap   Sleep apnea in adult    Family History  Adopted: Yes  Problem Relation Age of Onset   Uterine cancer Mother    Heart disease Mother    Hypertension Mother    Arthritis Mother    Alzheimer's disease Mother    Colon cancer Neg Hx    Pancreatic cancer Neg Hx    Esophageal cancer Neg Hx    Rectal cancer Neg Hx    Stomach cancer Neg Hx    Review of Systems  Constitutional:  Negative for fever and unexpected weight change.  HENT:  Negative for congestion, dental problem, ear pain, nosebleeds, postnasal drip,  rhinorrhea, sinus pressure, sneezing, sore throat and trouble swallowing.   Eyes:  Negative for redness and itching.  Respiratory:  Positive for apnea, cough and shortness of breath. Negative for chest tightness and wheezing.   Cardiovascular:  Negative for palpitations and leg swelling.  Gastrointestinal:  Negative for nausea and vomiting.  Genitourinary:  Negative for dysuria.  Musculoskeletal:  Negative for joint swelling.  Skin:  Negative for rash.  Allergic/Immunologic: Negative for food allergies and immunocompromised state.  Neurological:  Negative for headaches.  Hematological:  Does not bruise/bleed easily.  Psychiatric/Behavioral:  Positive for sleep disturbance. Negative for dysphoric mood. The patient is not nervous/anxious.        Objective:   Physical Exam Constitutional:      Appearance: Alex Boyle is obese.  HENT:     Head: Normocephalic and atraumatic.     Mouth/Throat:     Mouth: Mucous membranes are moist.  Eyes:     General: No scleral icterus. Cardiovascular:     Rate and Rhythm: Normal rate and regular rhythm.     Heart sounds: No murmur heard. Pulmonary:     Effort: Pulmonary effort is normal. No respiratory distress.     Breath sounds: Normal breath sounds. No stridor. No wheezing or rhonchi.  Abdominal:  General: There is no distension.     Tenderness: There is no abdominal tenderness.  Musculoskeletal:        General: No swelling or tenderness. Normal range of motion.     Cervical back: No rigidity. No muscular tenderness.  Skin:    General: Skin is warm and dry.     Coloration: Skin is not pale.  Neurological:     Mental Status: Alex Boyle is alert and oriented to person, place, and time.  Psychiatric:        Mood and Affect: Mood normal.     BP 112/64 (BP Location: Left Arm, Patient Position: Sitting, Cuff Size: Normal)   Pulse 74   Ht 5\' 11"  (1.803 m)   Wt 219 lb (99.3 kg)   SpO2 97%   BMI 30.54 kg/m     09/17/2019   11:50 AM 01/08/2019   11:00 AM   Results of the Epworth flowsheet  Sitting and reading 1 2  Watching TV 1 2  Sitting, inactive in a public place (e.g. a theatre or a meeting) 0 0  As a passenger in a car for an hour without a break 1 0  Lying down to rest in the afternoon when circumstances permit 3 0  Sitting and talking to someone 0 0  Sitting quietly after a lunch without alcohol 2 0  In a car, while stopped for a few minutes in traffic 0 0  Total score 8 4      Assessment & Plan:  .  Obstructive sleep apnea -Remains compliant with BiPAP BiPAP continues to help  Compliance data reveals 81% compliance Average use of 4 hours 47 minutes Is on BiPAP 22/18 with a residual AHI of 1  .  Asthma -Continue Breo 200 -Breo 200 seems to be helping much better than 130 was on before  .  Postnasal drip -Ipratropium nasal -Only using 1 spray as 2 sprays does cause significant dryness  Plan: .  Continue BiPAP  .  DME referral for BiPAP supplies  .  Continue to use Breo  .  I will see you in about 6 months

## 2022-10-05 NOTE — Patient Instructions (Signed)
DME referral for BiPAP supplies New mask  I will see you back in 6 months  Continue using your Breo, refill sent in for 6 months  Continue using current pressures, if you are having problems tolerating the pressures, you should let us know and we can try and adjust the settings  From a breathing perspective, I do not anticipate you having any problems with water aerobics

## 2022-10-05 NOTE — Addendum Note (Signed)
Addended by: Lanna Poche on: 10/05/2022 10:35 AM   Modules accepted: Orders

## 2022-10-12 DIAGNOSIS — M1711 Unilateral primary osteoarthritis, right knee: Secondary | ICD-10-CM | POA: Diagnosis not present

## 2022-10-25 ENCOUNTER — Telehealth: Payer: Self-pay | Admitting: Cardiology

## 2022-10-25 DIAGNOSIS — E7800 Pure hypercholesterolemia, unspecified: Secondary | ICD-10-CM

## 2022-10-25 DIAGNOSIS — I2510 Atherosclerotic heart disease of native coronary artery without angina pectoris: Secondary | ICD-10-CM

## 2022-10-25 DIAGNOSIS — I10 Essential (primary) hypertension: Secondary | ICD-10-CM

## 2022-10-25 MED ORDER — METOPROLOL TARTRATE 25 MG PO TABS: 25.0000 mg | ORAL_TABLET | Freq: Two times a day (BID) | ORAL | 1 refills | Status: DC

## 2022-10-25 MED ORDER — LISINOPRIL 5 MG PO TABS: 5.0000 mg | ORAL_TABLET | Freq: Every day | ORAL | 1 refills | Status: DC

## 2022-10-25 MED ORDER — ROSUVASTATIN CALCIUM 40 MG PO TABS: 40.0000 mg | ORAL_TABLET | Freq: Every day | ORAL | 1 refills | Status: DC

## 2022-10-25 NOTE — Telephone Encounter (Signed)
Pt c/o medication issue:  1. Name of Medication:    metoprolol tartrate (LOPRESSOR) 25 MG tablet  lisinopril (ZESTRIL) 5 MG tablet  rosuvastatin (CRESTOR) 40 MG tablet   2. How are you currently taking this medication (dosage and times per day)?   3. Are you having a reaction (difficulty breathing--STAT)?   4. What is your medication issue? Patient's PCP requested patient change these medications to his cardiologist to be filled. Requesting a 90 day supply be sent to pharmacy below.     [facility] [address] [place], [place] - [address] AT [address]

## 2022-10-25 NOTE — Telephone Encounter (Signed)
Medication sent as requested.

## 2022-10-31 DIAGNOSIS — M1711 Unilateral primary osteoarthritis, right knee: Secondary | ICD-10-CM | POA: Diagnosis not present

## 2022-11-06 DIAGNOSIS — L918 Other hypertrophic disorders of the skin: Secondary | ICD-10-CM | POA: Diagnosis not present

## 2022-11-06 DIAGNOSIS — L821 Other seborrheic keratosis: Secondary | ICD-10-CM | POA: Diagnosis not present

## 2022-11-06 DIAGNOSIS — D225 Melanocytic nevi of trunk: Secondary | ICD-10-CM | POA: Diagnosis not present

## 2022-11-06 DIAGNOSIS — L718 Other rosacea: Secondary | ICD-10-CM | POA: Diagnosis not present

## 2022-11-06 DIAGNOSIS — D3612 Benign neoplasm of peripheral nerves and autonomic nervous system, upper limb, including shoulder: Secondary | ICD-10-CM | POA: Diagnosis not present

## 2022-11-06 DIAGNOSIS — D2271 Melanocytic nevi of right lower limb, including hip: Secondary | ICD-10-CM | POA: Diagnosis not present

## 2022-11-06 DIAGNOSIS — L570 Actinic keratosis: Secondary | ICD-10-CM | POA: Diagnosis not present

## 2022-11-14 DIAGNOSIS — M1711 Unilateral primary osteoarthritis, right knee: Secondary | ICD-10-CM | POA: Diagnosis not present

## 2022-11-21 DIAGNOSIS — M1711 Unilateral primary osteoarthritis, right knee: Secondary | ICD-10-CM | POA: Diagnosis not present

## 2023-01-10 ENCOUNTER — Telehealth: Payer: Self-pay | Admitting: Pulmonary Disease

## 2023-01-10 DIAGNOSIS — G4733 Obstructive sleep apnea (adult) (pediatric): Secondary | ICD-10-CM

## 2023-01-10 NOTE — Telephone Encounter (Signed)
Pt. Calling about Bibpap supplies he needs a new order written to [facility] in [place] the old place discontinued some of the parts Fax#[phone]

## 2023-01-14 DIAGNOSIS — R972 Elevated prostate specific antigen [PSA]: Secondary | ICD-10-CM | POA: Diagnosis not present

## 2023-01-14 DIAGNOSIS — N401 Enlarged prostate with lower urinary tract symptoms: Secondary | ICD-10-CM | POA: Diagnosis not present

## 2023-01-14 DIAGNOSIS — R3912 Poor urinary stream: Secondary | ICD-10-CM | POA: Diagnosis not present

## 2023-01-15 NOTE — Telephone Encounter (Signed)
Place order for BiPAP supplies

## 2023-01-16 NOTE — Telephone Encounter (Signed)
Spoke with patient regarding prior message.BIPAP order has been p[laced to [place]. Patient's voice was understanding.Nothing else further needed.

## 2023-01-22 DIAGNOSIS — G4733 Obstructive sleep apnea (adult) (pediatric): Secondary | ICD-10-CM | POA: Diagnosis not present

## 2023-03-11 ENCOUNTER — Telehealth: Payer: Self-pay | Admitting: Family Medicine

## 2023-03-11 ENCOUNTER — Ambulatory Visit: Payer: Federal, State, Local not specified - PPO | Admitting: Family Medicine

## 2023-03-11 NOTE — Telephone Encounter (Signed)
Pt called in stating he was having car trouble and could not make his 1:00 OV with Dr Doreene Burke on 03/11/23. I sent a letter.

## 2023-03-11 NOTE — Telephone Encounter (Signed)
1st missed visit

## 2023-03-25 ENCOUNTER — Telehealth: Payer: Self-pay

## 2023-03-25 NOTE — Telephone Encounter (Signed)
LVM for to call the office and notify us of the reason for NV on 03/26/23 other than flu vaccine, if any additional vaccines needed. Appt Desk note states "flu shot and".

## 2023-03-25 NOTE — Telephone Encounter (Signed)
Done

## 2023-03-26 ENCOUNTER — Ambulatory Visit: Payer: Federal, State, Local not specified - PPO

## 2023-04-04 DIAGNOSIS — Z23 Encounter for immunization: Secondary | ICD-10-CM | POA: Diagnosis not present

## 2023-04-09 ENCOUNTER — Other Ambulatory Visit: Payer: Self-pay | Admitting: Pulmonary Disease

## 2023-04-09 DIAGNOSIS — T7840XA Allergy, unspecified, initial encounter: Secondary | ICD-10-CM

## 2023-04-23 ENCOUNTER — Other Ambulatory Visit: Payer: Self-pay | Admitting: Cardiology

## 2023-04-23 DIAGNOSIS — I10 Essential (primary) hypertension: Secondary | ICD-10-CM

## 2023-04-23 NOTE — Telephone Encounter (Signed)
Rx refill sent to pharmacy. 

## 2023-04-26 ENCOUNTER — Other Ambulatory Visit: Payer: Self-pay

## 2023-04-26 DIAGNOSIS — E7800 Pure hypercholesterolemia, unspecified: Secondary | ICD-10-CM

## 2023-04-26 DIAGNOSIS — I2510 Atherosclerotic heart disease of native coronary artery without angina pectoris: Secondary | ICD-10-CM

## 2023-04-26 MED ORDER — ROSUVASTATIN CALCIUM 40 MG PO TABS: 40.0000 mg | ORAL_TABLET | Freq: Every day | ORAL | 0 refills | Status: AC

## 2023-05-10 ENCOUNTER — Other Ambulatory Visit: Payer: Self-pay | Admitting: Pulmonary Disease

## 2023-05-10 DIAGNOSIS — T7840XA Allergy, unspecified, initial encounter: Secondary | ICD-10-CM

## 2023-05-14 DIAGNOSIS — D2271 Melanocytic nevi of right lower limb, including hip: Secondary | ICD-10-CM | POA: Diagnosis not present

## 2023-05-14 DIAGNOSIS — L718 Other rosacea: Secondary | ICD-10-CM | POA: Diagnosis not present

## 2023-05-14 DIAGNOSIS — D2262 Melanocytic nevi of left upper limb, including shoulder: Secondary | ICD-10-CM | POA: Diagnosis not present

## 2023-05-14 DIAGNOSIS — I872 Venous insufficiency (chronic) (peripheral): Secondary | ICD-10-CM | POA: Diagnosis not present

## 2023-05-14 DIAGNOSIS — I8311 Varicose veins of right lower extremity with inflammation: Secondary | ICD-10-CM | POA: Diagnosis not present

## 2023-05-14 DIAGNOSIS — L570 Actinic keratosis: Secondary | ICD-10-CM | POA: Diagnosis not present

## 2023-05-14 DIAGNOSIS — L821 Other seborrheic keratosis: Secondary | ICD-10-CM | POA: Diagnosis not present

## 2023-05-14 DIAGNOSIS — I8312 Varicose veins of left lower extremity with inflammation: Secondary | ICD-10-CM | POA: Diagnosis not present

## 2023-05-21 ENCOUNTER — Other Ambulatory Visit: Payer: Self-pay

## 2023-05-21 DIAGNOSIS — I10 Essential (primary) hypertension: Secondary | ICD-10-CM

## 2023-05-21 MED ORDER — LISINOPRIL 5 MG PO TABS: 5.0000 mg | ORAL_TABLET | Freq: Every day | ORAL | 0 refills | Status: DC

## 2023-05-21 MED ORDER — METOPROLOL TARTRATE 25 MG PO TABS: 25.0000 mg | ORAL_TABLET | Freq: Two times a day (BID) | ORAL | 0 refills | Status: DC

## 2023-06-27 ENCOUNTER — Other Ambulatory Visit: Payer: Self-pay | Admitting: Cardiology

## 2023-06-27 DIAGNOSIS — I10 Essential (primary) hypertension: Secondary | ICD-10-CM

## 2023-07-09 ENCOUNTER — Ambulatory Visit: Payer: Federal, State, Local not specified - PPO | Admitting: Pulmonary Disease

## 2023-07-09 ENCOUNTER — Encounter: Payer: Self-pay | Admitting: Pulmonary Disease

## 2023-07-09 VITALS — BP 118/64 | HR 63 | Temp 98.7°F | Ht 72.0 in | Wt 219.6 lb

## 2023-07-09 DIAGNOSIS — J45909 Unspecified asthma, uncomplicated: Secondary | ICD-10-CM | POA: Diagnosis not present

## 2023-07-09 DIAGNOSIS — G4733 Obstructive sleep apnea (adult) (pediatric): Secondary | ICD-10-CM

## 2023-07-09 MED ORDER — FLUTICASONE FUROATE-VILANTEROL 200-25 MCG/ACT IN AEPB: INHALATION_SPRAY | RESPIRATORY_TRACT | 5 refills | Status: DC

## 2023-07-09 NOTE — Progress Notes (Signed)
Subjective:    Patient ID: Alex Boyle, male    DOB: 08/23/1945, 78 y.o.   MRN: [id number]  Patient with a history of obstructive sleep apnea for many years History of asthma-controlled  Has been doing relatively well  Trying to get more active -Does have knee issues which are chronic  Has been tolerating his BiPAP well -Does have some dryness -Occasional mask leaks -Does not want any changes made to the BiPAP at present feels it works well  Historically Relocated from [facility] He did have a sleep study performed prior to his relocation The study did reveal that he benefited from use of an auto titrating setting He was previously on a CPAP of 14 Most recent titration study titrated him to BiPAP of 23/19  Benefited well from BiPAP when he was able to use it regularly  He does have nasal stuffiness and congestion, runny nose, postnasal drip Since he has not been able to use BiPAP regularly has been trying to rest in a recliner, still wakes up with congestion and coughing from what he thinks is postnasal drip  Usually goes to bed between 930 and 11 PM Falls asleep easily Final awakening between 4 and 5 AM  Weight has fluctuated   Past Medical History:  Diagnosis Date   Allergy    Arthritis    Asthma    COPD (chronic obstructive pulmonary disease) ([facility])    boderline per pt   Diverticulitis    Hyperlipidemia    Hypertension    Myocardial infarction ([facility])    Sleep apnea    uses bipap   Sleep apnea in adult    Family History  Adopted: Yes  Problem Relation Age of Onset   Uterine cancer Mother    Heart disease Mother    Hypertension Mother    Arthritis Mother    Alzheimer's disease Mother    Colon cancer Neg Hx    Pancreatic cancer Neg Hx    Esophageal cancer Neg Hx    Rectal cancer Neg Hx    Stomach cancer Neg Hx    Review of Systems  Constitutional:  Negative for fever and unexpected weight change.  HENT:  Negative for congestion, dental  problem, ear pain, nosebleeds, postnasal drip, rhinorrhea, sinus pressure, sneezing, sore throat and trouble swallowing.   Eyes:  Negative for redness and itching.  Respiratory:  Positive for apnea. Negative for cough, chest tightness, shortness of breath and wheezing.   Cardiovascular:  Negative for palpitations and leg swelling.  Gastrointestinal:  Negative for nausea and vomiting.  Genitourinary:  Negative for dysuria.  Musculoskeletal:  Negative for joint swelling.  Skin:  Negative for rash.  Allergic/Immunologic: Negative for food allergies and immunocompromised state.  Neurological:  Negative for headaches.  Hematological:  Does not bruise/bleed easily.  Psychiatric/Behavioral:  Positive for sleep disturbance. Negative for dysphoric mood. The patient is not nervous/anxious.        Objective:   Physical Exam Constitutional:      Appearance: He is obese.  HENT:     Head: Normocephalic and atraumatic.     Mouth/Throat:     Mouth: Mucous membranes are moist.  Eyes:     General: No scleral icterus. Cardiovascular:     Rate and Rhythm: Normal rate and regular rhythm.     Heart sounds: No murmur heard. Pulmonary:     Effort: Pulmonary effort is normal. No respiratory distress.     Breath sounds: Normal breath sounds. No stridor. No  wheezing or rhonchi.  Abdominal:     General: There is no distension.     Tenderness: There is no abdominal tenderness.  Musculoskeletal:        General: No swelling or tenderness. Normal range of motion.     Cervical back: No rigidity. No muscular tenderness.  Skin:    General: Skin is warm and dry.     Coloration: Skin is not pale.  Neurological:     Mental Status: He is alert and oriented to person, place, and time.  Psychiatric:        Mood and Affect: Mood normal.    BP 118/64 (BP Location: Left Arm, Patient Position: Sitting, Cuff Size: Normal)   Pulse 63   Temp 98.7 F (37.1 C) (Oral)   Ht 6' (1.829 m)   Wt 219 lb 9.6 oz (99.6 kg)    SpO2 95%   BMI 29.78 kg/m     09/17/2019   11:50 AM 01/08/2019   11:00 AM  Results of the Epworth flowsheet  Sitting and reading 1 2  Watching TV 1 2  Sitting, inactive in a public place (e.g. a theatre or a meeting) 0 0  As a passenger in a car for an hour without a break 1 0  Lying down to rest in the afternoon when circumstances permit 3 0  Sitting and talking to someone 0 0  Sitting quietly after a lunch without alcohol 2 0  In a car, while stopped for a few minutes in traffic 0 0  Total score 8 4      Assessment & Plan:  .  Obstructive sleep apnea -Remains compliant with BiPAP BiPAP continues to help   BiPAP compliance shows 100% compliance over the last 30 days, average use of 5 hours 42 minutes BiPAP settings 22/18 Residual AHI of 0.3  .  Asthma -Continue Breo 200 -Refills sent  .  Postnasal drip  -Continue with ipratropium nasal  Plan: .  Continue BiPAP use  .  Continue Breo  .  Encouraged to call with any significant concerns  .  Follow-up a year from now

## 2023-07-09 NOTE — Patient Instructions (Addendum)
I will see you a year from now  Continue graded activities as tolerated  Call us with significant concerns  The download from your machine shows it is working well  Refills for your [facility] sent to pharmacy

## 2023-07-10 ENCOUNTER — Other Ambulatory Visit: Payer: Self-pay | Admitting: Cardiology

## 2023-07-10 DIAGNOSIS — I10 Essential (primary) hypertension: Secondary | ICD-10-CM

## 2023-07-12 ENCOUNTER — Encounter: Payer: Self-pay | Admitting: Cardiology

## 2023-07-12 ENCOUNTER — Telehealth: Payer: Self-pay | Admitting: Cardiology

## 2023-07-12 ENCOUNTER — Other Ambulatory Visit: Payer: Self-pay

## 2023-07-12 ENCOUNTER — Ambulatory Visit: Payer: Federal, State, Local not specified - PPO | Attending: Cardiology | Admitting: Cardiology

## 2023-07-12 VITALS — BP 122/82 | HR 66 | Ht 72.0 in | Wt 212.0 lb

## 2023-07-12 DIAGNOSIS — I10 Essential (primary) hypertension: Secondary | ICD-10-CM

## 2023-07-12 DIAGNOSIS — E785 Hyperlipidemia, unspecified: Secondary | ICD-10-CM

## 2023-07-12 DIAGNOSIS — G4733 Obstructive sleep apnea (adult) (pediatric): Secondary | ICD-10-CM | POA: Diagnosis not present

## 2023-07-12 DIAGNOSIS — I2510 Atherosclerotic heart disease of native coronary artery without angina pectoris: Secondary | ICD-10-CM | POA: Diagnosis not present

## 2023-07-12 MED ORDER — LISINOPRIL 5 MG PO TABS: 5.0000 mg | ORAL_TABLET | Freq: Every day | ORAL | 1 refills | Status: DC

## 2023-07-12 MED ORDER — METOPROLOL TARTRATE 25 MG PO TABS: 25.0000 mg | ORAL_TABLET | Freq: Two times a day (BID) | ORAL | 1 refills | Status: DC

## 2023-07-12 NOTE — Patient Instructions (Addendum)
Medication Instructions:  Your physician recommends that you continue on your current medications as directed. Please refer to the Current Medication list given to you today.  *If you need a refill on your cardiac medications before your next appointment, please call your pharmacy*   Lab Work: 3rd Floor [address] Lipid, AST, ALT If you have labs (blood work) drawn today and your tests are completely normal, you will receive your results only by: MyChart Message (if you have MyChart) OR A paper copy in the mail If you have any lab test that is abnormal or we need to change your treatment, we will call you to review the results.   Testing/Procedures: None Ordered   Follow-Up: At [facility], you and your health needs are our priority.  As part of our continuing mission to provide you with exceptional heart care, we have created designated Provider Care Teams.  These Care Teams include your primary Cardiologist (physician) and Advanced Practice Providers (APPs -  Physician Assistants and Nurse Practitioners) who all work together to provide you with the care you need, when you need it.  We recommend signing up for the patient portal called "MyChart".  Sign up information is provided on this After Visit Summary.  MyChart is used to connect with patients for Virtual Visits (Telemedicine).  Patients are able to view lab/test results, encounter notes, upcoming appointments, etc.  Non-urgent messages can be sent to your provider as well.   To learn more about what you can do with MyChart, go to [email or web address].    Your next appointment:   6 month(s)  The format for your next appointment:   In Person  Provider:   Gypsy Balsam, MD    Other Instructions NA

## 2023-07-12 NOTE — Addendum Note (Signed)
Addended by: Baldo Ash D on: 07/12/2023 10:50 AM   Modules accepted: Orders

## 2023-07-12 NOTE — Telephone Encounter (Signed)
*  STAT* If patient is at the pharmacy, call can be transferred to refill team.   1. Which medications need to be refilled? (please list name of each medication and dose if known)   lisinopril (ZESTRIL) 5 MG tablet    metoprolol tartrate (LOPRESSOR) 25 MG tablet    2. Which pharmacy/location (including street and city if local pharmacy) is medication to be sent to? [facility] [address] [place], [place] - [address] Phone: [phone]  Fax: [phone]      3. Do they need a 30 day or 90 day supply? 90  Pt came in for appt 07/12/23

## 2023-07-12 NOTE — Progress Notes (Signed)
Cardiology Office Note:    Date:  07/12/2023   ID:  Cleland Simkins [place], [place] 17-Apr-1946, MRN [id number]  PCP:  Mliss Sax, MD  Cardiologist:  Gypsy Balsam, MD    Referring MD: Mliss Sax,*   Chief Complaint  Patient presents with   Medication Refill    All cardiac meds     History of Present Illness:    Alex Boyle is a 78 y.o. male   with a history of ASCAD s/p remote MI with PCI x 2, HTN, hyperlipidemia, OSA and asthma.  He recently moved here from [facility].  He is s/p remote MI in 2012 at which time he had total occlusion of the LAD and underwent PCI.  He was also noted to have high-grade lesion in his left circumflex but was medically managed.  Several months later he had a follow-up nuclear stress test and collapsed on the treadmill.  He underwent PCI of the left circumflex.  His last cath was October 2016 which showed widely patent stents and no other CAD  Last time I seen him I scheduled him to have a stress test stress test was negative, also echocardiogram be done which showed preserved ejection fraction. Comes today to months for follow-up doing well cardiac wise.  He described problem with the right foot required some surgical intervention still have some difficulty walking but gradually getting better.  He did have some vascular studies checked there was a good.  He and and I do feel by good pulses in his there is posterior tibial.  He described however when he woke sometimes he gets some strength sensation left side of his chest.  He did not try nitroglycerin for it  Past Medical History:  Diagnosis Date   Allergy    Arthritis    Asthma    COPD (chronic obstructive pulmonary disease) (HCC)    boderline per pt   Diverticulitis    Hyperlipidemia    Hypertension    Myocardial infarction (HCC)    Sleep apnea    uses bipap   Sleep apnea in adult     Past Surgical History:  Procedure Laterality Date   COLON SURGERY      COLONOSCOPY     CORONARY STENT PLACEMENT     x2   FOOT SURGERY     JOINT REPLACEMENT     knee   LAPAROSCOPIC TRANSABDOMINAL HERNIA     MANDIBLE SURGERY     skin cancer     basil cell removed on back   UPPER GASTROINTESTINAL ENDOSCOPY      Current Medications: Current Meds  Medication Sig   albuterol (VENTOLIN HFA) 108 (90 Base) MCG/ACT inhaler Inhale 1-2 puffs into the lungs every 6 (six) hours as needed for wheezing or shortness of breath.   B Complex Vitamins (VITAMIN-B COMPLEX PO) Take 1 tablet by mouth daily.   Cholecalciferol 25 MCG (1000 UT) capsule Take 1,000 Units by mouth daily.   clindamycin (CLEOCIN) 150 MG capsule Take 150 mg by mouth as needed (Dental procedure).   cyclobenzaprine (FLEXERIL) 10 MG tablet Take 1 tablet (10 mg total) by mouth 3 (three) times daily as needed for muscle spasms.   finasteride (PROSCAR) 5 MG tablet Take 1qd (Plz sched appt with new provider for future fills) (Patient taking differently: Take 5 mg by mouth daily. Take 1qd (Plz sched appt with new provider for future fills))   fluticasone furoate-vilanterol (BREO ELLIPTA) 200-25 MCG/ACT AEPB INHALE ONE PUFF BY MOUTH  ONE TIME DAILY   ipratropium (ATROVENT) 0.03 % nasal spray Place 2 sprays into both nostrils every 12 (twelve) hours.   lisinopril (ZESTRIL) 5 MG tablet Take 1 tablet (5 mg total) by mouth daily. Patient needs an appointment for further refills. 2 nd attempt   metoprolol tartrate (LOPRESSOR) 25 MG tablet Take 1 tablet (25 mg total) by mouth 2 (two) times daily. Patient needs an appointment for further refills. 2 nd attempt   montelukast (SINGULAIR) 10 MG tablet TAKE ONE TABLET BY MOUTH AT BEDTIME   Multiple Vitamins-Minerals (MULTIVITAMIN ADULTS 50+ PO) Take 1 tablet by mouth daily.   nitroGLYCERIN (NITROSTAT) 0.4 MG SL tablet Place 0.4 mg under the tongue every 5 (five) minutes as needed for chest pain.   omeprazole (PRILOSEC) 20 MG capsule TAKE ONE CAPSULE BY MOUTH TWICE A DAY BEFORE A  MEAL (Patient taking differently: Take 20 mg by mouth 2 (two) times daily before a meal. TAKE ONE CAPSULE BY MOUTH TWICE A DAY BEFORE A MEAL)   rosuvastatin (CRESTOR) 40 MG tablet Take 1 tablet (40 mg total) by mouth daily. 1rst attempt, patient needs and appt for additional refills   tamsulosin (FLOMAX) 0.4 MG CAPS capsule Take 1qd (Plz sched appt with new provider for future fills) (Patient taking differently: Take 0.4 mg by mouth daily after supper. Take 1qd (Plz sched appt with new provider for future fills))     Allergies:   Codeine, Gramineae pollens, and Penicillins   Social History   Socioeconomic History   Marital status: Married    Spouse name: Not on file   Number of children: 1   Years of education: Not on file   Highest education level: Some college, no degree  Occupational History   Not on file  Tobacco Use   Smoking status: Former    Types: Cigarettes   Smokeless tobacco: Never  Vaping Use   Vaping status: Never Used  Substance and Sexual Activity   Alcohol use: Yes    Comment: soically    Drug use: Never   Sexual activity: Yes    Birth control/protection: None  Other Topics Concern   Not on file  Social History Narrative   Not on file   Social Drivers of Health   Financial Resource Strain: Low Risk  (03/07/2023)   Overall Financial Resource Strain (CARDIA)    Difficulty of Paying Living Expenses: Not hard at all  Food Insecurity: No Food Insecurity (03/07/2023)   Hunger Vital Sign    Worried About Running Out of Food in the Last Year: Never true    Ran Out of Food in the Last Year: Never true  Transportation Needs: No Transportation Needs (03/07/2023)   PRAPARE - Administrator, Civil Service (Medical): No    Lack of Transportation (Non-Medical): No  Physical Activity: Unknown (03/07/2023)   Exercise Vital Sign    Days of Exercise per Week: 0 days    Minutes of Exercise per Session: Not on file  Stress: No Stress Concern Present (03/07/2023)    [employer] of Occupational Health - Occupational Stress Questionnaire    Feeling of Stress : Not at all  Social Connections: Unknown (04/04/2023)   Received from [facility]   Social Network    Social Network: Not on file  Recent Concern: Social Connections - Moderately Isolated (03/07/2023)   Social Connection and Isolation Panel [NHANES]    Frequency of Communication with Friends and Family: Once a week    Frequency of  Social Gatherings with Friends and Family: Once a week    Attends Religious Services: More than 4 times per year    Active Member of [employer] or Organizations: No    Attends Engineer, structural: Not on file    Marital Status: Married     Family History: The patient's family history includes Alzheimer's disease in his mother; Arthritis in his mother; Heart disease in his mother; Hypertension in his mother; Uterine cancer in his mother. There is no history of Colon cancer, Pancreatic cancer, Esophageal cancer, Rectal cancer, or Stomach cancer. He was adopted. ROS:   Please see the history of present illness.    All 14 point review of systems negative except as described per history of present illness  EKGs/Labs/Other Studies Reviewed:    EKG Interpretation Date/Time:  Friday July 12 2023 09:51:03 EST Ventricular Rate:  70 PR Interval:  170 QRS Duration:  78 QT Interval:  386 QTC Calculation: 416 R Axis:   2  Text Interpretation: Sinus rhythm with Premature atrial complexes Low voltage QRS Inferior infarct , age undetermined Cannot rule out Anterior infarct , age undetermined When compared with ECG of 16-Jul-2022 09:55, PREVIOUS ECG IS PRESENT Confirmed by Gypsy Balsam [phone]) on 07/12/2023 10:08:49 AM    Recent Labs: 07/16/2022: BUN 11; Creatinine, Ser 0.84; Hemoglobin 17.1; Platelets 151; Potassium 3.9; Sodium 135  Recent Lipid Panel    Component Value Date/Time   CHOL 142 06/21/2022 0922   TRIG 172 (H) 06/21/2022 0922   HDL 39 (L)  06/21/2022 0922   CHOLHDL 3.6 06/21/2022 0922   CHOLHDL 5 11/15/2020 0932   VLDL 44.2 (H) 11/15/2020 0932   LDLCALC 74 06/21/2022 0922   LDLCALC 79 10/31/2018 1100   LDLDIRECT 74.0 02/14/2021 1004    Physical Exam:    VS:  BP 122/82 (BP Location: Right Arm, Patient Position: Sitting)   Pulse 66   Ht 6' (1.829 m)   Wt 212 lb (96.2 kg)   SpO2 91%   BMI 28.75 kg/m     Wt Readings from Last 3 Encounters:  07/12/23 212 lb (96.2 kg)  07/09/23 219 lb 9.6 oz (99.6 kg)  10/05/22 219 lb (99.3 kg)     GEN:  Well nourished, well developed in no acute distress HEENT: Normal NECK: No JVD; No carotid bruits LYMPHATICS: No lymphadenopathy CARDIAC: RRR, no murmurs, no rubs, no gallops RESPIRATORY:  Clear to auscultation without rales, wheezing or rhonchi  ABDOMEN: Soft, non-tender, non-distended MUSCULOSKELETAL:  No edema; No deformity  SKIN: Warm and dry LOWER EXTREMITIES: no swelling NEUROLOGIC:  Alert and oriented x 3 PSYCHIATRIC:  Normal affect   ASSESSMENT:    1. Essential hypertension   2. Coronary artery disease involving native coronary artery of native heart without angina pectoris   3. OSA (obstructive sleep apnea)   4. Hyperlipidemia LDL goal <70    PLAN:    In order of problems listed above:  Coronary disease.  Stress test reviewed from last year negative denies having any dramatic symptoms but sometimes he complained of having some chest tightness when he finished exercising.  I asked him to start taking nitroglycerin for it on as-needed basis.  I want to give him long-acting nitroglycerin he did not want to try that. Dyslipidemia I did review K PN which show LDL 74 HDL 39.  Will schedule him to have fasting lipid profile done. Obstructive sleep apnea with CPAP mask with good results. Essential hypertension blood pressure well-controlled   Medication Adjustments/Labs  and Tests Ordered: Current medicines are reviewed at length with the patient today.  Concerns  regarding medicines are outlined above.  Orders Placed This Encounter  Procedures   EKG 12-Lead   Medication changes: No orders of the defined types were placed in this encounter.   Signed, Georgeanna Lea, MD, [facility] 07/12/2023 10:37 AM    [facility]

## 2023-07-12 NOTE — Telephone Encounter (Signed)
RX sent to pharmacy provided.   Thanks!

## 2023-07-13 LAB — LIPID PANEL
Chol/HDL Ratio: 3.9 {ratio} (ref 0.0–5.0)
Cholesterol, Total: 151 mg/dL (ref 100–199)
HDL: 39 mg/dL — ABNORMAL LOW (ref >39)
LDL Chol Calc (NIH): 82 mg/dL (ref 0–99)
Triglycerides: 178 mg/dL — ABNORMAL HIGH (ref 0–149)
VLDL Cholesterol Cal: 30 mg/dL (ref 5–40)

## 2023-07-13 LAB — ALT: ALT: 31 [IU]/L (ref 0–44)

## 2023-07-13 LAB — AST: AST: 28 [IU]/L (ref 0–40)

## 2023-07-16 ENCOUNTER — Telehealth: Payer: Self-pay

## 2023-07-16 DIAGNOSIS — I2510 Atherosclerotic heart disease of native coronary artery without angina pectoris: Secondary | ICD-10-CM

## 2023-07-16 DIAGNOSIS — E785 Hyperlipidemia, unspecified: Secondary | ICD-10-CM

## 2023-07-16 MED ORDER — EZETIMIBE 10 MG PO TABS: 10.0000 mg | ORAL_TABLET | Freq: Every day | ORAL | 0 refills | Status: DC

## 2023-07-16 NOTE — Telephone Encounter (Signed)
 Patient notified of results and recommendations and agreed with plan, medication sent, lab order on file.

## 2023-07-16 NOTE — Telephone Encounter (Signed)
-----   Message from Gypsy Balsam sent at 07/15/2023  9:57 AM EST ----- Cholesterol still not good, please add Zetia 10 daily to medical regiment, fasting lipid profile, AST ALT 6 weeks

## 2023-08-19 ENCOUNTER — Ambulatory Visit: Payer: Self-pay | Admitting: Family Medicine

## 2023-08-19 ENCOUNTER — Ambulatory Visit (INDEPENDENT_AMBULATORY_CARE_PROVIDER_SITE_OTHER): Admitting: Family Medicine

## 2023-08-19 ENCOUNTER — Encounter: Payer: Self-pay | Admitting: Family Medicine

## 2023-08-19 VITALS — BP 124/52 | HR 78 | Temp 98.0°F | Ht 72.0 in | Wt 215.2 lb

## 2023-08-19 DIAGNOSIS — S39012A Strain of muscle, fascia and tendon of lower back, initial encounter: Secondary | ICD-10-CM | POA: Diagnosis not present

## 2023-08-19 DIAGNOSIS — M5410 Radiculopathy, site unspecified: Secondary | ICD-10-CM | POA: Diagnosis not present

## 2023-08-19 MED ORDER — CYCLOBENZAPRINE HCL 10 MG PO TABS: 10.0000 mg | ORAL_TABLET | Freq: Three times a day (TID) | ORAL | 0 refills | Status: DC | PRN

## 2023-08-19 MED ORDER — PREDNISONE 10 MG PO TABS: ORAL_TABLET | ORAL | 0 refills | Status: DC

## 2023-08-19 MED ORDER — TRAMADOL HCL 50 MG PO TABS: 50.0000 mg | ORAL_TABLET | Freq: Three times a day (TID) | ORAL | 0 refills | Status: AC | PRN

## 2023-08-19 NOTE — Telephone Encounter (Signed)
  Chief Complaint: back pain from strain Friday moving objects in a closet Symptoms: low back pain radiates to neck. 2-3/10 pain level sitting 9/10 pain level standing or movement. Hx back issues Frequency: Friday  Pertinent Negatives: Patient denies N/T no fever no issues with urination, can walk Disposition: [] ED /[] Urgent Care (no appt availability in office) / [x] Appointment(In office/virtual)/ []  [place] [facility]/ [] Home Care/ [] Refused Recommended Disposition /[] [place] [facility]/ []  Follow-up with PCP Additional Notes:   No available appt with PCP today . Scheduled appt today at [facility] .      Copied from CRM [id number]. Topic: Clinical - Red Word Triage >> Aug 19, 2023 11:17 AM Lennart Pall wrote: Red Word that prompted transfer to Nurse Triage: Patient strained his back on friday. having back spasms, moved into his neck. difficulty moving around, very painful. Reason for Disposition  [1] SEVERE back pain (e.g., excruciating, unable to do any normal activities) AND [2] not improved 2 hours after pain medicine  Answer Assessment - Initial Assessment Questions 1. ONSET: "When did the pain begin?"      Friday  2. LOCATION: "Where does it hurt?" (upper, mid or lower back)     Low Back moved into neck  3. SEVERITY: "How bad is the pain?"  (e.g., Scale 1-10; mild, moderate, or severe)   - MILD (1-3): Doesn't interfere with normal activities.    - MODERATE (4-7): Interferes with normal activities or awakens from sleep.    - SEVERE (8-10): Excruciating pain, unable to do any normal activities.      Pain level 2-3 sitting with movement 9-10 /10 4. PATTERN: "Is the pain constant?" (e.g., yes, no; constant, intermittent)      Constant with movement  5. RADIATION: "Does the pain shoot into your legs or somewhere else?"     Neck  6. CAUSE:  "What do you think is causing the back pain?"      Strain back . Lifting objects  7. BACK OVERUSE:  "Any recent lifting of heavy  objects, strenuous work or exercise?"     na 8. MEDICINES: "What have you taken so far for the pain?" (e.g., nothing, acetaminophen, NSAIDS)     Nothing  9. NEUROLOGIC SYMPTOMS: "Do you have any weakness, numbness, or problems with bowel/bladder control?"     Na  10. OTHER SYMPTOMS: "Do you have any other symptoms?" (e.g., fever, abdomen pain, burning with urination, blood in urine)       Low back pain , pain shoots up to neck  11. PREGNANCY: "Is there any chance you are pregnant?" "When was your last menstrual period?"       na  Protocols used: Back Pain-A-AH

## 2023-08-19 NOTE — Progress Notes (Unsigned)
   Subjective:    Patient ID: Alex Boyle, male    DOB: [birth year], 78 y.o.   MRN: [id number]  HPI Back pain- pt has hx of similar.  These sxs started on Friday as he was moving stuff out of storage.  After picking up a table saw he developed lightning like pain from his R lower back up into neck.  Pain also radiates into buttock.  Pain is most notable across lower back in a band like distribution.  + muscle spasm.  Using heating pad and muscle relaxers w/ minimal relief.   Review of Systems For ROS see HPI     Objective:   Physical Exam Vitals reviewed.  Constitutional:      General: He is not in acute distress.    Appearance: Normal appearance. He is not ill-appearing.  HENT:     Head: Normocephalic and atraumatic.  Cardiovascular:     Pulses: Normal pulses.  Musculoskeletal:        General: Tenderness (TTP over lumbar spine w/ paraspinal muscle spasm bilaterally) present.  Skin:    General: Skin is warm and dry.  Neurological:     Mental Status: He is alert and oriented to person, place, and time.     Cranial Nerves: No cranial nerve deficit.     Comments: + SLR on R, (-) on L           Assessment & Plan:  Radicular LBP- new to provider.  Occurred after improper lifting on Friday.  Has hx of similar.  Will start Prednisone taper for inflammation, flexeril for muscle spasm, and Tramadol to use sparingly for severe pain.  Reviewed supportive care and red flags that should prompt return.  Pt expressed understanding and is in agreement w/ plan.

## 2023-08-19 NOTE — Patient Instructions (Signed)
 Follow up as needed or as scheduled START the Prednisone as directed- 3 pills at the same time x3 days, then 2 pills at the same time x3 days, then 1 pill daily.  Take w/ food  USE the cyclobenzaprine (Flexeril) as needed for spasm- may cause drowsiness TAKE the Tramadol as needed for severe pain Continue to heat for pain relief Gentle stretching to avoid stiffness Call with any questions or concerns Stay Safe!  Stay Healthy! Hang in there!!!

## 2023-08-20 ENCOUNTER — Encounter: Payer: Self-pay | Admitting: Family Medicine

## 2023-08-30 LAB — ALT: ALT: 105 IU/L — ABNORMAL HIGH (ref 0–44)

## 2023-08-30 LAB — LIPID PANEL
Chol/HDL Ratio: 3.3 ratio (ref 0.0–5.0)
Cholesterol, Total: 127 mg/dL (ref 100–199)
HDL: 39 mg/dL — ABNORMAL LOW (ref >39)
LDL Chol Calc (NIH): 63 mg/dL (ref 0–99)
Triglycerides: 146 mg/dL (ref 0–149)
VLDL Cholesterol Cal: 25 mg/dL (ref 5–40)

## 2023-08-30 LAB — AST: AST: 45 IU/L — ABNORMAL HIGH (ref 0–40)

## 2023-10-01 ENCOUNTER — Encounter: Payer: Self-pay | Admitting: Nurse Practitioner

## 2023-10-01 ENCOUNTER — Ambulatory Visit (INDEPENDENT_AMBULATORY_CARE_PROVIDER_SITE_OTHER): Admitting: Nurse Practitioner

## 2023-10-01 VITALS — BP 128/84 | HR 66 | Temp 97.4°F | Wt 216.8 lb

## 2023-10-01 DIAGNOSIS — M5410 Radiculopathy, site unspecified: Secondary | ICD-10-CM | POA: Diagnosis not present

## 2023-10-01 MED ORDER — TRIAMCINOLONE ACETONIDE 40 MG/ML IJ SUSP
40.0000 mg | Freq: Once | INTRAMUSCULAR | Status: AC
  Administered 2023-10-01: 40 mg via INTRAMUSCULAR

## 2023-10-01 MED ORDER — CYCLOBENZAPRINE HCL 10 MG PO TABS: 10.0000 mg | ORAL_TABLET | Freq: Three times a day (TID) | ORAL | 0 refills | Status: DC | PRN

## 2023-10-01 MED ORDER — TRAMADOL HCL 50 MG PO TABS: 50.0000 mg | ORAL_TABLET | Freq: Three times a day (TID) | ORAL | 0 refills | Status: DC | PRN

## 2023-10-01 NOTE — Assessment & Plan Note (Signed)
 Chronic low back pain has acutely worsened, with pain radiating to the right buttock and leg, accompanied by muscle spasms. Previous treatment with prednisone  and tramadol  was effective, however pain returned after another injury 2 weeks ago. Current pain is severe, limiting function. Will give kenalog  40mg  IM in office today. Prescribe tramadol  50mg  every eight hours as needed for pain and flexeril  10mg  every eight hours as needed, with caution for drowsiness. PDMP reviewed. Refer to physical therapy for management and rehabilitation with targeted exercises. Follow-up with PCP in 6 weeks.

## 2023-10-01 NOTE — Progress Notes (Signed)
 Acute Office Visit  Subjective:     Patient ID: Alex Boyle, male    DOB: [birth year], 78 y.o.   MRN: [id number]  Chief Complaint  Patient presents with   Back Pain    Lower back pain for 2 weeks and request referral for physical therapy    HPI Discussed the use of AI scribe software for clinical note transcription with the patient, who gave verbal consent to proceed.  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 The patient describes the pain as a "lightning bolt" sensation in the lower back, radiating up the right side and into the buttocks. The pain has also caused stiffness in the neck and right shoulder. The patient reports that the pain is worse with movement, particularly bending and twisting, and is relieved by rest. The patient has been managing the pain with stretching exercises, hot baths, and tramadol , but the pain has become limiting and the patient is considering physical therapy. He denies fevers, urinary incontinence.      ROS See pertinent positives and negatives per HPI.     Objective:    BP 128/84 (BP Location: Left Arm, Patient Position: Sitting, Cuff Size: Normal)   Pulse 66   Temp (!) 97.4 F (36.3 C)   Wt 216 lb 12.8 oz (98.3 kg)   SpO2 95%   BMI 29.40 kg/m     Physical Exam Vitals and nursing note reviewed.  Constitutional:      Appearance: Normal appearance.  HENT:     Head: Normocephalic.  Eyes:     Conjunctiva/sclera: Conjunctivae normal.  Pulmonary:     Effort: Pulmonary effort is normal.  Musculoskeletal:        General: No swelling or tenderness.     Cervical back: Normal range of motion.     Comments: Lumbar ROM limited due to pain. Straight leg raise positive on right  Skin:    General: Skin is warm.  Neurological:     General: No focal deficit present.     Mental Status: He is alert and  oriented to person, place, and time.  Psychiatric:        Mood and Affect: Mood normal.        Behavior: Behavior normal.        Thought Content: Thought content normal.        Judgment: Judgment normal.       Assessment & Plan:   Problem List Items Addressed This Visit       Other   Radicular low back pain - Primary   Chronic low back pain has acutely worsened, with pain radiating to the right buttock and leg, accompanied by muscle spasms. Previous treatment with prednisone  and tramadol  was effective, however pain returned after another injury 2 weeks ago. Current pain is severe, limiting function. Will give kenalog  40mg  IM in office today. Prescribe tramadol  50mg  every eight hours as needed for pain and flexeril  10mg  every eight hours as needed, with caution for drowsiness. PDMP reviewed. Refer to physical therapy for management and rehabilitation with targeted exercises. Follow-up with PCP in 6 weeks.        Relevant Medications   cyclobenzaprine  (FLEXERIL ) 10 MG tablet   Other Relevant Orders   Ambulatory referral to Physical Therapy    Meds ordered this encounter  Medications   cyclobenzaprine  (FLEXERIL ) 10 MG  tablet    Sig: Take 1 tablet (10 mg total) by mouth 3 (three) times daily as needed for muscle spasms.    Dispense:  40 tablet    Refill:  0   traMADol  (ULTRAM ) 50 MG tablet    Sig: Take 1 tablet (50 mg total) by mouth every 8 (eight) hours as needed for up to 5 days for severe pain (pain score 7-10).    Dispense:  15 tablet    Refill:  0   triamcinolone  acetonide (KENALOG -40) injection 40 mg    Return in about 6 weeks (around 11/12/2023) for with PCP for back pain.  Odette Benjamin, NP

## 2023-10-01 NOTE — Patient Instructions (Addendum)
 It was great to see you!  I have placed a referral for PT  We are giving you a steroid injection today  Start tramadol  and flexeril  every 8 hours as needed   Let's follow-up in 6 weeks with Dr. Tilmon Font, sooner if you have concerns.  If a referral was placed today, you will be contacted for an appointment. Please note that routine referrals can sometimes take up to 3-4 weeks to process. Please call our office if you haven't heard anything after this time frame.  Take care,  Rheba Cedar, NP

## 2023-10-07 ENCOUNTER — Other Ambulatory Visit: Payer: Self-pay | Admitting: Cardiology

## 2023-10-07 NOTE — Therapy (Unsigned)
 OUTPATIENT PHYSICAL THERAPY THORACOLUMBAR EVALUATION   Patient Name: Alex Boyle MRN: [id number] DOB:25-May-1946, 78 y.o., male Today's Date: 10/08/2023  END OF SESSION:  PT End of Session - 10/08/23 1838     Visit Number 1    Number of Visits 16    Date for PT Re-Evaluation 12/03/23    Authorization Type BCBS Federal primary; medicare secondary    Progress Note Due on Visit 10    PT Start Time 1448    PT Stop Time 1536    PT Time Calculation (min) 48 min    Activity Tolerance Patient tolerated treatment well             Past Medical History:  Diagnosis Date   Allergy    Arthritis    Asthma    COPD (chronic obstructive pulmonary disease) (HCC)    boderline per pt   Diverticulitis    Hyperlipidemia    Hypertension    Myocardial infarction (HCC)    Sleep apnea    uses bipap   Sleep apnea in adult    Past Surgical History:  Procedure Laterality Date   COLON SURGERY     COLONOSCOPY     CORONARY STENT PLACEMENT     x2   FOOT SURGERY     JOINT REPLACEMENT     knee   LAPAROSCOPIC TRANSABDOMINAL HERNIA     MANDIBLE SURGERY     skin cancer     basil cell removed on back   UPPER GASTROINTESTINAL ENDOSCOPY     Patient Active Problem List   Diagnosis Date Noted   Radicular low back pain 10/01/2023   Bronchitis 06/13/2021   Healthcare maintenance 11/14/2020   History of colon polyps 11/14/2020   Barrett's esophagus with dysplasia 11/14/2020   Neuropathy 11/14/2020   Asthma exacerbation 04/22/2019   Lumbar strain 04/22/2019   Venous insufficiency 01/08/2019   Numbness and tingling of both feet 01/08/2019   History of basal cell cancer 01/08/2019   Hyperlipidemia LDL goal <70 10/13/2018   Essential hypertension 09/23/2018   CAD (coronary artery disease) 09/23/2018   Asthma 09/23/2018   OSA (obstructive sleep apnea) 09/23/2018   Benign prostatic hyperplasia with lower urinary tract symptoms 09/23/2018    PCP: Dr Trecia Friends  REFERRING  PROVIDER: Odette Benjamin, NP  REFERRING DIAG: Radicular LBP  Rationale for Evaluation and Treatment: Rehabilitation  THERAPY DIAG:  Acute right-sided low back pain with right-sided sciatica - Plan: PT plan of care cert/re-cert  Other symptoms and signs involving the musculoskeletal system - Plan: PT plan of care cert/re-cert  Muscle weakness (generalized) - Plan: PT plan of care cert/re-cert  Abnormal posture - Plan: PT plan of care cert/re-cert  ONSET DATE: 08/22/23; 09/27/23  SUBJECTIVE:  SUBJECTIVE STATEMENT: History of chronic LBP increased recently and treated with prednisone  and tramadol  with improvement. Re-injured ~2-3 weeks ago with pain becoming more severe. Initially injured LB 08/22/23 lifting something in his yard. Doing better with meds and rest until re-injury 09/27/23 when twisting with box at the food bank. Pain is the "worst it's ever been" and has not subsided with meds or usual exercises. He has tried hot baths, heating pad and meds with no change. Pain is worse on the R LB than L. He has pain in the R buttock and posterolateral hip as well as into to the R shoulder and neck.   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 Yes: NPRS scale: 5-6/10  Pain location: R > L LB; R posterior hip to lateral hip; pain into the R shoulder and neck  Pain description: aching Aggravating factors: moving; turning to R; lifting R LE; lying down to sleep at night; rolling over  Relieving factors: hot bath for 20-30 min; heating  pad temporary relief; walking slow pace OK   PRECAUTIONS: None  RED FLAGS: None   WEIGHT BEARING RESTRICTIONS: No  FALLS:  Has patient fallen in last 6 months? 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  DIAGNOSTIC FINDINGS:  None for lumbar spine   PATIENT SURVEYS:  Modified Oswestry 34/50; 68%  COGNITION: Overall cognitive status: Within functional limits for tasks assessed     SENSATION: WFL  MUSCLE LENGTH: Hamstrings: Right 65 deg; Left 70 deg Thomas test: tight right > left  POSTURE: rounded shoulders, forward head, decreased lumbar lordosis, increased thoracic kyphosis, and flexed trunk   PALPATION: Pain with PA mobs lower lumbar spine; tightness R > L lumbar musculature; posterior hip/buttock  LUMBAR ROM:   AROM eval  Flexion 20% using hands on thighs up/down pain R LB  Extension 20% no pain   Right lateral flexion 50% no pain  Left lateral flexion 45% pain R LB  Right rotation 20%   Left rotation 20%    (Blank rows = not tested)  LOWER EXTREMITY ROM:  limited mobility R > L throughout hip especially in rotation  Active  Right eval Left eval  Hip flexion    Hip extension    Hip abduction    Hip adduction    Hip internal rotation    Hip external rotation    Knee flexion    Knee extension    Ankle dorsiflexion    Ankle  plantarflexion    Ankle inversion    Ankle eversion     (Blank rows = not tested)  LOWER EXTREMITY MMT:    MMT Right eval Left eval  Hip flexion 4- 4  Hip extension 4- 4-  Hip abduction 3+ 4-  Hip adduction    Hip internal rotation    Hip external rotation    Knee flexion    Knee extension    Ankle dorsiflexion    Ankle plantarflexion    Ankle inversion    Ankle eversion     (Blank rows = not tested)  LUMBAR SPECIAL TESTS:  Straight leg raise test: Negative and Slump test: Negative  FUNCTIONAL TESTS:  5 times sit to stand: 13.33 sec R LB pain   GAIT: Distance walked: 40 feet Assistive device utilized: None Level of assistance: Complete Independence Comments: antalgic gait; trunk flexed forward; decreased weight bearing phase R LE in stance   TREATMENT DATE: 10/08/23                                                                                                                                Review of evaluation findings; POC, HEP   PATIENT EDUCATION:  Education details: POC; HEP  Person educated: Patient Education method: Explanation, Demonstration, Tactile cues, Verbal cues, and Handouts Education comprehension: verbalized understanding, returned demonstration, verbal cues required, tactile cues required, and needs further education  HOME EXERCISE PROGRAM: Access Code: [email or web address] URL: [email or web address] Date: 10/08/2023 Prepared by: Leatrice Parilla  Exercises - Standing Lumbar Extension  - 2 x daily - 7 x weekly - 1 sets - 2-3 reps - 2-3 sec  hold  Patient Education - Hospital doctor - Office Posture  ASSESSMENT:  CLINICAL IMPRESSION: Patient is a 78 y.o. male who was seen today for physical therapy evaluation and treatment for radicular LBP. He has a history of chronic, recurrent LBP with R LE radicular symptoms. Recent flare up of symptoms 08/22/23 initially responded to medication and rest with partial resolution until patient  re-injured back lifting and twisting when moving a box 09/27/23. He has more sever pain with pain radiating into the R posterior hip/buttock and at times into the R LE. He demonstrated centralization of symptoms with extension through lumbar spine and sitting with 3 inch foam roll along spine vertically. He has poor lumbar and LE ROM/mobility; significant core weakness; LE weakness; poor body mechanics; abnormal posture; limited functional activities and ADL's due to pain. Patient will benefit from PT to address deficits identified.    OBJECTIVE IMPAIRMENTS: Abnormal gait, decreased activity tolerance, decreased balance, decreased mobility, decreased ROM, decreased strength, impaired flexibility, improper body mechanics, postural dysfunction, and pain.   ACTIVITY LIMITATIONS: carrying, lifting, bending, sitting, standing, squatting, sleeping, transfers, bed mobility, and dressing  PARTICIPATION LIMITATIONS: meal prep, cleaning, laundry, driving, and shopping  PERSONAL FACTORS: Behavior pattern, Fitness, Past/current experiences, Profession, Time since onset of injury/illness/exacerbation, and  comorbidities: chronic LBP; multiple abdominal surgeries; LE surgeries  are also affecting patient's functional outcome.   REHAB POTENTIAL: Good  CLINICAL DECISION MAKING: Evolving/moderate  complexity  EVALUATION COMPLEXITY: Moderate   GOALS: Goals reviewed with patient? Yes  SHORT TERM GOALS: Target date: 11/05/2023   Independent in initial HEP Baseline: Goal status: INITIAL  2.  Increase trunk ROM/mobility to WFL's for functional activities  Baseline:  Goal status: INITIAL  3.  Independent in transfers and transitional movements with proper body mechanics  Baseline:  Goal status: INITIAL   LONG TERM GOALS: Target date: 12/03/2023   Decrease LBP and R LE pain by 75% allowing patient to return to normal activities  Baseline:  Goal status: INITIAL  2.  Increase strength bilat LE's to 4+/5   Baseline:  Goal status: INITIAL  3.  Patient demonstrates/reports ability to walk for 20-30 minutes without increased pain/LE symptoms  Baseline:  Goal status: INITIAL  4.  Independent in HEP including aquatic program as indicated  Baseline:  Goal status: INITIAL  5.  Improve Modified Owestry score by 10-15 points   Baseline: 34/50; 68%  Goal status: INITIAL   PLAN:  PT FREQUENCY: 2x/week  PT DURATION: 8 weeks  PLANNED INTERVENTIONS: 97110-Therapeutic exercises, 97530- Therapeutic activity, 97112- Neuromuscular re-education, 97535- Self Care, 29562- Manual therapy, [phone]- Aquatic Therapy, Patient/Family education, Dry Needling, Joint mobilization, Cryotherapy, and Moist heat.  PLAN FOR NEXT SESSION: review and progress with exercises; continue spine care and body mechanics education; manual work and modalities as indicated    Henriette Hesser P Sonia Stickels, PT 10/08/2023, 7:18 PM

## 2023-10-07 NOTE — Therapy (Incomplete)
 OUTPATIENT PHYSICAL THERAPY THORACOLUMBAR EVALUATION   Patient Name: Alex Boyle MRN: [id number] DOB:05-28-1946, 78 y.o., male Today's Date: 10/07/2023  END OF SESSION:   Past Medical History:  Diagnosis Date   Allergy    Arthritis    Asthma    COPD (chronic obstructive pulmonary disease) (HCC)    boderline per pt   Diverticulitis    Hyperlipidemia    Hypertension    Myocardial infarction (HCC)    Sleep apnea    uses bipap   Sleep apnea in adult    Past Surgical History:  Procedure Laterality Date   COLON SURGERY     COLONOSCOPY     CORONARY STENT PLACEMENT     x2   FOOT SURGERY     JOINT REPLACEMENT     knee   LAPAROSCOPIC TRANSABDOMINAL HERNIA     MANDIBLE SURGERY     skin cancer     basil cell removed on back   UPPER GASTROINTESTINAL ENDOSCOPY     Patient Active Problem List   Diagnosis Date Noted   Radicular low back pain 10/01/2023   Bronchitis 06/13/2021   Healthcare maintenance 11/14/2020   History of colon polyps 11/14/2020   Barrett's esophagus with dysplasia 11/14/2020   Neuropathy 11/14/2020   Asthma exacerbation 04/22/2019   Lumbar strain 04/22/2019   Venous insufficiency 01/08/2019   Numbness and tingling of both feet 01/08/2019   History of basal cell cancer 01/08/2019   Hyperlipidemia LDL goal <70 10/13/2018   Essential hypertension 09/23/2018   CAD (coronary artery disease) 09/23/2018   Asthma 09/23/2018   OSA (obstructive sleep apnea) 09/23/2018   Benign prostatic hyperplasia with lower urinary tract symptoms 09/23/2018    PCP: Dr Tonna Frederic  REFERRING PROVIDER: Rheba Cedar, NP  REFERRING DIAG: Radicular LBP  Rationale for Evaluation and Treatment: Rehabilitation  THERAPY DIAG:  No diagnosis found.  ONSET DATE: ***  SUBJECTIVE:                                                                                                                                                                                            SUBJECTIVE STATEMENT: Chronic LBP with acute flare up of pain radiating into the R buttock and leg; muscle spasms. Treated with prednisone  and tramadol  with improvement but patient re-injured his back ~ 2-3 weeks ago. Pain is severe, limiting function.    PERTINENT HISTORY:  Chronic LBP; sleep apnea using bipap; borderline COPD; CAD with 2 stent placements; TKA; R foot pain   PAIN:  Are you having pain? Yes: NPRS scale: *** Pain location: *** Pain description: *** Aggravating factors: *** Relieving  factors: ***  PRECAUTIONS: None  RED FLAGS: None   WEIGHT BEARING RESTRICTIONS: No  FALLS:  Has patient fallen in last 6 months? {fallsyesno:27318}  LIVING ENVIRONMENT: Lives with: lives with their spouse Lives in: House/apartment Stairs: {opstairs:27293} Has following equipment at home: {Assistive devices:23999}  OCCUPATION: ***  PLOF: Independent  PATIENT GOALS: ***  NEXT MD VISIT: ***  OBJECTIVE:  Note: Objective measures were completed at Evaluation unless otherwise noted.  DIAGNOSTIC FINDINGS:  MRI R foot 8/22 IMPRESSION: 1. Abnormal T1 and T2 signal intensity in the proximal and middle phalanges of the second toe could be due to recent surgery/stress reaction. Could not exclude the possibility of osteomyelitis particularly with history of draining ulcer. 2. Mild edema like signal changes in the distal phalanx of the fifth toe also but no surrounding inflammatory changes. 3. Moderate midfoot degenerative changes.  PATIENT SURVEYS:  Modified Oswestry ***   COGNITION: Overall cognitive status: Within functional limits for tasks assessed     SENSATION: {sensation:27233}  MUSCLE LENGTH: Hamstrings: Right *** deg; Left *** deg Andy Bannister test: Right *** deg; Left *** deg  POSTURE: {posture:25561}  PALPATION: ***  LUMBAR ROM:   AROM eval  Flexion   Extension   Right lateral flexion   Left lateral flexion   Right rotation   Left rotation     (Blank rows = not tested)  LOWER EXTREMITY ROM:     {AROM/PROM:27142}  Right eval Left eval  Hip flexion    Hip extension    Hip abduction    Hip adduction    Hip internal rotation    Hip external rotation    Knee flexion    Knee extension    Ankle dorsiflexion    Ankle plantarflexion    Ankle inversion    Ankle eversion     (Blank rows = not tested)  LOWER EXTREMITY MMT:    MMT Right eval Left eval  Hip flexion    Hip extension    Hip abduction    Hip adduction    Hip internal rotation    Hip external rotation    Knee flexion    Knee extension    Ankle dorsiflexion    Ankle plantarflexion    Ankle inversion    Ankle eversion     (Blank rows = not tested)  LUMBAR SPECIAL TESTS:  {lumbar special test:25242}  FUNCTIONAL TESTS:  {Functional tests:24029}  GAIT: Distance walked: *** Assistive device utilized: {Assistive devices:23999} Level of assistance: {Levels of assistance:24026} Comments: ***  TREATMENT DATE: ***                                                                                                                                 PATIENT EDUCATION:  Education details: *** Person educated: {Person educated:25204} Education method: {Education Method:25205} Education comprehension: {Education Comprehension:25206}  HOME EXERCISE PROGRAM: ***  ASSESSMENT:  CLINICAL IMPRESSION: Patient is a *** y.o. *** who was seen today  for physical therapy evaluation and treatment for ***.   OBJECTIVE IMPAIRMENTS: {opptimpairments:25111}.   ACTIVITY LIMITATIONS: {activitylimitations:27494}  PARTICIPATION LIMITATIONS: {participationrestrictions:25113}  PERSONAL FACTORS: {Personal factors:25162} are also affecting patient's functional outcome.   REHAB POTENTIAL: {rehabpotential:25112}  CLINICAL DECISION MAKING: {clinical decision making:25114}  EVALUATION COMPLEXITY: {Evaluation complexity:25115}   GOALS: Goals reviewed with patient?  {yes/no:20286}  SHORT TERM GOALS: Target date: ***  *** Baseline: Goal status: INITIAL  2.  *** Baseline:  Goal status: INITIAL  3.  *** Baseline:  Goal status: INITIAL  4.  *** Baseline:  Goal status: INITIAL  5.  *** Baseline:  Goal status: INITIAL  6.  *** Baseline:  Goal status: INITIAL  LONG TERM GOALS: Target date: ***  *** Baseline:  Goal status: INITIAL  2.  *** Baseline:  Goal status: INITIAL  3.  *** Baseline:  Goal status: INITIAL  4.  *** Baseline:  Goal status: INITIAL  5.  *** Baseline:  Goal status: INITIAL  6.  *** Baseline:  Goal status: INITIAL  PLAN:  PT FREQUENCY: {rehab frequency:25116}  PT DURATION: {rehab duration:25117}  PLANNED INTERVENTIONS: {rehab planned interventions:25118::"97110-Therapeutic exercises","97530- Therapeutic [phone]- Neuromuscular re-education","97535- Self MVHQ","46962- Manual therapy"}.  PLAN FOR NEXT SESSION: ***   Alexie Lanni P Anet Logsdon, PT 10/07/2023, 5:43 PM

## 2023-10-08 ENCOUNTER — Encounter: Payer: Self-pay | Admitting: Rehabilitative and Restorative Service Providers"

## 2023-10-08 ENCOUNTER — Ambulatory Visit: Attending: Nurse Practitioner | Admitting: Rehabilitative and Restorative Service Providers"

## 2023-10-08 ENCOUNTER — Other Ambulatory Visit: Payer: Self-pay

## 2023-10-08 DIAGNOSIS — M5441 Lumbago with sciatica, right side: Secondary | ICD-10-CM | POA: Diagnosis present

## 2023-10-08 DIAGNOSIS — M5410 Radiculopathy, site unspecified: Secondary | ICD-10-CM | POA: Insufficient documentation

## 2023-10-08 DIAGNOSIS — R293 Abnormal posture: Secondary | ICD-10-CM | POA: Insufficient documentation

## 2023-10-08 DIAGNOSIS — M6281 Muscle weakness (generalized): Secondary | ICD-10-CM | POA: Diagnosis present

## 2023-10-08 DIAGNOSIS — R29898 Other symptoms and signs involving the musculoskeletal system: Secondary | ICD-10-CM | POA: Insufficient documentation

## 2023-10-09 ENCOUNTER — Encounter: Payer: Self-pay | Admitting: Rehabilitative and Restorative Service Providers"

## 2023-10-10 ENCOUNTER — Ambulatory Visit: Attending: Nurse Practitioner

## 2023-10-10 DIAGNOSIS — M5441 Lumbago with sciatica, right side: Secondary | ICD-10-CM | POA: Diagnosis present

## 2023-10-10 DIAGNOSIS — M6281 Muscle weakness (generalized): Secondary | ICD-10-CM | POA: Diagnosis present

## 2023-10-10 DIAGNOSIS — R293 Abnormal posture: Secondary | ICD-10-CM | POA: Insufficient documentation

## 2023-10-10 DIAGNOSIS — R29898 Other symptoms and signs involving the musculoskeletal system: Secondary | ICD-10-CM | POA: Insufficient documentation

## 2023-10-10 DIAGNOSIS — R252 Cramp and spasm: Secondary | ICD-10-CM | POA: Diagnosis present

## 2023-10-10 NOTE — Therapy (Signed)
 OUTPATIENT PHYSICAL THERAPY THORACOLUMBAR TREATMENT   Patient Name: Alex Boyle MRN: [id number] DOB:[birth year], 78 y.o., male Today's Date: 10/10/2023  END OF SESSION:  PT End of Session - 10/10/23 1717     Visit Number 2    Number of Visits 16    Date for PT Re-Evaluation 12/03/23    Authorization Type BCBS Federal primary; medicare secondary    Progress Note Due on Visit 10    PT Start Time 1402    PT Stop Time 1445    PT Time Calculation (min) 43 min    Activity Tolerance Patient tolerated treatment well              Past Medical History:  Diagnosis Date   Allergy    Arthritis    Asthma    COPD (chronic obstructive pulmonary disease) (HCC)    boderline per pt   Diverticulitis    Hyperlipidemia    Hypertension    Myocardial infarction (HCC)    Sleep apnea    uses bipap   Sleep apnea in adult    Past Surgical History:  Procedure Laterality Date   COLON SURGERY     COLONOSCOPY     CORONARY STENT PLACEMENT     x2   FOOT SURGERY     JOINT REPLACEMENT     knee   LAPAROSCOPIC TRANSABDOMINAL HERNIA     MANDIBLE SURGERY     skin cancer     basil cell removed on back   UPPER GASTROINTESTINAL ENDOSCOPY     Patient Active Problem List   Diagnosis Date Noted   Radicular low back pain 10/01/2023   Bronchitis 06/13/2021   Healthcare maintenance 11/14/2020   History of colon polyps 11/14/2020   Barrett's esophagus with dysplasia 11/14/2020   Neuropathy 11/14/2020   Asthma exacerbation 04/22/2019   Lumbar strain 04/22/2019   Venous insufficiency 01/08/2019   Numbness and tingling of both feet 01/08/2019   History of basal cell cancer 01/08/2019   Hyperlipidemia LDL goal <70 10/13/2018   Essential hypertension 09/23/2018   CAD (coronary artery disease) 09/23/2018   Asthma 09/23/2018   OSA (obstructive sleep apnea) 09/23/2018   Benign prostatic hyperplasia with lower urinary tract symptoms 09/23/2018    PCP: Dr Trecia Friends  REFERRING  PROVIDER: Odette Benjamin, NP  REFERRING DIAG: Radicular LBP  Rationale for Evaluation and Treatment: Rehabilitation  THERAPY DIAG:  Acute right-sided low back pain with right-sided sciatica  Other symptoms and signs involving the musculoskeletal system  Muscle weakness (generalized)  Abnormal posture  Cramp and spasm  ONSET DATE: 08/22/23; 09/27/23  SUBJECTIVE:  SUBJECTIVE STATEMENT: 10/10/2023: The patient returned to the clinic and reviewed his chief complaint. He stated this episode of his low back pain "is the worst it has ever been" and he is still having a lot of pain. He has been working on his home exercise, 2 sets of 3 reps, and works to keep his knees straight when he leans backward - it is more comfortable with the knees bent.   Evaluation: History of chronic LBP increased recently and treated with prednisone  and tramadol  with improvement. Re-injured ~2-3 weeks ago with pain becoming more severe. Initially injured LB 08/22/23 lifting something in his yard. Doing better with meds and rest until re-injury 09/27/23 when twisting with box at the food bank. Pain is the "worst it's ever been" and has not subsided with meds or usual exercises. He has tried hot baths, heating pad and meds with no change. Pain is worse on the R LB than L. He has pain in the R buttock and posterolateral hip as well as into to the R shoulder and neck.   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 Yes: NPRS scale: 5-6/10  Pain location: R > L LB;  R posterior hip to lateral hip; pain into the R shoulder and neck  Pain description: aching Aggravating factors: moving; turning to R; lifting R LE; lying down to sleep at night; rolling over  Relieving factors: hot bath for 20-30 min; heating pad temporary relief; walking slow pace OK   PRECAUTIONS: None  RED FLAGS: None   WEIGHT BEARING RESTRICTIONS: No  FALLS:  Has patient fallen in last 6 months? 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  DIAGNOSTIC FINDINGS:  None for lumbar spine   PATIENT SURVEYS:  Modified Oswestry 34/50; 68%  COGNITION: Overall cognitive status: Within functional limits for tasks assessed     SENSATION: WFL  MUSCLE LENGTH: Hamstrings: Right 65 deg; Left 70 deg Thomas test: tight right > left  POSTURE: rounded shoulders, forward head, decreased lumbar lordosis, increased thoracic kyphosis, and flexed trunk   PALPATION: Pain with PA mobs lower lumbar spine; tightness R > L lumbar musculature; posterior hip/buttock  LUMBAR ROM:   AROM eval  Flexion 20% using hands on thighs up/down pain R LB  Extension 20% no pain   Right lateral flexion 50% no pain  Left lateral flexion 45% pain R LB  Right rotation 20%   Left rotation 20%    (Blank rows = not tested)  LOWER EXTREMITY ROM:  limited mobility R > L throughout hip especially  in rotation  Active  Right  eval Left eval  Hip flexion    Hip extension    Hip abduction    Hip adduction    Hip internal rotation    Hip external rotation    Knee flexion    Knee extension    Ankle dorsiflexion    Ankle plantarflexion    Ankle inversion    Ankle eversion     (Blank rows = not tested)  LOWER EXTREMITY MMT:    MMT Right eval Left eval  Hip flexion 4- 4  Hip extension 4- 4-  Hip abduction 3+ 4-  Hip adduction    Hip internal rotation    Hip external rotation    Knee flexion    Knee extension    Ankle dorsiflexion    Ankle plantarflexion    Ankle inversion    Ankle eversion     (Blank rows = not tested)  LUMBAR SPECIAL TESTS:  Straight leg raise test: Negative and Slump test: Negative  FUNCTIONAL TESTS:  5 times sit to stand: 13.33 sec R LB pain   GAIT: Distance walked: 40 feet Assistive device utilized: None Level of assistance: Complete Independence Comments: antalgic gait; trunk flexed forward; decreased weight bearing phase R LE in stance   [facility] Adult PT Treatment:                                                DATE: 10/10/2023 Therapeutic Exercise: Supine Manual stretching of the R hip into flexion + adduction and flexion + abduction + external rotation to stretch the posterior/posterolateral hip Manual Therapy: L sidelying: Soft tissue mobilization to the muscles of the R posterior/posterolaterap hip Soft tissue mobilization to the R quadratus lumborum/lumbar paraspinals Caudal glides to the R innominate (hip drop) Cephalad glides to the R lower rib cage  [facility] Adult PT Treatment:                                                DATE: 10/08/2023 Review of evaluation findings; POC, HEP   PATIENT EDUCATION:  Education details: POC; HEP  Person educated: Patient Education method: Explanation, Demonstration, Tactile cues, Verbal cues, and Handouts Education comprehension: verbalized understanding, returned demonstration, verbal cues  required, tactile cues required, and needs further education  HOME EXERCISE PROGRAM: Access Code: [email or web address] URL: [email or web address] Date: 10/08/2023 Prepared by: Celyn Holt  Exercises - Standing Lumbar Extension  - 2 x daily - 7 x weekly - 1 sets - 2-3 reps - 2-3 sec  hold  Patient Education - Hospital doctor - Office Posture  ASSESSMENT:  CLINICAL IMPRESSION: Focused treatment today on improving soft tissue and joint mobility of the R lumbopelvic region which was noted to be restricted. Mobility improved with treatment, particularly the ability of the R innominate to move caudally. Finished the session by stretching the R posterior/posterolateral hip which were muscles addressed early with soft tissue mobilization. Patient reported a notable reduction of symptoms when ambulating out of the clinic. Physical therapy remains indicated.   Evaluation: Patient is a 78 y.o. male who was seen today for physical therapy evaluation and treatment for radicular LBP. He has a history of chronic, recurrent LBP with R LE radicular symptoms. Recent flare up of symptoms 08/22/23 initially  responded to medication and rest with partial resolution until patient re-injured back lifting and twisting when moving a box 09/27/23. He has more sever pain with pain radiating into the R posterior hip/buttock and at times into the R LE. He demonstrated centralization of symptoms with extension through lumbar spine and sitting with 3 inch foam roll along spine vertically. He has poor lumbar and LE ROM/mobility; significant core weakness; LE weakness; poor body mechanics; abnormal posture; limited functional activities and ADL's due to pain. Patient will benefit from PT to address deficits identified.    OBJECTIVE IMPAIRMENTS: Abnormal gait, decreased activity tolerance, decreased balance, decreased mobility, decreased ROM, decreased strength, impaired flexibility, improper body mechanics, postural  dysfunction, and pain.   ACTIVITY LIMITATIONS: carrying, lifting, bending, sitting, standing, squatting, sleeping, transfers, bed mobility, and dressing  PARTICIPATION LIMITATIONS: meal prep, cleaning, laundry, driving, and shopping  PERSONAL FACTORS: Behavior pattern, Fitness, Past/current experiences, Profession, Time since onset of injury/illness/exacerbation, and  comorbidities: chronic LBP; multiple abdominal surgeries; LE surgeries  are also affecting patient's functional outcome.   REHAB POTENTIAL: Good  CLINICAL DECISION MAKING: Evolving/moderate complexity  EVALUATION COMPLEXITY: Moderate   GOALS: Goals reviewed with patient? Yes  SHORT TERM GOALS: Target date: 11/05/2023   Independent in initial HEP Baseline: Goal status: INITIAL  2.  Increase trunk ROM/mobility to WFL's for functional activities  Baseline:  Goal status: INITIAL  3.  Independent in transfers and transitional movements with proper body mechanics  Baseline:  Goal status: INITIAL   LONG TERM GOALS: Target date: 12/03/2023   Decrease LBP and R LE pain by 75% allowing patient to return to normal activities  Baseline:  Goal status: INITIAL  2.  Increase strength bilat LE's to 4+/5  Baseline:  Goal status: INITIAL  3.  Patient demonstrates/reports ability to walk for 20-30 minutes without increased pain/LE symptoms  Baseline:  Goal status: INITIAL  4.  Independent in HEP including aquatic program as indicated  Baseline:  Goal status: INITIAL  5.  Improve Modified Owestry score by 10-15 points   Baseline: 34/50; 68%  Goal status: INITIAL   PLAN:  PT FREQUENCY: 2x/week  PT DURATION: 8 weeks  PLANNED INTERVENTIONS: 97110-Therapeutic exercises, 97530- Therapeutic activity, 97112- Neuromuscular re-education, 97535- Self Care, 16109- Manual therapy, [phone]- Aquatic Therapy, Patient/Family education, Dry Needling, Joint mobilization, Cryotherapy, and Moist heat.  PLAN FOR NEXT SESSION:  review and progress with exercises; continue spine care and body mechanics education; manual work and modalities as indicated    Zoe Hinds, PT 10/10/2023, 5:19 PM

## 2023-10-15 ENCOUNTER — Ambulatory Visit: Admitting: Rehabilitative and Restorative Service Providers"

## 2023-10-17 ENCOUNTER — Ambulatory Visit

## 2023-10-17 DIAGNOSIS — M5441 Lumbago with sciatica, right side: Secondary | ICD-10-CM | POA: Diagnosis not present

## 2023-10-17 DIAGNOSIS — R29898 Other symptoms and signs involving the musculoskeletal system: Secondary | ICD-10-CM

## 2023-10-17 DIAGNOSIS — M6281 Muscle weakness (generalized): Secondary | ICD-10-CM

## 2023-10-17 DIAGNOSIS — R252 Cramp and spasm: Secondary | ICD-10-CM

## 2023-10-17 DIAGNOSIS — R293 Abnormal posture: Secondary | ICD-10-CM

## 2023-10-17 NOTE — Therapy (Signed)
 OUTPATIENT PHYSICAL THERAPY THORACOLUMBAR TREATMENT   Patient Name: Alex Boyle MRN: [id number] DOB:1945-12-17, 78 y.o., male Today's Date: 10/17/2023  END OF SESSION:  PT End of Session - 10/17/23 1356     Visit Number 3    Number of Visits 16    Date for PT Re-Evaluation 12/03/23    Authorization Type BCBS Federal primary; medicare secondary    Progress Note Due on Visit 10    PT Start Time 1400    PT Stop Time 1440    PT Time Calculation (min) 40 min              Past Medical History:  Diagnosis Date   Allergy    Arthritis    Asthma    COPD (chronic obstructive pulmonary disease) (HCC)    boderline per pt   Diverticulitis    Hyperlipidemia    Hypertension    Myocardial infarction (HCC)    Sleep apnea    uses bipap   Sleep apnea in adult    Past Surgical History:  Procedure Laterality Date   COLON SURGERY     COLONOSCOPY     CORONARY STENT PLACEMENT     x2   FOOT SURGERY     JOINT REPLACEMENT     knee   LAPAROSCOPIC TRANSABDOMINAL HERNIA     MANDIBLE SURGERY     skin cancer     basil cell removed on back   UPPER GASTROINTESTINAL ENDOSCOPY     Patient Active Problem List   Diagnosis Date Noted   Radicular low back pain 10/01/2023   Bronchitis 06/13/2021   Healthcare maintenance 11/14/2020   History of colon polyps 11/14/2020   Barrett's esophagus with dysplasia 11/14/2020   Neuropathy 11/14/2020   Asthma exacerbation 04/22/2019   Lumbar strain 04/22/2019   Venous insufficiency 01/08/2019   Numbness and tingling of both feet 01/08/2019   History of basal cell cancer 01/08/2019   Hyperlipidemia LDL goal <70 10/13/2018   Essential hypertension 09/23/2018   CAD (coronary artery disease) 09/23/2018   Asthma 09/23/2018   OSA (obstructive sleep apnea) 09/23/2018   Benign prostatic hyperplasia with lower urinary tract symptoms 09/23/2018    PCP: Dr Trecia Friends  REFERRING PROVIDER: Odette Benjamin, NP  REFERRING DIAG:  Radicular LBP  Rationale for Evaluation and Treatment: Rehabilitation  THERAPY DIAG:  Acute right-sided low back pain with right-sided sciatica  Other symptoms and signs involving the musculoskeletal system  Muscle weakness (generalized)  Abnormal posture  Cramp and spasm  ONSET DATE: 08/22/23; 09/27/23  SUBJECTIVE:  SUBJECTIVE STATEMENT: 10/17/2023: The patient returned to the clinic stating he felt great after last treatment session. He was able to drive to the beach with his wife for her birthday and walk on the sand without any issues. When they returned home yesterday, he did a lot of house work. His symptoms did not bother him at that time, but he woke up this morning in a lot of pain. His wife had to help him get out of bed.  Evaluation: History of chronic LBP increased recently and treated with prednisone  and tramadol  with improvement. Re-injured ~2-3 weeks ago with pain becoming more severe. Initially injured LB 08/22/23 lifting something in his yard. Doing better with meds and rest until re-injury 09/27/23 when twisting with box at the food bank. Pain is the "worst it's ever been" and has not subsided with meds or usual exercises. He has tried hot baths, heating pad and meds with no change. Pain is worse on the R LB than L. He has pain in the R buttock and posterolateral hip as well as into to the R shoulder and neck.   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 Yes: NPRS scale: 5-6/10  Pain location: R > L LB; R posterior  hip to lateral hip; pain into the R shoulder and neck  Pain description: aching Aggravating factors: moving; turning to R; lifting R LE; lying down to sleep at night; rolling over  Relieving factors: hot bath for 20-30 min; heating pad temporary relief; walking slow pace OK   PRECAUTIONS: None  RED FLAGS: None   WEIGHT BEARING RESTRICTIONS: No  FALLS:  Has patient fallen in last 6 months? 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  DIAGNOSTIC FINDINGS:  None for lumbar spine   PATIENT SURVEYS:  Modified Oswestry 34/50; 68%  COGNITION: Overall cognitive status: Within functional limits for tasks assessed     SENSATION: WFL  MUSCLE LENGTH: Hamstrings: Right 65 deg; Left 70 deg Thomas test: tight right > left  POSTURE: rounded shoulders, forward head, decreased lumbar lordosis, increased thoracic kyphosis, and flexed trunk   PALPATION: Pain with PA mobs lower lumbar spine; tightness R > L lumbar musculature; posterior hip/buttock  LUMBAR ROM:   AROM eval  Flexion 20% using hands on thighs up/down pain R LB  Extension 20% no pain   Right lateral flexion 50% no pain  Left lateral flexion 45% pain R LB  Right rotation 20%   Left rotation 20%    (Blank rows = not tested)  LOWER EXTREMITY ROM:  limited mobility R > L throughout hip  especially in  rotation  Active  Right eval Left eval  Hip flexion    Hip extension    Hip abduction    Hip adduction    Hip internal rotation    Hip external rotation    Knee flexion    Knee extension    Ankle dorsiflexion    Ankle plantarflexion    Ankle inversion    Ankle eversion     (Blank rows = not tested)  LOWER EXTREMITY MMT:    MMT Right eval Left eval  Hip flexion 4- 4  Hip extension 4- 4-  Hip abduction 3+ 4-  Hip adduction    Hip internal rotation    Hip external rotation    Knee flexion    Knee extension    Ankle dorsiflexion    Ankle plantarflexion    Ankle inversion    Ankle eversion     (Blank rows = not tested)  LUMBAR SPECIAL TESTS:  Straight leg raise test: Negative and Slump test: Negative  FUNCTIONAL TESTS:  5 times sit to stand: 13.33 sec R LB pain   GAIT: Distance walked: 40 feet Assistive device utilized: None Level of assistance: Complete Independence Comments: antalgic gait; trunk flexed forward; decreased weight bearing phase R LE in stance   [facility] Adult PT Treatment:                                                DATE: 10/17/2023 Therapeutic Exercise: Discussed/demonstrated how to perform hooklying self-lumbar traction using strap for home program Manual Therapy: L sidelying: Soft tissue mobilization to the muscles of the R posterior/posterolaterap hip Soft tissue mobilization to the R quadratus lumborum/lumbar paraspinals Caudal glides to the R innominate (hip drop) Cephalad glides to the R lower rib cage Pelvis/sacral rotational glides Hooklying: Long axis posterolateral hip glides, L side, working into greater amounts of adduction Manual lumbar traction using mobilization strap  [facility] Adult PT Treatment:                                                DATE: 10/10/2023 Therapeutic Exercise: Supine Manual stretching of the R hip into flexion + adduction and flexion + abduction + external rotation to stretch the posterior/posterolateral  hip Manual Therapy: L sidelying: Soft tissue mobilization to the muscles of the R posterior/posterolaterap hip Soft tissue mobilization to the R quadratus lumborum/lumbar paraspinals Caudal glides to the R innominate (hip drop) Cephalad glides to the R lower rib cage  [facility] Adult PT Treatment:                                                DATE: 10/08/2023 Review of evaluation findings; POC, HEP   PATIENT EDUCATION:  Education details: POC; HEP  Person educated: Patient Education method: Explanation, Demonstration, Tactile cues, Verbal cues, and Handouts Education comprehension: verbalized understanding, returned demonstration, verbal cues required, tactile cues required, and needs further education  HOME EXERCISE PROGRAM: Access Code: [email or web address] URL: [email or web address] Date: 10/08/2023 Prepared by: Celyn Holt  Exercises - Standing Lumbar Extension  - 2 x daily - 7 x weekly -  1 sets - 2-3 reps - 2-3 sec  hold  Patient Education - Hospital doctor - Office Posture  ASSESSMENT:  CLINICAL IMPRESSION: Excellent response to last session with patient reporting a notable reduction in symptoms and improvement in function. He presented today with a flare up, potentially related to the house work he did yesterday. Continued with similar interventions today - patient still had pain with standing at end of session but noted increased comfort with walking. Instructed a self-lumbar traction strategy for home use. Physical therapy remains indicated.   Evaluation: Patient is a 78 y.o. male who was seen today for physical therapy evaluation and treatment for radicular LBP. He has a history of chronic, recurrent LBP with R LE radicular symptoms. Recent flare up of symptoms 08/22/23 initially responded to medication and rest with partial resolution until patient re-injured back lifting and twisting when moving a box 09/27/23. He has more sever pain with pain radiating into the R  posterior hip/buttock and at times into the R LE. He demonstrated centralization of symptoms with extension through lumbar spine and sitting with 3 inch foam roll along spine vertically. He has poor lumbar and LE ROM/mobility; significant core weakness; LE weakness; poor body mechanics; abnormal posture; limited functional activities and ADL's due to pain. Patient will benefit from PT to address deficits identified.    OBJECTIVE IMPAIRMENTS: Abnormal gait, decreased activity tolerance, decreased balance, decreased mobility, decreased ROM, decreased strength, impaired flexibility, improper body mechanics, postural dysfunction, and pain.   ACTIVITY LIMITATIONS: carrying, lifting, bending, sitting, standing, squatting, sleeping, transfers, bed mobility, and dressing  PARTICIPATION LIMITATIONS: meal prep, cleaning, laundry, driving, and shopping  PERSONAL FACTORS: Behavior pattern, Fitness, Past/current experiences, Profession, Time since onset of injury/illness/exacerbation, and  comorbidities: chronic LBP; multiple abdominal surgeries; LE surgeries  are also affecting patient's functional outcome.   REHAB POTENTIAL: Good  CLINICAL DECISION MAKING: Evolving/moderate complexity  EVALUATION COMPLEXITY: Moderate   GOALS: Goals reviewed with patient? Yes  SHORT TERM GOALS: Target date: 11/05/2023   Independent in initial HEP Baseline: Goal status: INITIAL  2.  Increase trunk ROM/mobility to WFL's for functional activities  Baseline:  Goal status: INITIAL  3.  Independent in transfers and transitional movements with proper body mechanics  Baseline:  Goal status: INITIAL   LONG TERM GOALS: Target date: 12/03/2023   Decrease LBP and R LE pain by 75% allowing patient to return to normal activities  Baseline:  Goal status: INITIAL  2.  Increase strength bilat LE's to 4+/5  Baseline:  Goal status: INITIAL  3.  Patient demonstrates/reports ability to walk for 20-30 minutes without  increased pain/LE symptoms  Baseline:  Goal status: INITIAL  4.  Independent in HEP including aquatic program as indicated  Baseline:  Goal status: INITIAL  5.  Improve Modified Owestry score by 10-15 points   Baseline: 34/50; 68%  Goal status: INITIAL   PLAN:  PT FREQUENCY: 2x/week  PT DURATION: 8 weeks  PLANNED INTERVENTIONS: 97110-Therapeutic exercises, 97530- Therapeutic activity, 97112- Neuromuscular re-education, 97535- Self Care, 40347- Manual therapy, [phone]- Aquatic Therapy, Patient/Family education, Dry Needling, Joint mobilization, Cryotherapy, and Moist heat.  PLAN FOR NEXT SESSION: review and progress with exercises; continue spine care and body mechanics education; manual work and modalities as indicated    Zoe Hinds, PT 10/17/2023, 1:57 PM

## 2023-10-22 ENCOUNTER — Ambulatory Visit: Admitting: Rehabilitative and Restorative Service Providers"

## 2023-10-22 ENCOUNTER — Other Ambulatory Visit: Payer: Self-pay | Admitting: Nurse Practitioner

## 2023-10-22 ENCOUNTER — Encounter: Payer: Self-pay | Admitting: Rehabilitative and Restorative Service Providers"

## 2023-10-22 DIAGNOSIS — M6281 Muscle weakness (generalized): Secondary | ICD-10-CM

## 2023-10-22 DIAGNOSIS — R293 Abnormal posture: Secondary | ICD-10-CM

## 2023-10-22 DIAGNOSIS — M5441 Lumbago with sciatica, right side: Secondary | ICD-10-CM | POA: Diagnosis not present

## 2023-10-22 DIAGNOSIS — R29898 Other symptoms and signs involving the musculoskeletal system: Secondary | ICD-10-CM

## 2023-10-22 DIAGNOSIS — R252 Cramp and spasm: Secondary | ICD-10-CM

## 2023-10-22 DIAGNOSIS — M5410 Radiculopathy, site unspecified: Secondary | ICD-10-CM

## 2023-10-22 NOTE — Therapy (Signed)
 OUTPATIENT PHYSICAL THERAPY THORACOLUMBAR TREATMENT   Patient Name: Alex Boyle MRN: [id number] DOB:[birth year], 78 y.o., male Today's Date: 10/22/2023  END OF SESSION:  PT End of Session - 10/22/23 1411     Visit Number 4    Number of Visits 16    Date for PT Re-Evaluation 12/03/23    Authorization Type BCBS Federal primary; medicare secondary    Progress Note Due on Visit 10    PT Start Time 1405    PT Stop Time 1445    PT Time Calculation (min) 40 min    Activity Tolerance Patient tolerated treatment well              Past Medical History:  Diagnosis Date   Allergy    Arthritis    Asthma    COPD (chronic obstructive pulmonary disease) (HCC)    boderline per pt   Diverticulitis    Hyperlipidemia    Hypertension    Myocardial infarction (HCC)    Sleep apnea    uses bipap   Sleep apnea in adult    Past Surgical History:  Procedure Laterality Date   COLON SURGERY     COLONOSCOPY     CORONARY STENT PLACEMENT     x2   FOOT SURGERY     JOINT REPLACEMENT     knee   LAPAROSCOPIC TRANSABDOMINAL HERNIA     MANDIBLE SURGERY     skin cancer     basil cell removed on back   UPPER GASTROINTESTINAL ENDOSCOPY     Patient Active Problem List   Diagnosis Date Noted   Radicular low back pain 10/01/2023   Bronchitis 06/13/2021   Healthcare maintenance 11/14/2020   History of colon polyps 11/14/2020   Barrett's esophagus with dysplasia 11/14/2020   Neuropathy 11/14/2020   Asthma exacerbation 04/22/2019   Lumbar strain 04/22/2019   Venous insufficiency 01/08/2019   Numbness and tingling of both feet 01/08/2019   History of basal cell cancer 01/08/2019   Hyperlipidemia LDL goal <70 10/13/2018   Essential hypertension 09/23/2018   CAD (coronary artery disease) 09/23/2018   Asthma 09/23/2018   OSA (obstructive sleep apnea) 09/23/2018   Benign prostatic hyperplasia with lower urinary tract symptoms 09/23/2018    PCP: Dr Trecia Friends  REFERRING  PROVIDER: Odette Benjamin, NP  REFERRING DIAG: Radicular LBP  Rationale for Evaluation and Treatment: Rehabilitation  THERAPY DIAG:  Acute right-sided low back pain with right-sided sciatica  Other symptoms and signs involving the musculoskeletal system  Muscle weakness (generalized)  Abnormal posture  Cramp and spasm  ONSET DATE: 08/22/23; 09/27/23  SUBJECTIVE:  SUBJECTIVE STATEMENT: 10/22/2023: The patient reports that he had good response to last treatment and felt better all weekend. He awoke this morning and has sever spasms and pain in R LB when he rotated to sit up to get out of the bed. Pain this morning was a 9/10. Now 4/10. Used all his muscle relaxer last week. May ask MD for prescription refill. Has made modifications for computer and desk work space at home.     Evaluation: History of chronic LBP increased recently and treated with prednisone  and tramadol  with improvement. Re-injured ~2-3 weeks ago with pain becoming more severe. Initially injured LB 08/22/23 lifting something in his yard. Doing better with meds and rest until re-injury 09/27/23 when twisting with box at the food bank. Pain is the "worst it's ever been" and has not subsided with meds or usual exercises. He has tried hot baths, heating pad and meds with no change. Pain is worse on the R LB than L. He has pain in the R buttock and posterolateral hip as well as into to the R shoulder and neck.   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 Yes: NPRS  scale: 4-5/10  Pain location: R > L LB; R posterior hip to lateral hip; pain into the R shoulder and neck  Pain description: aching Aggravating factors: moving; turning to R; lifting R LE; lying down to sleep at night; rolling over  Relieving factors: hot bath for 20-30 min; heating pad temporary relief; walking slow pace OK   PRECAUTIONS: None   WEIGHT BEARING RESTRICTIONS: No  FALLS:  Has patient fallen in last 6 months? 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  DIAGNOSTIC FINDINGS:  None for lumbar spine   PATIENT SURVEYS:  Modified Oswestry 34/50; 68%   SENSATION: WFL  MUSCLE LENGTH: Hamstrings: Right 65 deg; Left 70 deg Thomas test: tight right > left  POSTURE: rounded shoulders, forward head, decreased lumbar lordosis, increased thoracic kyphosis, and flexed trunk   PALPATION: Pain with PA mobs lower lumbar spine; tightness R > L lumbar musculature; posterior hip/buttock  LUMBAR ROM:   AROM eval  Flexion 20% using hands on thighs up/down pain R LB  Extension 20% no pain   Right lateral flexion 50% no pain  Left lateral flexion 45% pain R LB  Right rotation 20%   Left rotation 20%    (Blank rows = not tested)  LOWER EXTREMITY ROM:  limited mobility R > L throughout hip especially in rotation  Active  Right eval Left eval  Hip flexion    Hip extension  Hip abduction    Hip adduction    Hip internal rotation    Hip external rotation    Knee flexion    Knee extension    Ankle dorsiflexion    Ankle plantarflexion    Ankle inversion    Ankle eversion     (Blank rows = not tested)  LOWER EXTREMITY MMT:    MMT Right eval Left eval  Hip flexion 4- 4  Hip extension 4- 4-  Hip abduction 3+ 4-  Hip adduction    Hip internal rotation    Hip external rotation    Knee flexion    Knee extension    Ankle dorsiflexion    Ankle plantarflexion    Ankle inversion    Ankle eversion     (Blank rows = not tested)  LUMBAR SPECIAL TESTS:  Straight leg raise test: Negative and Slump test: Negative  FUNCTIONAL TESTS:  5 times sit to stand: 13.33 sec R LB pain   GAIT: Distance walked: 40 feet Assistive device utilized: None Level of assistance: Complete Independence Comments: antalgic gait; trunk flexed forward; decreased weight bearing phase R LE in stance   [facility] Adult PT Treatment:                                                DATE: 10/22/2023 Self care: Discussed and practiced sit to supine and supine to sit through sidelying position, avoiding twisting and/or sitting straight up from supine  Discussed use of noodle and coregeous ball for sitting posture  Manual Therapy: L sidelying: Soft tissue mobilization to the muscles of the R posterior/posterolaterap hip Soft tissue mobilization to the R quadratus lumborum/lumbar paraspinals Caudal glides to the R innominate (hip drop) Cephalad glides to the R lower rib cage Pelvis/sacral rotational glides Hooklying: Hip distraction R using towel 20-30 sec hold x 5 reps  Therapeutic activity:   Thoracic extension in sitting using coregeous ball T-spine  Therapeutic exercise:   Gentle knee to chest 10 sec hold x 5  Happy baby with patient clasping knees; gentle rock side to side   Seated chin tuck placing fingers on chin for positional awareness and gentle overpressure - coregeous ball  at T-spine    [facility] Adult PT Treatment:                                                DATE: 10/17/2023 Therapeutic Exercise: Discussed/demonstrated how to perform hooklying self-lumbar traction using strap for home program Manual Therapy: L sidelying: Soft tissue mobilization to the muscles of the R posterior/posterolaterap hip Soft tissue mobilization to the R quadratus lumborum/lumbar paraspinals Caudal glides to the R innominate (hip drop) Cephalad glides to the R lower rib cage Pelvis/sacral rotational glides Hooklying: Long axis posterolateral hip glides, L side, working into greater amounts of adduction Manual lumbar traction using mobilization strap     [facility] Adult PT Treatment:                                                DATE: 10/10/2023 Therapeutic Exercise: Supine Manual stretching of the R  hip into flexion + adduction and flexion + abduction + external rotation to stretch the posterior/posterolateral hip Manual Therapy: L sidelying: Soft tissue mobilization to the muscles of the R posterior/posterolaterap hip Soft tissue mobilization to the R quadratus lumborum/lumbar paraspinals Caudal glides to the R innominate (hip drop) Cephalad glides to the R lower rib cage  [facility] Adult PT Treatment:                                                DATE: 10/08/2023 Review of evaluation findings; POC, HEP   PATIENT EDUCATION:  Education details: POC; HEP  Person educated: Patient Education method: Explanation, Demonstration, Tactile cues, Verbal cues, and Handouts Education comprehension: verbalized understanding, returned demonstration, verbal cues required, tactile cues required, and needs further education  HOME EXERCISE PROGRAM: Access Code: [email or web address] URL: [email or web address] Date: 10/22/2023 Prepared by: Delise Simenson  Exercises - Standing Lumbar Extension  - 2 x daily - 7 x weekly - 1 sets - 2-3 reps - 2-3 sec  hold - Supine Double Knee to Chest  - 2 x  daily - 7 x weekly - 1 sets - 3-5 reps - 10 sec  hold - Supine Pelvic Floor Stretch - Hands on Knees  - 2 x daily - 7 x weekly - 1 sets - 3-5 reps - 10 sec  hold - Seated Cervical Retraction  - 2 x daily - 7 x weekly - 1-2 sets - 5-10 reps - 10 sec  hold  Patient Education - Hospital doctor - Office Posture  ASSESSMENT:  CLINICAL IMPRESSION: Patient reports excellent response to manual treatment with previous treatments with good reduction in symptoms and improvement in function. He reports a flare up of symptoms when he moved to get out of bed this morning. Continued with manual work and added gentle exercises that patient reported good results with in the past. Education re-back care and body mechanics with transitional movements and sitting posture and alignment.   Evaluation: Patient is a 78 y.o. male who was seen today for physical therapy evaluation and treatment for radicular LBP. He has a history of chronic, recurrent LBP with R LE radicular symptoms. Recent flare up of symptoms 08/22/23 initially responded to medication and rest with partial resolution until patient re-injured back lifting and twisting when moving a box 09/27/23. He has more sever pain with pain radiating into the R posterior hip/buttock and at times into the R LE. He demonstrated centralization of symptoms with extension through lumbar spine and sitting with 3 inch foam roll along spine vertically. He has poor lumbar and LE ROM/mobility; significant core weakness; LE weakness; poor body mechanics; abnormal posture; limited functional activities and ADL's due to pain. Patient will benefit from PT to address deficits identified.    OBJECTIVE IMPAIRMENTS: Abnormal gait, decreased activity tolerance, decreased balance, decreased mobility, decreased ROM, decreased strength, impaired flexibility, improper body mechanics, postural dysfunction, and pain.    GOALS: Goals reviewed with patient? Yes  SHORT TERM GOALS:  Target date: 11/05/2023   Independent in initial HEP Baseline: Goal status: INITIAL  2.  Increase trunk ROM/mobility to WFL's for functional activities  Baseline:  Goal status: INITIAL  3.  Independent in transfers and transitional movements with proper body mechanics  Baseline:  Goal status: INITIAL   LONG TERM GOALS: Target date: 12/03/2023   Decrease LBP and R  LE pain by 75% allowing patient to return to normal activities  Baseline:  Goal status: INITIAL  2.  Increase strength bilat LE's to 4+/5  Baseline:  Goal status: INITIAL  3.  Patient demonstrates/reports ability to walk for 20-30 minutes without increased pain/LE symptoms  Baseline:  Goal status: INITIAL  4.  Independent in HEP including aquatic program as indicated  Baseline:  Goal status: INITIAL  5.  Improve Modified Owestry score by 10-15 points   Baseline: 34/50; 68%  Goal status: INITIAL   PLAN:  PT FREQUENCY: 2x/week  PT DURATION: 8 weeks  PLANNED INTERVENTIONS: 97110-Therapeutic exercises, 97530- Therapeutic activity, 97112- Neuromuscular re-education, 97535- Self Care, [id number]- Manual therapy, [id number]- Aquatic Therapy, Patient/Family education, Dry Needling, Joint mobilization, Cryotherapy, and Moist heat.  PLAN FOR NEXT SESSION: review and progress with exercises; continue spine care and body mechanics education; manual work and modalities as indicated    Eryn Krejci P Laker Thompson, PT 10/22/2023, 2:11 PM

## 2023-10-23 NOTE — Telephone Encounter (Signed)
 Requesting: CYCLOBENZAPRINE  10 MG TAB  Last Visit: 10/01/2023 Next Visit: 11/12/2023 Last Refill: 10/01/2023  Please Advise

## 2023-10-24 ENCOUNTER — Ambulatory Visit: Admitting: Rehabilitative and Restorative Service Providers"

## 2023-10-24 ENCOUNTER — Encounter: Payer: Self-pay | Admitting: Rehabilitative and Restorative Service Providers"

## 2023-10-24 DIAGNOSIS — M5441 Lumbago with sciatica, right side: Secondary | ICD-10-CM

## 2023-10-24 DIAGNOSIS — R252 Cramp and spasm: Secondary | ICD-10-CM

## 2023-10-24 DIAGNOSIS — R293 Abnormal posture: Secondary | ICD-10-CM

## 2023-10-24 DIAGNOSIS — M6281 Muscle weakness (generalized): Secondary | ICD-10-CM

## 2023-10-24 DIAGNOSIS — R29898 Other symptoms and signs involving the musculoskeletal system: Secondary | ICD-10-CM

## 2023-10-24 NOTE — Therapy (Signed)
 OUTPATIENT PHYSICAL THERAPY THORACOLUMBAR TREATMENT   Patient Name: Alex Boyle MRN: [id number] DOB:1946-01-05, 78 y.o., male Today's Date: 10/24/2023  END OF SESSION:  PT End of Session - 10/24/23 1407     Visit Number 5    Number of Visits 16    Date for PT Re-Evaluation 12/03/23    Authorization Type BCBS Federal primary; medicare secondary    Progress Note Due on Visit 10    PT Start Time 1405    PT Stop Time 1445    PT Time Calculation (min) 40 min    Activity Tolerance Patient tolerated treatment well              Past Medical History:  Diagnosis Date   Allergy    Arthritis    Asthma    COPD (chronic obstructive pulmonary disease) (HCC)    boderline per pt   Diverticulitis    Hyperlipidemia    Hypertension    Myocardial infarction (HCC)    Sleep apnea    uses bipap   Sleep apnea in adult    Past Surgical History:  Procedure Laterality Date   COLON SURGERY     COLONOSCOPY     CORONARY STENT PLACEMENT     x2   FOOT SURGERY     JOINT REPLACEMENT     knee   LAPAROSCOPIC TRANSABDOMINAL HERNIA     MANDIBLE SURGERY     skin cancer     basil cell removed on back   UPPER GASTROINTESTINAL ENDOSCOPY     Patient Active Problem List   Diagnosis Date Noted   Radicular low back pain 10/01/2023   Bronchitis 06/13/2021   Healthcare maintenance 11/14/2020   History of colon polyps 11/14/2020   Barrett's esophagus with dysplasia 11/14/2020   Neuropathy 11/14/2020   Asthma exacerbation 04/22/2019   Lumbar strain 04/22/2019   Venous insufficiency 01/08/2019   Numbness and tingling of both feet 01/08/2019   History of basal cell cancer 01/08/2019   Hyperlipidemia LDL goal <70 10/13/2018   Essential hypertension 09/23/2018   CAD (coronary artery disease) 09/23/2018   Asthma 09/23/2018   OSA (obstructive sleep apnea) 09/23/2018   Benign prostatic hyperplasia with lower urinary tract symptoms 09/23/2018    PCP: Dr Trecia Friends  REFERRING  PROVIDER: Odette Benjamin, NP  REFERRING DIAG: Radicular LBP  Rationale for Evaluation and Treatment: Rehabilitation  THERAPY DIAG:  Acute right-sided low back pain with right-sided sciatica  Other symptoms and signs involving the musculoskeletal system  Muscle weakness (generalized)  Abnormal posture  Cramp and spasm  ONSET DATE: 08/22/23; 09/27/23  SUBJECTIVE:  SUBJECTIVE STATEMENT: 10/24/2023: The patient reports that he felt good after treatment and had less pain. He has been sitting more upright using noodle. He did lie on the L side that night and got up easily. He did not have the 'lump" the the R LB - it was gone. He felt back tightening up yesterday but did not have pain. He got up this morning fine but noted onset of muscle spasm when he made a "bad turn" while making coffee. He has trouble when he was doing dishes even when is tries to squat to put dishes in the dishwasher. Feels he has kidney stones which are always in the R kidney. He has an appointment with a urologist Monday. He thinks he has prostate cancer.    Evaluation: History of chronic LBP increased recently and treated with prednisone  and tramadol  with improvement. Re-injured ~2-3 weeks ago with pain becoming more severe. Initially injured LB 08/22/23 lifting something in his yard. Doing better with meds and rest until re-injury 09/27/23 when twisting with box at the food bank. Pain is the "worst it's ever been" and has not subsided with meds or usual exercises. He has tried hot baths, heating pad and meds with no change. Pain is worse on the R LB than L. He has pain in the R buttock and posterolateral hip as well as into to the R shoulder and neck.   PERTINENT HISTORY:  Chronic LBP for HS football injury; L TKA; R foot pain x ~ 2-3  years; 5 surgeries on R ankle; 06/10/20 one surgery R foot with fusion of toes; CAD; MI's 12/12 and 4/13 with stent placement; 58% heart ejection fracture; ~ 9 abdominal surgeries last abdominal surgery 2008; GERD  PAIN:  Are you having pain? Yes: NPRS scale: 5-6/10  Pain location: R > L LB; R posterior hip to lateral hip; pain into the R shoulder and neck  Pain description: aching Aggravating factors: moving; turning to R; lifting R LE; lying down to sleep at night; rolling over  Relieving factors: hot bath for 20-30 min; heating pad temporary relief; walking slow pace OK   PRECAUTIONS: None   WEIGHT BEARING RESTRICTIONS: No  FALLS:  Has patient fallen in last 6 months? 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  DIAGNOSTIC FINDINGS:  None for lumbar spine   PATIENT SURVEYS:  Modified Oswestry 34/50; 68%   SENSATION: WFL  MUSCLE LENGTH: Hamstrings: Right 65 deg; Left 70 deg Thomas test: tight right > left  POSTURE: rounded shoulders, forward head, decreased lumbar lordosis, increased thoracic kyphosis, and flexed trunk   PALPATION: Pain with PA mobs lower lumbar spine; tightness R > L lumbar musculature; posterior hip/buttock  LUMBAR ROM:   AROM eval  Flexion 20% using hands on thighs up/down pain R LB  Extension 20% no pain   Right lateral flexion 50% no pain  Left  lateral flexion 45%  pain R LB  Right rotation 20%   Left rotation 20%    (Blank rows = not tested)  LOWER EXTREMITY ROM:  limited mobility R > L throughout hip especially in rotation  Active  Right eval Left eval  Hip flexion    Hip extension    Hip abduction    Hip adduction    Hip internal rotation    Hip external rotation    Knee flexion    Knee extension    Ankle dorsiflexion    Ankle plantarflexion    Ankle inversion    Ankle eversion     (Blank rows = not tested)  LOWER EXTREMITY MMT:    MMT Right eval Left eval  Hip flexion 4- 4  Hip extension 4- 4-  Hip abduction 3+ 4-  Hip adduction    Hip internal rotation    Hip external rotation    Knee flexion    Knee extension    Ankle dorsiflexion    Ankle plantarflexion    Ankle inversion    Ankle eversion     (Blank rows = not tested)  LUMBAR SPECIAL TESTS:  Straight leg raise test: Negative and Slump test: Negative  FUNCTIONAL TESTS:  5 times sit to stand: 13.33 sec R LB pain   GAIT: Distance walked: 40 feet Assistive device utilized: None Level of assistance: Complete Independence Comments: antalgic gait; trunk flexed forward; decreased weight bearing phase R LE in stance   [facility] Adult PT Treatment:                                                DATE: 10/24/2023 Self care: Reviewed and practiced sit to supine and supine to sit through sidelying position, avoiding twisting and/or sitting straight up from supine  Reviewed use of noodle and coregeous ball for sitting posture  Manual Therapy: L sidelying: Soft tissue mobilization to the muscles of the R posterior/posterolaterap hip Soft tissue mobilization to the R quadratus lumborum/lumbar paraspinals Caudal glides to the R innominate (hip drop) Cephalad glides to the R lower rib cage Pelvis/sacral rotational glides Sitting: Lateral trunk flexion to L using pillows under L side, lowering and pushing back up to sitting  10-15 sec x 3 reps  Therapeutic  activity:  Sidelying  Sidelying hips and knees flexed pillow between knees hip protraction pressing knee forward gently x 5-8 Sitting: Lateral trunk flexion to L using pillows under L side, lowering and pushing back up to sitting  10-15 sec x 3 reps Thoracic extension in sitting using coregeous ball T-spine     [facility] Adult PT Treatment:                                                DATE: 10/22/2023 Self care: Discussed and practiced sit to supine and supine to sit through sidelying position, avoiding twisting and/or sitting straight up from supine  Discussed use of noodle and coregeous ball for sitting posture  Manual Therapy: L sidelying: Soft tissue mobilization to the muscles of the R posterior/posterolaterap hip Soft tissue mobilization to the R quadratus lumborum/lumbar paraspinals Caudal glides to the R innominate (hip drop) Cephalad glides to the R lower rib cage Pelvis/sacral rotational glides Hooklying: Hip distraction  R using towel 20-30 sec hold x 5 reps  Therapeutic activity:   Thoracic extension in sitting using coregeous ball T-spine  Therapeutic exercise:   Gentle knee to chest 10 sec hold x 5  Happy baby with patient clasping knees; gentle rock side to side   Seated chin tuck placing fingers on chin for positional awareness and gentle overpressure - coregeous ball at T-spine    [facility] Adult PT Treatment:                                                DATE: 10/17/2023 Therapeutic Exercise: Discussed/demonstrated how to perform hooklying self-lumbar traction using strap for home program Manual Therapy: L sidelying: Soft tissue mobilization to the muscles of the R posterior/posterolaterap hip Soft tissue mobilization to the R quadratus lumborum/lumbar paraspinals Caudal glides to the R innominate (hip drop) Cephalad glides to the R lower rib cage Pelvis/sacral rotational glides Hooklying: Long axis posterolateral hip glides, L side, working into greater amounts of  adduction Manual lumbar traction using mobilization strap     [facility] Adult PT Treatment:                                                DATE: 10/10/2023 Therapeutic Exercise: Supine Manual stretching of the R hip into flexion + adduction and flexion + abduction + external rotation to stretch the posterior/posterolateral hip Manual Therapy: L sidelying: Soft tissue mobilization to the muscles of the R posterior/posterolaterap hip Soft tissue mobilization to the R quadratus lumborum/lumbar paraspinals Caudal glides to the R innominate (hip drop) Cephalad glides to the R lower rib cage  [facility] Adult PT Treatment:                                                DATE: 10/08/2023 Review of evaluation findings; POC, HEP   PATIENT EDUCATION:  Education details: POC; HEP  Person educated: Patient Education method: Explanation, Demonstration, Tactile cues, Verbal cues, and Handouts Education comprehension: verbalized understanding, returned demonstration, verbal cues required, tactile cues required, and needs further education  HOME EXERCISE PROGRAM: Access Code: [email or web address] URL: [email or web address] Date: 10/22/2023 Prepared by: Maydell Knoebel  Exercises - Standing Lumbar Extension  - 2 x daily - 7 x weekly - 1 sets - 2-3 reps - 2-3 sec  hold - Supine Double Knee to Chest  - 2 x daily - 7 x weekly - 1 sets - 3-5 reps - 10 sec  hold - Supine Pelvic Floor Stretch - Hands on Knees  - 2 x daily - 7 x weekly - 1 sets - 3-5 reps - 10 sec  hold - Seated Cervical Retraction  - 2 x daily - 7 x weekly - 1-2 sets - 5-10 reps - 10 sec  hold  Patient Education - Hospital doctor - Office Posture  ASSESSMENT:  CLINICAL IMPRESSION: Patient reports continued positive response to manual treatment with treatment with good reduction in symptoms and improvement in function. Note muscular tightness and decreased mobility through the lumbar spine. Some improvement with manual work. He can  stand erect following treatment.  He can still feel some know in the R lumbar area. He reports a flare up of symptoms when he turned to make coffee this morning. Continued with manual work. Trial of gentle L side bending in sitting using L UE to lower and push up from stretch. Patient reports prior episodes of R LBP when he has been passing kidney stones. He will see urologist Monday. We will proceed with treatment pending MD evaluation to determine if some of R LBP is due to kidney pathology.   Evaluation: Patient is a 78 y.o. male who was seen today for physical therapy evaluation and treatment for radicular LBP. He has a history of chronic, recurrent LBP with R LE radicular symptoms. Recent flare up of symptoms 08/22/23 initially responded to medication and rest with partial resolution until patient re-injured back lifting and twisting when moving a box 09/27/23. He has more sever pain with pain radiating into the R posterior hip/buttock and at times into the R LE. He demonstrated centralization of symptoms with extension through lumbar spine and sitting with 3 inch foam roll along spine vertically. He has poor lumbar and LE ROM/mobility; significant core weakness; LE weakness; poor body mechanics; abnormal posture; limited functional activities and ADL's due to pain. Patient will benefit from PT to address deficits identified.    OBJECTIVE IMPAIRMENTS: Abnormal gait, decreased activity tolerance, decreased balance, decreased mobility, decreased ROM, decreased strength, impaired flexibility, improper body mechanics, postural dysfunction, and pain.    GOALS: Goals reviewed with patient? Yes  SHORT TERM GOALS: Target date: 11/05/2023   Independent in initial HEP Baseline: Goal status: INITIAL  2.  Increase trunk ROM/mobility to WFL's for functional activities  Baseline:  Goal status: INITIAL  3.  Independent in transfers and transitional movements with proper body mechanics  Baseline:  Goal  status: INITIAL   LONG TERM GOALS: Target date: 12/03/2023   Decrease LBP and R LE pain by 75% allowing patient to return to normal activities  Baseline:  Goal status: INITIAL  2.  Increase strength bilat LE's to 4+/5  Baseline:  Goal status: INITIAL  3.  Patient demonstrates/reports ability to walk for 20-30 minutes without increased pain/LE symptoms  Baseline:  Goal status: INITIAL  4.  Independent in HEP including aquatic program as indicated  Baseline:  Goal status: INITIAL  5.  Improve Modified Owestry score by 10-15 points   Baseline: 34/50; 68%  Goal status: INITIAL   PLAN:  PT FREQUENCY: 2x/week  PT DURATION: 8 weeks  PLANNED INTERVENTIONS: 97110-Therapeutic exercises, 97530- Therapeutic activity, 97112- Neuromuscular re-education, 97535- Self Care, [id number]- Manual therapy, [phone]- Aquatic Therapy, Patient/Family education, Dry Needling, Joint mobilization, Cryotherapy, and Moist heat.  PLAN FOR NEXT SESSION: review and progress with exercises; continue spine care and body mechanics education; manual work and modalities as indicated    Arnette Driggs P Genette Huertas, PT 10/24/2023, 2:08 PM

## 2023-10-29 ENCOUNTER — Ambulatory Visit: Admitting: Rehabilitative and Restorative Service Providers"

## 2023-10-29 ENCOUNTER — Encounter: Payer: Self-pay | Admitting: Rehabilitative and Restorative Service Providers"

## 2023-10-29 DIAGNOSIS — M6281 Muscle weakness (generalized): Secondary | ICD-10-CM

## 2023-10-29 DIAGNOSIS — R293 Abnormal posture: Secondary | ICD-10-CM

## 2023-10-29 DIAGNOSIS — R252 Cramp and spasm: Secondary | ICD-10-CM

## 2023-10-29 DIAGNOSIS — M5441 Lumbago with sciatica, right side: Secondary | ICD-10-CM | POA: Diagnosis not present

## 2023-10-29 DIAGNOSIS — R29898 Other symptoms and signs involving the musculoskeletal system: Secondary | ICD-10-CM

## 2023-10-29 NOTE — Therapy (Signed)
 OUTPATIENT PHYSICAL THERAPY THORACOLUMBAR TREATMENT   Patient Name: Alex Boyle MRN: [id number] DOB:[birth year], 78 y.o., male Today's Date: 10/29/2023  END OF SESSION:  PT End of Session - 10/29/23 1403     Visit Number 6    Number of Visits 16    Date for PT Re-Evaluation 12/03/23    Authorization Type BCBS Federal primary; medicare secondary    Progress Note Due on Visit 10    PT Start Time 1400    PT Stop Time 1445    PT Time Calculation (min) 45 min              Past Medical History:  Diagnosis Date   Allergy    Arthritis    Asthma    COPD (chronic obstructive pulmonary disease) (HCC)    boderline per pt   Diverticulitis    Hyperlipidemia    Hypertension    Myocardial infarction (HCC)    Sleep apnea    uses bipap   Sleep apnea in adult    Past Surgical History:  Procedure Laterality Date   COLON SURGERY     COLONOSCOPY     CORONARY STENT PLACEMENT     x2   FOOT SURGERY     JOINT REPLACEMENT     knee   LAPAROSCOPIC TRANSABDOMINAL HERNIA     MANDIBLE SURGERY     skin cancer     basil cell removed on back   UPPER GASTROINTESTINAL ENDOSCOPY     Patient Active Problem List   Diagnosis Date Noted   Radicular low back pain 10/01/2023   Bronchitis 06/13/2021   Healthcare maintenance 11/14/2020   History of colon polyps 11/14/2020   Barrett's esophagus with dysplasia 11/14/2020   Neuropathy 11/14/2020   Asthma exacerbation 04/22/2019   Lumbar strain 04/22/2019   Venous insufficiency 01/08/2019   Numbness and tingling of both feet 01/08/2019   History of basal cell cancer 01/08/2019   Hyperlipidemia LDL goal <70 10/13/2018   Essential hypertension 09/23/2018   CAD (coronary artery disease) 09/23/2018   Asthma 09/23/2018   OSA (obstructive sleep apnea) 09/23/2018   Benign prostatic hyperplasia with lower urinary tract symptoms 09/23/2018    PCP: Dr Trecia Friends  REFERRING PROVIDER: Odette Benjamin, NP  REFERRING DIAG:  Radicular LBP  Rationale for Evaluation and Treatment: Rehabilitation  THERAPY DIAG:  Acute right-sided low back pain with right-sided sciatica  Other symptoms and signs involving the musculoskeletal system  Muscle weakness (generalized)  Abnormal posture  Cramp and spasm  ONSET DATE: 08/22/23; 09/27/23  SUBJECTIVE:  SUBJECTIVE STATEMENT: 10/29/2023: Patient was seen by urologist yesterday. Patient and MD agree that the deep tissue work may have created increased pain from the kidney stones but both feel that the therapy is important and he can continue with deep tissue work for LBP. MD has planned procedure to remove larger stones 11/25. Patient reports that the therapy is helping. He can tell he has less back pain. He is sleeping 9-10 hours without pain.He can now lie down and get up without pain - both morning and evening.  He has gradual increased pain with functional activities during the day. He will stop and stretch which helps decrease the pain. Alex Boyle is working on his exercises at home. Some of the movements he has made up himself to stretch.    Evaluation: History of chronic LBP increased recently and treated with prednisone  and tramadol  with improvement. Re-injured ~2-3 weeks ago with pain becoming more severe. Initially injured LB 08/22/23 lifting something in his yard. Doing better with meds and rest until re-injury 09/27/23 when twisting with box at the food bank. Pain is the "worst it's ever been" and has not subsided with meds or usual exercises. He has tried hot baths, heating pad and meds with no change. Pain is worse on the R LB than L. He has pain in the R buttock and posterolateral hip as well as into to the R shoulder and neck.   PERTINENT HISTORY:  Chronic LBP for HS football injury; L TKA;  R foot pain x ~ 2-3 years; 5 surgeries on R ankle; 06/10/20 one surgery R foot with fusion of toes; CAD; MI's 12/12 and 4/13 with stent placement; 58% heart ejection fracture; ~ 9 abdominal surgeries last abdominal surgery 2008; GERD  PAIN:  Are you having pain? Yes: NPRS scale: 1-2/10  Pain location: R > L LB; R posterior hip to lateral hip; pain into the R shoulder and neck  Pain description: aching Aggravating factors: moving; turning to R; lifting R LE; lying down to sleep at night; rolling over  Relieving factors: hot bath for 20-30 min; heating pad temporary relief; walking slow pace OK   PRECAUTIONS: None   WEIGHT BEARING RESTRICTIONS: No  FALLS:  Has patient fallen in last 6 months? 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  DIAGNOSTIC FINDINGS:  None for lumbar spine   PATIENT SURVEYS:  Modified Oswestry 34/50; 68%   SENSATION: WFL  MUSCLE LENGTH: Hamstrings: Right 65 deg; Left 70 deg Thomas test: tight right > left  POSTURE: rounded shoulders, forward head, decreased lumbar lordosis, increased thoracic kyphosis, and flexed trunk   PALPATION: Pain with PA mobs lower lumbar spine; tightness R > L lumbar musculature; posterior hip/buttock  LUMBAR ROM:   AROM eval  Flexion 20% using hands on thighs up/down pain R LB  Extension 20% no pain   Right lateral flexion 50%  no pain  Left lateral  flexion 45% pain R LB  Right rotation 20%   Left rotation 20%    (Blank rows = not tested)  LOWER EXTREMITY ROM:  limited mobility R > L throughout hip especially in rotation  Active  Right eval Left eval  Hip flexion    Hip extension    Hip abduction    Hip adduction    Hip internal rotation    Hip external rotation    Knee flexion    Knee extension    Ankle dorsiflexion    Ankle plantarflexion    Ankle inversion    Ankle eversion     (Blank rows = not tested)  LOWER EXTREMITY MMT:    MMT Right eval Left eval  Hip flexion 4- 4  Hip extension 4- 4-  Hip abduction 3+ 4-  Hip adduction    Hip internal rotation    Hip external rotation    Knee flexion    Knee extension    Ankle dorsiflexion    Ankle plantarflexion    Ankle inversion    Ankle eversion     (Blank rows = not tested)  LUMBAR SPECIAL TESTS:  Straight leg raise test: Negative and Slump test: Negative  FUNCTIONAL TESTS:  5 times sit to stand: 13.33 sec R LB pain   GAIT: Distance walked: 40 feet Assistive device utilized: None Level of assistance: Complete Independence Comments: antalgic gait; trunk flexed forward; decreased weight bearing phase R LE in stance   [facility] Adult PT Treatment:                                                DATE: 10/29/2023 Self care: Working on sit to supine and supine to sit through sidelying position, avoiding twisting and/or sitting straight up from supine  Using noodle and coregeous ball for sitting posture  Manual Therapy: L sidelying: Soft tissue mobilization to the muscles of the R posterior/posterolaterap hip Soft tissue mobilization to the R quadratus lumborum/lumbar paraspinals Caudal glides to the R innominate (hip drop) Cephalad glides to the R lower rib cage Pelvis/sacral rotational glides Therapeutic activity:  Standing  Rolling over large red therapeutic ball with ball on table to provide gentle stretch through the lumbar spine ~  30-45 sec x 3  Row green TB 3 sec x 10 x 2  Shoulder extension green TB 3 sec x 10 x 2  Sidelying  Sidelying hips and knees flexed pillow between knees hip protraction pressing knee forward gently x 5-8 Sitting: Lateral trunk flexion to L using pillows under L side, lowering and pushing back up to sitting  10-15 sec x 3 reps Thoracic extension in sitting using coregeous ball T-spine     [facility] Adult PT Treatment:                                                DATE: 10/24/2023 Self care: Reviewed and practiced sit to supine and supine to sit through sidelying position, avoiding twisting and/or sitting straight up from supine  Reviewed use of noodle and coregeous ball for sitting posture  Manual Therapy: L sidelying: Soft tissue mobilization to the muscles of the R posterior/posterolaterap hip Soft tissue mobilization to the R quadratus lumborum/lumbar paraspinals Caudal  glides to the R innominate (hip drop) Cephalad glides to the R lower rib cage Pelvis/sacral rotational glides Sitting: Lateral trunk flexion to L using pillows under L side, lowering and pushing back up to sitting  10-15 sec x 3 reps  Therapeutic activity:  Sidelying  Sidelying hips and knees flexed pillow between knees hip protraction pressing knee forward gently x 5-8 Sitting: Lateral trunk flexion to L using pillows under L side, lowering and pushing back up to sitting  10-15 sec x 3 reps Thoracic extension in sitting using coregeous ball T-spine     [facility] Adult PT Treatment:                                                DATE: 10/22/2023 Self care: Discussed and practiced sit to supine and supine to sit through sidelying position, avoiding twisting and/or sitting straight up from supine  Discussed use of noodle and coregeous ball for sitting posture  Manual Therapy: L sidelying: Soft tissue mobilization to the muscles of the R posterior/posterolaterap hip Soft tissue mobilization to the R quadratus  lumborum/lumbar paraspinals Caudal glides to the R innominate (hip drop) Cephalad glides to the R lower rib cage Pelvis/sacral rotational glides Hooklying: Hip distraction R using towel 20-30 sec hold x 5 reps  Therapeutic activity:   Thoracic extension in sitting using coregeous ball T-spine  Therapeutic exercise:   Gentle knee to chest 10 sec hold x 5  Happy baby with patient clasping knees; gentle rock side to side   Seated chin tuck placing fingers on chin for positional awareness and gentle overpressure - coregeous ball at T-spine    [facility] Adult PT Treatment:                                                DATE: 10/17/2023 Therapeutic Exercise: Discussed/demonstrated how to perform hooklying self-lumbar traction using strap for home program Manual Therapy: L sidelying: Soft tissue mobilization to the muscles of the R posterior/posterolaterap hip Soft tissue mobilization to the R quadratus lumborum/lumbar paraspinals Caudal glides to the R innominate (hip drop) Cephalad glides to the R lower rib cage Pelvis/sacral rotational glides Hooklying: Long axis posterolateral hip glides, L side, working into greater amounts of adduction Manual lumbar traction using mobilization strap     [facility] Adult PT Treatment:                                                DATE: 10/10/2023 Therapeutic Exercise: Supine Manual stretching of the R hip into flexion + adduction and flexion + abduction + external rotation to stretch the posterior/posterolateral hip Manual Therapy: L sidelying: Soft tissue mobilization to the muscles of the R posterior/posterolaterap hip Soft tissue mobilization to the R quadratus lumborum/lumbar paraspinals Caudal glides to the R innominate (hip drop) Cephalad glides to the R lower rib cage  [facility] Adult PT Treatment:  DATE: 10/08/2023 Review of evaluation findings; POC, HEP   PATIENT EDUCATION:  Education details: POC;  HEP  Person educated: Patient Education method: Explanation, Demonstration, Tactile cues, Verbal cues, and Handouts Education comprehension: verbalized understanding, returned demonstration, verbal cues required, tactile cues required, and needs further education  HOME EXERCISE PROGRAM: Access Code: [email or web address] URL: [email or web address] Date: 10/29/2023 Prepared by: Glennis Borger  Exercises - Standing Lumbar Extension  - 2 x daily - 7 x weekly - 1 sets - 2-3 reps - 2-3 sec  hold - Supine Double Knee to Chest  - 2 x daily - 7 x weekly - 1 sets - 3-5 reps - 10 sec  hold - Supine Pelvic Floor Stretch - Hands on Knees  - 2 x daily - 7 x weekly - 1 sets - 3-5 reps - 10 sec  hold - Seated Cervical Retraction  - 2 x daily - 7 x weekly - 1-2 sets - 5-10 reps - 10 sec  hold - Standing Bilateral Low Shoulder Row with Anchored Resistance  - 2 x daily - 7 x weekly - 1-3 sets - 10 reps - 2-3 sec  hold - Shoulder extension with resistance - Neutral  - 1 x daily - 7 x weekly - 1-2 sets - 10 reps - 3-5 sec  hold  Patient Education - Hospital doctor - Office Posture  ASSESSMENT:  CLINICAL IMPRESSION: Avelardo reports continued positive response to manual treatment with decreased pain and improvement in function. Patient has persistent tightness in the R lumbar/posterior lateral hip area. Continued with gentle L side bending in sitting using L UE to lower and push up from stretch; stretching over black bolster and pillows. Added trial of lumbar flexion standing with large red swiss ball and core strengthening in standing with TB. Patient is progressing well toward stated goals of therapy.   Evaluation: Patient is a 78 y.o. male who was seen today for physical therapy evaluation and treatment for radicular LBP. He has a history of chronic, recurrent LBP with R LE radicular symptoms. Recent flare up of symptoms 08/22/23 initially responded to medication and rest with partial resolution until  patient re-injured back lifting and twisting when moving a box 09/27/23. He has more sever pain with pain radiating into the R posterior hip/buttock and at times into the R LE. He demonstrated centralization of symptoms with extension through lumbar spine and sitting with 3 inch foam roll along spine vertically. He has poor lumbar and LE ROM/mobility; significant core weakness; LE weakness; poor body mechanics; abnormal posture; limited functional activities and ADL's due to pain. Patient will benefit from PT to address deficits identified.    OBJECTIVE IMPAIRMENTS: Abnormal gait, decreased activity tolerance, decreased balance, decreased mobility, decreased ROM, decreased strength, impaired flexibility, improper body mechanics, postural dysfunction, and pain.    GOALS: Goals reviewed with patient? Yes  SHORT TERM GOALS: Target date: 11/05/2023   Independent in initial HEP Baseline: Goal status: INITIAL  2.  Increase trunk ROM/mobility to WFL's for functional activities  Baseline:  Goal status: INITIAL  3.  Independent in transfers and transitional movements with proper body mechanics  Baseline:  Goal status: INITIAL   LONG TERM GOALS: Target date: 12/03/2023   Decrease LBP and R LE pain by 75% allowing patient to return to normal activities  Baseline:  Goal status: INITIAL  2.  Increase strength bilat LE's to 4+/5  Baseline:  Goal status: INITIAL  3.  Patient demonstrates/reports ability to walk for 20-30 minutes  without increased pain/LE symptoms  Baseline:  Goal status: INITIAL  4.  Independent in HEP including aquatic program as indicated  Baseline:  Goal status: INITIAL  5.  Improve Modified Owestry score by 10-15 points   Baseline: 34/50; 68%  Goal status: INITIAL   PLAN:  PT FREQUENCY: 2x/week  PT DURATION: 8 weeks  PLANNED INTERVENTIONS: 97110-Therapeutic exercises, 97530- Therapeutic activity, 97112- Neuromuscular re-education, 97535- Self Care, [id number]-  Manual therapy, [phone]- Aquatic Therapy, Patient/Family education, Dry Needling, Joint mobilization, Cryotherapy, and Moist heat.  PLAN FOR NEXT SESSION: review and progress with exercises; continue spine care and body mechanics education; manual work and modalities as indicated    Erial Fikes P Giovanni Biby, PT 10/29/2023, 2:54 PM

## 2023-10-31 ENCOUNTER — Other Ambulatory Visit: Payer: Self-pay | Admitting: Nurse Practitioner

## 2023-10-31 ENCOUNTER — Ambulatory Visit

## 2023-10-31 DIAGNOSIS — M5441 Lumbago with sciatica, right side: Secondary | ICD-10-CM

## 2023-10-31 DIAGNOSIS — R252 Cramp and spasm: Secondary | ICD-10-CM

## 2023-10-31 DIAGNOSIS — R293 Abnormal posture: Secondary | ICD-10-CM

## 2023-10-31 DIAGNOSIS — M6281 Muscle weakness (generalized): Secondary | ICD-10-CM

## 2023-10-31 DIAGNOSIS — R29898 Other symptoms and signs involving the musculoskeletal system: Secondary | ICD-10-CM

## 2023-10-31 NOTE — Therapy (Signed)
 OUTPATIENT PHYSICAL THERAPY THORACOLUMBAR TREATMENT   Patient Name: Alex Boyle MRN: [id number] DOB:[birth year], 78 y.o., male Today's Date: 10/31/2023  END OF SESSION:  PT End of Session - 10/31/23 1552     Visit Number 7    Number of Visits 16    Date for PT Re-Evaluation 12/03/23    Authorization Type BCBS Federal primary; medicare secondary    Progress Note Due on Visit 10    PT Start Time 1406    PT Stop Time 1444    PT Time Calculation (min) 38 min    Activity Tolerance Patient tolerated treatment well               Past Medical History:  Diagnosis Date   Allergy    Arthritis    Asthma    COPD (chronic obstructive pulmonary disease) (HCC)    boderline per pt   Diverticulitis    Hyperlipidemia    Hypertension    Myocardial infarction (HCC)    Sleep apnea    uses bipap   Sleep apnea in adult    Past Surgical History:  Procedure Laterality Date   COLON SURGERY     COLONOSCOPY     CORONARY STENT PLACEMENT     x2   FOOT SURGERY     JOINT REPLACEMENT     knee   LAPAROSCOPIC TRANSABDOMINAL HERNIA     MANDIBLE SURGERY     skin cancer     basil cell removed on back   UPPER GASTROINTESTINAL ENDOSCOPY     Patient Active Problem List   Diagnosis Date Noted   Radicular low back pain 10/01/2023   Bronchitis 06/13/2021   Healthcare maintenance 11/14/2020   History of colon polyps 11/14/2020   Barrett's esophagus with dysplasia 11/14/2020   Neuropathy 11/14/2020   Asthma exacerbation 04/22/2019   Lumbar strain 04/22/2019   Venous insufficiency 01/08/2019   Numbness and tingling of both feet 01/08/2019   History of basal cell cancer 01/08/2019   Hyperlipidemia LDL goal <70 10/13/2018   Essential hypertension 09/23/2018   CAD (coronary artery disease) 09/23/2018   Asthma 09/23/2018   OSA (obstructive sleep apnea) 09/23/2018   Benign prostatic hyperplasia with lower urinary tract symptoms 09/23/2018    PCP: Dr Trecia Friends  REFERRING PROVIDER: Odette Benjamin, NP  REFERRING DIAG: Radicular LBP  Rationale for Evaluation and Treatment: Rehabilitation  THERAPY DIAG:  Acute right-sided low back pain with right-sided sciatica  Other symptoms and signs involving the musculoskeletal system  Muscle weakness (generalized)  Abnormal posture  Cramp and spasm  ONSET DATE: 08/22/23; 09/27/23  SUBJECTIVE:  SUBJECTIVE STATEMENT: 10/31/2023: Patient returned to the clinic stating his low back pain continues to do much better. He still has some discomfort, but he is moving much better and no longer needs assistance from his wife to do daily tasks like dressing. He has noticed that the past couple of nights he has woken up sleeping on his back. He used to never sleep on his back and finds he is a little stiffer getting out of bed when he sleeps in this way.   Evaluation: History of chronic LBP increased recently and treated with prednisone  and tramadol  with improvement. Re-injured ~2-3 weeks ago with pain becoming more severe. Initially injured LB 08/22/23 lifting something in his yard. Doing better with meds and rest until re-injury 09/27/23 when twisting with box at the food bank. Pain is the "worst it's ever been" and has not subsided with meds or usual exercises. He has tried hot baths, heating pad and meds with no change. Pain is worse on the R LB than L. He has pain in the R buttock and posterolateral hip as well as into to the R shoulder and neck.   PERTINENT HISTORY:  Chronic LBP for HS football injury; L TKA; R foot pain x ~ 2-3 years; 5 surgeries on R ankle; 06/10/20 one surgery R foot with fusion of toes; CAD; MI's 12/12 and 4/13 with stent placement; 58% heart ejection fracture; ~ 9 abdominal surgeries last abdominal surgery  2008; GERD  PAIN:  Are you having pain? Yes: NPRS scale: 1-2/10  Pain location: R > L LB; R posterior hip to lateral hip; pain into the R shoulder and neck  Pain description: aching Aggravating factors: moving; turning to R; lifting R LE; lying down to sleep at night; rolling over  Relieving factors: hot bath for 20-30 min; heating pad temporary relief; walking slow pace OK   PRECAUTIONS: None   WEIGHT BEARING RESTRICTIONS: No  FALLS:  Has patient fallen in last 6 months? 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  DIAGNOSTIC FINDINGS:  None for lumbar spine   PATIENT SURVEYS:  Modified Oswestry 34/50; 68%   SENSATION: WFL  MUSCLE LENGTH: Hamstrings: Right 65 deg; Left 70 deg Thomas test: tight right > left  POSTURE: rounded shoulders, forward head, decreased lumbar lordosis, increased thoracic kyphosis, and flexed trunk   PALPATION: Pain with PA mobs lower lumbar spine; tightness R > L lumbar musculature; posterior hip/buttock  LUMBAR ROM:   AROM eval  Flexion 20% using hands on thighs up/down pain R LB  Extension 20% no pain   Right lateral flexion 50% no pain  Left lateral flexion 45% pain R LB  Right rotation 20%   Left rotation 20%    (Blank rows = not tested)  LOWER EXTREMITY ROM:  limited mobility R > L throughout hip especially in rotation  Active   Right eval Left eval  Hip flexion  Hip extension    Hip abduction    Hip adduction    Hip internal rotation    Hip external rotation    Knee flexion    Knee extension    Ankle dorsiflexion    Ankle plantarflexion    Ankle inversion    Ankle eversion     (Blank rows = not tested)  LOWER EXTREMITY MMT:    MMT Right eval Left eval  Hip flexion 4- 4  Hip extension 4- 4-  Hip abduction 3+ 4-  Hip adduction    Hip internal rotation    Hip external rotation    Knee flexion    Knee extension    Ankle dorsiflexion    Ankle plantarflexion    Ankle inversion    Ankle eversion     (Blank rows = not tested)  LUMBAR SPECIAL TESTS:  Straight leg raise test: Negative and Slump test: Negative  FUNCTIONAL TESTS:  5 times sit to stand: 13.33 sec R LB pain   GAIT: Distance walked: 40 feet Assistive device utilized: None Level of assistance: Complete Independence Comments: antalgic gait; trunk flexed forward; decreased weight bearing phase R LE in stance   [facility] Adult PT Treatment:                                                DATE: 10/31/2023 Therapeutic Exercise: Sidelying: Manual R hip extension stretch blocking at greater trochanter +/- knee flexion Manual L hip extension stretch blocking at greater trochanter +/- knee flexion Manual bilateral hip extension stretch blocking at sacrum + knee flexion Manual Therapy: L sidelying: Soft tissue mobilization to R posterolateral hip musculature Soft tissue mobilization to R quadratus lumborum/lumbar paraspinals Long axis posterior glide to R femur + anterior pelvic rotation glides  [facility] Adult PT Treatment:                                                DATE: 10/29/2023 Self care: Working on sit to supine and supine to sit through sidelying position, avoiding twisting and/or sitting straight up from supine  Using noodle and coregeous ball for sitting posture  Manual Therapy: L sidelying: Soft tissue mobilization to the  muscles of the R posterior/posterolaterap hip Soft tissue mobilization to the R quadratus lumborum/lumbar paraspinals Caudal glides to the R innominate (hip drop) Cephalad glides to the R lower rib cage Pelvis/sacral rotational glides Therapeutic activity:  Standing  Rolling over large red therapeutic ball with ball on table to provide gentle stretch through the lumbar spine ~ 30-45 sec x 3  Row green TB 3 sec x 10 x 2  Shoulder extension green TB 3 sec x 10 x 2  Sidelying  Sidelying hips and knees flexed pillow between knees hip protraction pressing knee forward gently x 5-8 Sitting: Lateral trunk flexion to L using pillows under L side, lowering and pushing back up to sitting  10-15 sec x 3 reps Thoracic extension in sitting using coregeous ball T-spine     [facility] Adult PT Treatment:  DATE: 10/24/2023 Self care: Reviewed and practiced sit to supine and supine to sit through sidelying position, avoiding twisting and/or sitting straight up from supine  Reviewed use of noodle and coregeous ball for sitting posture  Manual Therapy: L sidelying: Soft tissue mobilization to the muscles of the R posterior/posterolaterap hip Soft tissue mobilization to the R quadratus lumborum/lumbar paraspinals Caudal glides to the R innominate (hip drop) Cephalad glides to the R lower rib cage Pelvis/sacral rotational glides Sitting: Lateral trunk flexion to L using pillows under L side, lowering and pushing back up to sitting  10-15 sec x 3 reps  Therapeutic activity:  Sidelying  Sidelying hips and knees flexed pillow between knees hip protraction pressing knee forward gently x 5-8 Sitting: Lateral trunk flexion to L using pillows under L side, lowering and pushing back up to sitting  10-15 sec x 3 reps Thoracic extension in sitting using coregeous ball T-spine    PATIENT EDUCATION:  Education details: POC; HEP  Person educated: Patient Education  method: Programmer, multimedia, Facilities manager, Actor cues, Verbal cues, and Handouts Education comprehension: verbalized understanding, returned demonstration, verbal cues required, tactile cues required, and needs further education  HOME EXERCISE PROGRAM: Access Code: [email or web address] URL: [email or web address] Date: 10/29/2023 Prepared by: Celyn Holt  Exercises - Standing Lumbar Extension  - 2 x daily - 7 x weekly - 1 sets - 2-3 reps - 2-3 sec  hold - Supine Double Knee to Chest  - 2 x daily - 7 x weekly - 1 sets - 3-5 reps - 10 sec  hold - Supine Pelvic Floor Stretch - Hands on Knees  - 2 x daily - 7 x weekly - 1 sets - 3-5 reps - 10 sec  hold - Seated Cervical Retraction  - 2 x daily - 7 x weekly - 1-2 sets - 5-10 reps - 10 sec  hold - Standing Bilateral Low Shoulder Row with Anchored Resistance  - 2 x daily - 7 x weekly - 1-3 sets - 10 reps - 2-3 sec  hold - Shoulder extension with resistance - Neutral  - 1 x daily - 7 x weekly - 1-2 sets - 10 reps - 3-5 sec  hold  Patient Education - Hospital doctor - Office Posture  ASSESSMENT:  CLINICAL IMPRESSION: As symptoms are more controlled, more focus was placed on improving hip ROM restrictions into extension to allow patient to achieve a more upright posture. Both R and L hips were restricted, though R was more restricted than L. When stretching both hips into extension in sidelying, patient felt a cavitation in his lumbar region but this did not appear to create any lasting discomfort. He did note some soreness when standing from the table at the end of the session, but felt this was from the more aggressive stretching today, not an adverse response to treatment. Plan to continue working on hip extension at future sessions, though did not a home exercise for this yet due to potential for the exercise to flare up symptoms. Physical therapy remains indicated.  Evaluation: Patient is a 78 y.o. male who was seen today for physical  therapy evaluation and treatment for radicular LBP. He has a history of chronic, recurrent LBP with R LE radicular symptoms. Recent flare up of symptoms 08/22/23 initially responded to medication and rest with partial resolution until patient re-injured back lifting and twisting when moving a box 09/27/23. He has more sever pain with pain radiating into the R posterior hip/buttock and at times into  the R LE. He demonstrated centralization of symptoms with extension through lumbar spine and sitting with 3 inch foam roll along spine vertically. He has poor lumbar and LE ROM/mobility; significant core weakness; LE weakness; poor body mechanics; abnormal posture; limited functional activities and ADL's due to pain. Patient will benefit from PT to address deficits identified.    OBJECTIVE IMPAIRMENTS: Abnormal gait, decreased activity tolerance, decreased balance, decreased mobility, decreased ROM, decreased strength, impaired flexibility, improper body mechanics, postural dysfunction, and pain.    GOALS: Goals reviewed with patient? Yes  SHORT TERM GOALS: Target date: 11/05/2023   Independent in initial HEP Baseline: Goal status: INITIAL  2.  Increase trunk ROM/mobility to WFL's for functional activities  Baseline:  Goal status: INITIAL  3.  Independent in transfers and transitional movements with proper body mechanics  Baseline:  Goal status: INITIAL   LONG TERM GOALS: Target date: 12/03/2023   Decrease LBP and R LE pain by 75% allowing patient to return to normal activities  Baseline:  Goal status: INITIAL  2.  Increase strength bilat LE's to 4+/5  Baseline:  Goal status: INITIAL  3.  Patient demonstrates/reports ability to walk for 20-30 minutes without increased pain/LE symptoms  Baseline:  Goal status: INITIAL  4.  Independent in HEP including aquatic program as indicated  Baseline:  Goal status: INITIAL  5.  Improve Modified Owestry score by 10-15 points   Baseline: 34/50;  68%  Goal status: INITIAL   PLAN:  PT FREQUENCY: 2x/week  PT DURATION: 8 weeks  PLANNED INTERVENTIONS: 97110-Therapeutic exercises, 97530- Therapeutic activity, 97112- Neuromuscular re-education, 97535- Self Care, [id number]- Manual therapy, [phone]- Aquatic Therapy, Patient/Family education, Dry Needling, Joint mobilization, Cryotherapy, and Moist heat.  PLAN FOR NEXT SESSION: review and progress with exercises; continue spine care and body mechanics education; manual work and modalities as indicated    Zoe Hinds, PT 10/31/2023, 3:54 PM

## 2023-11-05 ENCOUNTER — Ambulatory Visit: Admitting: Rehabilitative and Restorative Service Providers"

## 2023-11-05 ENCOUNTER — Encounter: Payer: Self-pay | Admitting: Rehabilitative and Restorative Service Providers"

## 2023-11-05 DIAGNOSIS — R293 Abnormal posture: Secondary | ICD-10-CM

## 2023-11-05 DIAGNOSIS — M5441 Lumbago with sciatica, right side: Secondary | ICD-10-CM | POA: Diagnosis not present

## 2023-11-05 DIAGNOSIS — R252 Cramp and spasm: Secondary | ICD-10-CM

## 2023-11-05 DIAGNOSIS — R29898 Other symptoms and signs involving the musculoskeletal system: Secondary | ICD-10-CM

## 2023-11-05 DIAGNOSIS — M6281 Muscle weakness (generalized): Secondary | ICD-10-CM

## 2023-11-05 NOTE — Therapy (Addendum)
 OUTPATIENT PHYSICAL THERAPY THORACOLUMBAR TREATMENT PHYSICAL THERAPY DISCHARGE SUMMARY  Visits from Start of Care: 8  Current functional level related to goals / functional outcomes: See progress note for discharge status    Remaining deficits: Unknown    Education / Equipment: HEP    Patient agrees to discharge. Patient goals were partially met. Patient is being discharged due to not returning since the last visit. Asante Blanda P. Ina PT, MPH 02/18/24 3:24 PM     Patient Name: Alex Boyle MRN: [id number] DOB:February 23, 1946, 78 y.o., male, male Today's Date: 11/05/2023  END OF SESSION:  PT End of Session - 11/05/23 1401     Visit Number 8    Number of Visits 16    Date for PT Re-Evaluation 12/03/23    Authorization Type BCBS Federal primary; medicare secondary    Progress Note Due on Visit 10    PT Start Time 1400    PT Stop Time 1442    PT Time Calculation (min) 42 min    Activity Tolerance Patient tolerated treatment well               Past Medical History:  Diagnosis Date   Allergy    Arthritis    Asthma    COPD (chronic obstructive pulmonary disease) (HCC)    boderline per pt   Diverticulitis    Hyperlipidemia    Hypertension    Myocardial infarction (HCC)    Sleep apnea    uses bipap   Sleep apnea in adult    Past Surgical History:  Procedure Laterality Date   COLON SURGERY     COLONOSCOPY     CORONARY STENT PLACEMENT     x2   FOOT SURGERY     JOINT REPLACEMENT     knee   LAPAROSCOPIC TRANSABDOMINAL HERNIA     MANDIBLE SURGERY     skin cancer     basil cell removed on back   UPPER GASTROINTESTINAL ENDOSCOPY     Patient Active Problem List   Diagnosis Date Noted   Radicular low back pain 10/01/2023   Bronchitis 06/13/2021   Healthcare maintenance 11/14/2020   History of colon polyps 11/14/2020   Barrett's esophagus with dysplasia 11/14/2020   Neuropathy 11/14/2020   Asthma exacerbation 04/22/2019   Lumbar strain 04/22/2019   Venous  insufficiency 01/08/2019   Numbness and tingling of both feet 01/08/2019   History of basal cell cancer 01/08/2019   Hyperlipidemia LDL goal <70 10/13/2018   Essential hypertension 09/23/2018   CAD (coronary artery disease) 09/23/2018   Asthma 09/23/2018   OSA (obstructive sleep apnea) 09/23/2018   Benign prostatic hyperplasia with lower urinary tract symptoms 09/23/2018    PCP: Dr Elsie Clyde Lent  REFERRING PROVIDER: Tinnie DELENA Harada, NP  REFERRING DIAG: Radicular LBP  Rationale for Evaluation and Treatment: Rehabilitation  THERAPY DIAG:  Acute right-sided low back pain with right-sided sciatica  Other symptoms and signs involving the musculoskeletal system  Muscle weakness (generalized)  Abnormal posture  Cramp and spasm  ONSET DATE: 08/22/23; 09/27/23  SUBJECTIVE:  SUBJECTIVE STATEMENT: 11/05/2023: Patient reports that he had increased pain after the last visit for about 24 hours but felt much better by Saturday. Today he has a tender place in the R LB area to R posterior hip.    Evaluation: History of chronic LBP increased recently and treated with prednisone  and tramadol  with improvement. Re-injured ~2-3 weeks ago with pain becoming more severe. Initially injured LB 08/22/23 lifting something in his yard. Doing better with meds and rest until re-injury 09/27/23 when twisting with box at the food bank. Pain is the worst it's ever been and has not subsided with meds or usual exercises. He has tried hot baths, heating pad and meds with no change. Pain is worse on the R LB than L. He has pain in the R buttock and posterolateral hip as well as into to the R shoulder and neck.   PERTINENT HISTORY:  Chronic LBP for HS football injury; L TKA; R foot pain x ~ 2-3 years; 5 surgeries on R ankle;  06/10/20 one surgery R foot with fusion of toes; CAD; MI's 12/12 and 4/13 with stent placement; 58% heart ejection fracture; ~ 9 abdominal surgeries last abdominal surgery 2008; GERD  PAIN:  Are you having pain? Yes: NPRS scale: 1/10  Pain location: R > L LB; R posterior hip to lateral hip; pain into the R shoulder and neck  Pain description: aching Aggravating factors: moving; turning to R; lifting R LE; lying down to sleep at night; rolling over  Relieving factors: hot bath for 20-30 min; heating pad temporary relief; walking slow pace OK   PRECAUTIONS: None   WEIGHT BEARING RESTRICTIONS: No  FALLS:  Has patient fallen in last 6 months? 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  DIAGNOSTIC FINDINGS:  None for lumbar spine   PATIENT SURVEYS:  Modified Oswestry 34/50; 68%   SENSATION: WFL  MUSCLE LENGTH: Hamstrings: Right 65 deg; Left 70 deg Thomas test: tight right > left  POSTURE: rounded shoulders, forward head, decreased lumbar lordosis, increased thoracic kyphosis, and flexed trunk   PALPATION: Pain with PA mobs lower lumbar spine; tightness R > L lumbar musculature; posterior hip/buttock  LUMBAR ROM:   AROM eval  Flexion 20% using hands on thighs up/down pain R LB  Extension 20% no pain   Right lateral flexion 50% no pain  Left lateral flexion 45% pain R LB  Right  rotation 20%   Left rotation 20%    (Blank rows = not tested)  LOWER EXTREMITY ROM:  limited mobility R > L throughout hip especially in rotation  Active  Right eval Left eval  Hip flexion    Hip extension    Hip abduction    Hip adduction    Hip internal rotation    Hip external rotation    Knee flexion    Knee extension    Ankle dorsiflexion  Ankle plantarflexion    Ankle inversion    Ankle eversion     (Blank rows = not tested)  LOWER EXTREMITY MMT:    MMT Right eval Left eval  Hip flexion 4- 4  Hip extension 4- 4-  Hip abduction 3+ 4-  Hip adduction    Hip internal rotation    Hip external rotation    Knee flexion    Knee extension    Ankle dorsiflexion    Ankle plantarflexion    Ankle inversion    Ankle eversion     (Blank rows = not tested)  LUMBAR SPECIAL TESTS:  Straight leg raise test: Negative and Slump test: Negative  FUNCTIONAL TESTS:  5 times sit to stand: 13.33 sec R LB pain   GAIT: Distance walked: 40 feet Assistive device utilized: None Level of assistance: Complete Independence Comments: antalgic gait; trunk flexed forward; decreased weight bearing phase R LE in stance   [facility] Adult PT Treatment:                                                DATE: 11/05/23 Therapeutic Exercise: Supine  Shoulder flexion green TB btn hands x 10  Bridging 3 sec x 10  Manual Therapy: Sidelying  PT assist to stretch into hip extension from hip/knee flexion x 10 sec x 5 PT assist for patient to extend hip from hip flexed position, knee flexed x 5  Therapeutic Activity: Therapeutic activity:  Standing  Standing back to wall pushing hips away from wall 5 sec x 10  Rolling over large red therapeutic ball with ball on table to provide gentle stretch through the lumbar spine ~ 60 sec x 3  Standing - back to counter, hips at countertop working on gentle hip extension by extension through back ~ 15-20 sec x 3  Row blue TB 3 sec x 10 x 2  Shoulder extension  blue TB 3 sec x 10 x 2  Standing facing counter hips touching counter hip extension 3 sec x 10 R/L  Sitting: Lateral trunk flexion to L using pillows under L side, lowering and pushing back up to sitting  10-15 sec x 3 reps Thoracic extension in sitting using coregeous ball T-spine   Self Care: Continued work on transitional movements and transfers using good body mechanics     [facility] Adult PT Treatment:                                                DATE: 10/31/2023 Therapeutic Exercise: Sidelying: Manual R hip extension stretch blocking at greater trochanter +/- knee flexion Manual L hip extension stretch blocking at greater trochanter +/- knee flexion Manual bilateral hip extension stretch blocking at sacrum + knee flexion Manual Therapy: L sidelying: Soft tissue mobilization to R posterolateral hip musculature Soft tissue mobilization to R quadratus lumborum/lumbar paraspinals Long axis posterior glide to R femur + anterior pelvic rotation glides  [facility] Adult PT Treatment:                                                DATE:  10/29/2023 Self care: Working on sit to supine and supine to sit through sidelying position, avoiding twisting and/or sitting straight up from supine  Using noodle and coregeous ball for sitting posture  Manual Therapy: L sidelying: Soft tissue mobilization to the muscles of the R posterior/posterolaterap hip Soft tissue mobilization to the R quadratus lumborum/lumbar paraspinals Caudal glides to the R innominate (hip drop) Cephalad glides to the R lower rib cage Pelvis/sacral rotational glides Therapeutic activity:  Standing  Rolling over large red therapeutic ball with ball on table to provide gentle stretch through the lumbar spine ~ 30-45 sec x 3  Row green TB 3 sec x 10 x 2  Shoulder extension green TB 3 sec x 10 x 2  Sidelying  Sidelying hips and knees flexed pillow between knees hip protraction pressing knee forward gently x 5-8 Sitting: Lateral  trunk flexion to L using pillows under L side, lowering and pushing back up to sitting  10-15 sec x 3 reps Thoracic extension in sitting using coregeous ball T-spine     [facility] Adult PT Treatment:                                                DATE: 10/24/2023 Self care: Reviewed and practiced sit to supine and supine to sit through sidelying position, avoiding twisting and/or sitting straight up from supine  Reviewed use of noodle and coregeous ball for sitting posture  Manual Therapy: L sidelying: Soft tissue mobilization to the muscles of the R posterior/posterolaterap hip Soft tissue mobilization to the R quadratus lumborum/lumbar paraspinals Caudal glides to the R innominate (hip drop) Cephalad glides to the R lower rib cage Pelvis/sacral rotational glides Sitting: Lateral trunk flexion to L using pillows under L side, lowering and pushing back up to sitting  10-15 sec x 3 reps  Therapeutic activity:  Sidelying  Sidelying hips and knees flexed pillow between knees hip protraction pressing knee forward gently x 5-8 Sitting: Lateral trunk flexion to L using pillows under L side, lowering and pushing back up to sitting  10-15 sec x 3 reps Thoracic extension in sitting using coregeous ball T-spine    PATIENT EDUCATION:  Education details: POC; HEP  Person educated: Patient Education method: Programmer, multimedia, Facilities manager, Actor cues, Verbal cues, and Handouts Education comprehension: verbalized understanding, returned demonstration, verbal cues required, tactile cues required, and needs further education  HOME EXERCISE PROGRAM: Access Code: [email or web address] URL: [email or web address] Date: 11/05/2023 Prepared by: Bodin Gorka  Exercises - Standing Lumbar Extension  - 2 x daily - 7 x weekly - 1 sets - 2-3 reps - 2-3 sec  hold - Supine Double Knee to Chest  - 2 x daily - 7 x weekly - 1 sets - 3-5 reps - 10 sec  hold - Supine Pelvic Floor Stretch - Hands on Knees  - 2 x daily - 7  x weekly - 1 sets - 3-5 reps - 10 sec  hold - Seated Cervical Retraction  - 2 x daily - 7 x weekly - 1-2 sets - 5-10 reps - 10 sec  hold - Standing Bilateral Low Shoulder Row with Anchored Resistance  - 2 x daily - 7 x weekly - 1-3 sets - 10 reps - 2-3 sec  hold - Shoulder extension with resistance - Neutral  - 1 x daily - 7 x weekly - 1-2 sets -  10 reps - 3-5 sec  hold - Standing Hip Extension with Counter Support  - 2 x daily - 7 x weekly - 1-2 sets - 10 reps - 3-5 sec  hold  Patient Education - Hospital doctor - Office Posture  ASSESSMENT:  CLINICAL IMPRESSION: Focus on stretching and core/LE strengthening in sidelying, sitting and standing. Good response to gentle stretch over red therapeutic ball on low table. Tolerated gentle hip extension standing back to wall. Continued with TB exercises in standing.   Evaluation: Patient is a 78 y.o. male who was seen today for physical therapy evaluation and treatment for radicular LBP. He has a history of chronic, recurrent LBP with R LE radicular symptoms. Recent flare up of symptoms 08/22/23 initially responded to medication and rest with partial resolution until patient re-injured back lifting and twisting when moving a box 09/27/23. He has more sever pain with pain radiating into the R posterior hip/buttock and at times into the R LE. He demonstrated centralization of symptoms with extension through lumbar spine and sitting with 3 inch foam roll along spine vertically. He has poor lumbar and LE ROM/mobility; significant core weakness; LE weakness; poor body mechanics; abnormal posture; limited functional activities and ADL's due to pain. Patient will benefit from PT to address deficits identified.    OBJECTIVE IMPAIRMENTS: Abnormal gait, decreased activity tolerance, decreased balance, decreased mobility, decreased ROM, decreased strength, impaired flexibility, improper body mechanics, postural dysfunction, and pain.    GOALS: Goals  reviewed with patient? Yes  SHORT TERM GOALS: Target date: 11/05/2023   Independent in initial HEP Baseline: Goal status: INITIAL  2.  Increase trunk ROM/mobility to WFL's for functional activities  Baseline:  Goal status: INITIAL  3.  Independent in transfers and transitional movements with proper body mechanics  Baseline:  Goal status: INITIAL   LONG TERM GOALS: Target date: 12/03/2023   Decrease LBP and R LE pain by 75% allowing patient to return to normal activities  Baseline:  Goal status: INITIAL  2.  Increase strength bilat LE's to 4+/5  Baseline:  Goal status: INITIAL  3.  Patient demonstrates/reports ability to walk for 20-30 minutes without increased pain/LE symptoms  Baseline:  Goal status: INITIAL  4.  Independent in HEP including aquatic program as indicated  Baseline:  Goal status: INITIAL  5.  Improve Modified Owestry score by 10-15 points   Baseline: 34/50; 68%  Goal status: INITIAL   PLAN:  PT FREQUENCY: 2x/week  PT DURATION: 8 weeks  PLANNED INTERVENTIONS: 97110-Therapeutic exercises, 97530- Therapeutic activity, 97112- Neuromuscular re-education, 97535- Self Care, [id number]- Manual therapy, [phone]- Aquatic Therapy, Patient/Family education, Dry Needling, Joint mobilization, Cryotherapy, and Moist heat.  PLAN FOR NEXT SESSION: review and progress with exercises; continue spine care and body mechanics education; manual work and modalities as indicated    Clothilde Tippetts P Pallavi Clifton, PT 11/05/2023, 2:02 PM

## 2023-11-07 ENCOUNTER — Encounter: Admitting: Rehabilitative and Restorative Service Providers"

## 2023-11-12 ENCOUNTER — Ambulatory Visit: Admitting: Family Medicine

## 2023-11-12 ENCOUNTER — Encounter: Payer: Self-pay | Admitting: Family Medicine

## 2023-11-12 VITALS — BP 106/64 | HR 82 | Temp 97.6°F | Ht 72.0 in | Wt 209.2 lb

## 2023-11-12 DIAGNOSIS — S39012A Strain of muscle, fascia and tendon of lower back, initial encounter: Secondary | ICD-10-CM | POA: Diagnosis not present

## 2023-11-12 DIAGNOSIS — K22719 Barrett's esophagus with dysplasia, unspecified: Secondary | ICD-10-CM | POA: Diagnosis not present

## 2023-11-12 DIAGNOSIS — K2100 Gastro-esophageal reflux disease with esophagitis, without bleeding: Secondary | ICD-10-CM | POA: Insufficient documentation

## 2023-11-12 MED ORDER — OMEPRAZOLE 40 MG PO CPDR: 40.0000 mg | DELAYED_RELEASE_CAPSULE | Freq: Every day | ORAL | 3 refills | Status: AC

## 2023-11-12 NOTE — Progress Notes (Signed)
 Established Patient Office Visit   Subjective:  Patient ID: Alex Boyle, male    DOB: 03/05/1946  Age: 78 y.o. MRN: [id number]  Chief Complaint  Patient presents with   Back Pain    6 week follow up back pain. Pt states there is some improvement and no longer having back spasm but he can still feel some lower back and right hip pain.     Back Pain Pertinent negatives include no abdominal pain, tingling or weakness.   Encounter Diagnoses  Name Primary?   Strain of lumbar region, initial encounter Yes   Barrett's esophagus with dysplasia    Gastroesophageal reflux disease with esophagitis without hemorrhage    Follow-up of lumbar radiculopathy that is improved with physical therapy.  There is some continuing pain in the right lower back area that is no longer radiating into the leg.  Spasms have resolved.  He is continuing with physical therapy.  History of Barrett's esophagitis.  He is due for a follow-up EGD.  He has been experiencing some reflux.  He has been out of his omeprazole .   Review of Systems  Constitutional: Negative.   HENT: Negative.    Eyes:  Negative for blurred vision, discharge and redness.  Respiratory: Negative.    Cardiovascular: Negative.   Gastrointestinal:  Positive for heartburn. Negative for abdominal pain.  Genitourinary: Negative.   Musculoskeletal:  Positive for back pain. Negative for myalgias.  Skin:  Negative for rash.  Neurological:  Negative for tingling, loss of consciousness and weakness.  Endo/Heme/Allergies:  Negative for polydipsia.     Current Outpatient Medications:    albuterol  (VENTOLIN  HFA) 108 (90 Base) MCG/ACT inhaler, Inhale 1-2 puffs into the lungs every 6 (six) hours as needed for wheezing or shortness of breath., Disp: 1 each, Rfl: 1   B Complex Vitamins (VITAMIN-B COMPLEX PO), Take 1 tablet by mouth daily., Disp: , Rfl:    Cholecalciferol 25 MCG (1000 UT) capsule, Take 1,000 Units by mouth daily., Disp: , Rfl:     clindamycin  (CLEOCIN ) 150 MG capsule, Take 150 mg by mouth as needed (Dental procedure)., Disp: , Rfl:    cyclobenzaprine  (FLEXERIL ) 10 MG tablet, TAKE ONE TABLET BY MOUTH THREE TIMES A DAY AS NEEDED FOR MUSCLE SPASM, Disp: 40 tablet, Rfl: 0   ezetimibe  (ZETIA ) 10 MG tablet, Take 1 tablet (10 mg total) by mouth daily., Disp: 90 tablet, Rfl: 2   finasteride  (PROSCAR ) 5 MG tablet, Take 1qd (Plz sched appt with new provider for future fills) (Patient taking differently: Take 5 mg by mouth daily. Take 1qd (Plz sched appt with new provider for future fills)), Disp: 90 tablet, Rfl: 1   fluticasone  furoate-vilanterol (BREO ELLIPTA ) 200-25 MCG/ACT AEPB, INHALE ONE PUFF BY MOUTH ONE TIME DAILY, Disp: 60 each, Rfl: 5   ipratropium (ATROVENT ) 0.03 % nasal spray, Place 2 sprays into both nostrils every 12 (twelve) hours., Disp: 30 mL, Rfl: 6   lisinopril  (ZESTRIL ) 5 MG tablet, Take 1 tablet (5 mg total) by mouth daily., Disp: 90 tablet, Rfl: 1   metoprolol  tartrate (LOPRESSOR ) 25 MG tablet, Take 1 tablet (25 mg total) by mouth 2 (two) times daily., Disp: 180 tablet, Rfl: 1   montelukast  (SINGULAIR ) 10 MG tablet, TAKE ONE TABLET BY MOUTH AT BEDTIME, Disp: 90 tablet, Rfl: 3   Multiple Vitamins-Minerals (MULTIVITAMIN ADULTS 50+ PO), Take 1 tablet by mouth daily., Disp: , Rfl:    nitroGLYCERIN  (NITROSTAT ) 0.4 MG SL tablet, Place 0.4 mg under the tongue every  5 (five) minutes as needed for chest pain., Disp: , Rfl:    omeprazole  (PRILOSEC ) 40 MG capsule, Take 1 capsule (40 mg total) by mouth daily., Disp: 30 capsule, Rfl: 3   rosuvastatin  (CRESTOR ) 40 MG tablet, Take 1 tablet (40 mg total) by mouth daily. 1rst attempt, patient needs and appt for additional refills, Disp: 30 tablet, Rfl: 0   tamsulosin  (FLOMAX ) 0.4 MG CAPS capsule, Take 1qd (Plz sched appt with new provider for future fills) (Patient taking differently: Take 0.4 mg by mouth daily after supper. Take 1qd (Plz sched appt with new provider for future  fills)), Disp: 90 capsule, Rfl: 0   traMADol  (ULTRAM ) 50 MG tablet, Take 1 tablet (50 mg total) by mouth every 8 (eight) hours as needed for severe pain (pain score 7-10)., Disp: 15 tablet, Rfl: 0   Objective:     BP 106/64   Pulse 82   Temp 97.6 F (36.4 C) (Temporal)   Ht 6' (1.829 m)   Wt 209 lb 3.2 oz (94.9 kg)   SpO2 96%   BMI 28.37 kg/m    Physical Exam Constitutional:      General: He is not in acute distress.    Appearance: Normal appearance. He is not ill-appearing, toxic-appearing or diaphoretic.  HENT:     Head: Normocephalic and atraumatic.     Right Ear: External ear normal.     Left Ear: External ear normal.     Nose: Rhinorrhea present.  Eyes:     General: No scleral icterus.       Right eye: No discharge.        Left eye: No discharge.     Extraocular Movements: Extraocular movements intact.     Conjunctiva/sclera: Conjunctivae normal.  Pulmonary:     Effort: Pulmonary effort is normal. No respiratory distress.  Musculoskeletal:     Lumbar back: No spasms, tenderness or bony tenderness. Normal range of motion. Negative right straight leg raise test and negative left straight leg raise test.  Skin:    General: Skin is warm and dry.  Neurological:     Mental Status: He is alert and oriented to person, place, and time.     Motor: No weakness.     Deep Tendon Reflexes:     Reflex Scores:      Patellar reflexes are 2+ on the right side and 2+ on the left side.      Achilles reflexes are 1+ on the right side and 1+ on the left side. Psychiatric:        Mood and Affect: Mood normal.        Behavior: Behavior normal.      No results found for any visits on 11/12/23.    The ASCVD Risk score (Arnett DK, et al., 2019) failed to calculate for the following reasons:   The valid total cholesterol range is 130 to 320 mg/dL    Assessment & Plan:   Strain of lumbar region, initial encounter  Barrett's esophagus with dysplasia -     Omeprazole ; Take 1  capsule (40 mg total) by mouth daily.  Dispense: 30 capsule; Refill: 3  Gastroesophageal reflux disease with esophagitis without hemorrhage -     Omeprazole ; Take 1 capsule (40 mg total) by mouth daily.  Dispense: 30 capsule; Refill: 3    Return if symptoms worsen or fail to improve.  Continue follow-up with physical therapy.  Demonstrated proper lifting technique.  Will make appointment for follow-up of Barrett's with  Dr. Karene Oto.  Refilled omeprazole .  Tonna Frederic, MD

## 2023-12-27 ENCOUNTER — Other Ambulatory Visit: Payer: Self-pay

## 2023-12-27 ENCOUNTER — Ambulatory Visit: Payer: Self-pay | Admitting: Physician Assistant

## 2023-12-27 ENCOUNTER — Other Ambulatory Visit (INDEPENDENT_AMBULATORY_CARE_PROVIDER_SITE_OTHER)

## 2023-12-27 ENCOUNTER — Telehealth: Payer: Self-pay | Admitting: Gastroenterology

## 2023-12-27 DIAGNOSIS — R1010 Upper abdominal pain, unspecified: Secondary | ICD-10-CM

## 2023-12-27 LAB — CBC WITH DIFFERENTIAL/PLATELET
Basophils Absolute: 0 K/uL (ref 0.0–0.1)
Basophils Relative: 0.4 % (ref 0.0–3.0)
Eosinophils Absolute: 0.1 K/uL (ref 0.0–0.7)
Eosinophils Relative: 0.9 % (ref 0.0–5.0)
HCT: 46.7 % (ref 39.0–52.0)
Hemoglobin: 15.8 g/dL (ref 13.0–17.0)
Lymphocytes Relative: 26.1 % (ref 12.0–46.0)
Lymphs Abs: 1.8 K/uL (ref 0.7–4.0)
MCHC: 33.9 g/dL (ref 30.0–36.0)
MCV: 92.3 fl (ref 78.0–100.0)
Monocytes Absolute: 0.5 K/uL (ref 0.1–1.0)
Monocytes Relative: 7.6 % (ref 3.0–12.0)
Neutro Abs: 4.5 K/uL (ref 1.4–7.7)
Neutrophils Relative %: 65 % (ref 43.0–77.0)
Platelets: 192 K/uL (ref 150.0–400.0)
RBC: 5.06 Mil/uL (ref 4.22–5.81)
RDW: 13.5 % (ref 11.5–15.5)
WBC: 7 K/uL (ref 4.0–10.5)

## 2023-12-27 LAB — COMPREHENSIVE METABOLIC PANEL WITH GFR
ALT: 23 U/L (ref 0–53)
AST: 22 U/L (ref 0–37)
Albumin: 4.5 g/dL (ref 3.5–5.2)
Alkaline Phosphatase: 45 U/L (ref 39–117)
BUN: 9 mg/dL (ref 6–23)
CO2: 32 meq/L (ref 19–32)
Calcium: 9.7 mg/dL (ref 8.4–10.5)
Chloride: 100 meq/L (ref 96–112)
Creatinine, Ser: 0.7 mg/dL (ref 0.40–1.50)
GFR: 89.04 mL/min (ref >60.00)
Glucose, Bld: 84 mg/dL (ref 70–99)
Potassium: 4 meq/L (ref 3.5–5.1)
Sodium: 139 meq/L (ref 135–145)
Total Bilirubin: 0.8 mg/dL (ref 0.2–1.2)
Total Protein: 7.2 g/dL (ref 6.0–8.3)

## 2023-12-27 NOTE — Telephone Encounter (Signed)
 Inbound call from patient stating he is having complications with his esophagus. States this morning he woke up with a mouth full of blood. Patient is very concerned and requesting a call back as soon as possible. Currently scheduled for 8/20. Please advise, thank you.

## 2023-12-27 NOTE — Telephone Encounter (Signed)
 Returned call to patient & he woke up this morning with a small amount of bright red blood in his mouth. His upper abdomen is tender to touch. Denies any n/v. Tolerated water okay this morning, he has not ate anything yet. States he has a long history of GERD/barrett's esophagus/ulcer, he's currently on omeprazole  on 40 mg once daily. He was last seen with Dr. San for endo colon 01/2021. He is scheduled to see him in office on 01/29/24, but would like to know if there are any recommendations in the meantime.

## 2023-12-27 NOTE — Telephone Encounter (Addendum)
 Pt made aware of PA recommendations. Pt denies fever/chills, mouth/teeth issues, or sore throat/sinus. US  scheduled for 01/03/24 at 11:00 at [facility], NPO at midnight. Pt will come in today for labs & has been advised on when/where to go. He verbalized all understanding.

## 2023-12-27 NOTE — Telephone Encounter (Signed)
 Patient 78 year old male with history of CAD with PCI, OSA, diverticulosis status post partial colectomy, asthma, GERD with Barrett's esophagus previous Nissan fundoplication 15 years ago. Last seen in the office 2022 by Dr. San for EGD that showed Barrett's stage C1 M2, loose Nissan fundoplication, gastritis normal duodenum, recall EGD 5 years  Patient calling with upper abdominal discomfort and slight amount of blood in his mouth this morning.  Has not had any recent nausea or vomiting.  Please make sure he does not have any fevers or chills, teeth or mouth issues, sore throughout her sinus issues.  If he has any of those symptoms please follow-up with primary care. Going to go ahead and get an abdominal ultrasound, CBC and c-Met.  Patient has had elevated LFTs previously. In the interim he can increase his omeprazole  to 40 mg twice daily. Advised to go to the ER if there is any severe weakness, severe abdominal pain, vomit blood, dark red blood in your bowel movement, shortness of breath or chest pain.

## 2024-01-03 ENCOUNTER — Ambulatory Visit (HOSPITAL_COMMUNITY)

## 2024-01-04 ENCOUNTER — Ambulatory Visit (HOSPITAL_COMMUNITY)
Admission: RE | Admit: 2024-01-04 | Discharge: 2024-01-04 | Disposition: A | Discharge status: Home / Self Care | Source: Ambulatory Visit | Attending: Physician Assistant | Admitting: Physician Assistant

## 2024-01-04 DIAGNOSIS — R1010 Upper abdominal pain, unspecified: Secondary | ICD-10-CM | POA: Diagnosis present

## 2024-01-15 ENCOUNTER — Telehealth: Payer: Self-pay

## 2024-01-15 NOTE — Telephone Encounter (Signed)
 Copied from CRM #[id number]. Topic: Clinical - Prescription Issue >> Jan 15, 2024  8:57 AM Celestine FALCON wrote: Reason for CRM: Pt stated Dr. Neda increased his dosage from 100mcg to 200 mcg for medication fluticasone  furoate-vilanterol (BREO ELLIPTA ) 200-25 MCG/ACT AEPB. He stated he's not sure if it is his insurance doing this or not, but he doesn't have enough refills for the rest of the year. At this rate, the pt stated he only has enough medication to last through October 2025.   Pt's phone number is [phone] ok to leave a vm.   I called and spoke to pt. I informed pt that Dr Neda has him on Breo 200 mcg. Pt states he only has 2 refills left that will last him until October 2025 and he did not want to be out for November and December. I informed pt that he just needs to give us  a call at least 2 weeks before he is completely out and we can send in more refills for him. Pt verbalized understanding. NFN

## 2024-01-20 ENCOUNTER — Other Ambulatory Visit: Payer: Self-pay

## 2024-01-20 DIAGNOSIS — I10 Essential (primary) hypertension: Secondary | ICD-10-CM

## 2024-01-20 MED ORDER — METOPROLOL TARTRATE 25 MG PO TABS: 25.0000 mg | ORAL_TABLET | Freq: Two times a day (BID) | ORAL | 1 refills | Status: DC

## 2024-01-20 MED ORDER — LISINOPRIL 5 MG PO TABS: 5.0000 mg | ORAL_TABLET | Freq: Every day | ORAL | 1 refills | Status: DC

## 2024-01-21 ENCOUNTER — Telehealth: Payer: Self-pay | Admitting: Pharmacy Technician

## 2024-01-21 NOTE — Telephone Encounter (Signed)
 Refill for metoprolol  tartrate sent to [facility] pharmacy on 01/20/24.

## 2024-01-21 NOTE — Telephone Encounter (Signed)
 Hi, he needs a refill sent to his pharmacy please

## 2024-01-29 ENCOUNTER — Other Ambulatory Visit (INDEPENDENT_AMBULATORY_CARE_PROVIDER_SITE_OTHER)

## 2024-01-29 ENCOUNTER — Ambulatory Visit: Payer: Self-pay | Admitting: Gastroenterology

## 2024-01-29 ENCOUNTER — Ambulatory Visit (INDEPENDENT_AMBULATORY_CARE_PROVIDER_SITE_OTHER): Admitting: Gastroenterology

## 2024-01-29 ENCOUNTER — Encounter: Payer: Self-pay | Admitting: Gastroenterology

## 2024-01-29 VITALS — BP 120/66 | HR 62 | Ht 72.0 in | Wt 202.8 lb

## 2024-01-29 DIAGNOSIS — R1013 Epigastric pain: Secondary | ICD-10-CM

## 2024-01-29 DIAGNOSIS — R933 Abnormal findings on diagnostic imaging of other parts of digestive tract: Secondary | ICD-10-CM

## 2024-01-29 DIAGNOSIS — K920 Hematemesis: Secondary | ICD-10-CM

## 2024-01-29 DIAGNOSIS — K2100 Gastro-esophageal reflux disease with esophagitis, without bleeding: Secondary | ICD-10-CM | POA: Diagnosis not present

## 2024-01-29 DIAGNOSIS — Z860100 Personal history of colon polyps, unspecified: Secondary | ICD-10-CM

## 2024-01-29 DIAGNOSIS — K921 Melena: Secondary | ICD-10-CM

## 2024-01-29 LAB — PROTIME-INR
INR: 1.3 ratio — ABNORMAL HIGH (ref 0.8–1.0)
Prothrombin Time: 13.3 s — ABNORMAL HIGH (ref 9.6–13.1)

## 2024-01-29 MED ORDER — NA SULFATE-K SULFATE-MG SULF 17.5-3.13-1.6 GM/177ML PO SOLN: 1.0000 | Freq: Once | ORAL | 0 refills | Status: AC

## 2024-01-29 NOTE — Patient Instructions (Addendum)
 Your provider has requested that you go to the basement level for lab work before leaving today. Press B on the elevator. The lab is located at the first door on the left as you exit the elevator.  Due to recent changes in healthcare laws, you may see the results of your imaging and laboratory studies on MyChart before your provider has had a chance to review them.  We understand that in some cases there may be results that are confusing or concerning to you. Not all laboratory results come back in the same time frame and the provider may be waiting for multiple results in order to interpret others.  Please give us  48 hours in order for your provider to thoroughly review all the results before contacting the office for clarification of your results.    You have been scheduled for an endoscopy and colonoscopy. Please follow the written instructions given to you at your visit today.  If you use inhalers (even only as needed), please bring them with you on the day of your procedure.  DO NOT TAKE 7 DAYS PRIOR TO TEST- Trulicity (dulaglutide) Ozempic, Wegovy (semaglutide) Mounjaro (tirzepatide) Bydureon Bcise (exanatide extended release)  DO NOT TAKE 1 DAY PRIOR TO YOUR TEST Rybelsus (semaglutide) Adlyxin (lixisenatide) Victoza (liraglutide) Byetta (exanatide) ___________________________________________________________________________  Thank you for entrusting me with your care and for choosing [facility], Dr. Sandor Flatter  _______________________________________________________  If your blood pressure at your visit was 140/90 or greater, please contact your primary care physician to follow up on this.  _______________________________________________________  If you are age 4 or older, your body mass index should be between 23-30. Your Body mass index is 27.5 kg/m. If this is out of the aforementioned range listed, please consider follow up with your Primary Care  Provider.  If you are age 41 or younger, your body mass index should be between 19-25. Your Body mass index is 27.5 kg/m. If this is out of the aformentioned range listed, please consider follow up with your Primary Care Provider.   ________________________________________________________  The [facility] providers would like to encourage you to use MYCHART to communicate with providers for non-urgent requests or questions.  Due to long hold times on the telephone, sending your provider a message by [facility] may be a faster and more efficient way to get a response.  Please allow 48 business hours for a response.  Please remember that this is for non-urgent requests.  _______________________________________________________  [facility] is using a team-based approach to care.  Your team is made up of your doctor and two to three APPS. Our APPS (Nurse Practitioners and Physician Assistants) work with your physician to ensure care continuity for you. They are fully qualified to address your health concerns and develop a treatment plan. They communicate directly with your gastroenterologist to care for you. Seeing the Advanced Practice Practitioners on your physician's team can help you by facilitating care more promptly, often allowing for earlier appointments, access to diagnostic testing, procedures, and other specialty referrals.

## 2024-01-29 NOTE — Progress Notes (Signed)
 Chief Complaint: GERD, Barrett's Esophagus, epigastric pain, melena   Referring Provider:     Berneta Elsie Sayre, MD    HPI:     Alex Boyle is a 78 y.o. male with a history of CAD with PCI, OSA, diverticulosis status post partial colectomy, asthma, GERD with Barrett's esophagus previous [employer] fundoplication 15+ years ago, presenting to the [facility] for evaluation of epigastric pain and melena.  He reports episode a few weeks ago of waking up with blood in his mouth then black stools for the next 1-2 days.  Since then, has had epigastric pain and tenderness to palpation.  No prior similar symptoms.  He called into the office on 12/27/2023 with the symptoms and was sent to the lab and for ultrasound. - 12/27/2023: CBC normal including H/H 15.8/46.7.  CMP normal including BUN/creatinine 9/0.7 - 01/04/2024: Abdominal ultrasound: Contracted GB with cholelithiasis without definitive cholecystitis.  Coarse liver echotexture with possible nodular contours  No further hematemesis or melena since then.  Prior to those symptoms, he had been having breakthrough reflux sxs as well, particualrly nocturnal HB and regurgitation. Sleeps with HOB elevated, avoids eating within a few hours of bedtime. Increased his Prilosec  to 40 mg BID with these sxs.   Separately, he has a longstanding history of reflux and prior diagnosis of Barrett's Esophagus. Index sxs of HB, regurgitation, raspy voice. - 01/27/2021: EGD: Short segment Barrett's Esophagus Prague classification C1 M2 (path: Reflux changes without metaplasia), prior Nissen fundoplication which appeared loose with LES laxity, mild non-H. pylori gastritis, normal duodenum.  Recommended repeat in 5 years due to prior history of Barrett's  History of diverticulosis c/b diverticulitis with perforation requiring partial colectomy in 1990.  Since then has had multiple colonoscopies at outside facilities notable for colon  polyps.   - 01/27/2021: Colonoscopy: 4 small 2-5 mm adenomas/SSP's in the ascending colon, 12 mm transverse colon adenomas, 3 mm descending colon adenoma, pandiverticulosis, healthy appearing anastomosis in the rectum.  Repeat in 3 years   Has had 7 abdominal hernia surgeries in the past (inguinal and ventral hernia repairs).    Past Medical History:  Diagnosis Date   Allergy    Arthritis    Asthma    COPD (chronic obstructive pulmonary disease) (HCC)    boderline per pt   Diverticulitis    Hyperlipidemia    Hypertension    Myocardial infarction (HCC)    Sleep apnea    uses bipap   Sleep apnea in adult      Past Surgical History:  Procedure Laterality Date   COLON SURGERY     COLONOSCOPY     CORONARY STENT PLACEMENT     x2   FOOT SURGERY     JOINT REPLACEMENT     knee   LAPAROSCOPIC TRANSABDOMINAL HERNIA     MANDIBLE SURGERY     skin cancer     basil cell removed on back   UPPER GASTROINTESTINAL ENDOSCOPY     Family History  Adopted: Yes  Problem Relation Age of Onset   Uterine cancer Mother    Heart disease Mother    Hypertension Mother    Arthritis Mother    Alzheimer's disease Mother    Colon cancer Neg Hx    Pancreatic cancer Neg Hx    Esophageal cancer Neg Hx    Rectal cancer Neg Hx    Stomach cancer Neg Hx    Social History  Tobacco Use   Smoking status: Former    Types: Cigarettes   Smokeless tobacco: Never  Vaping Use   Vaping status: Never Used  Substance Use Topics   Alcohol use: Yes    Comment: soically    Drug use: Never   Current Outpatient Medications  Medication Sig Dispense Refill   albuterol  (VENTOLIN  HFA) 108 (90 Base) MCG/ACT inhaler Inhale 1-2 puffs into the lungs every 6 (six) hours as needed for wheezing or shortness of breath. 1 each 1   B Complex Vitamins (VITAMIN-B COMPLEX PO) Take 1 tablet by mouth daily.     Cholecalciferol 25 MCG (1000 UT) capsule Take 1,000 Units by mouth daily.     clindamycin  (CLEOCIN ) 150 MG capsule  Take 150 mg by mouth as needed (Dental procedure).     ezetimibe  (ZETIA ) 10 MG tablet Take 1 tablet (10 mg total) by mouth daily. 90 tablet 2   finasteride  (PROSCAR ) 5 MG tablet Take 1qd (Plz sched appt with new provider for future fills) (Patient taking differently: Take 5 mg by mouth daily. Take 1qd (Plz sched appt with new provider for future fills)) 90 tablet 1   fluticasone  furoate-vilanterol (BREO ELLIPTA ) 200-25 MCG/ACT AEPB INHALE ONE PUFF BY MOUTH ONE TIME DAILY 60 each 5   ipratropium (ATROVENT ) 0.03 % nasal spray Place 2 sprays into both nostrils every 12 (twelve) hours. 30 mL 6   lisinopril  (ZESTRIL ) 5 MG tablet Take 1 tablet (5 mg total) by mouth daily. 90 tablet 1   metoprolol  tartrate (LOPRESSOR ) 25 MG tablet Take 1 tablet (25 mg total) by mouth 2 (two) times daily. 180 tablet 1   montelukast  (SINGULAIR ) 10 MG tablet TAKE ONE TABLET BY MOUTH AT BEDTIME 90 tablet 3   Multiple Vitamins-Minerals (MULTIVITAMIN ADULTS 50+ PO) Take 1 tablet by mouth daily.     nitroGLYCERIN  (NITROSTAT ) 0.4 MG SL tablet Place 0.4 mg under the tongue every 5 (five) minutes as needed for chest pain.     omeprazole  (PRILOSEC ) 40 MG capsule Take 1 capsule (40 mg total) by mouth daily. 30 capsule 3   rosuvastatin  (CRESTOR ) 40 MG tablet Take 1 tablet (40 mg total) by mouth daily. 1rst attempt, patient needs and appt for additional refills 30 tablet 0   tamsulosin  (FLOMAX ) 0.4 MG CAPS capsule Take 1qd (Plz sched appt with new provider for future fills) (Patient taking differently: Take 0.4 mg by mouth daily after supper. Take 1qd (Plz sched appt with new provider for future fills)) 90 capsule 0   No current facility-administered medications for this visit.   Allergies  Allergen Reactions   Codeine Shortness Of Breath   Gramineae Pollens Itching   Penicillins Swelling     Review of Systems: All systems reviewed and negative except where noted in HPI.     Physical Exam:    Wt Readings from Last 3  Encounters:  01/29/24 202 lb 12.8 oz (92 kg)  11/12/23 209 lb 3.2 oz (94.9 kg)  10/01/23 216 lb 12.8 oz (98.3 kg)    BP 120/66   Pulse 62   Ht 6' (1.829 m)   Wt 202 lb 12.8 oz (92 kg)   BMI 27.50 kg/m  Constitutional:  Pleasant, in no acute distress. Psychiatric: Normal mood and affect. Behavior is normal. Cardiovascular: Normal rate, regular rhythm. No edema Pulmonary/chest: Effort normal and breath sounds normal. No wheezing, rales or rhonchi. Abdominal: Small ventral hernia without TTP.  Abdomen otherwise soft, nondistended, nontender. Bowel sounds active throughout. Neurological: Alert and  oriented to person place and time. Skin: Skin is warm and dry. No rashes noted.   ASSESSMENT AND PLAN;   1) Epigastric pain 2) Hematemesis 3) Melena Recent acute onset epigastric pain with blood in the mouth and subsequent melenic stool x 1-2 days.  He increased his Prilosec  to 40 mg twice daily.  No bleeding since then and pain much improved.  Discussed DDx with patient today to include PUD, erosive esophagitis, etc. with plan for the following: - Continue high-dose PPI for the time being - EGD for diagnostic and potentially therapeutic intent  4) History of colon polyps - Due for repeat colonoscopy for ongoing polyp surveillance - Schedule colonoscopy  5) Abnormal imaging Ultrasound last month with contracted gallbladder with cholelithiasis without definitive cholecystitis, along with course liver echotexture and possible nodular contours.  No ascites.  No prior history of esophageal varices.  Otherwise preserved hepatic synthetic function by labs. - Check PT/INR for completion of hepatic synthetic function testing - Evaluate for varices at time of EGD - If EGD unrevealing and continued abdominal pain, can consider HIDA scan - Depending on above findings, may consider cross-sectional imaging given the questionable nodular contours noted on ultrasound.  Otherwise normal PLT, liver  enzymes, albumin, bilirubin on recent labs  The indications, risks, and benefits of EGD and colonoscopy were explained to the patient in detail. Risks include but are not limited to bleeding, perforation, adverse reaction to medications, and cardiopulmonary compromise. Sequelae include but are not limited to the possibility of surgery, hospitalization, and mortality. The patient verbalized understanding and wished to proceed. All questions answered, referred to scheduler and bowel prep ordered. Further recommendations pending results of the exam.      Alex LULLA Flatter, DO, FACG  01/29/2024, 9:43 AM   Berneta Elsie Sayre,*

## 2024-02-19 ENCOUNTER — Encounter: Payer: Self-pay | Admitting: Gastroenterology

## 2024-02-19 ENCOUNTER — Ambulatory Visit (AMBULATORY_SURGERY_CENTER): Admitting: Gastroenterology

## 2024-02-19 VITALS — BP 126/78 | HR 72 | Temp 98.0°F | Resp 11 | Ht 72.0 in | Wt 202.0 lb

## 2024-02-19 DIAGNOSIS — Z1211 Encounter for screening for malignant neoplasm of colon: Secondary | ICD-10-CM | POA: Diagnosis present

## 2024-02-19 DIAGNOSIS — Z860101 Personal history of adenomatous and serrated colon polyps: Secondary | ICD-10-CM | POA: Diagnosis not present

## 2024-02-19 DIAGNOSIS — K921 Melena: Secondary | ICD-10-CM | POA: Diagnosis not present

## 2024-02-19 DIAGNOSIS — K2950 Unspecified chronic gastritis without bleeding: Secondary | ICD-10-CM | POA: Diagnosis not present

## 2024-02-19 DIAGNOSIS — K5730 Diverticulosis of large intestine without perforation or abscess without bleeding: Secondary | ICD-10-CM | POA: Diagnosis not present

## 2024-02-19 DIAGNOSIS — Z13810 Encounter for screening for upper gastrointestinal disorder: Secondary | ICD-10-CM | POA: Diagnosis not present

## 2024-02-19 DIAGNOSIS — K648 Other hemorrhoids: Secondary | ICD-10-CM

## 2024-02-19 DIAGNOSIS — Z9889 Other specified postprocedural states: Secondary | ICD-10-CM | POA: Diagnosis not present

## 2024-02-19 DIAGNOSIS — R1013 Epigastric pain: Secondary | ICD-10-CM

## 2024-02-19 DIAGNOSIS — D125 Benign neoplasm of sigmoid colon: Secondary | ICD-10-CM | POA: Diagnosis not present

## 2024-02-19 DIAGNOSIS — K2289 Other specified disease of esophagus: Secondary | ICD-10-CM

## 2024-02-19 DIAGNOSIS — K219 Gastro-esophageal reflux disease without esophagitis: Secondary | ICD-10-CM

## 2024-02-19 DIAGNOSIS — Z860100 Personal history of colon polyps, unspecified: Secondary | ICD-10-CM

## 2024-02-19 DIAGNOSIS — Z980 Intestinal bypass and anastomosis status: Secondary | ICD-10-CM | POA: Diagnosis not present

## 2024-02-19 DIAGNOSIS — K640 First degree hemorrhoids: Secondary | ICD-10-CM

## 2024-02-19 MED ORDER — OMEPRAZOLE 40 MG PO CPDR: 40.0000 mg | DELAYED_RELEASE_CAPSULE | Freq: Every day | ORAL | 5 refills | Status: DC

## 2024-02-19 MED ORDER — SODIUM CHLORIDE 0.9 % IV SOLN: 500.0000 mL | Freq: Once | INTRAVENOUS | Status: DC

## 2024-02-19 NOTE — Progress Notes (Signed)
 Approximately 1355, nasal cannula directed into mouth.

## 2024-02-19 NOTE — Op Note (Signed)
 [facility] Patient Name: Alex Boyle Procedure Date: 02/19/2024 1:16 PM MRN: [id number] Endoscopist: Sandor Flatter , MD, [id number] Age: 78 Referring MD:  Date of Birth: 01-02-1946 Gender: Male Account #: [id number] Procedure:                Upper GI endoscopy Indications:              Heartburn, Esophageal reflux, Melena, Surveillance                            for malignancy due to personal history of Barrett's                            esophagus Medicines:                Monitored Anesthesia Care Procedure:                Pre-Anesthesia Assessment:                           - Prior to the procedure, a History and Physical                            was performed, and patient medications and                            allergies were reviewed. The patient's tolerance of                            previous anesthesia was also reviewed. The risks                            and benefits of the procedure and the sedation                            options and risks were discussed with the patient.                            All questions were answered, and informed consent                            was obtained. Prior Anticoagulants: The patient has                            taken no anticoagulant or antiplatelet agents. ASA                            Grade Assessment: II - A patient with mild systemic                            disease. After reviewing the risks and benefits,                            the patient was deemed in satisfactory condition to  undergo the procedure.                           After obtaining informed consent, the endoscope was                            passed under direct vision. Throughout the                            procedure, the patient's blood pressure, pulse, and                            oxygen saturations were monitored continuously. The                            Olympus Scope [id number] was introduced  through the                            mouth, and advanced to the third part of duodenum.                            The upper GI endoscopy was accomplished without                            difficulty. The patient tolerated the procedure                            well. Scope In: Scope Out: Findings:                 The upper third of the esophagus, middle third of                            the esophagus and lower third of the esophagus were                            normal.                           The Z-line was irregular and was found 40 cm from                            the incisors. Biopsies were taken with a cold                            forceps for histology. Estimated blood loss was                            minimal.                           The entire examined stomach was normal. Biopsies                            were taken with a cold forceps for Helicobacter  pylori testing. Estimated blood loss was minimal.                           Evidence of a prior Nissen fundoplication was found                            in the cardia. This was characterized by healthy                            appearing mucosa and an intact appearance.                           The examined duodenum was normal. Complications:            No immediate complications. Estimated Blood Loss:     Estimated blood loss was minimal. Impression:               - Normal upper third of esophagus, middle third of                            esophagus and lower third of esophagus.                           - Z-line irregular, 40 cm from the incisors.                            Biopsied.                           - Normal stomach. Biopsied.                           - A Nissen fundoplication was found, characterized                            by an intact appearance and healthy appearing                            mucosa.                           - Normal examined  duodenum. Recommendation:           - Patient has a contact number available for                            emergencies. The signs and symptoms of potential                            delayed complications were discussed with the                            patient. Return to normal activities tomorrow.                            Written discharge instructions were provided to the  patient.                           - Resume previous diet.                           - Continue present medications.                           - Await pathology results. Sandor Flatter, MD 02/19/2024 2:24:58 PM

## 2024-02-19 NOTE — Progress Notes (Signed)
 Called to room to assist during endoscopic procedure.  Patient ID and intended procedure confirmed with present staff. Received instructions for my participation in the procedure from the performing physician.

## 2024-02-19 NOTE — Op Note (Signed)
 [facility] Patient Name: Alex Boyle Procedure Date: 02/19/2024 1:16 PM MRN: [id number] Endoscopist: Sandor Flatter , MD, [id number] Age: 78 Referring MD:  Date of Birth: March 04, 1946 Gender: Male Account #: [id number] Procedure:                Colonoscopy Indications:              Surveillance: Personal history of adenomatous                            polyps on last colonoscopy 3 years ago                           01/27/2021: Colonoscopy: 4 small 2-5 mm                            adenomas/SSP's in the ascending colon, 12 mm                            transverse colon adenomas, 3 mm descending colon                            adenoma, pandiverticulosis, healthy appearing                            anastomosis in the rectum. Medicines:                Monitored Anesthesia Care Procedure:                Pre-Anesthesia Assessment:                           - Prior to the procedure, a History and Physical                            was performed, and patient medications and                            allergies were reviewed. The patient's tolerance of                            previous anesthesia was also reviewed. The risks                            and benefits of the procedure and the sedation                            options and risks were discussed with the patient.                            All questions were answered, and informed consent                            was obtained. Prior Anticoagulants: The patient has  taken no anticoagulant or antiplatelet agents. ASA                            Grade Assessment: II - A patient with mild systemic                            disease. After reviewing the risks and benefits,                            the patient was deemed in satisfactory condition to                            undergo the procedure.                           After obtaining informed consent, the colonoscope                             was passed under direct vision. Throughout the                            procedure, the patient's blood pressure, pulse, and                            oxygen saturations were monitored continuously. The                            Olympus [id number] ([id number]) Colonoscope was                            introduced through the anus and advanced to the the                            cecum, identified by appendiceal orifice and                            ileocecal valve. The colonoscopy was performed                            without difficulty. The patient tolerated the                            procedure well. The quality of the bowel                            preparation was good. The ileocecal valve,                            appendiceal orifice, and rectum were photographed. Scope In: 1:54:09 PM Scope Out: 2:10:10 PM Scope Withdrawal Time: 0 hours 14 minutes 15 seconds  Total Procedure Duration: 0 hours 16 minutes 1 second  Findings:                 The perianal and digital rectal examinations were  normal.                           A 2 mm polyp was found in the sigmoid colon. The                            polyp was sessile. The polyp was removed with a                            cold snare. Resection and retrieval were complete.                            Estimated blood loss was minimal.                           Multiple small-mouthed diverticula were found in                            the sigmoid colon, descending colon, transverse                            colon and ascending colon.                           There was evidence of a prior end-to-end                            colo-colonic anastomosis in the recto-sigmoid                            colon. This was patent and was characterized by                            healthy appearing mucosa. The anastomosis was                            traversed.                           Non-bleeding  internal hemorrhoids were found during                            retroflexion. The hemorrhoids were small. Complications:            No immediate complications. Estimated Blood Loss:     Estimated blood loss was minimal. Impression:               - One 2 mm polyp in the sigmoid colon, removed with                            a cold snare. Resected and retrieved.                           - Diverticulosis in the sigmoid colon, in the  descending colon, in the transverse colon and in                            the ascending colon.                           - Patent end-to-end colo-colonic anastomosis,                            characterized by healthy appearing mucosa.                           - Non-bleeding internal hemorrhoids. Recommendation:           - Patient has a contact number available for                            emergencies. The signs and symptoms of potential                            delayed complications were discussed with the                            patient. Return to normal activities tomorrow.                            Written discharge instructions were provided to the                            patient.                           - Resume previous diet.                           - Continue present medications.                           - Await pathology results.                           - Repeat colonoscopy for surveillance based on                            pathology results.                           - Return to GI clinic PRN. Sandor Flatter, MD 02/19/2024 2:29:17 PM

## 2024-02-19 NOTE — Progress Notes (Signed)
 Pt's states no medical or surgical changes since previsit or office visit.

## 2024-02-19 NOTE — Progress Notes (Signed)
 GASTROENTEROLOGY PROCEDURE H&P NOTE   Primary Care Physician: Berneta Elsie Sayre, MD    Reason for Procedure:  GERD, regurgitation, heartburn, Barrett's use, epigastric pain, melena, history of colon polyps  Plan:    EGD, colonoscopy  Patient is appropriate for endoscopic procedure(s) in the ambulatory ([facility]) setting.  The nature of the procedure, as well as the risks, benefits, and alternatives were carefully and thoroughly reviewed with the patient. Ample time for discussion and questions allowed. The patient understood, was satisfied, and agreed to proceed.     HPI: Alex Boyle is a 78 y.o. male who presents for EGD for evaluation of epigastric pain, hematemesis, melena, GERD with breakthrough reflux symptoms, Barrett's Esophagus surveillance along with colonoscopy for ongoing polyp surveillance.  Patient was most recently seen in the Gastroenterology Clinic on 01/29/2024 by me.  No interval change in medical history since that appointment. Please refer to that note for full details regarding GI history and clinical presentation.   Past Medical History:  Diagnosis Date   Allergy    Arthritis    Asthma    COPD (chronic obstructive pulmonary disease) (HCC)    boderline per pt   Diverticulitis    Hyperlipidemia    Hypertension    Myocardial infarction (HCC)    Sleep apnea    uses bipap   Sleep apnea in adult     Past Surgical History:  Procedure Laterality Date   COLON SURGERY     COLONOSCOPY     CORONARY STENT PLACEMENT     x2   FOOT SURGERY     JOINT REPLACEMENT     knee   LAPAROSCOPIC TRANSABDOMINAL HERNIA     MANDIBLE SURGERY     skin cancer     basil cell removed on back   UPPER GASTROINTESTINAL ENDOSCOPY      Prior to Admission medications   Medication Sig Start Date End Date Taking? Authorizing Provider  albuterol  (VENTOLIN  HFA) 108 (90 Base) MCG/ACT inhaler Inhale 1-2 puffs into the lungs every 6 (six) hours as needed for wheezing or  shortness of breath. 06/06/21   Berneta Elsie Sayre, MD  B Complex Vitamins (VITAMIN-B COMPLEX PO) Take 1 tablet by mouth daily.    [provider]  Cholecalciferol 25 MCG (1000 UT) capsule Take 1,000 Units by mouth daily.    [provider]  clindamycin  (CLEOCIN ) 150 MG capsule Take 150 mg by mouth as needed (Dental procedure). 10/12/20   [provider]  ezetimibe  (ZETIA ) 10 MG tablet Take 1 tablet (10 mg total) by mouth daily. 10/08/23   Krasowski, Robert J, MD  finasteride  (PROSCAR ) 5 MG tablet Take 1qd (Plz sched appt with new provider for future fills) Patient taking differently: Take 5 mg by mouth daily. Take 1qd (Plz sched appt with new provider for future fills) 09/23/19   Berneta Elsie Sayre, MD  fluticasone  furoate-vilanterol (BREO ELLIPTA ) 200-25 MCG/ACT AEPB INHALE ONE PUFF BY MOUTH ONE TIME DAILY 07/09/23   Neda Hammond A, MD  ipratropium (ATROVENT ) 0.03 % nasal spray Place 2 sprays into both nostrils every 12 (twelve) hours. 07/13/21   Neda Hammond LABOR, MD  lisinopril  (ZESTRIL ) 5 MG tablet Take 1 tablet (5 mg total) by mouth daily. 01/20/24   Krasowski, Robert J, MD  metoprolol  tartrate (LOPRESSOR ) 25 MG tablet Take 1 tablet (25 mg total) by mouth 2 (two) times daily. 01/20/24   Krasowski, Robert J, MD  montelukast  (SINGULAIR ) 10 MG tablet TAKE ONE TABLET BY MOUTH AT BEDTIME  05/10/23   Neda Jennet LABOR, MD  Multiple Vitamins-Minerals (MULTIVITAMIN ADULTS 50+ PO) Take 1 tablet by mouth daily.    [provider]  nitroGLYCERIN  (NITROSTAT ) 0.4 MG SL tablet Place 0.4 mg under the tongue every 5 (five) minutes as needed for chest pain.    [provider]  omeprazole  (PRILOSEC ) 40 MG capsule Take 1 capsule (40 mg total) by mouth daily. 11/12/23   Berneta Elsie Sayre, MD  rosuvastatin  (CRESTOR ) 40 MG tablet Take 1 tablet (40 mg total) by mouth daily. 1rst attempt, patient needs and appt for additional refills 04/26/23   Krasowski, Robert J, MD   tamsulosin  (FLOMAX ) 0.4 MG CAPS capsule Take 1qd (Plz sched appt with new provider for future fills) Patient taking differently: Take 0.4 mg by mouth daily after supper. Take 1qd (Plz sched appt with new provider for future fills) 09/23/19   Berneta Elsie Sayre, MD    Current Outpatient Medications  Medication Sig Dispense Refill   albuterol  (VENTOLIN  HFA) 108 (90 Base) MCG/ACT inhaler Inhale 1-2 puffs into the lungs every 6 (six) hours as needed for wheezing or shortness of breath. 1 each 1   B Complex Vitamins (VITAMIN-B COMPLEX PO) Take 1 tablet by mouth daily.     Cholecalciferol 25 MCG (1000 UT) capsule Take 1,000 Units by mouth daily.     clindamycin  (CLEOCIN ) 150 MG capsule Take 150 mg by mouth as needed (Dental procedure).     ezetimibe  (ZETIA ) 10 MG tablet Take 1 tablet (10 mg total) by mouth daily. 90 tablet 2   finasteride  (PROSCAR ) 5 MG tablet Take 1qd (Plz sched appt with new provider for future fills) (Patient taking differently: Take 5 mg by mouth daily. Take 1qd (Plz sched appt with new provider for future fills)) 90 tablet 1   fluticasone  furoate-vilanterol (BREO ELLIPTA ) 200-25 MCG/ACT AEPB INHALE ONE PUFF BY MOUTH ONE TIME DAILY 60 each 5   ipratropium (ATROVENT ) 0.03 % nasal spray Place 2 sprays into both nostrils every 12 (twelve) hours. 30 mL 6   lisinopril  (ZESTRIL ) 5 MG tablet Take 1 tablet (5 mg total) by mouth daily. 90 tablet 1   metoprolol  tartrate (LOPRESSOR ) 25 MG tablet Take 1 tablet (25 mg total) by mouth 2 (two) times daily. 180 tablet 1   montelukast  (SINGULAIR ) 10 MG tablet TAKE ONE TABLET BY MOUTH AT BEDTIME 90 tablet 3   Multiple Vitamins-Minerals (MULTIVITAMIN ADULTS 50+ PO) Take 1 tablet by mouth daily.     nitroGLYCERIN  (NITROSTAT ) 0.4 MG SL tablet Place 0.4 mg under the tongue every 5 (five) minutes as needed for chest pain.     omeprazole  (PRILOSEC ) 40 MG capsule Take 1 capsule (40 mg total) by mouth daily. 30 capsule 3   rosuvastatin  (CRESTOR ) 40 MG  tablet Take 1 tablet (40 mg total) by mouth daily. 1rst attempt, patient needs and appt for additional refills 30 tablet 0   tamsulosin  (FLOMAX ) 0.4 MG CAPS capsule Take 1qd (Plz sched appt with new provider for future fills) (Patient taking differently: Take 0.4 mg by mouth daily after supper. Take 1qd (Plz sched appt with new provider for future fills)) 90 capsule 0   No current facility-administered medications for this visit.    Allergies as of 02/19/2024 - Review Complete 01/29/2024  Allergen Reaction Noted   Codeine Shortness Of Breath 07/02/2018   Gramineae pollens Itching 10/26/2020   Penicillins Swelling 07/02/2018    Family History  Adopted: Yes  Problem Relation Age of Onset   Uterine cancer  Mother    Heart disease Mother    Hypertension Mother    Arthritis Mother    Alzheimer's disease Mother    Colon cancer Neg Hx    Pancreatic cancer Neg Hx    Esophageal cancer Neg Hx    Rectal cancer Neg Hx    Stomach cancer Neg Hx     Social History   Socioeconomic History   Marital status: Married    Spouse name: Not on file   Number of children: 1   Years of education: Not on file   Highest education level: Some college, no degree  Occupational History   Not on file  Tobacco Use   Smoking status: Former    Types: Cigarettes   Smokeless tobacco: Never  Vaping Use   Vaping status: Never Used  Substance and Sexual Activity   Alcohol use: Yes    Comment: soically    Drug use: Never   Sexual activity: Yes    Birth control/protection: None  Other Topics Concern   Not on file  Social History Narrative   Not on file   Social Drivers of Health   Financial Resource Strain: Low Risk  (09/30/2023)   Overall Financial Resource Strain (CARDIA)    Difficulty of Paying Living Expenses: Not hard at all  Food Insecurity: No Food Insecurity (09/30/2023)   Hunger Vital Sign    Worried About Running Out of Food in the Last Year: Never true    Ran Out of Food in the Last Year:  Never true  Transportation Needs: No Transportation Needs (09/30/2023)   PRAPARE - Administrator, Civil Service (Medical): No    Lack of Transportation (Non-Medical): No  Physical Activity: Unknown (09/30/2023)   Exercise Vital Sign    Days of Exercise per Week: 0 days    Minutes of Exercise per Session: Not on file  Stress: No Stress Concern Present (09/30/2023)   [employer] of Occupational Health - Occupational Stress Questionnaire    Feeling of Stress : Not at all  Social Connections: Socially Integrated (09/30/2023)   Social Connection and Isolation Panel    Frequency of Communication with Friends and Family: More than three times a week    Frequency of Social Gatherings with Friends and Family: Twice a week    Attends Religious Services: More than 4 times per year    Active Member of [employer] or Organizations: Yes    Attends Banker Meetings: More than 4 times per year    Marital Status: Married  Catering manager Violence: Unknown (04/04/2023)   Received from [facility]   HITS    Physically Hurt: Not on file    Insult or Talk Down To: Not on file    Threaten Physical Harm: Not on file    Scream or Curse: Not on file    Physical Exam: Vital signs in last 24 hours: @There  were no vitals taken for this visit. GEN: NAD EYE: Sclerae anicteric ENT: MMM CV: Non-tachycardic Pulm: CTA b/l GI: Soft, NT/ND NEURO:  Alert & Oriented x 3   Sandor Flatter, DO [facility]   02/19/2024 12:57 PM

## 2024-02-19 NOTE — Progress Notes (Signed)
 Transferred to PACU via stretcher, arousing, VSS.

## 2024-02-19 NOTE — Patient Instructions (Addendum)
 Await pathology results. Repeat colonoscopy for surveillance based on pathology results. Return to GI clinic as needed  Please read over handout provided  YOU HAD AN ENDOSCOPIC PROCEDURE TODAY AT THE [facility]:   Refer to the procedure report that was given to you for any specific questions about what was found during the examination.  If the procedure report does not answer your questions, please call your gastroenterologist to clarify.  If you requested that your care partner not be given the details of your procedure findings, then the procedure report has been included in a sealed envelope for you to review at your convenience later.  YOU SHOULD EXPECT: Some feelings of bloating in the abdomen. Passage of more gas than usual.  Walking can help get rid of the air that was put into your GI tract during the procedure and reduce the bloating. If you had a lower endoscopy (such as a colonoscopy or flexible sigmoidoscopy) you may notice spotting of blood in your stool or on the toilet paper. If you underwent a bowel prep for your procedure, you may not have a normal bowel movement for a few days.  Please Note:  You might notice some irritation and congestion in your nose or some drainage.  This is from the oxygen used during your procedure.  There is no need for concern and it should clear up in a day or so.  SYMPTOMS TO REPORT IMMEDIATELY:  Following lower endoscopy (colonoscopy or flexible sigmoidoscopy):  Excessive amounts of blood in the stool  Significant tenderness or worsening of abdominal pains  Swelling of the abdomen that is new, acute  Fever of 100F or higher  Following upper endoscopy (EGD)  Vomiting of blood or coffee ground material  New chest pain or pain under the shoulder blades  Painful or persistently difficult swallowing  New shortness of breath  Fever of 100F or higher  Black, tarry-looking stools  For urgent or emergent issues, a gastroenterologist can  be reached at any hour by calling [phone]. Do not use MyChart messaging for urgent concerns.    DIET:  We do recommend a small meal at first, but then you may proceed to your regular diet.  Drink plenty of fluids but you should avoid alcoholic beverages for 24 hours.  ACTIVITY:  You should plan to take it easy for the rest of today and you should NOT DRIVE or use heavy machinery until tomorrow (because of the sedation medicines used during the test).    FOLLOW UP: Our staff will call the number listed on your records the next business day following your procedure.  We will call around 7:15- 8:00 am to check on you and address any questions or concerns that you may have regarding the information given to you following your procedure. If we do not reach you, we will leave a message.     If any biopsies were taken you will be contacted by phone or by letter within the next 1-3 weeks.  Please call us  at [phone] if you have not heard about the biopsies in 3 weeks.    SIGNATURES/CONFIDENTIALITY: You and/or your care partner have signed paperwork which will be entered into your electronic medical record.  These signatures attest to the fact that that the information above on your After Visit Summary has been reviewed and is understood.  Full responsibility of the confidentiality of this discharge information lies with you and/or your care-partner.

## 2024-02-20 ENCOUNTER — Telehealth: Payer: Self-pay | Admitting: *Deleted

## 2024-02-20 NOTE — Telephone Encounter (Signed)
  Follow up Call-     02/19/2024    1:19 PM  Call back number  Post procedure Call Back phone  # [phone]  Permission to leave phone message Yes    Post procedure follow up phone call. No answer at number given.  Left message on voicemail.

## 2024-02-24 LAB — SURGICAL PATHOLOGY

## 2024-02-25 ENCOUNTER — Ambulatory Visit: Payer: Self-pay | Admitting: Gastroenterology

## 2024-03-06 ENCOUNTER — Telehealth: Payer: Self-pay

## 2024-03-06 NOTE — Telephone Encounter (Signed)
 Please advise on patient's request to transfer care.   Thank you!

## 2024-03-06 NOTE — Telephone Encounter (Signed)
 Copied from CRM #[id number]. Topic: Appointments - Transfer of Care >> Mar 06, 2024  1:40 PM Alex Boyle wrote: Pt is requesting to transfer FROM: [name] Pt is requesting to transfer TO: [name] Reason for requested transfer: Close to his home.  It is the responsibility of the team the patient would like to transfer to (Dr. [name]) to reach out to the patient if for any reason this transfer is not acceptable.

## 2024-03-20 ENCOUNTER — Other Ambulatory Visit: Payer: Self-pay | Admitting: Pulmonary Disease

## 2024-04-22 ENCOUNTER — Encounter (INDEPENDENT_AMBULATORY_CARE_PROVIDER_SITE_OTHER): Payer: Self-pay

## 2024-04-22 ENCOUNTER — Ambulatory Visit (INDEPENDENT_AMBULATORY_CARE_PROVIDER_SITE_OTHER): Admitting: Urgent Care

## 2024-04-22 ENCOUNTER — Ambulatory Visit (INDEPENDENT_AMBULATORY_CARE_PROVIDER_SITE_OTHER)

## 2024-04-22 ENCOUNTER — Encounter: Payer: Self-pay | Admitting: Urgent Care

## 2024-04-22 VITALS — BP 117/61 | HR 64 | Ht 72.0 in | Wt 210.0 lb

## 2024-04-22 DIAGNOSIS — R634 Abnormal weight loss: Secondary | ICD-10-CM | POA: Diagnosis not present

## 2024-04-22 DIAGNOSIS — Z23 Encounter for immunization: Secondary | ICD-10-CM | POA: Diagnosis not present

## 2024-04-22 DIAGNOSIS — M19041 Primary osteoarthritis, right hand: Secondary | ICD-10-CM | POA: Diagnosis not present

## 2024-04-22 DIAGNOSIS — J4540 Moderate persistent asthma, uncomplicated: Secondary | ICD-10-CM

## 2024-04-22 DIAGNOSIS — I2510 Atherosclerotic heart disease of native coronary artery without angina pectoris: Secondary | ICD-10-CM | POA: Diagnosis not present

## 2024-04-22 DIAGNOSIS — E7800 Pure hypercholesterolemia, unspecified: Secondary | ICD-10-CM

## 2024-04-22 DIAGNOSIS — G4733 Obstructive sleep apnea (adult) (pediatric): Secondary | ICD-10-CM | POA: Diagnosis not present

## 2024-04-22 DIAGNOSIS — Z79899 Other long term (current) drug therapy: Secondary | ICD-10-CM

## 2024-04-22 DIAGNOSIS — C4491 Basal cell carcinoma of skin, unspecified: Secondary | ICD-10-CM | POA: Insufficient documentation

## 2024-04-22 DIAGNOSIS — N401 Enlarged prostate with lower urinary tract symptoms: Secondary | ICD-10-CM

## 2024-04-22 DIAGNOSIS — M19042 Primary osteoarthritis, left hand: Secondary | ICD-10-CM

## 2024-04-22 DIAGNOSIS — Z87442 Personal history of urinary calculi: Secondary | ICD-10-CM | POA: Insufficient documentation

## 2024-04-22 DIAGNOSIS — I10 Essential (primary) hypertension: Secondary | ICD-10-CM

## 2024-04-22 DIAGNOSIS — K2100 Gastro-esophageal reflux disease with esophagitis, without bleeding: Secondary | ICD-10-CM

## 2024-04-22 DIAGNOSIS — R7303 Prediabetes: Secondary | ICD-10-CM

## 2024-04-22 DIAGNOSIS — K22719 Barrett's esophagus with dysplasia, unspecified: Secondary | ICD-10-CM

## 2024-04-22 DIAGNOSIS — Z85828 Personal history of other malignant neoplasm of skin: Secondary | ICD-10-CM

## 2024-04-22 NOTE — Patient Instructions (Signed)
 We drew labs today for screening. We updated your flu and pneumonia vaccine.  Please return in several weeks for a nurse visit to obtain your shingles vaccine.  Please go to [address] to obtain an Xray of your right hand.  Please schedule a follow up visit with me in 4 months, sooner depending on lab results. Please ensure that all specialists send me copies of their consult notes and workups.

## 2024-04-22 NOTE — Progress Notes (Signed)
 New Patient Office Visit  Subjective:  Patient ID: Alex Boyle, male    DOB: March 03, 1946  Age: 78 y.o. MRN: [id number]  CC:  Chief Complaint  Patient presents with   Establish Care    HPI Alex Boyle presents to establish care.  Discussed the use of AI scribe software for clinical note transcription with the patient, who gave verbal consent to proceed.  History of Present Illness   Alex Boyle is a 78 year old male with asthma, COPD, and sleep apnea who presents for a new patient visit and medication review.  He has a history of asthma and COPD, managed with albuterol  as needed, Breo Ellipta  200 mcg daily, and montelukast  at night. The increase in Breo Ellipta  dosage from 100 mcg to 200 mcg has improved his symptoms. He also uses Atrovent  nasal spray. He experiences difficulty with allergens, particularly since moving from [facility] to [place] in 2020. He does follow with a pulmonologist.   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 He takes over-the-counter vitamin D. He is scheduled for a dermatology follow-up at the end of December.  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 He is on Zetia  and Crestor  for cholesterol, and metoprolol  and lisinopril  for blood pressure. He carries nitroglycerin  but has never needed to use it. He quit smoking after a brief period in college.  He has Barrett's esophagus and takes omeprazole  40 mg daily, reduced from twice daily. He follows dietary and lifestyle modifications to manage his condition, including sleeping inclined and avoiding certain foods. He had an endoscopy two months ago showing improvement.  He has arthritis, particularly affecting his hands and feet. He underwent surgery on his foot in 2021, which revealed severe arthritis leading to fusion of three toes. He experiences pain and deformity in his  hands, with more pain in the right hand but more visible deterioration in the left. He takes fish oil and turmeric for joint health.  He has benign prostatic hyperplasia (BPH) and passes kidney stones frequently. He is on finasteride  and tamsulosin  for BPH.  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 He manages this with Prilosec  and lifestyle adjustments.  He is actively losing weight, having reduced from 225 lbs to 210 lbs since the beginning of the year, through dietary changes and increased physical activity. He aims to reach 180 lbs to potentially reduce medication needs.       Outpatient Encounter Medications as of 04/22/2024  Medication Sig   albuterol  (VENTOLIN  HFA) 108 (90 Base) MCG/ACT inhaler Inhale 1-2 puffs into the lungs every 6 (six) hours as needed for wheezing or shortness of breath.   B Complex Vitamins (VITAMIN-B COMPLEX PO) Take 1 tablet by mouth daily.   Cholecalciferol 25 MCG (1000 UT) capsule Take 1,000 Units by mouth daily.   clindamycin  (CLEOCIN ) 150 MG capsule Take 150 mg by mouth as needed (Dental procedure). (Patient taking differently: Take 150 mg by mouth as needed (Dental procedure).)   ezetimibe  (ZETIA ) 10 MG tablet Take 1 tablet (10 mg total) by mouth daily.   finasteride  (PROSCAR ) 5 MG tablet Take 1qd (Plz sched appt with new provider for future fills) (Patient taking differently: Take 5 mg by mouth daily. Take 1qd (Plz sched appt with new provider for future fills))   fluticasone  furoate-vilanterol (BREO ELLIPTA )  200-25 MCG/ACT AEPB INHALE ONE PUFF BY MOUTH ONE TIME DAILY   ipratropium (ATROVENT ) 0.03 % nasal spray Place 2 sprays into both nostrils every 12 (twelve) hours.   lisinopril  (ZESTRIL ) 5 MG tablet Take 1 tablet (5 mg total) by mouth daily.   metoprolol  tartrate (LOPRESSOR ) 25 MG tablet Take 1 tablet (25 mg total) by mouth 2 (two) times daily.   montelukast  (SINGULAIR ) 10 MG tablet  TAKE ONE TABLET BY MOUTH AT BEDTIME   Multiple Vitamins-Minerals (MULTIVITAMIN ADULTS 50+ PO) Take 1 tablet by mouth daily.   nitroGLYCERIN  (NITROSTAT ) 0.4 MG SL tablet Place 0.4 mg under the tongue every 5 (five) minutes as needed for chest pain.   omeprazole  (PRILOSEC ) 40 MG capsule Take 1 capsule (40 mg total) by mouth daily.   rosuvastatin  (CRESTOR ) 40 MG tablet Take 1 tablet (40 mg total) by mouth daily. 1rst attempt, patient needs and appt for additional refills   tamsulosin  (FLOMAX ) 0.4 MG CAPS capsule Take 1qd (Plz sched appt with new provider for future fills) (Patient taking differently: Take 0.4 mg by mouth daily after supper. Take 1qd (Plz sched appt with new provider for future fills))   [DISCONTINUED] omeprazole  (PRILOSEC ) 40 MG capsule Take 1 capsule (40 mg total) by mouth daily.   No facility-administered encounter medications on file as of 04/22/2024.    Past Medical History:  Diagnosis Date   Allergy    Arthritis    Asthma    COPD (chronic obstructive pulmonary disease) (HCC)    boderline per pt   Diverticulitis    Hyperlipidemia    Hypertension    Myocardial infarction (HCC)    Sleep apnea    uses bipap   Sleep apnea in adult     Past Surgical History:  Procedure Laterality Date   COLON SURGERY     COLONOSCOPY     CORONARY STENT PLACEMENT     x2   FOOT SURGERY     JOINT REPLACEMENT     knee   LAPAROSCOPIC TRANSABDOMINAL HERNIA     MANDIBLE SURGERY     skin cancer     basil cell removed on back   UPPER GASTROINTESTINAL ENDOSCOPY      Family History  Adopted: Yes  Problem Relation Age of Onset   Uterine cancer Mother    Heart disease Mother    Hypertension Mother    Arthritis Mother    Alzheimer's disease Mother    Colon cancer Neg Hx    Pancreatic cancer Neg Hx    Esophageal cancer Neg Hx    Rectal cancer Neg Hx    Stomach cancer Neg Hx     Social History   Socioeconomic History   Marital status: Married    Spouse name: Not on file    Number of children: 1   Years of education: Not on file   Highest education level: Some college, no degree  Occupational History   Not on file  Tobacco Use   Smoking status: Former    Types: Cigarettes   Smokeless tobacco: Never  Vaping Use   Vaping status: Never Used  Substance and Sexual Activity   Alcohol use: Yes    Comment: soically    Drug use: Never   Sexual activity: Yes    Birth control/protection: None  Other Topics Concern   Not on file  Social History Narrative   Not on file   Social Drivers of Health   Financial Resource Strain: Low Risk  (04/18/2024)  Overall Financial Resource Strain (CARDIA)    Difficulty of Paying Living Expenses: Not hard at all  Food Insecurity: No Food Insecurity (04/18/2024)   Hunger Vital Sign    Worried About Running Out of Food in the Last Year: Never true    Ran Out of Food in the Last Year: Never true  Transportation Needs: No Transportation Needs (04/18/2024)   PRAPARE - Administrator, Civil Service (Medical): No    Lack of Transportation (Non-Medical): No  Physical Activity: Insufficiently Active (04/18/2024)   Exercise Vital Sign    Days of Exercise per Week: 3 days    Minutes of Exercise per Session: 20 min  Stress: No Stress Concern Present (04/18/2024)   [employer] of Occupational Health - Occupational Stress Questionnaire    Feeling of Stress: Not at all  Social Connections: Moderately Integrated (04/18/2024)   Social Connection and Isolation Panel    Frequency of Communication with Friends and Family: Once a week    Frequency of Social Gatherings with Friends and Family: Once a week    Attends Religious Services: More than 4 times per year    Active Member of [employer] or Organizations: Yes    Attends Banker Meetings: More than 4 times per year    Marital Status: Married  Catering Manager Violence: Unknown (04/04/2023)   Received from [facility]   HITS    Physically Hurt: Not on file     Insult or Talk Down To: Not on file    Threaten Physical Harm: Not on file    Scream or Curse: Not on file    ROS: as noted in HPI  Objective:  BP 117/61   Pulse 64   Ht 6' (1.829 m)   Wt 210 lb (95.3 kg)   SpO2 95%   BMI 28.48 kg/m   Physical Exam Vitals and nursing note reviewed.  Constitutional:      General: He is not in acute distress.    Appearance: Normal appearance. He is not ill-appearing, toxic-appearing or diaphoretic.  HENT:     Head: Normocephalic and atraumatic.     Right Ear: Tympanic membrane, ear canal and external ear normal. There is no impacted cerumen.     Left Ear: Tympanic membrane, ear canal and external ear normal. There is no impacted cerumen.     Nose: Nose normal.     Mouth/Throat:     Mouth: Mucous membranes are moist.     Pharynx: Oropharynx is clear. No oropharyngeal exudate or posterior oropharyngeal erythema.  Eyes:     General: No scleral icterus.       Right eye: No discharge.        Left eye: No discharge.     Extraocular Movements: Extraocular movements intact.     Pupils: Pupils are equal, round, and reactive to light.  Neck:     Thyroid : No thyroid  mass, thyromegaly or thyroid  tenderness.  Cardiovascular:     Rate and Rhythm: Normal rate and regular rhythm.     Pulses: Normal pulses.     Heart sounds: No murmur heard. Pulmonary:     Effort: Pulmonary effort is normal. No respiratory distress.     Breath sounds: Normal breath sounds. No stridor. No wheezing or rhonchi.  Abdominal:     General: Abdomen is flat. Bowel sounds are normal. There is no distension.     Palpations: Abdomen is soft. There is no mass.     Tenderness: There is no  abdominal tenderness. There is no guarding.     Comments: Large diastasis recti Abdominal scar  Musculoskeletal:        General: Deformity (OA nodules DIP joints bilateral hands) present.     Cervical back: Normal range of motion and neck supple. No rigidity or tenderness.     Right lower leg:  No edema.     Left lower leg: No edema.  Lymphadenopathy:     Cervical: No cervical adenopathy.  Skin:    General: Skin is warm and dry.     Coloration: Skin is not jaundiced.     Findings: No bruising, erythema or rash.  Neurological:     General: No focal deficit present.     Mental Status: He is alert and oriented to person, place, and time.     Sensory: No sensory deficit.     Motor: No weakness.  Psychiatric:        Mood and Affect: Mood normal.        Behavior: Behavior normal.     Last CBC Lab Results  Component Value Date   WBC 7.0 12/27/2023   HGB 15.8 12/27/2023   HCT 46.7 12/27/2023   MCV 92.3 12/27/2023   MCH 30.3 07/16/2022   RDW 13.5 12/27/2023   PLT 192.0 12/27/2023   Last metabolic panel Lab Results  Component Value Date   GLUCOSE 84 12/27/2023   NA 139 12/27/2023   K 4.0 12/27/2023   CL 100 12/27/2023   CO2 32 12/27/2023   BUN 9 12/27/2023   CREATININE 0.70 12/27/2023   GFR 89.04 12/27/2023   CALCIUM  9.7 12/27/2023   PROT 7.2 12/27/2023   ALBUMIN 4.5 12/27/2023   BILITOT 0.8 12/27/2023   ALKPHOS 45 12/27/2023   AST 22 12/27/2023   ALT 23 12/27/2023   ANIONGAP 7 07/16/2022   Last lipids Lab Results  Component Value Date   CHOL 127 08/29/2023   HDL 39 (L) 08/29/2023   LDLCALC 63 08/29/2023   LDLDIRECT 74.0 02/14/2021   TRIG 146 08/29/2023   CHOLHDL 3.3 08/29/2023   Last hemoglobin A1c Lab Results  Component Value Date   HGBA1C 5.9 11/15/2020   Last thyroid  functions Lab Results  Component Value Date   TSH 2.34 11/15/2020   Last vitamin D No results found for: 25OHVITD2, 25OHVITD3, VD25OH Last vitamin B12 and Folate Lab Results  Component Value Date   VITAMINB12 591 11/15/2020      Assessment & Plan:  Essential hypertension -     CBC with Differential/Platelet -     TSH -     Comprehensive metabolic panel with GFR  History of basal cell cancer  Coronary artery disease involving native coronary artery of native  heart without angina pectoris -     CBC with Differential/Platelet -     Lipid panel -     Comprehensive metabolic panel with GFR  Immunization due -     Flu vaccine HIGH DOSE PF(Fluzone Trivalent) -     Pneumococcal conjugate vaccine 20-valent  Moderate persistent asthma without complication  Barrett's esophagus with dysplasia  Gastroesophageal reflux disease with esophagitis without hemorrhage  OSA (obstructive sleep apnea)  Weight loss -     TSH  Prediabetes -     Hemoglobin A1c  Benign prostatic hyperplasia with lower urinary tract symptoms, symptom details unspecified  History of kidney stones  Arthritis of both hands -     DG Hand Complete Right; Future -     Rheumatoid factor -  CYCLIC CITRUL PEPTIDE ANTIBODY, IGG/IGA -     Sedimentation rate  Elevated LDL cholesterol level -     Lipid panel  Long-term use of high-risk medication -     CBC with Differential/Platelet -     TSH -     Comprehensive metabolic panel with GFR -     A87 and Folate Panel  Assessment and Plan    Basal cell carcinoma, multiple sites Multiple basal cell carcinomas with 15 surgeries. Advised against sun exposure. - Continue annual dermatology follow-up.  Atherosclerotic heart disease, status post stents Status post two stents in 2012 and 2015. No chest pain or nitroglycerin  use since. - Continue current cardiac medications.  Essential hypertension Hypertension managed with metoprolol  and lisinopril . - Continue current antihypertensive regimen.  Moderate persistent asthma and chronic obstructive pulmonary disease Asthma and COPD managed with albuterol , Breo Ellipta , montelukast , and Atrovent . Breo Ellipta  dose increased with symptom improvement. - Continue current asthma and COPD medications.  Obstructive sleep apnea  Barrett's esophagus with dysplasia and gastroesophageal reflux disease with esophagitis Barrett's esophagus with dysplasia and GERD managed with omeprazole .  Recent endoscopy showed improvement. Discussed Voquezna as alternative. - Continue omeprazole  40 mg once daily. - Monitor B12 levels and kidney function. - Consider Voquezna if omeprazole  is ineffective or if B12/kidney function changes.  Benign prostatic hyperplasia with lower urinary tract symptoms and bladder stones (planned surgery) BPH with bladder stones. Scheduled for TURP and bladder stone removal. PSA monitored by urologist. - Proceed with scheduled TURP and bladder stone removal.  Nephrolithiasis (kidney stones) Frequent kidney stones. Scheduled for bladder stone removal during TURP. - Proceed with scheduled bladder stone removal.  Primary osteoarthritis of hands and feet Osteoarthritis with joint pain and deformity. Previous foot surgery. Prefers to avoid additional medications. - Ordered x-ray of hands to assess arthritis severity. - Continue fish oil and turmeric supplementation.  Pure hypercholesterolemia Managed with Zetia  and Crestor . - Continue current lipid-lowering therapy.  Prediabetes Managed with lifestyle modifications. - Continue lifestyle modifications for weight management.  Intentional weight loss Actively losing weight through diet and exercise. Current weight 210 lbs, goal 180 lbs. - Continue current weight loss efforts.  General Health Maintenance Due for flu and pneumococcal vaccines. Shingles vaccine recommended. - Administered flu and pneumococcal vaccines today. - Plan for shingles vaccine series in the future.        Return in about 4 months (around 08/20/2024).   Benton LITTIE Gave, PA

## 2024-04-23 ENCOUNTER — Ambulatory Visit: Payer: Self-pay | Admitting: Urgent Care

## 2024-04-23 DIAGNOSIS — M19041 Primary osteoarthritis, right hand: Secondary | ICD-10-CM

## 2024-04-25 LAB — COMPREHENSIVE METABOLIC PANEL WITH GFR
ALT: 21 IU/L (ref 0–44)
AST: 18 IU/L (ref 0–40)
Albumin: 4.5 g/dL (ref 3.8–4.8)
Alkaline Phosphatase: 54 IU/L (ref 47–123)
BUN/Creatinine Ratio: 16 (ref 10–24)
BUN: 13 mg/dL (ref 8–27)
Bilirubin Total: 0.8 mg/dL (ref 0.0–1.2)
CO2: 26 mmol/L (ref 20–29)
Calcium: 9.5 mg/dL (ref 8.6–10.2)
Chloride: 101 mmol/L (ref 96–106)
Creatinine, Ser: 0.81 mg/dL (ref 0.76–1.27)
Globulin, Total: 2.2 g/dL (ref 1.5–4.5)
Glucose: 101 mg/dL — ABNORMAL HIGH (ref 70–99)
Potassium: 4.3 mmol/L (ref 3.5–5.2)
Sodium: 140 mmol/L (ref 134–144)
Total Protein: 6.7 g/dL (ref 6.0–8.5)
eGFR: 91 mL/min/1.73 (ref >59)

## 2024-04-25 LAB — LIPID PANEL
Chol/HDL Ratio: 3.2 ratio (ref 0.0–5.0)
Cholesterol, Total: 133 mg/dL (ref 100–199)
HDL: 41 mg/dL (ref >39)
LDL Chol Calc (NIH): 66 mg/dL (ref 0–99)
Triglycerides: 148 mg/dL (ref 0–149)
VLDL Cholesterol Cal: 26 mg/dL (ref 5–40)

## 2024-04-25 LAB — CBC WITH DIFFERENTIAL/PLATELET
Basophils Absolute: 0.1 x10E3/uL (ref 0.0–0.2)
Basos: 1 %
EOS (ABSOLUTE): 0.3 x10E3/uL (ref 0.0–0.4)
Eos: 4 %
Hematocrit: 48.8 % (ref 37.5–51.0)
Hemoglobin: 16 g/dL (ref 13.0–17.7)
Immature Grans (Abs): 0 x10E3/uL (ref 0.0–0.1)
Immature Granulocytes: 0 %
Lymphocytes Absolute: 1.7 x10E3/uL (ref 0.7–3.1)
Lymphs: 26 %
MCH: 31.3 pg (ref 26.6–33.0)
MCHC: 32.8 g/dL (ref 31.5–35.7)
MCV: 95 fL (ref 79–97)
Monocytes Absolute: 0.6 x10E3/uL (ref 0.1–0.9)
Monocytes: 9 %
Neutrophils Absolute: 3.9 x10E3/uL (ref 1.4–7.0)
Neutrophils: 60 %
Platelets: 171 x10E3/uL (ref 150–450)
RBC: 5.12 x10E6/uL (ref 4.14–5.80)
RDW: 12.7 % (ref 11.6–15.4)
WBC: 6.4 x10E3/uL (ref 3.4–10.8)

## 2024-04-25 LAB — TSH: TSH: 2.02 u[IU]/mL (ref 0.450–4.500)

## 2024-04-25 LAB — CYCLIC CITRUL PEPTIDE ANTIBODY, IGG/IGA: Cyclic Citrullin Peptide Ab: 9 U (ref 0–19)

## 2024-04-25 LAB — SEDIMENTATION RATE: Sed Rate: 9 mm/h (ref 0–30)

## 2024-04-25 LAB — RHEUMATOID FACTOR: Rheumatoid fact SerPl-aCnc: <10 [IU]/mL (ref <14.0)

## 2024-04-25 LAB — B12 AND FOLATE PANEL
Folate: 16.7 ng/mL (ref >3.0)
Vitamin B-12: 783 pg/mL (ref 232–1245)

## 2024-04-25 LAB — HEMOGLOBIN A1C
Est. average glucose Bld gHb Est-mCnc: 114 mg/dL
Hgb A1c MFr Bld: 5.6 % (ref 4.8–5.6)

## 2024-04-27 MED ORDER — DICLOFENAC-MISOPROSTOL 75-0.2 MG PO TBEC: 1.0000 | DELAYED_RELEASE_TABLET | Freq: Two times a day (BID) | ORAL | 0 refills | Status: AC

## 2024-04-29 ENCOUNTER — Other Ambulatory Visit: Payer: Self-pay | Admitting: Urology

## 2024-04-29 ENCOUNTER — Telehealth: Payer: Self-pay | Admitting: Cardiology

## 2024-04-29 NOTE — Telephone Encounter (Signed)
   Pre-operative Risk Assessment    Patient Name: Alex Boyle  DOB: [birth year] MRN: [id number]   Date of last office visit: 07/12/23 Date of next office visit: Unknown    Request for Surgical Clearance    Procedure:  Resection of Prostate and Cystolitholapaxy  Date of Surgery:  Clearance 06/23/24                                Surgeon:  Dr Watt Socks Group or Practice Name:  [facility]  Phone number:  [phone] Fax number:  [phone]   Type of Clearance Requested:   - Pharmacy:  Hold Any blood thinners and   aspirin    Type of Anesthesia:  General    Additional requests/questions:    Bonney Hamilton Bergeron   04/29/2024, 4:06 PM

## 2024-04-29 NOTE — Telephone Encounter (Signed)
 Patient is not on anticoagulation or antiplatelet per review of current medical record in Epic.  If medical cardiac clearance is requested, please send second request.

## 2024-05-06 NOTE — Telephone Encounter (Signed)
 Will send to preop APP to review the notes from preop APP Lamarr Satterfield, DNP. 2nd request was sent to our office today. Will need further advice from preop APP if pt needs appt

## 2024-05-06 NOTE — Telephone Encounter (Signed)
   Name: Alex Boyle  DOB: August 13, 1945  MRN: [id number]  Primary Cardiologist: None  Chart reviewed as part of pre-operative protocol coverage. Because of [name]'s past medical history and time since last visit, he will require a follow-up in-office visit in order to better assess preoperative cardiovascular risk.  Patient is overdue for 26-month follow-up.  Pre-op covering staff: - Please schedule appointment and call patient to inform them. If patient already had an upcoming appointment within acceptable timeframe, please add pre-op clearance to the appointment notes so provider is aware. - Please contact requesting surgeon's office via preferred method (i.e, phone, fax) to inform them of need for appointment prior to surgery.  Barth Trella D Castiel Lauricella, NP  05/06/2024, 1:00 PM

## 2024-05-06 NOTE — Telephone Encounter (Signed)
   [facility] Pre-operative Risk Assessment    Request for surgical clearance:  What type of surgery is being performed?  Resection of Prostate and Cystolitholapaxy    When is this surgery scheduled?  06/23/24   What type of clearance is required (medical clearance vs. Pharmacy clearance to hold med vs. Both)?  MEDICAL  Are there any medications that need to be held prior to surgery and how long?   Practice name and name of physician performing surgery?  [facility]  Dr. Watt    What is your office phone number? [phone] (ext#: [phone])   7.   What is your office fax number [phone]  8.   Anesthesia type (None, local, MAC, general)?  General    Alex Boyle 05/06/2024, 12:41 PM  _________________________________Clearance request has been corrected.

## 2024-05-06 NOTE — Telephone Encounter (Signed)
 Will send message to [facility] scheduling team to see if they can get pt scheduled for preop appt.

## 2024-05-11 NOTE — Telephone Encounter (Signed)
 Pt has appt with Dr. Bernie 05/14/24.

## 2024-05-13 DIAGNOSIS — E785 Hyperlipidemia, unspecified: Secondary | ICD-10-CM | POA: Insufficient documentation

## 2024-05-13 DIAGNOSIS — J449 Chronic obstructive pulmonary disease, unspecified: Secondary | ICD-10-CM | POA: Insufficient documentation

## 2024-05-13 DIAGNOSIS — I219 Acute myocardial infarction, unspecified: Secondary | ICD-10-CM | POA: Insufficient documentation

## 2024-05-13 DIAGNOSIS — T7840XA Allergy, unspecified, initial encounter: Secondary | ICD-10-CM | POA: Insufficient documentation

## 2024-05-13 DIAGNOSIS — I10 Essential (primary) hypertension: Secondary | ICD-10-CM | POA: Insufficient documentation

## 2024-05-13 DIAGNOSIS — M1990 Unspecified osteoarthritis, unspecified site: Secondary | ICD-10-CM | POA: Insufficient documentation

## 2024-05-13 DIAGNOSIS — K5792 Diverticulitis of intestine, part unspecified, without perforation or abscess without bleeding: Secondary | ICD-10-CM | POA: Insufficient documentation

## 2024-05-13 DIAGNOSIS — G4730 Sleep apnea, unspecified: Secondary | ICD-10-CM | POA: Insufficient documentation

## 2024-05-14 ENCOUNTER — Encounter: Payer: Self-pay | Admitting: Cardiology

## 2024-05-14 ENCOUNTER — Ambulatory Visit: Attending: Cardiology | Admitting: Cardiology

## 2024-05-14 VITALS — BP 108/70 | HR 68 | Ht 72.0 in | Wt 205.8 lb

## 2024-05-14 DIAGNOSIS — R0609 Other forms of dyspnea: Secondary | ICD-10-CM

## 2024-05-14 DIAGNOSIS — I10 Essential (primary) hypertension: Secondary | ICD-10-CM

## 2024-05-14 DIAGNOSIS — E782 Mixed hyperlipidemia: Secondary | ICD-10-CM | POA: Diagnosis not present

## 2024-05-14 DIAGNOSIS — G4730 Sleep apnea, unspecified: Secondary | ICD-10-CM | POA: Diagnosis not present

## 2024-05-14 DIAGNOSIS — I2510 Atherosclerotic heart disease of native coronary artery without angina pectoris: Secondary | ICD-10-CM

## 2024-05-14 NOTE — Patient Instructions (Signed)
 Medication Instructions:  Your physician recommends that you continue on your current medications as directed. Please refer to the Current Medication list given to you today.  *If you need a refill on your cardiac medications before your next appointment, please call your pharmacy*   Lab Work: None Ordered If you have labs (blood work) drawn today and your tests are completely normal, you will receive your results only by: MyChart Message (if you have MyChart) OR A paper copy in the mail If you have any lab test that is abnormal or we need to change your treatment, we will call you to review the results.   Testing/Procedures: Your physician has requested that you have an echocardiogram. Echocardiography is a painless test that uses sound waves to create images of your heart. It provides your doctor with information about the size and shape of your heart and how well your heart's chambers and valves are working. This procedure takes approximately one hour. There are no restrictions for this procedure. Please do NOT wear cologne, perfume, aftershave, or lotions (deodorant is allowed). Please arrive 15 minutes prior to your appointment time.  Please note: We ask at that you not bring children with you during ultrasound (echo/ vascular) testing. Due to room size and safety concerns, children are not allowed in the ultrasound rooms during exams. Our front office staff cannot provide observation of children in our lobby area while testing is being conducted. An adult accompanying a patient to their appointment will only be allowed in the ultrasound room at the discretion of the ultrasound technician under special circumstances. We apologize for any inconvenience.   Your physician has requested that you have a lexiscan myoview. For further information please visit [email or web address] Please follow instruction sheet, as given.  The test will take approximately 3 to 4 hours to complete; you may bring  reading material.  If someone comes with you to your appointment, they will need to remain in the main lobby due to limited space in the testing area.    How to prepare for your Myocardial Perfusion Test: Do not eat or drink 3 hours prior to your test, except you may have water. Do not consume products containing caffeine (regular or decaffeinated) 12 hours prior to your test. (ex: coffee, chocolate, sodas, tea). Do bring a list of your current medications with you.  If not listed below, you may take your medications as normal. Do wear comfortable clothes (no dresses or overalls) and walking shoes, tennis shoes preferred (No heels or open toe shoes are allowed). Do NOT wear cologne, perfume, aftershave, or lotions (deodorant is allowed). If these instructions are not followed, your test will have to be rescheduled.     Follow-Up: At [facility], you and your health needs are our priority.  As part of our continuing mission to provide you with exceptional heart care, we have created designated Provider Care Teams.  These Care Teams include your primary Cardiologist (physician) and Advanced Practice Providers (APPs -  Physician Assistants and Nurse Practitioners) who all work together to provide you with the care you need, when you need it.  We recommend signing up for the patient portal called "MyChart".  Sign up information is provided on this After Visit Summary.  MyChart is used to connect with patients for Virtual Visits (Telemedicine).  Patients are able to view lab/test results, encounter notes, upcoming appointments, etc.  Non-urgent messages can be sent to your provider as well.   To learn more about what you  can do with MyChart, go to [email or web address].    Your next appointment:   6 month(s)  The format for your next appointment:   In Person  Provider:   Gypsy Balsam, MD    Other Instructions NA

## 2024-05-14 NOTE — Progress Notes (Unsigned)
 Cardiology Office Note:    Date:  05/14/2024   ID:  Alex Boyle, Alex Boyle 1946-01-05, MRN [id number]  PCP:  Lowella Benton CROME, PA  Cardiologist:  Lamar Fitch, MD    Referring MD: Lowella Benton CROME, [place]   No chief complaint on file.   History of Present Illness:    Alex Boyle is a 79 y.o. male  with a history of ASCAD s/p remote MI with PCI x 2, HTN, hyperlipidemia, OSA and asthma.  He recently moved here from [facility].  He is s/p remote MI in 2012 at which time he had total occlusion of the LAD and underwent PCI.  He was also noted to have high-grade lesion in his left circumflex but was medically managed.  Several months later he had a follow-up nuclear stress test and collapsed on the treadmill.  He underwent PCI of the left circumflex.  His last cath was October 2016 which showed widely patent stents and no other CAD  Comes today to my office to follow-up also he will need to have his surgery on his prostate.  He does have multiple stones as well as prostate hypertrophy surgery is being planned.  Described the fact that sometimes when he walk he will get uneasy sensation in the chest.  That sensation can linger for hours it is a different compared to sensation he had before but overall worsened also described to have some shortness of breath with exertion  Past Medical History:  Diagnosis Date   Allergy    Arthritis    Asthma    COPD (chronic obstructive pulmonary disease) (HCC)    boderline per pt   Diverticulitis    Hyperlipidemia    Hypertension    Myocardial infarction [facility])    Sleep apnea    uses bipap   Sleep apnea in adult    Venous insufficiency 01/08/2019    Past Surgical History:  Procedure Laterality Date   COLON SURGERY     COLONOSCOPY     CORONARY STENT PLACEMENT     x2   FOOT SURGERY     JOINT REPLACEMENT     knee   LAPAROSCOPIC TRANSABDOMINAL HERNIA     MANDIBLE SURGERY     skin cancer     basil cell removed on back   UPPER  GASTROINTESTINAL ENDOSCOPY      Current Medications: Current Meds  Medication Sig   albuterol  (VENTOLIN  HFA) 108 (90 Base) MCG/ACT inhaler Inhale 1-2 puffs into the lungs every 6 (six) hours as needed for wheezing or shortness of breath.   B Complex Vitamins (VITAMIN-B COMPLEX PO) Take 1 tablet by mouth daily.   Cholecalciferol 25 MCG (1000 UT) capsule Take 1,000 Units by mouth daily.   clindamycin  (CLEOCIN ) 150 MG capsule Take 150 mg by mouth as needed (Dental procedure).   Diclofenac -miSOPROStol  75-0.2 MG TBEC Take 1 tablet by mouth in the morning and at bedtime.   ezetimibe  (ZETIA ) 10 MG tablet Take 1 tablet (10 mg total) by mouth daily.   finasteride  (PROSCAR ) 5 MG tablet Take 1qd (Plz sched appt with new provider for future fills)   fluticasone  furoate-vilanterol (BREO ELLIPTA ) 200-25 MCG/ACT AEPB INHALE ONE PUFF BY MOUTH ONE TIME DAILY   ipratropium (ATROVENT ) 0.03 % nasal spray Place 2 sprays into both nostrils every 12 (twelve) hours.   lisinopril  (ZESTRIL ) 5 MG tablet Take 1 tablet (5 mg total) by mouth daily.   metoprolol  tartrate (LOPRESSOR ) 25 MG tablet Take 1 tablet (25 mg  total) by mouth 2 (two) times daily.   montelukast  (SINGULAIR ) 10 MG tablet TAKE ONE TABLET BY MOUTH AT BEDTIME   Multiple Vitamins-Minerals (MULTIVITAMIN ADULTS 50+ PO) Take 1 tablet by mouth daily.   nitroGLYCERIN  (NITROSTAT ) 0.4 MG SL tablet Place 0.4 mg under the tongue every 5 (five) minutes as needed for chest pain.   omeprazole  (PRILOSEC ) 40 MG capsule Take 1 capsule (40 mg total) by mouth daily.   rosuvastatin  (CRESTOR ) 40 MG tablet Take 1 tablet (40 mg total) by mouth daily. 1rst attempt, patient needs and appt for additional refills   tamsulosin  (FLOMAX ) 0.4 MG CAPS capsule Take 1qd (Plz sched appt with new provider for future fills)     Allergies:   Codeine, Gramineae pollens, and Penicillins   Social History   Socioeconomic History   Marital status: Married    Spouse name: Not on file    Number of children: 1   Years of education: Not on file   Highest education level: Some college, no degree  Occupational History   Not on file  Tobacco Use   Smoking status: Former    Types: Cigarettes   Smokeless tobacco: Never  Vaping Use   Vaping status: Never Used  Substance and Sexual Activity   Alcohol use: Yes    Comment: soically    Drug use: Never   Sexual activity: Yes    Birth control/protection: None  Other Topics Concern   Not on file  Social History Narrative   Not on file   Social Drivers of Health   Financial Resource Strain: Low Risk  (04/18/2024)   Overall Financial Resource Strain (CARDIA)    Difficulty of Paying Living Expenses: Not hard at all  Food Insecurity: No Food Insecurity (04/18/2024)   Hunger Vital Sign    Worried About Running Out of Food in the Last Year: Never true    Ran Out of Food in the Last Year: Never true  Transportation Needs: No Transportation Needs (04/18/2024)   PRAPARE - Administrator, Civil Service (Medical): No    Lack of Transportation (Non-Medical): No  Physical Activity: Insufficiently Active (04/18/2024)   Exercise Vital Sign    Days of Exercise per Week: 3 days    Minutes of Exercise per Session: 20 min  Stress: No Stress Concern Present (04/18/2024)   [employer] of Occupational Health - Occupational Stress Questionnaire    Feeling of Stress: Not at all  Social Connections: Moderately Integrated (04/18/2024)   Social Connection and Isolation Panel    Frequency of Communication with Friends and Family: Once a week    Frequency of Social Gatherings with Friends and Family: Once a week    Attends Religious Services: More than 4 times per year    Active Member of [employer] or Organizations: Yes    Attends Engineer, Structural: More than 4 times per year    Marital Status: Married     Family History: The patient's family history includes Alzheimer's disease in his mother; Arthritis in his mother;  Heart disease in his mother; Hypertension in his mother; Uterine cancer in his mother. There is no history of Colon cancer, Pancreatic cancer, Esophageal cancer, Rectal cancer, or Stomach cancer. He was adopted. ROS:   Please see the history of present illness.    All 14 point review of systems negative except as described per history of present illness  EKGs/Labs/Other Studies Reviewed:    EKG Interpretation Date/Time:  Thursday May 14 2024 08:13:37  EST Ventricular Rate:  68 PR Interval:  168 QRS Duration:  80 QT Interval:  408 QTC Calculation: 433 R Axis:   -4  Text Interpretation: Normal sinus rhythm Inferior infarct (cited on or before 12-Jul-2023) Cannot rule out Anterior infarct (cited on or before 12-Jul-2023) When compared with ECG of 12-Jul-2023 09:51, Premature atrial complexes are no longer Present Confirmed by Bernie Charleston [phone]) on 05/14/2024 8:16:41 AM    Recent Labs: 04/22/2024: ALT 21; BUN 13; Creatinine, Ser 0.81; Hemoglobin 16.0; Platelets 171; Potassium 4.3; Sodium 140; TSH 2.020  Recent Lipid Panel    Component Value Date/Time   CHOL 133 04/22/2024 0905   TRIG 148 04/22/2024 0905   HDL 41 04/22/2024 0905   CHOLHDL 3.2 04/22/2024 0905   CHOLHDL 5 11/15/2020 0932   VLDL 44.2 (H) 11/15/2020 0932   LDLCALC 66 04/22/2024 0905   LDLCALC 79 10/31/2018 1100   LDLDIRECT 74.0 02/14/2021 1004    Physical Exam:    VS:  BP 108/70   Pulse 68   Ht 6' (1.829 m)   Wt 205 lb 12 oz (93.3 kg)   SpO2 96%   BMI 27.90 kg/m     Wt Readings from Last 3 Encounters:  05/14/24 205 lb 12 oz (93.3 kg)  04/22/24 210 lb (95.3 kg)  02/19/24 202 lb (91.6 kg)     GEN:  Well nourished, well developed in no acute distress HEENT: Normal NECK: No JVD; No carotid bruits LYMPHATICS: No lymphadenopathy CARDIAC: RRR, no murmurs, no rubs, no gallops RESPIRATORY:  Clear to auscultation without rales, wheezing or rhonchi  ABDOMEN: Soft, non-tender,  non-distended MUSCULOSKELETAL:  No edema; No deformity  SKIN: Warm and dry LOWER EXTREMITIES: no swelling NEUROLOGIC:  Alert and oriented x 3 PSYCHIATRIC:  Normal affect   ASSESSMENT:    1. Essential hypertension   2. Coronary artery disease involving native coronary artery of native heart without angina pectoris   3. Sleep apnea in adult   4. Mixed hyperlipidemia    PLAN:    In order of problems listed above:  Cardiovascular evaluation before surgery.  He does have some symptoms concerning.  I discovered that he does not take aspirin  and I told him that he must take 1 baby aspirin  every single day, we will schedule him to have exercise Cardiolite, because of dyspnea on exertion echocardiogram will be done. Essential hypertension blood pressure well-controlled continue present management. Dyslipidemia I did review KPN LDL 66 HDL 41 continue present management which include high intensity statin form of Crestor  40 as well as Zetia  10. Sleep apnea he does use CPAP on the regular basis.  In terms of clearance for surgery if stress test and echocardiogram normal they should be no problem proceed with surgery as scheduled   Medication Adjustments/Labs and Tests Ordered: Current medicines are reviewed at length with the patient today.  Concerns regarding medicines are outlined above.  Orders Placed This Encounter  Procedures   EKG 12-Lead   Medication changes: No orders of the defined types were placed in this encounter.   Signed, [place] DOROTHA Bernie, MD, [facility] 05/14/2024 8:32 AM    [facility]

## 2024-05-18 ENCOUNTER — Other Ambulatory Visit: Payer: Self-pay | Admitting: Cardiology

## 2024-05-18 ENCOUNTER — Telehealth (HOSPITAL_COMMUNITY): Payer: Self-pay

## 2024-05-18 DIAGNOSIS — R0609 Other forms of dyspnea: Secondary | ICD-10-CM

## 2024-05-18 NOTE — Telephone Encounter (Signed)
 Detailed instructions left on the patient's answering machine. Alex Boyle CCT

## 2024-05-19 ENCOUNTER — Encounter (HOSPITAL_COMMUNITY)

## 2024-05-19 ENCOUNTER — Inpatient Hospital Stay (HOSPITAL_COMMUNITY): Admission: RE | Admit: 2024-05-19 | Discharge: 2024-05-19 | Attending: Cardiology | Admitting: Cardiology

## 2024-05-19 DIAGNOSIS — R0609 Other forms of dyspnea: Secondary | ICD-10-CM

## 2024-05-19 LAB — MYOCARDIAL PERFUSION IMAGING
Angina Index: 0
Base ST Depression (mm): 0 mm
Duke Treadmill Score: 6
Estimated workload: 7
Exercise duration (min): 6 min
LV dias vol: 93 mL (ref 62–150)
LV sys vol: 32 mL (ref 4.2–5.8)
MPHR: 143 {beats}/min
Nuc Stress EF: 66 %
Peak HR: 141 {beats}/min
Percent HR: 98 %
RPE: 18
Rest HR: 71 {beats}/min
Rest Nuclear Isotope Dose: 10.8 mCi
SDS: 1
SRS: 6
SSS: 5
ST Depression (mm): 0 mm
Stress Nuclear Isotope Dose: 31.9 mCi
TID: 0.82

## 2024-05-19 MED ORDER — TECHNETIUM TC 99M TETROFOSMIN IV KIT
31.9000 | PACK | Freq: Once | INTRAVENOUS | Status: AC | PRN
  Administered 2024-05-19: 31.9 via INTRAVENOUS

## 2024-05-19 MED ORDER — TECHNETIUM TC 99M TETROFOSMIN IV KIT
10.8000 | PACK | Freq: Once | INTRAVENOUS | Status: AC | PRN
  Administered 2024-05-19: 10.8 via INTRAVENOUS

## 2024-05-20 ENCOUNTER — Encounter (INDEPENDENT_AMBULATORY_CARE_PROVIDER_SITE_OTHER): Payer: Self-pay

## 2024-05-22 ENCOUNTER — Ambulatory Visit: Payer: Self-pay | Admitting: Cardiology

## 2024-06-01 ENCOUNTER — Other Ambulatory Visit: Payer: Self-pay | Admitting: Pulmonary Disease

## 2024-06-01 DIAGNOSIS — T7840XA Allergy, unspecified, initial encounter: Secondary | ICD-10-CM

## 2024-06-09 ENCOUNTER — Ambulatory Visit (HOSPITAL_BASED_OUTPATIENT_CLINIC_OR_DEPARTMENT_OTHER)
Admission: RE | Admit: 2024-06-09 | Discharge: 2024-06-09 | Disposition: A | Discharge status: Home / Self Care | Source: Ambulatory Visit | Attending: Cardiology | Admitting: Cardiology

## 2024-06-09 DIAGNOSIS — R0609 Other forms of dyspnea: Secondary | ICD-10-CM | POA: Insufficient documentation

## 2024-06-09 LAB — ECHOCARDIOGRAM COMPLETE
AR max vel: 2.19 cm2
AV Area VTI: 2.47 cm2
AV Area mean vel: 2.45 cm2
AV Mean grad: 4 mmHg
AV Peak grad: 7.6 mmHg
AV Vena cont: 0.2 cm
Ao pk vel: 1.38 m/s
Area-P 1/2: 2.22 cm2
Calc EF: 71.7 %
S' Lateral: 2.9 cm
Single Plane A2C EF: 67.5 %
Single Plane A4C EF: 75.2 %

## 2024-06-10 ENCOUNTER — Telehealth: Payer: Self-pay | Admitting: Cardiology

## 2024-06-10 ENCOUNTER — Ambulatory Visit: Admitting: Family Medicine

## 2024-06-10 ENCOUNTER — Encounter: Payer: Self-pay | Admitting: Family Medicine

## 2024-06-10 VITALS — BP 124/73 | HR 71 | Ht 72.0 in | Wt 210.0 lb

## 2024-06-10 DIAGNOSIS — J40 Bronchitis, not specified as acute or chronic: Secondary | ICD-10-CM

## 2024-06-10 DIAGNOSIS — J329 Chronic sinusitis, unspecified: Secondary | ICD-10-CM | POA: Diagnosis not present

## 2024-06-10 MED ORDER — AZITHROMYCIN 250 MG PO TABS: ORAL_TABLET | ORAL | 0 refills | Status: AC

## 2024-06-10 MED ORDER — PREDNISONE 20 MG PO TABS: 20.0000 mg | ORAL_TABLET | Freq: Two times a day (BID) | ORAL | 0 refills | Status: AC

## 2024-06-10 NOTE — Assessment & Plan Note (Signed)
 Treating with course of azithromycin  and prednisone . Recommend continued supportive care with increased fluids.  Red flags reviewed.  F/u if not improving.

## 2024-06-10 NOTE — Patient Instructions (Signed)
 Start azithromycin  and prednisone .  Stay well hydrated.  Let me know if not improving.

## 2024-06-10 NOTE — Progress Notes (Signed)
 " Alex Boyle - 78 y.o. male MRN [id number]  Date of birth: [birth year]  Subjective Chief Complaint  Patient presents with   Nasal Congestion   Cough    Using OTC meds, turning into chest congestion    HPI Alex Boyle is a 78 y.o. male here today with complaint of cough, chest congestion, drainage.  Symptoms started about 1 week ago and seem to be progressing rather than improving.  History of COPD and asthma.  He has had pneumonia or multiple occasions and this feels similar to how previous episodes started.  Cough is productive of thick mucus.  He has has some wheezing intermittently. He denies fever at this time.  No body aches or significant fatigue.  He has tried OTC medications with minimal relief.   ROS:  A comprehensive ROS was completed and negative except as noted per HPI  Allergies[1]  Past Medical History:  Diagnosis Date   Allergy    Arthritis    Asthma    COPD (chronic obstructive pulmonary disease) (HCC)    boderline per pt   Diverticulitis    Hyperlipidemia    Hypertension    Myocardial infarction (HCC)    Sleep apnea    uses bipap   Sleep apnea in adult    Venous insufficiency 01/08/2019    Past Surgical History:  Procedure Laterality Date   COLON SURGERY     COLONOSCOPY     CORONARY STENT PLACEMENT     x2   FOOT SURGERY     JOINT REPLACEMENT     knee   LAPAROSCOPIC TRANSABDOMINAL HERNIA     MANDIBLE SURGERY     skin cancer     basil cell removed on back   UPPER GASTROINTESTINAL ENDOSCOPY      Social History   Socioeconomic History   Marital status: Married    Spouse name: Not on file   Number of children: 1   Years of education: Not on file   Highest education level: Some college, no degree  Occupational History   Not on file  Tobacco Use   Smoking status: Former    Types: Cigarettes   Smokeless tobacco: Never  Vaping Use   Vaping status: Never Used  Substance and Sexual Activity   Alcohol use: Yes    Comment:  soically    Drug use: Never   Sexual activity: Yes    Birth control/protection: None  Other Topics Concern   Not on file  Social History Narrative   Not on file   Social Drivers of Health   Tobacco Use: Medium Risk (05/14/2024)   Patient History    Smoking Tobacco Use: Former    Smokeless Tobacco Use: Never    Passive Exposure: Not on Actuary Strain: Low Risk (04/18/2024)   Overall Financial Resource Strain (CARDIA)    Difficulty of Paying Living Expenses: Not hard at all  Food Insecurity: No Food Insecurity (04/18/2024)   Epic    Worried About Programme Researcher, Broadcasting/film/video in the Last Year: Never true    Ran Out of Food in the Last Year: Never true  Transportation Needs: No Transportation Needs (04/18/2024)   Epic    Lack of Transportation (Medical): No    Lack of Transportation (Non-Medical): No  Physical Activity: Insufficiently Active (04/18/2024)   Exercise Vital Sign    Days of Exercise per Week: 3 days    Minutes of Exercise per Session: 20 min  Stress: No Stress Concern  Present (04/18/2024)   [employer] of Occupational Health - Occupational Stress Questionnaire    Feeling of Stress: Not at all  Social Connections: Moderately Integrated (04/18/2024)   Social Connection and Isolation Panel    Frequency of Communication with Friends and Family: Once a week    Frequency of Social Gatherings with Friends and Family: Once a week    Attends Religious Services: More than 4 times per year    Active Member of Clubs or Organizations: Yes    Attends Banker Meetings: More than 4 times per year    Marital Status: Married  Depression (PHQ2-9): Low Risk (04/22/2024)   Depression (PHQ2-9)    PHQ-2 Score: 0  Alcohol Screen: Low Risk (04/18/2024)   Alcohol Screen    Last Alcohol Screening Score (AUDIT): 1  Housing: Low Risk (04/18/2024)   Epic    Unable to Pay for Housing in the Last Year: No    Number of Times Moved in the Last Year: 0    Homeless in the  Last Year: No  Utilities: Not on file  Health Literacy: Not on file    Family History  Adopted: Yes  Problem Relation Age of Onset   Uterine cancer Mother    Heart disease Mother    Hypertension Mother    Arthritis Mother    Alzheimer's disease Mother    Colon cancer Neg Hx    Pancreatic cancer Neg Hx    Esophageal cancer Neg Hx    Rectal cancer Neg Hx    Stomach cancer Neg Hx     Health Maintenance  Topic Date Due   Medicare Annual Wellness (AWV)  Never done   Hepatitis C Screening  Never done   COVID-19 Vaccine (4 - 2025-26 season) 02/10/2024   Zoster Vaccines- Shingrix (1 of 2) 07/23/2024 (Originally 09/28/1964)   Pneumococcal Vaccine: 50+ Years  Completed   Influenza Vaccine  Completed   Meningococcal B Vaccine  Aged Out   DTaP/Tdap/Td  Discontinued   Colonoscopy  Discontinued     ----------------------------------------------------------------------------------------------------------------------------------------------------------------------------------------------------------------- Physical Exam BP 124/73   Pulse 71   Ht 6' (1.829 m)   Wt 210 lb (95.3 kg)   SpO2 95%   BMI 28.48 kg/m   Physical Exam Constitutional:      Appearance: Normal appearance.  Eyes:     General: No scleral icterus. Cardiovascular:     Rate and Rhythm: Normal rate and regular rhythm.  Pulmonary:     Effort: Pulmonary effort is normal.     Breath sounds: Normal breath sounds.  Musculoskeletal:     Cervical back: Neck supple.  Neurological:     Mental Status: He is alert.  Psychiatric:        Mood and Affect: Mood normal.        Behavior: Behavior normal.     ------------------------------------------------------------------------------------------------------------------------------------------------------------------------------------------------------------------- Assessment and Plan  Sinobronchitis Treating with course of azithromycin  and prednisone . Recommend  continued supportive care with increased fluids.  Red flags reviewed.  F/u if not improving.    Meds ordered this encounter  Medications   azithromycin  (ZITHROMAX ) 250 MG tablet    Sig: Take 2 tablets on day 1, then 1 tablet daily on days 2 through 5    Dispense:  6 tablet    Refill:  0   predniSONE  (DELTASONE ) 20 MG tablet    Sig: Take 1 tablet (20 mg total) by mouth 2 (two) times daily with a meal for 5 days.    Dispense:  10 tablet    Refill:  0    No follow-ups on file.         [1]  Allergies Allergen Reactions   Codeine Shortness Of Breath   Gramineae Pollens Itching   Penicillins Swelling   "

## 2024-06-10 NOTE — Telephone Encounter (Signed)
 Preop clearance routed to requested surgeon's office.

## 2024-06-10 NOTE — Telephone Encounter (Signed)
 Alex Boyle is following up. Please provide updates on clearance.  Phone#: [phone] (ext#: [phone]) Fax#: [phone]

## 2024-06-16 ENCOUNTER — Telehealth: Payer: Self-pay

## 2024-06-16 NOTE — Telephone Encounter (Signed)
 Pt viewed Echo results on My Chart per Dr. Vanetta Shawl note. Routed to PCP.

## 2024-06-17 NOTE — Patient Instructions (Addendum)
 SURGICAL WAITING ROOM VISITATION Patients having surgery or a procedure may have no more than 2 support people in the waiting area - these visitors may rotate in the visitor waiting room.   If the patient needs to stay at the hospital during part of their recovery, the visitor guidelines for inpatient rooms apply.  PRE-OP VISITATION  Pre-op nurse will coordinate an appropriate time for 1 support person to accompany the patient in pre-op.  This support person may not rotate.  This visitor will be contacted when the time is appropriate for the visitor to come back in the pre-op area.  To keep our patients, visitors and teammates safe and prevent the spread of respiratory illnesses over the next few months.  Temporary Visitor Restrictions  Children ages 12 and under will not be able to visit patients in [facility] under most circumstances. Visitation is not restricted outside of hospitals unless noted otherwise in the [facility] and Location Specific Visitation Guidelines at:       [email or web address]/.  Visitors with respiratory illnesses are discouraged from visiting and should remain at home. You are not required to quarantine at this time prior to your surgery. However, you must do this: Hand Hygiene often Do NOT share personal items Notify your provider if you are in close contact with someone who has COVID or you develop fever 100.4 or greater, new onset of sneezing, cough, sore throat, shortness of breath or body aches.  If you test positive for Covid or have been in contact with anyone that has tested positive in the last 10 days please notify you surgeon.    Your procedure is scheduled on:  TUESDAY 06-23-2024  Report to [facility] Main Entrance: [address] entrance where the [employer] is available.   Report to admitting at: 05:15    AM  Call this number if you have any questions or problems the morning of surgery [phone]  DO NOT EAT OR DRINK ANYTHING AFTER  MIDNIGHT THE NIGHT PRIOR TO YOUR SURGERY / PROCEDURE.   FOLLOW  ANY ADDITIONAL PRE OP INSTRUCTIONS YOU RECEIVED FROM YOUR SURGEON'S OFFICE!!!   Oral Hygiene is also important to reduce your risk of infection.        Remember - BRUSH YOUR TEETH THE MORNING OF SURGERY WITH YOUR REGULAR TOOTHPASTE  Do NOT smoke after Midnight the night before surgery.  STOP TAKING all Vitamins, Herbs and supplements 1 week before your surgery.   Take ONLY these medicines the morning of surgery with A SIP OF WATER :  Metoprolol , Finasteride , Tamsulosin , Omeprazole ,  and you may use the Atrovent  nasal spray, and your Inhalers,   DO NOT TAKE  Lisinopril , the morning of surgery.  If You have been diagnosed with Sleep Apnea - Bring CPAP mask and tubing day of surgery. We will provide you with a CPAP machine on the day of your surgery.                   You may not have any metal on your body including  jewelry, and body piercing  Do not wear  lotions, powders, cologne, or deodorant  Men may shave face and neck.  Contacts, Hearing Aids, dentures or bridgework may not be worn into surgery. DENTURES WILL BE REMOVED PRIOR TO SURGERY PLEASE DO NOT APPLY Poly grip OR ADHESIVES!!!  You may bring a small overnight bag with you on the day of surgery, only pack items that are not valuable. [facility] IS NOT RESPONSIBLE  FOR VALUABLES THAT ARE LOST OR STOLEN.   Do not bring your home medications to the hospital. The Pharmacy will dispense medications listed on your medication list to you during your admission in the Hospital.  Please read over the following fact sheets you were given: IF YOU HAVE QUESTIONS ABOUT YOUR PRE-OP INSTRUCTIONS, PLEASE CALL [phone].   [facility] - Preparing for Surgery       Before surgery, you can play an important role.  Because skin is not sterile, your skin needs to be as free of germs as possible.  You can reduce the number of germs on your skin by washing with CHG  (chlorahexidine gluconate) soap before surgery.  CHG is an antiseptic cleaner which kills germs and bonds with the skin to continue killing germs even after washing. Please DO NOT use if you have an allergy to CHG or antibacterial soaps.  If your skin becomes reddened/irritated stop using the CHG and inform your nurse when you arrive at Short Stay. Do not shave (including legs and underarms) for at least 48 hours prior to the first CHG shower.  You may shave your face/neck.  Please follow these instructions carefully:  1.  Shower with CHG Soap the night before surgery ONLY (DO NOT USE THE CHG SOAP THE MORNING OF SURGERY).  2.  If you choose to wash your hair, wash your hair first as usual with your normal  shampoo.  3.  After you shampoo, rinse your hair and body thoroughly to remove the shampoo.                             4.  Use CHG as you would any other liquid soap.  You can apply chg directly to the skin and wash.  Gently with a scrungie or clean washcloth.  5.  Apply the CHG Soap to your body ONLY FROM THE NECK DOWN.   Do not use on face/ open                           Wound or open sores. Avoid contact with eyes, ears mouth and genitals (private parts).                       Wash face,  Genitals (private parts) with your normal soap.             6.  Wash thoroughly, paying special attention to the area where your  surgery  will be performed.  7.  Thoroughly rinse your body with warm water  from the neck down.  8.  DO NOT shower/wash with your normal soap after using and rinsing off the CHG Soap.                9.  Pat yourself dry with a clean towel.            10.  Wear clean pajamas.            11.  Place clean sheets on your bed the night of your first shower and do not  sleep with pets.  Day of Surgery : Do not apply any CHG, lotions/deodorants the morning of surgery.  Please wear clean clothes to the hospital/surgery center.   FAILURE TO FOLLOW THESE INSTRUCTIONS MAY RESULT IN THE  CANCELLATION OF YOUR SURGERY  PATIENT SIGNATURE_________________________________  NURSE SIGNATURE__________________________________  ________________________________________________________________________

## 2024-06-17 NOTE — Progress Notes (Signed)
 COVID Vaccine received:  []  No [x]  Yes Date of any COVID positive Test in last 90 days: none  PCP - Velma Ku, DO, [name], [place]  Cardiologist - Lamar Fitch, MD  cardiac clearance per 06-10-24 Telephone note  Chest x-ray - 07-16-2022  2v  Epic EKG -  05-14-2024  Epic Stress Test - Lexiscan  05-19-2024 Epic ECHO - 06-09-2024  Epic  Cardiac Cath - [facility] - DES x 2 done in [place]   CT Coronary Calcium  score:   Pacemaker / ICD device [x]  No []  Yes   Spinal Cord Stimulator:[x]  No []  Yes       History of Sleep Apnea? []  No [x]  Yes   CPAP used?- []  No [x]  Yes   BiPAP  Medication on DOS: Metoprolol , Clindamycin  (Cleocin ), Finasteride , Tamsulosin , Omeprazole ,  Atrovent  nasal spray, Inhalers,   Hold DOS:  Lisinopril ,   Patient has: [x]  NO Hx DM   []  Pre-DM   []  DM1  []   DM2 Does the patient monitor blood sugar?   [x]  N/A   []  No []  Yes  Last A1c was:  5.6 on 04-22-2024       Blood Thinner / Instructions:  none Aspirin  Instructions:  none  Activity level: Able to walk up 2 flights of stairs without becoming significantly short of breath or having chest pain?   [x]    Yes   []  No,  would have:  Patient can perform ADLs without assistance.  [x]   Yes    []  No   Anesthesia review: HTN, DOE, Hx MI - CAD, asthma/ COPD, OSA - BiPAP, GERD,   Patient denies any S&S of respiratory illness or Covid - no shortness of breath, fever, cough or chest pain at PAT appointment.  Patient verbalized understanding and agreement to the Pre-Surgical Instructions that were given to them at this PAT appointment. Patient was also educated of the need to review these PAT instructions again prior to his surgery.I reviewed the appropriate phone numbers to call if they have any and questions or concerns.

## 2024-06-18 ENCOUNTER — Encounter (HOSPITAL_COMMUNITY)
Admission: RE | Admit: 2024-06-18 | Discharge: 2024-06-18 | Disposition: A | Discharge status: Home / Self Care | Source: Ambulatory Visit | Attending: Urology | Admitting: Urology

## 2024-06-18 ENCOUNTER — Encounter (HOSPITAL_COMMUNITY): Payer: Self-pay

## 2024-06-18 ENCOUNTER — Other Ambulatory Visit: Payer: Self-pay

## 2024-06-18 VITALS — BP 120/82 | HR 68 | Temp 98.2°F | Resp 16 | Ht 72.0 in | Wt 205.0 lb

## 2024-06-18 DIAGNOSIS — J4489 Other specified chronic obstructive pulmonary disease: Secondary | ICD-10-CM | POA: Insufficient documentation

## 2024-06-18 DIAGNOSIS — D72829 Elevated white blood cell count, unspecified: Secondary | ICD-10-CM | POA: Diagnosis not present

## 2024-06-18 DIAGNOSIS — I2510 Atherosclerotic heart disease of native coronary artery without angina pectoris: Secondary | ICD-10-CM | POA: Insufficient documentation

## 2024-06-18 DIAGNOSIS — K219 Gastro-esophageal reflux disease without esophagitis: Secondary | ICD-10-CM | POA: Insufficient documentation

## 2024-06-18 DIAGNOSIS — Z87891 Personal history of nicotine dependence: Secondary | ICD-10-CM | POA: Insufficient documentation

## 2024-06-18 DIAGNOSIS — Z7982 Long term (current) use of aspirin: Secondary | ICD-10-CM | POA: Diagnosis not present

## 2024-06-18 DIAGNOSIS — G4733 Obstructive sleep apnea (adult) (pediatric): Secondary | ICD-10-CM | POA: Insufficient documentation

## 2024-06-18 DIAGNOSIS — I10 Essential (primary) hypertension: Secondary | ICD-10-CM | POA: Insufficient documentation

## 2024-06-18 DIAGNOSIS — Z955 Presence of coronary angioplasty implant and graft: Secondary | ICD-10-CM | POA: Insufficient documentation

## 2024-06-18 DIAGNOSIS — Z01812 Encounter for preprocedural laboratory examination: Secondary | ICD-10-CM | POA: Insufficient documentation

## 2024-06-18 DIAGNOSIS — I872 Venous insufficiency (chronic) (peripheral): Secondary | ICD-10-CM | POA: Insufficient documentation

## 2024-06-18 DIAGNOSIS — Z01818 Encounter for other preprocedural examination: Secondary | ICD-10-CM

## 2024-06-18 DIAGNOSIS — Z79899 Other long term (current) drug therapy: Secondary | ICD-10-CM | POA: Insufficient documentation

## 2024-06-18 DIAGNOSIS — Z7951 Long term (current) use of inhaled steroids: Secondary | ICD-10-CM | POA: Diagnosis not present

## 2024-06-18 DIAGNOSIS — I252 Old myocardial infarction: Secondary | ICD-10-CM | POA: Diagnosis not present

## 2024-06-18 DIAGNOSIS — E785 Hyperlipidemia, unspecified: Secondary | ICD-10-CM | POA: Insufficient documentation

## 2024-06-18 DIAGNOSIS — T39016A Underdosing of aspirin, initial encounter: Secondary | ICD-10-CM | POA: Insufficient documentation

## 2024-06-18 HISTORY — DX: Pneumonia, unspecified organism: J18.9

## 2024-06-18 HISTORY — DX: Personal history of urinary calculi: Z87.442

## 2024-06-18 HISTORY — DX: Peripheral vascular disease, unspecified: I73.9

## 2024-06-18 HISTORY — DX: Malignant (primary) neoplasm, unspecified: C80.1

## 2024-06-18 LAB — CBC
HCT: 45.7 % (ref 39.0–52.0)
Hemoglobin: 16.1 g/dL (ref 13.0–17.0)
MCH: 31.5 pg (ref 26.0–34.0)
MCHC: 35.2 g/dL (ref 30.0–36.0)
MCV: 89.4 fL (ref 80.0–100.0)
Platelets: 265 K/uL (ref 150–400)
RBC: 5.11 MIL/uL (ref 4.22–5.81)
RDW: 13.1 % (ref 11.5–15.5)
WBC: 12.1 K/uL — ABNORMAL HIGH (ref 4.0–10.5)
nRBC: 0.2 % (ref 0.0–0.2)

## 2024-06-18 LAB — BASIC METABOLIC PANEL WITH GFR
Anion gap: 9 (ref 5–15)
BUN: 15 mg/dL (ref 8–23)
CO2: 30 mmol/L (ref 22–32)
Calcium: 9.8 mg/dL (ref 8.9–10.3)
Chloride: 99 mmol/L (ref 98–111)
Creatinine, Ser: 0.88 mg/dL (ref 0.61–1.24)
GFR, Estimated: >60 mL/min
Glucose, Bld: 99 mg/dL (ref 70–99)
Potassium: 3.8 mmol/L (ref 3.5–5.1)
Sodium: 138 mmol/L (ref 135–145)

## 2024-06-19 NOTE — Anesthesia Preprocedure Evaluation (Signed)
 "                                  Anesthesia Evaluation  Patient identified by MRN, date of birth, ID band Patient awake    Reviewed: Allergy & Precautions, NPO status , Patient's Chart, lab work & pertinent test results  Airway Mallampati: II  TM Distance: >3 FB Neck ROM: Full    Dental no notable dental hx.    Pulmonary asthma , sleep apnea and Continuous Positive Airway Pressure Ventilation , COPD,  COPD inhaler, former smoker   Pulmonary exam normal        Cardiovascular hypertension, Pt. on medications and Pt. on home beta blockers + CAD, + Past MI, + Cardiac Stents (x 2) and + Peripheral Vascular Disease  Normal cardiovascular exam     Neuro/Psych negative neurological ROS  negative psych ROS   GI/Hepatic negative GI ROS, Neg liver ROS,,,  Endo/Other  negative endocrine ROS    Renal/GU negative Renal ROS     Musculoskeletal   Abdominal   Peds  Hematology negative hematology ROS (+)   Anesthesia Other Findings BENIGN PROSTATIC HYPERPLASIA BLADDER STONE  Reproductive/Obstetrics                              Anesthesia Physical Anesthesia Plan  ASA: 3  Anesthesia Plan: General   Post-op Pain Management:    Induction: Intravenous  PONV Risk Score and Plan: 2 and Ondansetron , Dexamethasone  and Treatment may vary due to age or medical condition  Airway Management Planned: LMA  Additional Equipment:   Intra-op Plan:   Post-operative Plan: Extubation in OR  Informed Consent: I have reviewed the patients History and Physical, chart, labs and discussed the procedure including the risks, benefits and alternatives for the proposed anesthesia with the patient or authorized representative who has indicated his/her understanding and acceptance.     Dental advisory given  Plan Discussed with: CRNA  Anesthesia Plan Comments: (PAT note by Lynwood Hope, PA-C: 79 year old male follows with cardiology for history of  CAD s/p MI 2012 with PCI to LAD and subsequent PCI to left circumflex, HTN, HLD, OSA on BiPAP.  Last cath October 2016 showed widely patent stents and no other CAD.  Seen by Dr. Bernie on 05/14/2024 for preop evaluation.  Per note, Cardiovascular evaluation before surgery.  He does have some symptoms concerning.  I discovered that he does not take aspirin  and I told him that he must take 1 baby aspirin  every single day, we will schedule him to have exercise Cardiolite, because of dyspnea on exertion echocardiogram will be done.  Nuclear stress 05/19/2024 showed normal perfusion, no evidence of ischemia or infarction, low risk.  Echo 06/09/2024 showed LVEF 60 to 65%, grade 1 DD, normal RV, no significant valvular abnormalities.  Other pertinent history includes former smoker, asthma/COPD (on Breo Ellipta , ipratropium, montelukast , and as needed albuterol ), GERD on PPI, venous insufficiency.  He was seen by PCP Dr. Velma Ku on 06/10/2024 for complaint of productive cough 1 week in duration.  He was treated for sinobronchitis with azithromycin  and prednisone .  At preadmission testing visit he denied any cough, shortness of breath, signs or symptoms of illness.  He reported he could go up 2 flights of steps without issue.  Preop labs reviewed, WBC mildly elevated at 12.1, otherwise WNL.  EKG 05/14/2024: NSR.  Rate 68.  Inferior infarct.  Cannot rule out anterior infarct.  TTE 06/09/2024: 1. Left ventricular ejection fraction, by estimation, is 60 to 65%. The  left ventricle has normal function. The left ventricle has no regional  wall motion abnormalities. Left ventricular diastolic parameters are  consistent with Grade I diastolic  dysfunction (impaired relaxation). The average left ventricular global  longitudinal strain is -20.0 %. The global longitudinal strain is normal.  2. Right ventricular systolic function is normal. The right ventricular  size is normal.  3. The mitral valve is  normal in structure. No evidence of mitral valve  regurgitation. No evidence of mitral stenosis.  4. The aortic valve is normal in structure. Aortic valve regurgitation is  trivial. No aortic stenosis is present.  5. The inferior vena cava is normal in size with greater than 50%  respiratory variability, suggesting right atrial pressure of 3 mmHg.   Nuclear stress 05/19/2024:   The study is normal. The study is low risk.   No ST deviation was noted.   LV perfusion is normal. There is no evidence of ischemia. There is no evidence of infarction.   Left ventricular function is normal. Nuclear stress EF: 66%. The left ventricular ejection fraction is hyperdynamic (>65%). End diastolic cavity size is normal. End systolic cavity size is normal.   CT images were obtained for attenuation correction and were examined for the presence of coronary calcium  when appropriate.   Coronary calcium  assessment not performed due to prior revascularization.   Prior study available for comparison from 05/17/2022.   )         Anesthesia Quick Evaluation  "

## 2024-06-19 NOTE — Progress Notes (Signed)
 Anesthesia Chart Review:  79 year old male follows with cardiology for history of CAD s/p MI 2012 with PCI to LAD and subsequent PCI to left circumflex, HTN, HLD, OSA on BiPAP.  Last cath October 2016 showed widely patent stents and no other CAD.  Seen by Dr. Bernie on 05/14/2024 for preop evaluation.  Per note, Cardiovascular evaluation before surgery.  He does have some symptoms concerning.  I discovered that he does not take aspirin  and I told him that he must take 1 baby aspirin  every single day, we will schedule him to have exercise Cardiolite, because of dyspnea on exertion echocardiogram will be done.  Nuclear stress 05/19/2024 showed normal perfusion, no evidence of ischemia or infarction, low risk.  Echo 06/09/2024 showed LVEF 60 to 65%, grade 1 DD, normal RV, no significant valvular abnormalities.  Other pertinent history includes former smoker, asthma/COPD (on Breo Ellipta , ipratropium, montelukast , and as needed albuterol ), GERD on PPI, venous insufficiency.  He was seen by PCP Dr. Velma Ku on 06/10/2024 for complaint of productive cough 1 week in duration.  He was treated for sinobronchitis with azithromycin  and prednisone .  At preadmission testing visit he denied any cough, shortness of breath, signs or symptoms of illness.  He reported he could go up 2 flights of steps without issue.  Preop labs reviewed, WBC mildly elevated at 12.1, otherwise WNL.  EKG 05/14/2024: NSR.  Rate 68.  Inferior infarct.  Cannot rule out anterior infarct.  TTE 06/09/2024:  1. Left ventricular ejection fraction, by estimation, is 60 to 65%. The  left ventricle has normal function. The left ventricle has no regional  wall motion abnormalities. Left ventricular diastolic parameters are  consistent with Grade I diastolic  dysfunction (impaired relaxation). The average left ventricular global  longitudinal strain is -20.0 %. The global longitudinal strain is normal.   2. Right ventricular systolic  function is normal. The right ventricular  size is normal.   3. The mitral valve is normal in structure. No evidence of mitral valve  regurgitation. No evidence of mitral stenosis.   4. The aortic valve is normal in structure. Aortic valve regurgitation is  trivial. No aortic stenosis is present.   5. The inferior vena cava is normal in size with greater than 50%  respiratory variability, suggesting right atrial pressure of 3 mmHg.   Nuclear stress 05/19/2024:   The study is normal. The study is low risk.   No ST deviation was noted.   LV perfusion is normal. There is no evidence of ischemia. There is no evidence of infarction.   Left ventricular function is normal. Nuclear stress EF: 66%. The left ventricular ejection fraction is hyperdynamic (>65%). End diastolic cavity size is normal. End systolic cavity size is normal.   CT images were obtained for attenuation correction and were examined for the presence of coronary calcium  when appropriate.   Coronary calcium  assessment not performed due to prior revascularization.   Prior study available for comparison from 05/17/2022.     Lynwood Geofm RIGGERS [facility] Short Stay Center/Anesthesiology Phone [phone] 06/19/2024 8:49 AM

## 2024-06-23 ENCOUNTER — Encounter (HOSPITAL_COMMUNITY): Payer: Self-pay | Admitting: Physician Assistant

## 2024-06-23 ENCOUNTER — Other Ambulatory Visit: Payer: Self-pay

## 2024-06-23 ENCOUNTER — Ambulatory Visit: Admit: 2024-06-23 | Payer: Self-pay | Admitting: Urology

## 2024-06-23 ENCOUNTER — Encounter (HOSPITAL_COMMUNITY): Admission: RE | Disposition: A | Payer: Self-pay | Source: Ambulatory Visit | Attending: Urology

## 2024-06-23 ENCOUNTER — Encounter (HOSPITAL_COMMUNITY): Payer: Self-pay | Admitting: Urology

## 2024-06-23 ENCOUNTER — Ambulatory Visit (HOSPITAL_COMMUNITY): Admitting: Anesthesiology

## 2024-06-23 ENCOUNTER — Observation Stay (HOSPITAL_COMMUNITY)
Admission: RE | Admit: 2024-06-23 | Discharge: 2024-06-24 | Disposition: A | Discharge status: Home / Self Care | Source: Ambulatory Visit | Attending: Urology | Admitting: Urology

## 2024-06-23 DIAGNOSIS — Z87891 Personal history of nicotine dependence: Secondary | ICD-10-CM | POA: Insufficient documentation

## 2024-06-23 DIAGNOSIS — J4489 Other specified chronic obstructive pulmonary disease: Secondary | ICD-10-CM | POA: Insufficient documentation

## 2024-06-23 DIAGNOSIS — I10 Essential (primary) hypertension: Secondary | ICD-10-CM | POA: Insufficient documentation

## 2024-06-23 DIAGNOSIS — N210 Calculus in bladder: Secondary | ICD-10-CM | POA: Diagnosis not present

## 2024-06-23 DIAGNOSIS — Z79899 Other long term (current) drug therapy: Secondary | ICD-10-CM | POA: Diagnosis not present

## 2024-06-23 DIAGNOSIS — I2510 Atherosclerotic heart disease of native coronary artery without angina pectoris: Secondary | ICD-10-CM | POA: Insufficient documentation

## 2024-06-23 DIAGNOSIS — Z955 Presence of coronary angioplasty implant and graft: Secondary | ICD-10-CM | POA: Diagnosis not present

## 2024-06-23 DIAGNOSIS — N401 Enlarged prostate with lower urinary tract symptoms: Principal | ICD-10-CM | POA: Insufficient documentation

## 2024-06-23 DIAGNOSIS — Z85828 Personal history of other malignant neoplasm of skin: Secondary | ICD-10-CM | POA: Insufficient documentation

## 2024-06-23 DIAGNOSIS — N138 Other obstructive and reflux uropathy: Secondary | ICD-10-CM | POA: Diagnosis not present

## 2024-06-23 HISTORY — PX: CYSTOSCOPY WITH LITHOLAPAXY: SHX1425

## 2024-06-23 HISTORY — PX: TRANSURETHRAL RESECTION OF PROSTATE: SHX73

## 2024-06-23 SURGERY — TURP (TRANSURETHRAL RESECTION OF PROSTATE)
Anesthesia: General

## 2024-06-23 MED ORDER — DIPHENHYDRAMINE HCL 12.5 MG/5ML PO ELIX: 12.5000 mg | ORAL_SOLUTION | Freq: Four times a day (QID) | ORAL | Status: DC | PRN

## 2024-06-23 MED ORDER — CIPROFLOXACIN IN D5W 400 MG/200ML IV SOLN
400.0000 mg | INTRAVENOUS | Status: AC
  Administered 2024-06-23: 400 mg via INTRAVENOUS
  Filled 2024-06-23: qty 200

## 2024-06-23 MED ORDER — CEFAZOLIN SODIUM-DEXTROSE 2-4 GM/100ML-% IV SOLN
INTRAVENOUS | Status: AC
  Filled 2024-06-23: qty 100

## 2024-06-23 MED ORDER — FENTANYL CITRATE (PF) 50 MCG/ML IJ SOSY
25.0000 ug | PREFILLED_SYRINGE | INTRAMUSCULAR | Status: DC | PRN
  Administered 2024-06-23 (×2): 50 ug via INTRAVENOUS

## 2024-06-23 MED ORDER — FLEET ENEMA RE ENEM: 1.0000 | ENEMA | Freq: Once | RECTAL | Status: DC | PRN

## 2024-06-23 MED ORDER — ACETAMINOPHEN 500 MG PO TABS: 1000.0000 mg | ORAL_TABLET | Freq: Once | ORAL | Status: AC

## 2024-06-23 MED ORDER — HYOSCYAMINE SULFATE 0.125 MG SL SUBL: 0.1250 mg | SUBLINGUAL_TABLET | SUBLINGUAL | Status: DC | PRN

## 2024-06-23 MED ORDER — SENNOSIDES-DOCUSATE SODIUM 8.6-50 MG PO TABS: 1.0000 | ORAL_TABLET | Freq: Every evening | ORAL | Status: DC | PRN

## 2024-06-23 MED ORDER — ORAL CARE MOUTH RINSE: 15.0000 mL | Freq: Once | OROMUCOSAL | Status: AC

## 2024-06-23 MED ORDER — OXYCODONE HCL 5 MG PO TABS: 5.0000 mg | ORAL_TABLET | ORAL | Status: DC | PRN

## 2024-06-23 MED ORDER — ACETAMINOPHEN 325 MG PO TABS: 650.0000 mg | ORAL_TABLET | ORAL | Status: DC | PRN

## 2024-06-23 MED ORDER — FINASTERIDE 5 MG PO TABS
5.0000 mg | ORAL_TABLET | Freq: Every day | ORAL | Status: DC
  Administered 2024-06-23: 5 mg via ORAL
  Filled 2024-06-23: qty 1

## 2024-06-23 MED ORDER — CLINDAMYCIN HCL 300 MG PO CAPS: 300.0000 mg | ORAL_CAPSULE | ORAL | Status: DC

## 2024-06-23 MED ORDER — HYDROMORPHONE HCL 1 MG/ML IJ SOLN: 0.5000 mg | INTRAMUSCULAR | Status: DC | PRN

## 2024-06-23 MED ORDER — EPHEDRINE SULFATE (PRESSORS) 25 MG/5ML IV SOSY
PREFILLED_SYRINGE | INTRAVENOUS | Status: DC | PRN
  Administered 2024-06-23: 5 mg via INTRAVENOUS
  Administered 2024-06-23: 10 mg via INTRAVENOUS

## 2024-06-23 MED ORDER — DEXAMETHASONE SOD PHOSPHATE PF 10 MG/ML IJ SOLN
INTRAMUSCULAR | Status: DC | PRN
  Administered 2024-06-23: 8 mg via INTRAVENOUS

## 2024-06-23 MED ORDER — ONDANSETRON HCL 4 MG/2ML IJ SOLN: 4.0000 mg | Freq: Once | INTRAMUSCULAR | Status: DC | PRN

## 2024-06-23 MED ORDER — PROPOFOL 10 MG/ML IV BOLUS
INTRAVENOUS | Status: DC | PRN
  Administered 2024-06-23: 150 mg via INTRAVENOUS
  Administered 2024-06-23: 50 mg via INTRAVENOUS

## 2024-06-23 MED ORDER — POTASSIUM CHLORIDE IN NACL 20-0.45 MEQ/L-% IV SOLN
INTRAVENOUS | Status: DC
  Administered 2024-06-23: 100 mL/h via INTRAVENOUS
  Filled 2024-06-23 (×4): qty 1000

## 2024-06-23 MED ORDER — FLUTICASONE FUROATE-VILANTEROL 200-25 MCG/ACT IN AEPB
1.0000 | INHALATION_SPRAY | Freq: Every day | RESPIRATORY_TRACT | Status: DC
  Administered 2024-06-24: 1 via RESPIRATORY_TRACT
  Filled 2024-06-23: qty 28

## 2024-06-23 MED ORDER — ROSUVASTATIN CALCIUM 20 MG PO TABS
40.0000 mg | ORAL_TABLET | Freq: Every day | ORAL | Status: DC
  Administered 2024-06-23 – 2024-06-24 (×2): 40 mg via ORAL
  Filled 2024-06-23 (×2): qty 2

## 2024-06-23 MED ORDER — PANTOPRAZOLE SODIUM 40 MG PO TBEC
80.0000 mg | DELAYED_RELEASE_TABLET | Freq: Every day | ORAL | Status: DC
  Administered 2024-06-24: 80 mg via ORAL
  Filled 2024-06-23: qty 2

## 2024-06-23 MED ORDER — PHENYLEPHRINE 80 MCG/ML (10ML) SYRINGE FOR IV PUSH (FOR BLOOD PRESSURE SUPPORT)
PREFILLED_SYRINGE | INTRAVENOUS | Status: DC | PRN
  Administered 2024-06-23 (×3): 160 ug via INTRAVENOUS

## 2024-06-23 MED ORDER — LISINOPRIL 5 MG PO TABS
5.0000 mg | ORAL_TABLET | Freq: Every day | ORAL | Status: DC
  Administered 2024-06-23 – 2024-06-24 (×2): 5 mg via ORAL
  Filled 2024-06-23 (×2): qty 1

## 2024-06-23 MED ORDER — BISACODYL 10 MG RE SUPP: 10.0000 mg | Freq: Every day | RECTAL | Status: DC | PRN

## 2024-06-23 MED ORDER — LIDOCAINE HCL (PF) 2 % IJ SOLN
INTRAMUSCULAR | Status: AC
  Filled 2024-06-23: qty 5

## 2024-06-23 MED ORDER — METOPROLOL TARTRATE 25 MG PO TABS
25.0000 mg | ORAL_TABLET | Freq: Two times a day (BID) | ORAL | Status: DC
  Administered 2024-06-23 – 2024-06-24 (×2): 25 mg via ORAL
  Filled 2024-06-23 (×2): qty 1

## 2024-06-23 MED ORDER — CHLORHEXIDINE GLUCONATE 0.12 % MT SOLN
15.0000 mL | Freq: Once | OROMUCOSAL | Status: AC
  Administered 2024-06-23: 15 mL via OROMUCOSAL

## 2024-06-23 MED ORDER — METOPROLOL TARTRATE 25 MG PO TABS
25.0000 mg | ORAL_TABLET | Freq: Once | ORAL | Status: AC
  Administered 2024-06-23: 25 mg via ORAL

## 2024-06-23 MED ORDER — LIDOCAINE HCL (CARDIAC) PF 100 MG/5ML IV SOSY
PREFILLED_SYRINGE | INTRAVENOUS | Status: DC | PRN
  Administered 2024-06-23: 80 mg via INTRAVENOUS

## 2024-06-23 MED ORDER — TRANEXAMIC ACID-NACL 1000-0.7 MG/100ML-% IV SOLN
1000.0000 mg | INTRAVENOUS | Status: AC
  Administered 2024-06-23: 1000 mg via INTRAVENOUS

## 2024-06-23 MED ORDER — FENTANYL CITRATE (PF) 50 MCG/ML IJ SOSY
PREFILLED_SYRINGE | INTRAMUSCULAR | Status: AC
  Filled 2024-06-23: qty 1

## 2024-06-23 MED ORDER — DIPHENHYDRAMINE HCL 50 MG/ML IJ SOLN: 12.5000 mg | Freq: Four times a day (QID) | INTRAMUSCULAR | Status: DC | PRN

## 2024-06-23 MED ORDER — OXYCODONE HCL 5 MG PO TABS
ORAL_TABLET | ORAL | Status: AC
  Filled 2024-06-23: qty 1

## 2024-06-23 MED ORDER — ONDANSETRON HCL 4 MG/2ML IJ SOLN
INTRAMUSCULAR | Status: AC
  Filled 2024-06-23: qty 2

## 2024-06-23 MED ORDER — DEXAMETHASONE SOD PHOSPHATE PF 10 MG/ML IJ SOLN
INTRAMUSCULAR | Status: AC
  Filled 2024-06-23: qty 1

## 2024-06-23 MED ORDER — KETOROLAC TROMETHAMINE 15 MG/ML IJ SOLN
15.0000 mg | Freq: Once | INTRAMUSCULAR | Status: AC
  Administered 2024-06-23: 15 mg via INTRAVENOUS

## 2024-06-23 MED ORDER — FENTANYL CITRATE (PF) 100 MCG/2ML IJ SOLN
INTRAMUSCULAR | Status: DC | PRN
  Administered 2024-06-23 (×2): 50 ug via INTRAVENOUS

## 2024-06-23 MED ORDER — TRANEXAMIC ACID-NACL 1000-0.7 MG/100ML-% IV SOLN
INTRAVENOUS | Status: AC
  Filled 2024-06-23: qty 100

## 2024-06-23 MED ORDER — ACETAMINOPHEN 10 MG/ML IV SOLN: 1000.0000 mg | Freq: Once | INTRAVENOUS | Status: DC | PRN

## 2024-06-23 MED ORDER — IPRATROPIUM BROMIDE 0.03 % NA SOLN: 2.0000 | Freq: Two times a day (BID) | NASAL | Status: DC | PRN

## 2024-06-23 MED ORDER — EZETIMIBE 10 MG PO TABS
10.0000 mg | ORAL_TABLET | Freq: Every day | ORAL | Status: DC
  Administered 2024-06-23: 10 mg via ORAL
  Filled 2024-06-23: qty 1

## 2024-06-23 MED ORDER — TRAMADOL HCL 50 MG PO TABS
50.0000 mg | ORAL_TABLET | Freq: Four times a day (QID) | ORAL | Status: DC | PRN
  Administered 2024-06-23 – 2024-06-24 (×2): 50 mg via ORAL
  Filled 2024-06-23 (×2): qty 1

## 2024-06-23 MED ORDER — ONDANSETRON HCL 4 MG/2ML IJ SOLN: 4.0000 mg | INTRAMUSCULAR | Status: DC | PRN

## 2024-06-23 MED ORDER — SODIUM CHLORIDE 0.9 % IR SOLN
Status: DC | PRN
  Administered 2024-06-23 (×8): 3000 mL via INTRAVESICAL

## 2024-06-23 MED ORDER — KETOROLAC TROMETHAMINE 15 MG/ML IJ SOLN
INTRAMUSCULAR | Status: AC
  Filled 2024-06-23: qty 1

## 2024-06-23 MED ORDER — ACETAMINOPHEN 500 MG PO TABS
ORAL_TABLET | ORAL | Status: AC
  Administered 2024-06-23: 1000 mg via ORAL
  Filled 2024-06-23: qty 2

## 2024-06-23 MED ORDER — STERILE WATER FOR IRRIGATION IR SOLN
Status: DC | PRN
  Administered 2024-06-23: 500 mL

## 2024-06-23 MED ORDER — SODIUM CHLORIDE 0.9 % IR SOLN: 3000.0000 mL | Status: DC

## 2024-06-23 MED ORDER — PHENYLEPHRINE 80 MCG/ML (10ML) SYRINGE FOR IV PUSH (FOR BLOOD PRESSURE SUPPORT)
PREFILLED_SYRINGE | INTRAVENOUS | Status: AC
  Filled 2024-06-23: qty 10

## 2024-06-23 MED ORDER — ALBUTEROL SULFATE (2.5 MG/3ML) 0.083% IN NEBU: 3.0000 mL | INHALATION_SOLUTION | Freq: Four times a day (QID) | RESPIRATORY_TRACT | Status: DC | PRN

## 2024-06-23 MED ORDER — ONDANSETRON HCL 4 MG/2ML IJ SOLN
INTRAMUSCULAR | Status: DC | PRN
  Administered 2024-06-23: 4 mg via INTRAVENOUS

## 2024-06-23 MED ORDER — PROPOFOL 10 MG/ML IV BOLUS
INTRAVENOUS | Status: AC
  Filled 2024-06-23: qty 20

## 2024-06-23 MED ORDER — OXYCODONE HCL 5 MG PO TABS
5.0000 mg | ORAL_TABLET | Freq: Once | ORAL | Status: AC
  Administered 2024-06-23: 5 mg via ORAL

## 2024-06-23 MED ORDER — FENTANYL CITRATE (PF) 100 MCG/2ML IJ SOLN
INTRAMUSCULAR | Status: AC
  Filled 2024-06-23: qty 2

## 2024-06-23 MED ORDER — AMISULPRIDE (ANTIEMETIC) 5 MG/2ML IV SOLN: 10.0000 mg | Freq: Once | INTRAVENOUS | Status: DC | PRN

## 2024-06-23 MED ORDER — LACTATED RINGERS IV SOLN: INTRAVENOUS | Status: DC

## 2024-06-23 MED ORDER — MONTELUKAST SODIUM 10 MG PO TABS
10.0000 mg | ORAL_TABLET | Freq: Every day | ORAL | Status: DC
  Administered 2024-06-23: 10 mg via ORAL
  Filled 2024-06-23: qty 1

## 2024-06-23 NOTE — TOC Initial Note (Signed)
 Transition of Care [facility]) - Initial/Assessment Note    Patient Details  Name: Alex Boyle MRN: [id number] Date of Birth: 1945-07-25  Transition of [facility]) CM/SW Contact:    Bascom Service, RN Phone Number: 06/23/2024, 2:50 PM  Clinical Narrative:d/c home. Has own transport home.                   Expected Discharge Plan: Home/Self Care Barriers to Discharge: Continued Medical Work up   Patient Goals and CMS Choice Patient states their goals for this hospitalization and ongoing recovery are:: Home CMS Medicare.gov Compare Post Acute Care list provided to:: Patient Choice offered to / list presented to : Patient [place] ownership interest in [facility].provided to:: Patient    Expected Discharge Plan and Services   Discharge Planning Services: CM Consult   Living arrangements for the past 2 months: Single Family Home                                      Prior Living Arrangements/Services Living arrangements for the past 2 months: Single Family Home Lives with:: Spouse                   Activities of Daily Living   ADL Screening (condition at time of admission) Independently performs ADLs?: Yes (appropriate for developmental age) Is the patient deaf or have difficulty hearing?: No Does the patient have difficulty seeing, even when wearing glasses/contacts?: No Does the patient have difficulty concentrating, remembering, or making decisions?: No  Permission Sought/Granted                  Emotional Assessment              Admission diagnosis:  Enlarged prostate with urinary retention [N40.1, R33.8] Bladder calculus [N21.0] BPH with obstruction/lower urinary tract symptoms [N40.1, N13.8] Patient Active Problem List   Diagnosis Date Noted   BPH with obstruction/lower urinary tract symptoms 06/23/2024   Sinobronchitis 06/10/2024   Sleep apnea in adult    Sleep apnea    Myocardial infarction (HCC)    Hypertension     Hyperlipidemia    Diverticulitis    COPD (chronic obstructive pulmonary disease) (HCC)    Arthritis    Allergy    History of kidney stones 04/22/2024   Gastroesophageal reflux disease with esophagitis without hemorrhage 11/12/2023   Radicular low back pain 10/01/2023   Healthcare maintenance 11/14/2020   History of colon polyps 11/14/2020   Barrett's esophagus with dysplasia 11/14/2020   Neuropathy 11/14/2020   Asthma exacerbation 04/22/2019   Venous insufficiency 01/08/2019   Numbness and tingling of both feet 01/08/2019   History of basal cell cancer 01/08/2019   Hyperlipidemia LDL goal <70 10/13/2018   Essential hypertension 09/23/2018   CAD (coronary artery disease) 09/23/2018   Asthma 09/23/2018   OSA (obstructive sleep apnea) 09/23/2018   Benign prostatic hyperplasia with lower urinary tract symptoms 09/23/2018   PCP:  Lowella Benton CROME, PA Pharmacy:   [facility] [address] [place], [place] - [address] [place] [address] Phone: [phone] Fax: [phone]     Social Drivers of Health (SDOH) Social History: SDOH Screenings   Food Insecurity: No Food Insecurity (06/23/2024)  Housing: Low Risk (06/23/2024)  Transportation Needs: No Transportation Needs (06/23/2024)  Utilities: Not At Risk (06/23/2024)  Alcohol Screen: Low Risk (  04/18/2024)  Depression (PHQ2-9): Low Risk (04/22/2024)  Financial Resource Strain: Low Risk (04/18/2024)  Physical Activity: Insufficiently Active (04/18/2024)  Social Connections: Moderately Integrated (06/23/2024)  Stress: No Stress Concern Present (04/18/2024)  Tobacco Use: Medium Risk (06/23/2024)   SDOH Interventions:     Readmission Risk Interventions     No data to display

## 2024-06-23 NOTE — Anesthesia Procedure Notes (Signed)
 Procedure Name: LMA Insertion Date/Time: 06/23/2024 7:41 AM  Performed by: Deeann Eva BROCKS, CRNAPre-anesthesia Checklist: Patient identified, Emergency Drugs available, Suction available and Patient being monitored Patient Re-evaluated:Patient Re-evaluated prior to induction Oxygen Delivery Method: Circle System Utilized Preoxygenation: Pre-oxygenation with 100% oxygen Induction Type: IV induction Ventilation: Mask ventilation without difficulty LMA: LMA with gastric port inserted LMA Size: 4.0 Number of attempts: 1 Airway Equipment and Method: Bite block Placement Confirmation: positive ETCO2 Tube secured with: Tape Dental Injury: Teeth and Oropharynx as per pre-operative assessment

## 2024-06-23 NOTE — Op Note (Signed)
 Procedure: 1.  Cystolitholapaxy less than 2.5 cm stone. 2.  Transurethral resection of the prostate.  Pre-op diagnosis: 1.  Multiple bladder stones with largest approximately 12 mm. 2.  BPH with bladder outlet obstruction.  Postop diagnosis: Same.  Surgeon: Dr. Norleen Seltzer.  Anesthesia: General.  Specimen: 1.  Stone fragments for analysis. 2.  TUR chips to pathology.  Drains: 22 French three-way Foley catheter.  EBL: 200 mL.  Complications: None.  Indications: Patient is a 79 year old male who has BPH and bladder outlet obstruction and multiple bladder stones who is elected undergo cystolitholapaxy and TURP.  Procedure: He was taken the operating room where he was given antibiotics.  He was given a gram of TXA.  A general anesthetic was induced.  He was placed in lithotomy position and was fitted with PAS hose.  He was prepped with Betadine solution and draped in usual sterile fashion.  Cystoscopy was performed using a 21 French scope and 30 degree lens.  Examination revealed a normal urethra.  The external sphincter was intact.  The prostatic urethra was approximately 3 cm in length with bilobar hyperplasia with coaptation obstruction.  The bladder neck was elevated but there was no middle lobe.  Examination of bladder demonstrated multiple tan bladder stones concerning for uric acid with the largest approximately 1.2 cm.  There were about 4 that required laser fragmentation.  The urethra was calibrated to 30 French with R.r. Donnelley sounds and the continuous-flow resectoscope sheath was passed with the aid of the visual obturator.  The greenlight laser bridge was then placed.  There was 1 approximately 6 mm stone that I was able to evacuate and then there were multiple small stones that were evacuated.  The remaining stones were fragmented using the Moses laser and the 910 m fiber with the laser set on the lithotripsy setting.  Eventually I did increase the power on the right pedal to 2 J  and reduced the rate to 5 Hz.  The left pedal was left on 0.8 J and 8 Hz.  Once the stones were adequately fragmented they were evacuated from the bladder and collected for analysis.  I then switched to the [facility] handle which was fitted with a bipolar loop and a 30 degree lens.  Saline was the irrigant.  The prostate was then resected starting at the bladder neck which was resected down to the bladder neck fibers from 5-7 o'clock.  The floor of the prostate was then resected out to alongside the verumontanum bilaterally.  The left lobe of the prostate was resected from bladder neck to apex out to the capsular fibers.  This was then followed by the right lobe.  Chips were evacuated as the procedure progressed and additional anterior and apical tissue was removed as needed to ensure a widely patent TUR channel.  Once an adequate resection had been performed, the bladder was evacuated free of chips and final hemostasis was achieved.  Final inspection demonstrated intact ureteral orifice ease, no retained tissue or stones, no active bleeding and an intact external sphincter.  The scope was removed and pressure on the bladder produced an excellent stream.  A 22 French three-way Foley catheter was placed with the aid of a catheter guide.  The balloon was filled with 30 mL of sterile fluid.  The cath was hand irrigated with clear return and placed to continuous irrigation and straight drainage.  The prostate chips were gathered and sent to pathology.  He was taken down from lithotomy position,  his anesthetic was reversed and he was moved to recovery in stable condition.  There were no complications.

## 2024-06-23 NOTE — Transfer of Care (Signed)
 Immediate Anesthesia Transfer of Care Note  Patient: Alex Boyle  Procedure(s) Performed: TURP (TRANSURETHRAL RESECTION OF PROSTATE) CYSTOSCOPY, WITH BLADDER CALCULUS LITHOLAPAXY, LASER LITHOTRIPSY  Patient Location: PACU  Anesthesia Type:General  Level of Consciousness: awake and alert   Airway & Oxygen Therapy: Patient Spontanous Breathing and Patient connected to face mask oxygen  Post-op Assessment: Report given to RN and Post -op Vital signs reviewed and stable  Post vital signs: Reviewed  Last Vitals:  Vitals Value Taken Time  BP 113/69 06/23/24 09:06  Temp    Pulse 64 06/23/24 09:09  Resp 11 06/23/24 09:09  SpO2 99 % 06/23/24 09:09  Vitals shown include unfiled device data.  Last Pain:  Vitals:   06/23/24 0532  TempSrc: Oral  PainSc: 0-No pain         Complications: No notable events documented.

## 2024-06-23 NOTE — Interval H&P Note (Signed)
 History and Physical Interval Note:  No change.  06/23/2024 7:22 AM  Alex Boyle  has presented today for surgery, with the diagnosis of BENIGN PROSTATIC HYPERPLASIA AND BLADDER STONE.  The various methods of treatment have been discussed with the patient and family. After consideration of risks, benefits and other options for treatment, the patient has consented to  Procedures: TURP (TRANSURETHRAL RESECTION OF PROSTATE) (N/A) CYSTOSCOPY, WITH BLADDER CALCULUS LITHOLAPAXY (N/A) as a surgical intervention.  The patient's history has been reviewed, patient examined, no change in status, stable for surgery.  I have reviewed the patient's chart and labs.  Questions were answered to the patient's satisfaction.     Dasja Brase

## 2024-06-24 ENCOUNTER — Encounter (HOSPITAL_COMMUNITY): Payer: Self-pay | Admitting: Urology

## 2024-06-24 DIAGNOSIS — N210 Calculus in bladder: Secondary | ICD-10-CM | POA: Diagnosis present

## 2024-06-24 DIAGNOSIS — N401 Enlarged prostate with lower urinary tract symptoms: Secondary | ICD-10-CM | POA: Diagnosis not present

## 2024-06-24 LAB — SURGICAL PATHOLOGY

## 2024-06-24 NOTE — Progress Notes (Signed)
 Discharge instructions reviewed with patient, verbalized understanding. All questions answered. All belongings accounted for. Patient to follow up with MD in  1-2 weeks. PIV removed.   Needs to urinate one more time before discharge per primary RN

## 2024-06-24 NOTE — Anesthesia Postprocedure Evaluation (Signed)
"   Anesthesia Post Note  Patient: Alex Boyle  Procedure(s) Performed: TURP (TRANSURETHRAL RESECTION OF PROSTATE) CYSTOSCOPY, WITH BLADDER CALCULUS LITHOLAPAXY, LASER LITHOTRIPSY     Patient location during evaluation: PACU Anesthesia Type: General Level of consciousness: awake Pain management: pain level controlled Vital Signs Assessment: post-procedure vital signs reviewed and stable Respiratory status: spontaneous breathing, nonlabored ventilation and respiratory function stable Cardiovascular status: blood pressure returned to baseline and stable Postop Assessment: no apparent nausea or vomiting Anesthetic complications: no   No notable events documented.  Last Vitals:  Vitals:   06/24/24 0600 06/24/24 0735  BP: 121/71   Pulse: 67   Resp: 18   Temp: 36.6 C   SpO2: 97% 95%    Last Pain:  Vitals:   06/24/24 0600  TempSrc: Oral  PainSc:                  Jacqulyn Barresi P Ramin Zoll      "

## 2024-06-25 NOTE — Discharge Summary (Signed)
 Physician Discharge Summary  Patient ID: Alex Boyle MRN: [id number] DOB/AGE: 01/19/1946 79 y.o.  Admit date: 06/23/2024 Discharge date: 06/25/2024  Admission Diagnoses:  BPH with obstruction/lower urinary tract symptoms  Discharge Diagnoses:  Principal Problem:   BPH with obstruction/lower urinary tract symptoms Active Problems:   Bladder stones   Past Medical History:  Diagnosis Date   Allergy    Arthritis    Asthma    Cancer (HCC)    skin cancers   COPD (chronic obstructive pulmonary disease) (HCC)    boderline per pt   Diverticulitis    History of kidney stones    Hyperlipidemia    Hypertension    Myocardial infarction [facility])    Peripheral vascular disease    Pneumonia    Sleep apnea    uses bipap   Sleep apnea in adult    Venous insufficiency 01/08/2019    Surgeries: Procedures: TURP (TRANSURETHRAL RESECTION OF PROSTATE) CYSTOSCOPY, WITH BLADDER CALCULUS LITHOLAPAXY, LASER LITHOTRIPSY on 06/23/2024   Consultants (if any):   Discharged Condition: Improved  Hospital Course: Alex Boyle is an 79 y.o. male who was admitted 06/23/2024 with a diagnosis of BPH with obstruction/lower urinary tract symptoms and went to the operating room on 06/23/2024 and underwent the above named procedures.  He did well and had the foley removed pod#1.  He was discharged when voiding.   He was given perioperative antibiotics:  Anti-infectives (From admission, onward)    Start     Dose/Rate Route Frequency Ordered Stop   06/23/24 1422  clindamycin  (CLEOCIN ) capsule 300-600 mg  Status:  Discontinued       Note to Pharmacy: TAKE TWO CAPSULES BY MOUTH 30 MINUTES PRIOR TO SURGERY, THEN TAKE ONE CAPSULE BY MOUTH THREE TIMES DAILY FOR 5 DAYS     300-600 mg Oral See admin instructions 06/23/24 1423 06/23/24 1438   06/23/24 0528  ceFAZolin  (ANCEF ) 2-4 GM/100ML-% IVPB       Note to Pharmacy: Viann Perkins M: cabinet override      06/23/24 0528 06/23/24 1744   06/23/24 0525   ciprofloxacin  (CIPRO ) IVPB 400 mg        400 mg 200 mL/hr over 60 Minutes Intravenous 60 min pre-op 06/23/24 0525 06/23/24 0730     .  He was given sequential compression devices, for DVT prophylaxis.  He benefited maximally from the hospital stay and there were no complications.    Inpatient Morphine Milligram Equivalents Per Day 1/13 - 1/14   Values displayed are in units of MME/Day    Order Start / End Date 1/13 Yesterday    fentaNYL  (SUBLIMAZE ) injection 25-50 mcg 1/13 - 1/13 30 of 45-90 --    oxyCODONE  (Oxy IR/ROXICODONE ) immediate release tablet 5 mg 1/13 - 1/13 0 of 7.5 --    HYDROmorphone  (DILAUDID ) injection 0.5-1 mg 1/13 - 1/14 0 of 50-100 0 of 80-160    fentaNYL  (SUBLIMAZE ) injection 1/13 - 1/13 *30 of 30 --    oxyCODONE  (Oxy IR/ROXICODONE ) immediate release tablet 5 mg 1/13 - 1/13 7.5 of 7.5 --    traMADol  (ULTRAM ) tablet 50 mg 1/13 - 1/14 5 of 10 5 of 15    Daily Totals  * 72.5 of 150-245 5 of 95-175  *One-Step medication     Recent vital signs:  Vitals:   06/24/24 0600 06/24/24 0735  BP: 121/71   Pulse: 67   Resp: 18   Temp: 97.9 F (36.6 C)   SpO2: 97% 95%    Recent  laboratory studies:  Lab Results  Component Value Date   HGB 16.1 06/18/2024   HGB 16.0 04/22/2024   HGB 15.8 12/27/2023   Lab Results  Component Value Date   WBC 12.1 (H) 06/18/2024   PLT 265 06/18/2024   Lab Results  Component Value Date   INR 1.3 (H) 01/29/2024   Lab Results  Component Value Date   NA 138 06/18/2024   K 3.8 06/18/2024   CL 99 06/18/2024   CO2 30 06/18/2024   BUN 15 06/18/2024   CREATININE 0.88 06/18/2024   GLUCOSE 99 06/18/2024    Discharge Medications:   Allergies as of 06/24/2024       Reactions   Codeine Shortness Of Breath   Gramineae Pollens Itching   Penicillins Swelling        Medication List     TAKE these medications    albuterol  108 (90 Base) MCG/ACT inhaler Commonly known as: VENTOLIN  HFA Inhale 1-2 puffs into the lungs every 6  (six) hours as needed for wheezing or shortness of breath.   Cholecalciferol 25 MCG (1000 UT) capsule Take 1,000 Units by mouth daily.   clindamycin  150 MG capsule Commonly known as: CLEOCIN  Take 150 mg by mouth as needed (Dental procedure).   clindamycin  300 MG capsule Commonly known as: CLEOCIN  Take 300-600 mg by mouth See admin instructions. TAKE TWO CAPSULES BY MOUTH 30 MINUTES PRIOR TO SURGERY, THEN TAKE ONE CAPSULE BY MOUTH THREE TIMES DAILY FOR 5 DAYS   Diclofenac -miSOPROStol  75-0.2 MG Tbec Take 1 tablet by mouth in the morning and at bedtime.   ezetimibe  10 MG tablet Commonly known as: ZETIA  Take 1 tablet (10 mg total) by mouth daily.   finasteride  5 MG tablet Commonly known as: PROSCAR  Take 1qd (Plz sched appt with new provider for future fills)   fluticasone  furoate-vilanterol 200-25 MCG/ACT Aepb Commonly known as: BREO ELLIPTA  INHALE ONE PUFF BY MOUTH ONE TIME DAILY   ipratropium 0.03 % nasal spray Commonly known as: ATROVENT  Place 2 sprays into both nostrils every 12 (twelve) hours. What changed:  when to take this reasons to take this   lisinopril  5 MG tablet Commonly known as: ZESTRIL  Take 1 tablet (5 mg total) by mouth daily.   metoprolol  tartrate 25 MG tablet Commonly known as: LOPRESSOR  Take 1 tablet (25 mg total) by mouth 2 (two) times daily.   montelukast  10 MG tablet Commonly known as: SINGULAIR  TAKE ONE TABLET BY MOUTH AT BEDTIME   MULTIVITAMIN ADULTS 50+ PO Take 1 tablet by mouth daily.   nitroGLYCERIN  0.4 MG SL tablet Commonly known as: NITROSTAT  Place 0.4 mg under the tongue every 5 (five) minutes as needed for chest pain.   omeprazole  40 MG capsule Commonly known as: PRILOSEC  Take 1 capsule (40 mg total) by mouth daily.   rosuvastatin  40 MG tablet Commonly known as: CRESTOR  Take 1 tablet (40 mg total) by mouth daily. 1rst attempt, patient needs and appt for additional refills   VITAMIN-B COMPLEX PO Take 1 tablet by mouth daily.         Diagnostic Studies: ECHOCARDIOGRAM COMPLETE Result Date: 06/09/2024    ECHOCARDIOGRAM REPORT   Patient Name:   Alex Boyle Date of Exam: 06/09/2024 Medical Rec #:  [id number]            Height:       72.0 in Accession #:    [id number]           Weight:  205.0 lb Date of Birth:  27-Jun-1945            BSA:          2.153 m Patient Age:    55 years             BP:           108/70 mmHg Patient Gender: M                    HR:           59 bpm. Exam Location:  [place] Procedure: 2D Echo, Cardiac Doppler, Color Doppler and Strain Analysis (Both            Spectral and Color Flow Doppler were utilized during procedure). Indications:    R06.9 DOE  History:        Patient has prior history of Echocardiogram examinations, most                 recent 06/18/2022. CAD and Previous Myocardial Infarction,                 Arrythmias:PAC, Signs/Symptoms:Dyspnea; Risk                 Factors:Dyslipidemia, Former Smoker, Sleep Apnea and                 Hypertension.  Sonographer:    Alan Greenhouse RDMS, RVT, RDCS Referring Phys: [phone] LAMAR PARAS KRASOWSKI IMPRESSIONS  1. Left ventricular ejection fraction, by estimation, is 60 to 65%. The left ventricle has normal function. The left ventricle has no regional wall motion abnormalities. Left ventricular diastolic parameters are consistent with Grade I diastolic dysfunction (impaired relaxation). The average left ventricular global longitudinal strain is -20.0 %. The global longitudinal strain is normal.  2. Right ventricular systolic function is normal. The right ventricular size is normal.  3. The mitral valve is normal in structure. No evidence of mitral valve regurgitation. No evidence of mitral stenosis.  4. The aortic valve is normal in structure. Aortic valve regurgitation is trivial. No aortic stenosis is present.  5. The inferior vena cava is normal in size with greater than 50% respiratory variability, suggesting right atrial pressure of 3 mmHg.  Comparison(s): Echocardiogram done 06/18/22 showed an EF of 60-65%.  FINDINGS  Left Ventricle: Left ventricular ejection fraction, by estimation, is 60 to 65%. The left ventricle has normal function. The left ventricle has no regional wall motion abnormalities. The average left ventricular global longitudinal strain is -20.0 %. Strain was performed and the global longitudinal strain is normal. The left ventricular internal cavity size was normal in size. There is no left ventricular hypertrophy. Left ventricular diastolic parameters are consistent with Grade I diastolic dysfunction (impaired relaxation). Right Ventricle: The right ventricular size is normal. No increase in right ventricular wall thickness. Right ventricular systolic function is normal. Left Atrium: Left atrial size was normal in size. Right Atrium: Right atrial size was normal in size. Pericardium: There is no evidence of pericardial effusion. Mitral Valve: The mitral valve is normal in structure. No evidence of mitral valve regurgitation. No evidence of mitral valve stenosis. Tricuspid Valve: The tricuspid valve is normal in structure. Tricuspid valve regurgitation is not demonstrated. No evidence of tricuspid stenosis. Aortic Valve: The aortic valve is normal in structure. Aortic valve regurgitation is trivial. No aortic stenosis is present. Aortic valve mean gradient measures 4.0 mmHg. Aortic valve peak gradient measures 7.6 mmHg. Aortic valve area, by  VTI measures 2.47 cm. Pulmonic Valve: The pulmonic valve was normal in structure. Pulmonic valve regurgitation is not visualized. No evidence of pulmonic stenosis. Aorta: The aortic root is normal in size and structure. Venous: The inferior vena cava is normal in size with greater than 50% respiratory variability, suggesting right atrial pressure of 3 mmHg. IAS/Shunts: No atrial level shunt detected by color flow Doppler.  LEFT VENTRICLE PLAX 2D LVIDd:         4.90 cm     Diastology LVIDs:          2.90 cm     LV e' medial:    4.00 cm/s LV PW:         1.10 cm     LV E/e' medial:  12.6 LV IVS:        0.80 cm     LV e' lateral:   3.69 cm/s LVOT diam:     2.10 cm     LV E/e' lateral: 13.7 LV SV:         68 LV SV Index:   32          2D Longitudinal Strain LVOT Area:     3.46 cm    2D Strain GLS (A4C):   -20.4 %                            2D Strain GLS (A3C):   -19.7 %                            2D Strain GLS (A2C):   -20.0 % LV Volumes (MOD)           2D Strain GLS Avg:     -20.0 % LV vol d, MOD A2C: 59.4 ml LV vol d, MOD A4C: 57.3 ml LV vol s, MOD A2C: 19.3 ml LV vol s, MOD A4C: 14.2 ml LV SV MOD A2C:     40.1 ml LV SV MOD A4C:     57.3 ml LV SV MOD BP:      43.1 ml RIGHT VENTRICLE RV S prime:     15.80 cm/s  PULMONARY VEINS TAPSE (M-mode): 2.1 cm      Diastolic Velocity: 25.00 cm/s                             S/D Velocity:       1.30                             Systolic Velocity:  33.10 cm/s LEFT ATRIUM             Index        RIGHT ATRIUM           Index LA diam:        3.60 cm 1.67 cm/m   RA Area:     15.40 cm LA Vol (A2C):   36.3 ml 16.86 ml/m  RA Volume:   40.80 ml  18.95 ml/m LA Vol (A4C):   26.9 ml 12.49 ml/m LA Biplane Vol: 31.8 ml 14.77 ml/m  AORTIC VALVE AV Area (Vmax):    2.19 cm AV Area (Vmean):   2.45 cm AV Area (VTI):     2.47 cm AV Vmax:           138.00  cm/s AV Vmean:          86.200 cm/s AV VTI:            0.276 m AV Peak Grad:      7.6 mmHg AV Mean Grad:      4.0 mmHg LVOT Vmax:         87.10 cm/s LVOT Vmean:        60.900 cm/s LVOT VTI:          0.197 m LVOT/AV VTI ratio: 0.71 AR Vena Contracta: 0.20 cm  AORTA Ao Root diam: 4.00 cm Ao Asc diam:  3.80 cm MITRAL VALVE               TRICUSPID VALVE MV Area (PHT): 2.22 cm    TR Peak grad:   16.8 mmHg MV Decel Time: 341 msec    TR Vmax:        205.00 cm/s MV E velocity: 50.40 cm/s MV A velocity: 97.30 cm/s  SHUNTS MV E/A ratio:  0.52        Systemic VTI:  0.20 m                            Systemic Diam: 2.10 cm Jennifer Crape MD  Electronically signed by Jennifer Crape MD Signature Date/Time: 06/09/2024/2:05:25 PM    Final     Disposition: Discharge disposition: 01-Home or Self Care       Discharge Instructions     Discontinue IV   Complete by: As directed         Follow-up Information     Nicholaus Sherlyn CROME, NP Follow up on 07/07/2024.   Why: [id number] Contact information: [address] [place] [address] [phone]                  Signed: Norleen Seltzer 06/25/2024, 6:09 PM

## 2024-07-03 LAB — STONE ANALYSIS
Calcium Oxalate Dihydrate: 40 %
Calcium Oxalate Monohydrate: 55 %
Calcium Phosphate (Hydroxyl): 5 %
Weight Calculi: 392 mg

## 2024-07-04 ENCOUNTER — Ambulatory Visit: Payer: Self-pay | Admitting: Urgent Care

## 2024-07-07 ENCOUNTER — Other Ambulatory Visit: Payer: Self-pay | Admitting: Cardiology

## 2024-07-07 DIAGNOSIS — I10 Essential (primary) hypertension: Secondary | ICD-10-CM

## 2024-07-09 NOTE — Progress Notes (Unsigned)
" ° °       Established patient visit   History of Present Illness   Discussed the use of AI scribe software for clinical note transcription with the patient, who gave verbal consent to proceed.  History of Present Illness           Physical Exam   Physical Exam Assessment & Plan   Problem List Items Addressed This Visit   None Visit Diagnoses       Rash    -  Primary      Assessment and Plan             Follow up   No follow-ups on file. __________________________________ Zada FREDRIK Palin, DNP, APRN, FNP-BC Primary Care and Sports Medicine [facility] [place] "

## 2024-07-10 ENCOUNTER — Ambulatory Visit: Admitting: Medical-Surgical

## 2024-07-10 DIAGNOSIS — R21 Rash and other nonspecific skin eruption: Secondary | ICD-10-CM

## 2024-07-22 ENCOUNTER — Ambulatory Visit: Admitting: Pulmonary Disease

## 2024-08-20 ENCOUNTER — Ambulatory Visit: Admitting: Rheumatology

## 2024-08-20 ENCOUNTER — Ambulatory Visit: Admitting: Urgent Care

## 2024-08-24 ENCOUNTER — Ambulatory Visit: Admitting: Urgent Care

## 2024-09-29 ENCOUNTER — Ambulatory Visit: Admitting: Rheumatology
